# Patient Record
Sex: Female | Born: 1945 | Race: White | Hispanic: No | Smoking: Never smoker
Health system: Southern US, Community
[De-identification: ages and names within clinical notes are randomized; demographics above are authoritative.]

## PROBLEM LIST (undated history)

## (undated) DIAGNOSIS — Z9889 Other specified postprocedural states: Secondary | ICD-10-CM

## (undated) DIAGNOSIS — R42 Dizziness and giddiness: Secondary | ICD-10-CM

## (undated) DIAGNOSIS — I878 Other specified disorders of veins: Secondary | ICD-10-CM

## (undated) DIAGNOSIS — E785 Hyperlipidemia, unspecified: Secondary | ICD-10-CM

## (undated) DIAGNOSIS — R112 Nausea with vomiting, unspecified: Secondary | ICD-10-CM

## (undated) DIAGNOSIS — M1990 Unspecified osteoarthritis, unspecified site: Secondary | ICD-10-CM

## (undated) DIAGNOSIS — I10 Essential (primary) hypertension: Secondary | ICD-10-CM

## (undated) DIAGNOSIS — E039 Hypothyroidism, unspecified: Secondary | ICD-10-CM

## (undated) DIAGNOSIS — I639 Cerebral infarction, unspecified: Secondary | ICD-10-CM

## (undated) HISTORY — PX: VEIN SURGERY: SHX48

## (undated) HISTORY — PX: FINGER SURGERY: SHX640

## (undated) HISTORY — DX: Other specified disorders of veins: I87.8

## (undated) HISTORY — PX: TUBAL LIGATION: SHX77

## (undated) HISTORY — PX: CHOLECYSTECTOMY: SHX55

## (undated) HISTORY — DX: Dizziness and giddiness: R42

## (undated) HISTORY — PX: MIDDLE EAR SURGERY: SHX713

## (undated) HISTORY — PX: OTHER SURGICAL HISTORY: SHX169

## (undated) HISTORY — DX: Hyperlipidemia, unspecified: E78.5

---

## 1998-01-21 ENCOUNTER — Encounter: Admission: RE | Admit: 1998-01-21 | Discharge: 1998-04-21 | Payer: Self-pay

## 1998-10-08 ENCOUNTER — Other Ambulatory Visit: Admission: RE | Admit: 1998-10-08 | Discharge: 1998-10-08 | Payer: Self-pay | Admitting: Family Medicine

## 1998-10-09 ENCOUNTER — Encounter: Admission: RE | Admit: 1998-10-09 | Discharge: 1999-01-07 | Payer: Self-pay

## 1999-10-09 ENCOUNTER — Other Ambulatory Visit: Admission: RE | Admit: 1999-10-09 | Discharge: 1999-10-09 | Payer: Self-pay | Admitting: Family Medicine

## 2001-06-21 ENCOUNTER — Other Ambulatory Visit: Admission: RE | Admit: 2001-06-21 | Discharge: 2001-06-21 | Payer: Self-pay | Admitting: Family Medicine

## 2002-08-15 ENCOUNTER — Other Ambulatory Visit: Admission: RE | Admit: 2002-08-15 | Discharge: 2002-08-15 | Payer: Self-pay | Admitting: Family Medicine

## 2003-08-21 ENCOUNTER — Other Ambulatory Visit: Admission: RE | Admit: 2003-08-21 | Discharge: 2003-08-21 | Payer: Self-pay | Admitting: Family Medicine

## 2004-12-02 ENCOUNTER — Other Ambulatory Visit: Admission: RE | Admit: 2004-12-02 | Discharge: 2004-12-02 | Payer: Self-pay | Admitting: *Deleted

## 2006-03-26 ENCOUNTER — Other Ambulatory Visit: Admission: RE | Admit: 2006-03-26 | Discharge: 2006-03-26 | Payer: Self-pay | Admitting: Family Medicine

## 2010-04-25 DIAGNOSIS — R42 Dizziness and giddiness: Secondary | ICD-10-CM

## 2010-04-25 DIAGNOSIS — M1990 Unspecified osteoarthritis, unspecified site: Secondary | ICD-10-CM

## 2010-04-25 DIAGNOSIS — M179 Osteoarthritis of knee, unspecified: Secondary | ICD-10-CM | POA: Insufficient documentation

## 2010-04-29 ENCOUNTER — Ambulatory Visit: Payer: Self-pay | Admitting: Gastroenterology

## 2011-01-20 ENCOUNTER — Observation Stay (HOSPITAL_COMMUNITY)
Admission: EM | Admit: 2011-01-20 | Discharge: 2011-01-21 | Disposition: A | Discharge status: Home / Self Care | Payer: BC Managed Care – PPO | Source: Ambulatory Visit | Attending: Emergency Medicine | Admitting: Emergency Medicine

## 2011-01-20 ENCOUNTER — Emergency Department (HOSPITAL_COMMUNITY): Payer: BC Managed Care – PPO

## 2011-01-20 DIAGNOSIS — R079 Chest pain, unspecified: Principal | ICD-10-CM | POA: Insufficient documentation

## 2011-01-20 LAB — DIFFERENTIAL
Monocytes Absolute: 0.3 10*3/uL (ref 0.1–1.0)
Monocytes Relative: 5 % (ref 3–12)
Neutrophils Relative %: 79 % — ABNORMAL HIGH (ref 43–77)

## 2011-01-20 LAB — BASIC METABOLIC PANEL
BUN: 13 mg/dL (ref 6–23)
Calcium: 8.6 mg/dL (ref 8.4–10.5)
GFR calc non Af Amer: >60 mL/min (ref >60)
Glucose, Bld: 95 mg/dL (ref 70–99)

## 2011-01-20 LAB — CBC
HCT: 41 % (ref 36.0–46.0)
Hemoglobin: 14.1 g/dL (ref 12.0–15.0)
MCH: 28.5 pg (ref 26.0–34.0)
MCHC: 34.4 g/dL (ref 30.0–36.0)
MCV: 82.8 fL (ref 78.0–100.0)

## 2011-01-21 DIAGNOSIS — R072 Precordial pain: Secondary | ICD-10-CM

## 2011-01-21 LAB — CK TOTAL AND CKMB (NOT AT ARMC)
CK, MB: 1.8 ng/mL (ref 0.3–4.0)
CK, MB: 1.9 ng/mL (ref 0.3–4.0)
Relative Index: 1.5 (ref 0.0–2.5)
Total CK: 125 U/L (ref 7–177)
Total CK: 131 U/L (ref 7–177)

## 2011-05-18 ENCOUNTER — Other Ambulatory Visit: Payer: BC Managed Care – PPO | Admitting: Gastroenterology

## 2012-10-07 ENCOUNTER — Other Ambulatory Visit: Payer: Self-pay | Admitting: Nurse Practitioner

## 2012-10-10 ENCOUNTER — Telehealth: Payer: Self-pay | Admitting: *Deleted

## 2012-10-10 NOTE — Telephone Encounter (Signed)
LMOM, Tramadol rx ready.

## 2012-10-10 NOTE — Telephone Encounter (Signed)
LAST REFILL 08/16/12. LAST OV 9/13 WITH DFS. CALL IN TO [facility]. LAST DIRECTIONS IN CHART SAYS QD

## 2012-11-22 ENCOUNTER — Ambulatory Visit: Payer: Self-pay | Admitting: Nurse Practitioner

## 2012-12-14 ENCOUNTER — Ambulatory Visit (INDEPENDENT_AMBULATORY_CARE_PROVIDER_SITE_OTHER): Payer: BC Managed Care – PPO | Admitting: General Practice

## 2012-12-14 ENCOUNTER — Encounter: Payer: Self-pay | Admitting: General Practice

## 2012-12-14 VITALS — BP 154/71 | HR 89 | Temp 97.2°F | Ht 65.5 in | Wt 214.0 lb

## 2012-12-14 DIAGNOSIS — R030 Elevated blood-pressure reading, without diagnosis of hypertension: Secondary | ICD-10-CM

## 2012-12-14 DIAGNOSIS — Z09 Encounter for follow-up examination after completed treatment for conditions other than malignant neoplasm: Secondary | ICD-10-CM

## 2012-12-14 NOTE — Progress Notes (Signed)
  Subjective:    Patient ID: Kimberly Walsh, female    DOB: 01/23/1946, 67 y.o.   MRN: [id number]  HPI Patient presents today for blood pressure check. She reports being stressed due to her spouse is in the hospital. Her blood pressure was checked at the hospital and it was 210/90. She reported feeling dizzy at that time. She reports taking lasix daily. Reports feeling more tired and weak.  She reports numbness periodically in hands when in non dependent position. She denies discoloration. She reports taking medications as directed.    Review of Systems  Constitutional: Negative for fever and chills.  HENT: Negative for ear pain, neck pain and neck stiffness.   Respiratory: Negative for chest tightness and shortness of breath.   Cardiovascular: Positive for leg swelling. Negative for chest pain and palpitations.       Right lower leg swelling that is being treated by vein specialist in [facility]. She reports have a vein stripping two years ago and leg began swelling afterwards  Gastrointestinal: Negative for vomiting, abdominal pain, diarrhea and blood in stool.  Genitourinary: Negative for dysuria, flank pain, difficulty urinating and pelvic pain.  Musculoskeletal: Positive for back pain.       Taking mobic for musculoskeletal pain  Neurological: Negative for dizziness, weakness, numbness and headaches.       Objective:   Physical Exam  Constitutional: She is oriented to person, place, and time. She appears well-developed and well-nourished.  HENT:  Head: Normocephalic and atraumatic.  Eyes: EOM are normal.  Cardiovascular: Normal rate, regular rhythm and normal heart sounds.   Pulses:      Radial pulses are 2+ on the right side, and 2+ on the left side.       Dorsalis pedis pulses are 1+ on the right side, and 1+ on the left side.  Pulmonary/Chest: Effort normal and breath sounds normal. No respiratory distress. She exhibits no tenderness.  Abdominal: Soft. Bowel sounds are  normal. She exhibits no distension. There is no tenderness.  Neurological: She is alert and oriented to person, place, and time.  Skin: Skin is warm and dry.  Psychiatric: She has a normal mood and affect.          Assessment & Plan:  1. Blood pressure elevated - COMPLETE METABOLIC PANEL WITH GFR; Future  2. Follow-up exam, 3-6 months since previous exam - POCT CBC; Future - Vitamin D 25 hydroxy; Future - NMR Lipoprofile with Lipids; Future - Thyroid Panel With TSH; Future - Vitamin B12; Future Continue all current medications Labs pending  Keep blood pressure diary (record readings) and bring into office F/u in 3 months and sooner if symptoms worsen or develop Discussed exercise and diet  Discussed adequate rest Patient verbalized understanding Coralie Keens, FNP-C

## 2012-12-14 NOTE — Patient Instructions (Signed)

## 2012-12-15 ENCOUNTER — Other Ambulatory Visit (INDEPENDENT_AMBULATORY_CARE_PROVIDER_SITE_OTHER): Payer: BC Managed Care – PPO

## 2012-12-15 DIAGNOSIS — R030 Elevated blood-pressure reading, without diagnosis of hypertension: Secondary | ICD-10-CM

## 2012-12-15 DIAGNOSIS — Z09 Encounter for follow-up examination after completed treatment for conditions other than malignant neoplasm: Secondary | ICD-10-CM

## 2012-12-15 LAB — POCT CBC
Hemoglobin: 14.7 g/dL (ref 12.2–16.2)
Lymph, poc: 1.4 (ref 0.6–3.4)
MCHC: 35.2 g/dL (ref 31.8–35.4)
MPV: 6.7 fL (ref 0–99.8)
POC Granulocyte: 3.9 (ref 2–6.9)
Platelet Count, POC: 205 10*3/uL (ref 142–424)
RBC: 4.9 M/uL (ref 4.04–5.48)

## 2012-12-15 LAB — THYROID PANEL WITH TSH
Free Thyroxine Index: 2.2 (ref 1.0–3.9)
T3 Uptake: 34.3 % (ref 22.5–37.0)
T4, Total: 6.3 ug/dL (ref 5.0–12.5)
TSH: 4.455 u[IU]/mL (ref 0.350–4.500)

## 2012-12-15 LAB — COMPLETE METABOLIC PANEL WITH GFR
ALT: 18 U/L (ref 0–35)
AST: 18 U/L (ref 0–37)
CO2: 28 mEq/L (ref 19–32)
GFR, Est African American: 65 mL/min
Sodium: 141 mEq/L (ref 135–145)
Total Bilirubin: 0.7 mg/dL (ref 0.3–1.2)
Total Protein: 6.7 g/dL (ref 6.0–8.3)

## 2012-12-15 NOTE — Progress Notes (Unsigned)
Patient came in for labs only.

## 2012-12-16 LAB — NMR LIPOPROFILE WITH LIPIDS
Cholesterol, Total: 147 mg/dL (ref <200)
HDL Particle Number: 25.6 umol/L — ABNORMAL LOW (ref >=30.5)
HDL-C: 40 mg/dL (ref >=40)
LDL (calc): 87 mg/dL (ref <100)
LDL Particle Number: 1084 nmol/L — ABNORMAL HIGH (ref <1000)
LP-IR Score: 40 (ref <=45)
Triglycerides: 98 mg/dL (ref <150)
VLDL Size: 42.3 nm (ref <=46.6)

## 2013-01-25 ENCOUNTER — Other Ambulatory Visit: Payer: Self-pay

## 2013-01-26 DIAGNOSIS — R42 Dizziness and giddiness: Secondary | ICD-10-CM

## 2013-01-27 ENCOUNTER — Other Ambulatory Visit: Payer: Self-pay | Admitting: *Deleted

## 2013-01-27 MED ORDER — FUROSEMIDE 40 MG PO TABS: 40.0000 mg | ORAL_TABLET | Freq: Every day | ORAL | Status: DC

## 2013-01-28 ENCOUNTER — Other Ambulatory Visit: Payer: Self-pay | Admitting: Nurse Practitioner

## 2013-03-29 ENCOUNTER — Other Ambulatory Visit: Payer: Self-pay | Admitting: Nurse Practitioner

## 2013-03-31 NOTE — Telephone Encounter (Signed)
Last seen 12/14/12, last filled 10/07/12

## 2013-04-30 ENCOUNTER — Other Ambulatory Visit: Payer: Self-pay | Admitting: General Practice

## 2013-05-01 ENCOUNTER — Other Ambulatory Visit: Payer: Self-pay

## 2013-05-01 NOTE — Telephone Encounter (Signed)
Last seen 12/14/12  [name]

## 2013-05-01 NOTE — Telephone Encounter (Signed)
Last seen 12/14/12  [name]  If approved print and route to nurse

## 2013-05-09 ENCOUNTER — Encounter: Payer: Self-pay | Admitting: General Practice

## 2013-05-09 ENCOUNTER — Ambulatory Visit (INDEPENDENT_AMBULATORY_CARE_PROVIDER_SITE_OTHER): Payer: BC Managed Care – PPO | Admitting: General Practice

## 2013-05-09 VITALS — BP 185/92 | HR 90 | Temp 97.1°F | Ht 65.5 in | Wt 216.0 lb

## 2013-05-09 DIAGNOSIS — M5432 Sciatica, left side: Secondary | ICD-10-CM

## 2013-05-09 DIAGNOSIS — Z1211 Encounter for screening for malignant neoplasm of colon: Secondary | ICD-10-CM

## 2013-05-09 DIAGNOSIS — Z124 Encounter for screening for malignant neoplasm of cervix: Secondary | ICD-10-CM

## 2013-05-09 DIAGNOSIS — Z0000 Encounter for general adult medical examination without abnormal findings: Secondary | ICD-10-CM

## 2013-05-09 DIAGNOSIS — M5430 Sciatica, unspecified side: Secondary | ICD-10-CM

## 2013-05-09 DIAGNOSIS — E785 Hyperlipidemia, unspecified: Secondary | ICD-10-CM

## 2013-05-09 DIAGNOSIS — I10 Essential (primary) hypertension: Secondary | ICD-10-CM

## 2013-05-09 DIAGNOSIS — Z01419 Encounter for gynecological examination (general) (routine) without abnormal findings: Secondary | ICD-10-CM

## 2013-05-09 DIAGNOSIS — Z23 Encounter for immunization: Secondary | ICD-10-CM

## 2013-05-09 DIAGNOSIS — M1990 Unspecified osteoarthritis, unspecified site: Secondary | ICD-10-CM

## 2013-05-09 LAB — POCT CBC
Granulocyte percent: 67.8 %G (ref 37–80)
Lymph, poc: 2 (ref 0.6–3.4)
MCHC: 32.4 g/dL (ref 31.8–35.4)
MCV: 85.1 fL (ref 80–97)
MPV: 6.3 fL (ref 0–99.8)
POC Granulocyte: 4.5 (ref 2–6.9)
POC LYMPH PERCENT: 30 %L (ref 10–50)
Platelet Count, POC: 238 10*3/uL (ref 142–424)
RBC: 5.3 M/uL (ref 4.04–5.48)
RDW, POC: 13.6 %
WBC: 6.6 10*3/uL (ref 4.6–10.2)

## 2013-05-09 MED ORDER — METHYLPREDNISOLONE ACETATE 80 MG/ML IJ SUSP
80.0000 mg | Freq: Once | INTRAMUSCULAR | Status: AC
  Administered 2013-05-09: 80 mg via INTRAMUSCULAR

## 2013-05-09 MED ORDER — DICLOFENAC SODIUM 1 % TD GEL: 2.0000 g | Freq: Four times a day (QID) | TRANSDERMAL | Status: DC

## 2013-05-09 MED ORDER — TRAMADOL HCL 50 MG PO TABS: 50.0000 mg | ORAL_TABLET | Freq: Two times a day (BID) | ORAL | Status: DC | PRN

## 2013-05-09 MED ORDER — LISINOPRIL 10 MG PO TABS: 10.0000 mg | ORAL_TABLET | Freq: Every day | ORAL | Status: DC

## 2013-05-09 NOTE — Progress Notes (Signed)
Subjective:    Patient ID: Kimberly Walsh, female    DOB: 10/03/1945, 67 y.o.   MRN: [id number]  HPI Patient presents today for annual physical. She reports she has arthritis and was seen a doctor in [place], but needs a referral. She also reports having pain in left hip that radiates to left thigh. She also reports being seen by an ortho two years ago. Reports having mammogram scheduled and would like to schedule colonoscopy.  Patient's blood pressure has been elevated for last two office visits and she denies taking anti hypertensive medications. Kimberly Walsh is having left hip pain in left hip, that radiates down to left thigh.    Review of Systems  Constitutional: Negative for fever and chills.  Respiratory: Negative for chest tightness and shortness of breath.   Cardiovascular: Negative for chest pain and palpitations.  Gastrointestinal: Negative for nausea, vomiting, abdominal pain, diarrhea, constipation and blood in stool.  Genitourinary: Negative for dysuria and difficulty urinating.  Musculoskeletal:       Left hip pain that runs to thigh Arthritic pain in hands  Neurological: Negative for dizziness, weakness and headaches.       Objective:   Physical Exam  Constitutional: She is oriented to person, place, and time. She appears well-developed and well-nourished.  HENT:  Head: Normocephalic and atraumatic.  Right Ear: External ear normal.  Left Ear: External ear normal.  Mouth/Throat: Oropharynx is clear and moist.  Eyes: Conjunctivae and EOM are normal. Pupils are equal, round, and reactive to light.  Neck: Normal range of motion. Neck supple. No thyromegaly present.  Cardiovascular: Normal rate, regular rhythm, normal heart sounds and intact distal pulses.   Pulmonary/Chest: Effort normal and breath sounds normal. No respiratory distress. Right breast exhibits no inverted nipple, no mass, no nipple discharge, no skin change and no tenderness. Left breast exhibits no  inverted nipple, no mass, no nipple discharge, no skin change and no tenderness. Breasts are symmetrical.  Abdominal: Soft. Bowel sounds are normal. She exhibits no distension.  Genitourinary: Vagina normal and uterus normal. No breast swelling, tenderness, discharge or bleeding. No labial fusion. There is no rash, tenderness, lesion or injury on the right labia. There is no rash, tenderness, lesion or injury on the left labia. Uterus is not deviated, not enlarged, not fixed and not tender. Cervix exhibits no motion tenderness, no discharge and no friability. Right adnexum displays no mass, no tenderness and no fullness. Left adnexum displays no mass, no tenderness and no fullness. No erythema, tenderness or bleeding around the vagina. No foreign body around the vagina. No signs of injury around the vagina. No vaginal discharge found.  Musculoskeletal: She exhibits tenderness.  Tenderness to left low back and left hip with palpation Knuckle subluxation to bilateral hands  Lymphadenopathy:    She has no cervical adenopathy.  Neurological: She is alert and oriented to person, place, and time.  Skin: Skin is warm and dry.  Psychiatric: She has a normal mood and affect.          Assessment & Plan:  1. Need for prophylactic vaccination against Streptococcus pneumoniae (pneumococcus)  - Pneumococcal conjugate vaccine 13-valent IM  2. Annual physical exam  - POCT CBC - Pap IG w/ reflex to HPV when ASC-U  3. Hyperlipidemia  - NMR, lipoprofile  4. Hypertension  - CMP14+EGFR - lisinopril (PRINIVIL,ZESTRIL) 10 MG tablet; Take 1 tablet (10 mg total) by mouth daily.  Dispense: 30 tablet; Refill: 1  5. Sciatica, left  - methylPREDNISolone  acetate (DEPO-MEDROL) injection 80 mg; Inject 1 mL (80 mg total) into the muscle once. - Ambulatory referral to Orthopedic Surgery  6. Arthritis  - traMADol (ULTRAM) 50 MG tablet; Take 1 tablet (50 mg total) by mouth every 12 (twelve) hours as needed.   Dispense: 60 tablet; Refill: 0  7. Osteoarthritis, hand, unspecified laterality  - diclofenac sodium (VOLTAREN) 1 % GEL; Apply 2 g topically 4 (four) times daily.  Dispense: 1 Tube; Refill: 3  8. Colon cancer screening  - Ambulatory referral to Gastroenterology -Continue all current medications Labs pending F/u in 2 weeks for bp recheck Discussed exercise and diet  RTO if symptoms worsen  Patient verbalized understanding Coralie Keens, FNP-C

## 2013-05-09 NOTE — Patient Instructions (Addendum)
Pap Test A Pap test is a procedure done in a clinic office to evaluate cells that are on the surface of the cervix. The cervix is the lower portion of the uterus and upper portion of the vagina. For some women, the cervical region has the potential to form cancer. With consistent evaluations by your caregiver, this type of cancer can be prevented.  If a Pap test is abnormal, it is most often a result of a previous exposure to human papillomavirus (HPV). HPV is a virus that can infect the cells of the cervix and cause dysplasia. Dysplasia is where the cells no longer look normal. If a woman has been diagnosed with high-grade or severe dysplasia, they are at higher risk of developing cervical cancer. People diagnosed with low-grade dysplasia should still be seen by their caregiver because there is a small chance that low-grade dysplasia could develop into cancer.  LET YOUR CAREGIVER KNOW ABOUT:  Recent sexually transmitted infection (STI) you have had.  Any new sex partners you have had.  History of previous abnormal Pap tests results.  History of previous cervical procedures you have had (colposcopy, biopsy, loop electrosurgical excision procedure [LEEP]).  Concerns you have had regarding unusual vaginal discharge.  History of pelvic pain.  Your use of birth control. BEFORE THE PROCEDURE  Ask your caregiver when to schedule your Pap test. It is best not to be on your period if your caregiver uses a wooden spatula to collect cells or applies cells to a glass slide. Newer techniques are not so sensitive to the timing of a menstrual cycle.  Do not douche or have sexual intercourse for 24 hours before the test.   Do not use vaginal creams or tampons for 24 hours before the test.   Empty your bladder just before the test to lessen any discomfort.  PROCEDURE You will lie on an exam table with your feet in stirrups. A warm metal or plastic instrument (speculum) is placed in your vagina. This  instrument allows your caregiver to see the inside of your vagina and look at your cervix. A small, plastic brush or wooden spatula is then used to collect cervical cells. These cells are placed in a lab specimen container. The cells are looked at under a microscope. A specialist will determine if the cells are normal.  AFTER THE PROCEDURE Make sure to get your test results.If your results come back abnormal, you may need further testing.  Document Released: 08/29/2002 Document Revised: 08/31/2011 Document Reviewed: 06/04/2011 [facility] Patient Information 2014 [place], [place]. Pneumococcal Vaccine, Polyvalent suspension for injection What is this medicine? PNEUMOCOCCAL VACCINE, POLYVALENT (NEU mo KOK al vak SEEN, pol ee VEY luhnt) is a vaccine to prevent pneumococcus bacteria infection. These bacteria are a major cause of ear infections, 'Strep throat' infections, and serious pneumonia, meningitis, or blood infections worldwide. These vaccines help the body to produce antibodies (protective substances) that help your body defend against these bacteria. This vaccine is recommended for infants and young children. This vaccine will not treat an infection. This medicine may be used for other purposes; ask your health care provider or pharmacist if you have questions. COMMON BRAND NAME(S): Prevnar 13 , Prevnar What should I tell my health care provider before I take this medicine? They need to know if you have any of these conditions: -bleeding problems -fever -immune system problems -low platelet count in the blood -seizures -an unusual or allergic reaction to pneumococcal vaccine, diphtheria toxoid, other vaccines, latex, other medicines, foods,  dyes, or preservatives -pregnant or trying to get pregnant -breast-feeding How should I use this medicine? This vaccine is for injection into a muscle. It is given by a health care professional. A copy of Vaccine Information Statements will be given  before each vaccination. Read this sheet carefully each time. The sheet may change frequently. Talk to your pediatrician regarding the use of this medicine in children. While this drug may be prescribed for children as young as 64 weeks old for selected conditions, precautions do apply. Overdosage: If you think you have taken too much of this medicine contact a poison control center or emergency room at once. NOTE: This medicine is only for you. Do not share this medicine with others. What if I miss a dose? It is important not to miss your dose. Call your doctor or health care professional if you are unable to keep an appointment. What may interact with this medicine? -medicines for cancer chemotherapy -medicines that suppress your immune function -medicines that treat or prevent blood clots like warfarin, enoxaparin, and dalteparin -steroid medicines like prednisone or cortisone This list may not describe all possible interactions. Give your health care provider a list of all the medicines, herbs, non-prescription drugs, or dietary supplements you use. Also tell them if you smoke, drink alcohol, or use illegal drugs. Some items may interact with your medicine. What should I watch for while using this medicine? Mild fever and pain should go away in 3 days or less. Report any unusual symptoms to your doctor or health care professional. What side effects may I notice from receiving this medicine? Side effects that you should report to your doctor or health care professional as soon as possible: -allergic reactions like skin rash, itching or hives, swelling of the face, lips, or tongue -breathing problems -confused -fever over 102 degrees F -pain, tingling, numbness in the hands or feet -seizures -unusual bleeding or bruising -unusual muscle weakness Side effects that usually do not require medical attention (report to your doctor or health care professional if they continue or are  bothersome): -aches and pains -diarrhea -fever of 102 degrees F or less -headache -irritable -loss of appetite -pain, tender at site where injected -trouble sleeping This list may not describe all possible side effects. Call your doctor for medical advice about side effects. You may report side effects to FDA at 1-800-FDA-1088. Where should I keep my medicine? This does not apply. This vaccine is given in a clinic, pharmacy, doctor's office, or other health care setting and will not be stored at home. NOTE: This sheet is a summary. It may not cover all possible information. If you have questions about this medicine, talk to your doctor, pharmacist, or health care provider.  2014, Elsevier/Gold Standard. (2008-08-21 10:17:22)

## 2013-05-10 LAB — CMP14+EGFR
ALT: 27 IU/L (ref 0–32)
AST: 19 IU/L (ref 0–40)
Albumin/Globulin Ratio: 1.3 (ref 1.1–2.5)
BUN/Creatinine Ratio: 16 (ref 11–26)
BUN: 14 mg/dL (ref 8–27)
Calcium: 9.4 mg/dL (ref 8.6–10.2)
Chloride: 103 mmol/L (ref 97–108)
Creatinine, Ser: 0.88 mg/dL (ref 0.57–1.00)
GFR calc Af Amer: 79 mL/min/{1.73_m2} (ref >59)
GFR calc non Af Amer: 69 mL/min/{1.73_m2} (ref >59)
Globulin, Total: 3.1 g/dL (ref 1.5–4.5)
Glucose: 100 mg/dL — ABNORMAL HIGH (ref 65–99)
Potassium: 4 mmol/L (ref 3.5–5.2)
Sodium: 141 mmol/L (ref 134–144)
Total Bilirubin: 0.5 mg/dL (ref 0.0–1.2)
Total Protein: 7 g/dL (ref 6.0–8.5)

## 2013-05-10 LAB — NMR, LIPOPROFILE
HDL Cholesterol by NMR: 38 mg/dL — ABNORMAL LOW (ref >=40)
LDL Size: 20.3 nm — ABNORMAL LOW (ref >20.5)
LDLC SERPL CALC-MCNC: 85 mg/dL (ref <100)
Small LDL Particle Number: 909 nmol/L — ABNORMAL HIGH (ref <=527)

## 2013-05-12 LAB — PAP IG W/ RFLX HPV ASCU: PAP Smear Comment: 0

## 2013-05-16 ENCOUNTER — Other Ambulatory Visit: Payer: Self-pay | Admitting: General Practice

## 2013-05-16 DIAGNOSIS — E785 Hyperlipidemia, unspecified: Secondary | ICD-10-CM

## 2013-05-16 MED ORDER — ATORVASTATIN CALCIUM 10 MG PO TABS: 10.0000 mg | ORAL_TABLET | Freq: Every day | ORAL | Status: DC

## 2013-05-23 ENCOUNTER — Encounter: Payer: Self-pay | Admitting: General Practice

## 2013-05-23 ENCOUNTER — Telehealth: Payer: Self-pay | Admitting: *Deleted

## 2013-05-23 ENCOUNTER — Ambulatory Visit (INDEPENDENT_AMBULATORY_CARE_PROVIDER_SITE_OTHER): Payer: BC Managed Care – PPO | Admitting: General Practice

## 2013-05-23 VITALS — BP 157/74 | HR 91 | Temp 97.3°F | Ht 65.5 in | Wt 211.0 lb

## 2013-05-23 DIAGNOSIS — N760 Acute vaginitis: Secondary | ICD-10-CM

## 2013-05-23 DIAGNOSIS — Z23 Encounter for immunization: Secondary | ICD-10-CM

## 2013-05-23 DIAGNOSIS — M129 Arthropathy, unspecified: Secondary | ICD-10-CM

## 2013-05-23 DIAGNOSIS — M1990 Unspecified osteoarthritis, unspecified site: Secondary | ICD-10-CM

## 2013-05-23 DIAGNOSIS — I10 Essential (primary) hypertension: Secondary | ICD-10-CM

## 2013-05-23 MED ORDER — NEBIVOLOL HCL 10 MG PO TABS: 10.0000 mg | ORAL_TABLET | Freq: Every day | ORAL | Status: DC

## 2013-05-23 MED ORDER — MICONAZOLE NITRATE 2 % VA CREA: 1.0000 | TOPICAL_CREAM | Freq: Every day | VAGINAL | Status: AC

## 2013-05-23 NOTE — Patient Instructions (Signed)

## 2013-05-23 NOTE — Progress Notes (Signed)
   Subjective:    Patient ID: SHARONLEE NINE, female    DOB: 04/27/1946, 67 y.o.   MRN: [id number]  HPI Patient presents today for follow up on blood pressure. Reports lisinopril made her cough. Denies checking blood pressure at home. Patient would like to be referred to a rheumatologist for arthritis in hands.     Review of Systems  Constitutional: Negative for fever and chills.  Respiratory: Negative for chest tightness and shortness of breath.   Cardiovascular: Negative for chest pain and palpitations.  Gastrointestinal: Negative for nausea, vomiting, abdominal pain, diarrhea, constipation and blood in stool.  Genitourinary: Negative for dysuria and difficulty urinating.  Neurological: Negative for dizziness, weakness and headaches.       Objective:   Physical Exam  Constitutional: She is oriented to person, place, and time. She appears well-developed and well-nourished.  Cardiovascular: Normal rate, regular rhythm and normal heart sounds.   Pulmonary/Chest: Effort normal and breath sounds normal.  Neurological: She is alert and oriented to person, place, and time.  Skin: Skin is warm and dry.  Psychiatric: She has a normal mood and affect.          Assessment & Plan:  1. Arthritis -rheumatologist referal  2. Hypertension -bystolic 10 mg daily (lot # [id number], 14 tablets) -discontinue lisinopril -RTO in 1 week for blood pressure recheck  3. Vaginitis and vulvovaginitis -monistat 7 (2%), apply vaginal every night for 7 nights -RTO if symptoms worsen or unresolved -Patient verbalized understanding Coralie Keens, FNP-C

## 2013-05-23 NOTE — Telephone Encounter (Signed)
Pt aware of lab results at her office visit.

## 2013-06-01 ENCOUNTER — Other Ambulatory Visit: Payer: Self-pay | Admitting: General Practice

## 2013-06-06 ENCOUNTER — Ambulatory Visit (INDEPENDENT_AMBULATORY_CARE_PROVIDER_SITE_OTHER): Payer: BC Managed Care – PPO | Admitting: Family Medicine

## 2013-06-06 ENCOUNTER — Telehealth: Payer: Self-pay | Admitting: Family Medicine

## 2013-06-06 VITALS — BP 123/64 | HR 61 | Temp 97.4°F | Ht 66.0 in | Wt 214.0 lb

## 2013-06-06 DIAGNOSIS — R252 Cramp and spasm: Secondary | ICD-10-CM

## 2013-06-06 DIAGNOSIS — J069 Acute upper respiratory infection, unspecified: Secondary | ICD-10-CM

## 2013-06-06 DIAGNOSIS — I10 Essential (primary) hypertension: Secondary | ICD-10-CM

## 2013-06-06 MED ORDER — BENZONATATE 100 MG PO CAPS: 100.0000 mg | ORAL_CAPSULE | Freq: Three times a day (TID) | ORAL | Status: DC | PRN

## 2013-06-06 MED ORDER — NEBIVOLOL HCL 10 MG PO TABS: 10.0000 mg | ORAL_TABLET | Freq: Every day | ORAL | Status: DC

## 2013-06-06 MED ORDER — AZITHROMYCIN 250 MG PO TABS: ORAL_TABLET | ORAL | Status: DC

## 2013-06-06 MED ORDER — CARBIDOPA-LEVODOPA ER 50-200 MG PO TBCR: 1.0000 | EXTENDED_RELEASE_TABLET | Freq: Two times a day (BID) | ORAL | Status: DC

## 2013-06-06 NOTE — Progress Notes (Signed)
   Subjective:    Patient ID: Kimberly Walsh, female    DOB: September 16, 1945, 67 y.o.   MRN: [id number]  HPI This 67 y.o. female presents for evaluation of c/o cramps in LE's at hs and URI sx's. She recently was placed on bystolic for hypertension and she is tolerating it well.   Review of Systems    No chest pain, SOB, HA, dizziness, vision change, N/V, diarrhea, constipation, dysuria, urinary urgency or frequency, myalgias, arthralgias or rash.  Objective:   Physical Exam  Vital signs noted  Well developed well nourished female.  HEENT - Head atraumatic Normocephalic                Eyes - PERRLA, Conjuctiva - clear Sclera- Clear EOMI                Ears - EAC's Wnl TM's Wnl Gross Hearing WNL                Nose - Nares patent                 Throat - oropharanx wnl Respiratory - Lungs CTA bilateral Cardiac - RRR S1 and S2 without murmur GI - Abdomen soft Nontender and bowel sounds active x 4 Extremities - No edema. Neuro - Grossly intact.      Assessment & Plan:  Hypertension - Plan: nebivolol (BYSTOLIC) 10 MG tablet  Cramps, extremity - Plan: carbidopa-levodopa (SINEMET CR) 50-200 MG per tablet  URI, acute - Plan: azithromycin (ZITHROMAX) 250 MG tablet, benzonatate (TESSALON PERLES) 100 MG capsule   Deatra Canter FNP

## 2013-06-06 NOTE — Progress Notes (Signed)
Returned for b/p rck 123/64, P- 61. Pt still continues with cough. Cough no better since switching B/P med. Now leg cramps since starting new Bystolic. Please advise.

## 2013-06-07 ENCOUNTER — Other Ambulatory Visit: Payer: Self-pay | Admitting: Family Medicine

## 2013-06-07 NOTE — Telephone Encounter (Signed)
She doesn't have a cough due to bp medicine.  She has a cough due to her URI and I don't want to switch. Follow up if she still has questions

## 2013-06-20 ENCOUNTER — Telehealth: Payer: Self-pay | Admitting: Family Medicine

## 2013-06-20 NOTE — Telephone Encounter (Signed)
error 

## 2013-06-21 NOTE — Telephone Encounter (Signed)
Did you mean to call in Sinimet for her? Please advise and contact pharmacy to clarify

## 2013-06-26 ENCOUNTER — Other Ambulatory Visit: Payer: Self-pay

## 2013-06-26 DIAGNOSIS — M1990 Unspecified osteoarthritis, unspecified site: Secondary | ICD-10-CM

## 2013-06-26 NOTE — Telephone Encounter (Signed)
Last seen 06/06/13  [facility]  If approved print and route to nurse

## 2013-06-27 ENCOUNTER — Other Ambulatory Visit: Payer: Self-pay | Admitting: Family Medicine

## 2013-06-27 ENCOUNTER — Other Ambulatory Visit: Payer: Self-pay | Admitting: General Practice

## 2013-06-27 ENCOUNTER — Telehealth: Payer: Self-pay | Admitting: General Practice

## 2013-06-27 DIAGNOSIS — R252 Cramp and spasm: Secondary | ICD-10-CM

## 2013-06-27 DIAGNOSIS — I10 Essential (primary) hypertension: Secondary | ICD-10-CM

## 2013-06-27 MED ORDER — TRAMADOL HCL 50 MG PO TABS: 50.0000 mg | ORAL_TABLET | Freq: Two times a day (BID) | ORAL | Status: DC | PRN

## 2013-06-27 MED ORDER — CARBIDOPA-LEVODOPA ER 50-200 MG PO TBCR: EXTENDED_RELEASE_TABLET | ORAL | Status: DC

## 2013-06-27 MED ORDER — NEBIVOLOL HCL 10 MG PO TABS: 10.0000 mg | ORAL_TABLET | Freq: Every day | ORAL | Status: DC

## 2013-06-27 NOTE — Telephone Encounter (Signed)
bystolic script sent to pharmacy

## 2013-07-06 ENCOUNTER — Encounter: Payer: Self-pay | Admitting: Family Medicine

## 2013-08-03 ENCOUNTER — Other Ambulatory Visit: Payer: Self-pay | Admitting: Family Medicine

## 2013-08-04 NOTE — Telephone Encounter (Signed)
Please advise 

## 2013-08-24 ENCOUNTER — Other Ambulatory Visit: Payer: Self-pay | Admitting: Family Medicine

## 2013-08-24 NOTE — Telephone Encounter (Signed)
Last seen 06/06/13  [facility]

## 2013-08-24 NOTE — Telephone Encounter (Signed)
Last seen 06/06/13  [facility]  If approved print and route to nurse

## 2013-09-01 ENCOUNTER — Telehealth: Payer: Self-pay | Admitting: Family Medicine

## 2013-09-04 ENCOUNTER — Telehealth: Payer: Self-pay | Admitting: General Practice

## 2013-09-04 NOTE — Telephone Encounter (Signed)
error 

## 2013-09-06 ENCOUNTER — Ambulatory Visit: Payer: BC Managed Care – PPO | Admitting: Family Medicine

## 2013-09-06 ENCOUNTER — Ambulatory Visit (INDEPENDENT_AMBULATORY_CARE_PROVIDER_SITE_OTHER): Payer: BC Managed Care – PPO | Admitting: General Practice

## 2013-09-06 ENCOUNTER — Ambulatory Visit (INDEPENDENT_AMBULATORY_CARE_PROVIDER_SITE_OTHER): Payer: BC Managed Care – PPO

## 2013-09-06 ENCOUNTER — Encounter: Payer: Self-pay | Admitting: General Practice

## 2013-09-06 VITALS — BP 162/76 | HR 84 | Temp 97.5°F | Ht 66.0 in | Wt 220.2 lb

## 2013-09-06 DIAGNOSIS — M129 Arthropathy, unspecified: Secondary | ICD-10-CM

## 2013-09-06 DIAGNOSIS — M79641 Pain in right hand: Secondary | ICD-10-CM

## 2013-09-06 DIAGNOSIS — M79642 Pain in left hand: Secondary | ICD-10-CM

## 2013-09-06 DIAGNOSIS — M542 Cervicalgia: Secondary | ICD-10-CM

## 2013-09-06 DIAGNOSIS — M79609 Pain in unspecified limb: Secondary | ICD-10-CM

## 2013-09-06 DIAGNOSIS — I10 Essential (primary) hypertension: Secondary | ICD-10-CM

## 2013-09-06 DIAGNOSIS — M1990 Unspecified osteoarthritis, unspecified site: Secondary | ICD-10-CM

## 2013-09-06 DIAGNOSIS — Z8489 Family history of other specified conditions: Secondary | ICD-10-CM

## 2013-09-06 MED ORDER — NEBIVOLOL HCL 10 MG PO TABS
10.0000 mg | ORAL_TABLET | Freq: Every day | ORAL | Status: DC
Start: 2013-09-06 — End: 2014-02-04

## 2013-09-06 MED ORDER — TRAMADOL HCL 50 MG PO TABS: 50.0000 mg | ORAL_TABLET | Freq: Every day | ORAL | Status: DC | PRN

## 2013-09-06 NOTE — Patient Instructions (Signed)
Headache and Arthritis °Headaches and arthritis are common problems. This causes an interest in the possible role of arthritis in causing headaches. Several major forms of arthritis exist. Two of the most common types are: °· Rheumatoid arthritis. °· Osteoarthritis. °Rheumatoid arthritis may begin at any age. It is a condition in which the body attacks some of its own tissues, thinking they do not belong. This leads to destruction of the bony areas around the joints. This condition may afflict any of the body's joints. It usually produces a deformity of the joint. The hands and fingers no longer appear straight but often appear angled towards one side. In some cases, the spine may be involved. Most often it is the vertebrae of the neck (cervicalspine). The areas of the neck most commonly afflicted by rheumatoid arthritis are the first and second cervical vertebrae. Curiously, rheumatoid arthritis, though it often produces severe deformities, is not always painful.  °The more common form of arthritis is osteoarthritis. It is a wear-and-tear form of arthritis. It usually does not produce deformity of the joints or destruction of the bony tissues. Rather the ligaments weaken. They may be calcified due to the body's attempt to heal the damage. The larger joints of the body and those joints that take the most stress and strain are the most often affected. In the neck region this osteoarthritis usually involves the fifth, sixth and seventh vertebrae. This is because the effects of posture produce the most fatigue on them. Osteoarthritis is often more painful than rheumatoid arthritis.  °During workups for arthritis, a test evaluating inflammation, (the sedimentation rate) often is performed. In rheumatoid arthritis, this test will usually be elevated. Other tests for inflammation may also be elevated. In patients with osteoarthritis, x-rays of the neck or jaw joints will show changes from "lipping" of the vertebrae. This  is caused by calcium deposits in the ligaments. Or they may show narrowing of the space between the vertebrae, or spur formation (from calcium deposits). If severe, it may cause obstruction of the holes where the nerves pass from the spine to the body. In rheumatoid arthritis, dislocation of vertebrae may occur in the upper neck. CT scan and MRI in patients with osteoarthritis may show bulging of the discs that cushion the vertebrae. In the most severe cases, herniation of the discs may occur.  °Headaches, felt as a pain in the neck, may be caused by arthritis if the first, second or third vertebrae are involved. This condition is due to the nerves that supply the scalp only originating from this area of the spine. Neck pain itself, whether alone or coupled with headaches, can involve any portion of the neck. If the jaw is involved, the symptoms are similar to those of Temporomandibular Joint Syndrome (TMJ).  °The progressive severity of rheumatoid arthritis may be slowed by a variety of potent medications. In osteoarthritis, its progression is not usually hindered by medication. The following may be helpful in slowing the advancement of the disorder: °· Lifestyle adjustment. °· Exercise. °· Rest. °· Weight loss. °Medications, such as the nonsteroidal anti-inflammatory agents (NSAIDs), are useful. They may reduce the pain and improve the reduced motion which occurs in joints afflicted by arthritis. From some studies, the use of acetaminophen appears to be as effective in controlling the pain of arthritis as the NSAIDs. Physical modalities may also be useful for arthritis. They include: °· Heat. °· Massage. °· Exercise. °But physical therapy must be prescribed by a caregiver, just as most medications for   arthritis.  °Document Released: 08/29/2003 Document Revised: 08/31/2011 Document Reviewed: 01/26/2008 °[employer]® Patient Information ©2014 [employer]. ° °

## 2013-09-08 NOTE — Progress Notes (Signed)
   Subjective:    Patient ID: Kimberly Walsh, female    DOB: 12/17/1945, 68 y.o.   MRN: [id number]  HPI Patient presents today with complaints of neck pain. She has a history of arthritis. Denies any trauma or injury.     Review of Systems  Constitutional: Negative for fever and chills.  Respiratory: Negative for chest tightness and shortness of breath.   Cardiovascular: Negative for chest pain and palpitations.  Musculoskeletal:       Neck pain       Objective:   Physical Exam  Constitutional: She is oriented to person, place, and time. She appears well-developed and well-nourished.  HENT:  Right Ear: External ear normal.  Left Ear: External ear normal.  Nose: Nose normal.  Mouth/Throat: Oropharynx is clear and moist.  Neck: No thyromegaly present.  Cardiovascular: Normal rate, regular rhythm and normal heart sounds.   Pulmonary/Chest: Effort normal and breath sounds normal. No respiratory distress. She exhibits no tenderness.  Musculoskeletal: She exhibits no edema and no tenderness.  Limited range of motion upon rotating neck to left.   Lymphadenopathy:    She has no cervical adenopathy.  Neurological: She is alert and oriented to person, place, and time.  Skin: Skin is warm and dry.  Psychiatric: She has a normal mood and affect.      [facility] reading (PRIMARY) by Erby Pian, FNP-C, degenerative disc disease.     Assessment & Plan:  1. Family history of headaches   2. Neck pain  - DG Cervical Spine Complete; Future - Ambulatory referral to Orthopedic Surgery  3. Arthritis   4. Bilateral hand pain  - traMADol (ULTRAM) 50 MG tablet; Take 1 tablet (50 mg total) by mouth daily as needed.  Dispense: 30 tablet; Refill: 0  5. Hypertension  - nebivolol (BYSTOLIC) 10 MG tablet; Take 1 tablet (10 mg total) by mouth daily.  Dispense: 30 tablet; Refill: 4  6. DJD (degenerative joint disease) - Ambulatory referral to Orthopedic Surgery -continue medications  as prescribed RTO if symptoms worsen or unresolved Patient verbalized understanding Erby Pian, FNP-C

## 2013-09-11 ENCOUNTER — Ambulatory Visit: Payer: BC Managed Care – PPO | Admitting: General Practice

## 2013-09-11 ENCOUNTER — Other Ambulatory Visit: Payer: Self-pay | Admitting: Nurse Practitioner

## 2013-09-12 NOTE — Telephone Encounter (Signed)
Please review. Pt on Voltaren gel per epic.

## 2013-09-16 ENCOUNTER — Other Ambulatory Visit: Payer: Self-pay | Admitting: General Practice

## 2013-09-18 ENCOUNTER — Other Ambulatory Visit: Payer: Self-pay | Admitting: *Deleted

## 2013-09-18 MED ORDER — MELOXICAM 15 MG PO TABS: ORAL_TABLET | ORAL | Status: DC

## 2013-09-28 ENCOUNTER — Telehealth: Payer: Self-pay | Admitting: General Practice

## 2013-09-28 NOTE — Telephone Encounter (Signed)
Appt scheduled for 4/27 with mae

## 2013-10-02 ENCOUNTER — Encounter: Payer: Self-pay | Admitting: Family Medicine

## 2013-10-02 ENCOUNTER — Ambulatory Visit (INDEPENDENT_AMBULATORY_CARE_PROVIDER_SITE_OTHER): Payer: BC Managed Care – PPO

## 2013-10-02 ENCOUNTER — Ambulatory Visit (INDEPENDENT_AMBULATORY_CARE_PROVIDER_SITE_OTHER): Payer: BC Managed Care – PPO | Admitting: Family Medicine

## 2013-10-02 ENCOUNTER — Ambulatory Visit (HOSPITAL_COMMUNITY)
Admission: RE | Admit: 2013-10-02 | Discharge: 2013-10-02 | Disposition: A | Discharge status: Home / Self Care | Payer: BC Managed Care – PPO | Source: Ambulatory Visit | Attending: Family Medicine | Admitting: Family Medicine

## 2013-10-02 VITALS — BP 140/64 | HR 60 | Temp 99.0°F | Ht 66.0 in | Wt 220.6 lb

## 2013-10-02 DIAGNOSIS — M129 Arthropathy, unspecified: Secondary | ICD-10-CM

## 2013-10-02 DIAGNOSIS — R05 Cough: Secondary | ICD-10-CM | POA: Insufficient documentation

## 2013-10-02 DIAGNOSIS — R0609 Other forms of dyspnea: Secondary | ICD-10-CM | POA: Insufficient documentation

## 2013-10-02 DIAGNOSIS — R609 Edema, unspecified: Secondary | ICD-10-CM | POA: Insufficient documentation

## 2013-10-02 DIAGNOSIS — M1990 Unspecified osteoarthritis, unspecified site: Secondary | ICD-10-CM

## 2013-10-02 DIAGNOSIS — R0600 Dyspnea, unspecified: Secondary | ICD-10-CM

## 2013-10-02 DIAGNOSIS — R5381 Other malaise: Secondary | ICD-10-CM

## 2013-10-02 DIAGNOSIS — R059 Cough, unspecified: Secondary | ICD-10-CM | POA: Insufficient documentation

## 2013-10-02 DIAGNOSIS — R0989 Other specified symptoms and signs involving the circulatory and respiratory systems: Secondary | ICD-10-CM

## 2013-10-02 DIAGNOSIS — R5383 Other fatigue: Secondary | ICD-10-CM | POA: Insufficient documentation

## 2013-10-02 DIAGNOSIS — I517 Cardiomegaly: Secondary | ICD-10-CM | POA: Insufficient documentation

## 2013-10-02 LAB — POCT CBC
Granulocyte percent: 71 %G (ref 37–80)
HCT, POC: 45.6 % (ref 37.7–47.9)
Hemoglobin: 14.5 g/dL (ref 12.2–16.2)
Lymph, poc: 2.2 (ref 0.6–3.4)
MCH, POC: 28 pg (ref 27–31.2)
MCHC: 31.8 g/dL (ref 31.8–35.4)
MCV: 88.1 fL (ref 80–97)
MPV: 7.2 fL (ref 0–99.8)
POC Granulocyte: 5.8 (ref 2–6.9)
POC LYMPH PERCENT: 26.6 %L (ref 10–50)
Platelet Count, POC: 222 10*3/uL (ref 142–424)
RBC: 5.2 M/uL (ref 4.04–5.48)
RDW, POC: 14 %
WBC: 8.2 10*3/uL (ref 4.6–10.2)

## 2013-10-02 MED ORDER — IOHEXOL 350 MG/ML SOLN
100.0000 mL | Freq: Once | INTRAVENOUS | Status: AC | PRN
  Administered 2013-10-02: 80 mL via INTRAVENOUS

## 2013-10-02 NOTE — Progress Notes (Signed)
Patient ID: Kimberly Walsh, female   DOB: [birth year], 68 y.o.   MRN: [id number] SUBJECTIVE: CC: Chief Complaint  Patient presents with  . Acute Visit    feels like needs to  be in hospital, states saw [name] had xr neck  and headache since Nov  and was told by [name] to see orthropeic and has appt In May  did not want to go to [place]. also says gets SOB  . Results    wants results of XR    HPI:  Has had coughing and generalized weak feelings for months now since starting on new BP medications. Wants the results of her xray of her neck. Denies chest pain. No wheezing. Gets Short winded at times.  Goes to the [facility] of [place] for venous insufficiency of the right leg. Just doesn't feel well. Short winded at rest at times . No history of coagulopathy in family.   Past Medical History  Diagnosis Date  . Hyperlipidemia   . Venous stasis   . Vertigo    Past Surgical History  Procedure Laterality Date  . Middle ear surgery      Fungus  . Finger surgery Right     Ring finger  . Cholecystectomy    . Tubal ligation    . Vein surgery     History   Social History  . Marital Status: Single    Spouse Name: N/A    Number of Children: N/A  . Years of Education: N/A   Occupational History  . Not on file.   Social History Main Topics  . Smoking status: Never Smoker   . Smokeless tobacco: Not on file  . Alcohol Use: No  . Drug Use: No  . Sexual Activity: Not on file   Other Topics Concern  . Not on file   Social History Narrative  . No narrative on file   Family History  Problem Relation Age of Onset  . COPD Father    Current Outpatient Prescriptions on File Prior to Visit  Medication Sig Dispense Refill  . atorvastatin (LIPITOR) 10 MG tablet Take 1 tablet (10 mg total) by mouth daily.  30 tablet  3  . diclofenac sodium (VOLTAREN) 1 % GEL Apply 2 g topically 4 (four) times daily.  1 Tube  3  . furosemide (LASIX) 40 MG tablet       . meclizine (ANTIVERT) 25 MG  tablet TAKE ONE TAB EVERY 6 HOURS AS NEEDED FOR VERTIGO  60 tablet  2  . meloxicam (MOBIC) 15 MG tablet TAKE ONE TABLET BY MOUTH ONE TIME DAILY  30 tablet  2  . nebivolol (BYSTOLIC) 10 MG tablet Take 1 tablet (10 mg total) by mouth daily.  30 tablet  4  . traMADol (ULTRAM) 50 MG tablet Take 1 tablet (50 mg total) by mouth daily as needed.  30 tablet  0   No current facility-administered medications on file prior to visit.   No Known Allergies Immunization History  Administered Date(s) Administered  . Influenza Whole 03/10/2012  . Influenza, High Dose Seasonal PF 04/02/2013  . Pneumococcal Conjugate-13 05/09/2013  . Tdap 04/09/2011  . Zoster 05/23/2013   Prior to Admission medications   Medication Sig Start Date End Date Taking? Authorizing Provider  atorvastatin (LIPITOR) 10 MG tablet Take 1 tablet (10 mg total) by mouth daily. 05/16/13  Yes [name] Loree Fee, FNP  diclofenac sodium (VOLTAREN) 1 % GEL Apply 2 g topically 4 (four) times daily. 05/09/13  Yes Erby Pian, FNP  furosemide (LASIX) 40 MG tablet  03/02/13  Yes Historical Provider, MD  meclizine (ANTIVERT) 25 MG tablet TAKE ONE TAB EVERY 6 HOURS AS NEEDED FOR VERTIGO 01/28/13  Yes [name] Loree Fee, FNP  meloxicam (MOBIC) 15 MG tablet TAKE ONE TABLET BY MOUTH ONE TIME DAILY 09/18/13  Yes [name] Loree Fee, FNP  nebivolol (BYSTOLIC) 10 MG tablet Take 1 tablet (10 mg total) by mouth daily. 09/06/13  Yes [name] Loree Fee, FNP  traMADol (ULTRAM) 50 MG tablet Take 1 tablet (50 mg total) by mouth daily as needed. 09/06/13  Yes [name] Loree Fee, FNP     ROS: As above in the HPI. All other systems are stable or negative.  OBJECTIVE: APPEARANCE:  Patient in no acute distress.The patient appeared well nourished and normally developed. Acyanotic. Waist: VITAL SIGNS:BP 140/64  Pulse 60  Temp(Src) 99 F (37.2 C) (Oral)  Ht 5' 6"  (1.676 m)  Wt 220 lb 9.6 oz (100.064 kg)  BMI 35.62 kg/m2  SpO2 97% Obese WF NAD  SKIN: warm and  Dry  without overt rashes, tattoos and scars  HEAD and Neck: without JVD, Head and scalp: normal Eyes:No scleral icterus. Fundi normal, eye movements normal. Ears: Auricle normal, canal normal, Tympanic membranes normal, insufflation normal. Nose: normal Throat: normal Neck & thyroid: normal  CHEST & LUNGS: Chest wall: normal Lungs: Clear. hacky cough  CVS: Reveals the PMI to be normally located. Regular rhythm, First and Second Heart sounds are normal,  absence of murmurs, rubs or gallops. Peripheral vasculature: Radial pulses: normal Dorsal pedis pulses: normal Posterior pulses: normal  ABDOMEN:  Appearance:Obese Benign, no organomegaly, no masses, no Abdominal Aortic enlargement. No Guarding , no rebound. No Bruits. Bowel sounds: normal  RECTAL: N/A GU: N/A  EXTREMETIES: 3 + edema on the right and 1+ edema on the leftnedematous. Homan's negative. Venous  Stasis dermatitis on the right.  MUSCULOSKELETAL:  Spine: normal Joints: intact  NEUROLOGIC: oriented to time,place and person; nonfocal. Strength is normal Sensory is normal Reflexes are normal Cranial Nerves are normal. Results for orders placed in visit on 10/02/13  POCT CBC      Result Value Ref Range   WBC 8.2  4.6 - 10.2 K/uL   Lymph, poc 2.2  0.6 - 3.4   POC LYMPH PERCENT 26.6  10 - 50 %L   POC Granulocyte 5.8  2 - 6.9   Granulocyte percent 71.0  37 - 80 %G   RBC 5.2  4.04 - 5.48 M/uL   Hemoglobin 14.5  12.2 - 16.2 g/dL   HCT, POC 45.6  37.7 - 47.9 %   MCV 88.1  80 - 97 fL   MCH, POC 28.0  27 - 31.2 pg   MCHC 31.8  31.8 - 35.4 g/dL   RDW, POC 14.0     Platelet Count, POC 222.0  142 - 424 K/uL   MPV 7.2  0 - 99.8 fL    ASSESSMENT: Dyspnea - Plan: EKG 12-Lead, DG Chest 2 View, POCT CBC, CMP14+EGFR, TSH, CT Angio Chest W/Cm &/Or Wo Cm  Other malaise and fatigue - Plan: TSH, Vitamin B12, Folate  Arthritis - Plan: Sedimentation rate, Rheumatoid factor  PLAN:  Orders Placed This Encounter   Procedures  . DG Chest 2 View    Standing Status: Future     Number of Occurrences: 1     Standing Expiration Date: 12/03/2014    Order Specific Question:  Reason for Exam (SYMPTOM  OR DIAGNOSIS REQUIRED)    Answer:  COUGH    Order Specific Question:  Preferred imaging location?    Answer:  [facility]  . CT Angio Chest W/Cm &/Or Wo Cm    Standing Status: Future     Number of Occurrences: 1     Standing Expiration Date: 01/02/2015    Order Specific Question:  Reason for Exam (SYMPTOM  OR DIAGNOSIS REQUIRED)    Answer:  dyspnea, cough, right leg venous  stasis and chronic  leg swelling. r/O PE.    Order Specific Question:  Preferred imaging location?    Answer:  [place]  . TSH  . Vitamin B12  . Folate  . Sedimentation rate  . Rheumatoid factor  . POCT CBC  . EKG 12-Lead  await CT result. Dr Laurance Flatten on call tonight informed of patient going for CT angio tonight. No orders of the defined types were placed in this encounter.   There are no discontinued medications. Return pending scan and labs.Dub Mikes P. Jacelyn Grip, M.D.

## 2013-10-03 LAB — CMP14+EGFR
ALT: 21 IU/L (ref 0–32)
AST: 18 IU/L (ref 0–40)
Albumin/Globulin Ratio: 1.4 (ref 1.1–2.5)
Albumin: 4.2 g/dL (ref 3.6–4.8)
Alkaline Phosphatase: 103 IU/L (ref 39–117)
BUN/Creatinine Ratio: 14 (ref 11–26)
BUN: 16 mg/dL (ref 8–27)
CO2: 23 mmol/L (ref 18–29)
Calcium: 9.3 mg/dL (ref 8.7–10.3)
Chloride: 103 mmol/L (ref 97–108)
Creatinine, Ser: 1.18 mg/dL — ABNORMAL HIGH (ref 0.57–1.00)
GFR calc Af Amer: 56 mL/min/{1.73_m2} — ABNORMAL LOW (ref >59)
GFR calc non Af Amer: 48 mL/min/{1.73_m2} — ABNORMAL LOW (ref >59)
Globulin, Total: 2.9 g/dL (ref 1.5–4.5)
Glucose: 87 mg/dL (ref 65–99)
Potassium: 4.3 mmol/L (ref 3.5–5.2)
Sodium: 142 mmol/L (ref 134–144)
Total Bilirubin: 0.5 mg/dL (ref 0.0–1.2)
Total Protein: 7.1 g/dL (ref 6.0–8.5)

## 2013-10-03 LAB — POCT I-STAT CREATININE: Creatinine, Ser: 1.4 mg/dL — ABNORMAL HIGH (ref 0.50–1.10)

## 2013-10-03 LAB — VITAMIN B12: Vitamin B-12: 302 pg/mL (ref 211–946)

## 2013-10-03 LAB — RHEUMATOID FACTOR: Rhuematoid fact SerPl-aCnc: <7 IU/mL (ref 0.0–13.9)

## 2013-10-03 LAB — FOLATE: Folate: 12.5 ng/mL (ref >3.0)

## 2013-10-03 LAB — TSH: TSH: 5.95 u[IU]/mL — ABNORMAL HIGH (ref 0.450–4.500)

## 2013-10-03 LAB — SEDIMENTATION RATE: Sed Rate: 5 mm/hr (ref 0–40)

## 2013-10-06 ENCOUNTER — Other Ambulatory Visit: Payer: Self-pay | Admitting: General Practice

## 2013-10-06 NOTE — Telephone Encounter (Signed)
Patient last seen in office on 10-02-13. Rx last filled on 09-06-13 for #30. Please advise. If approved please print and route to Pool A so nurse can call patient to pick up

## 2013-10-08 ENCOUNTER — Other Ambulatory Visit: Payer: Self-pay | Admitting: Family Medicine

## 2013-10-08 DIAGNOSIS — R899 Unspecified abnormal finding in specimens from other organs, systems and tissues: Secondary | ICD-10-CM

## 2013-10-08 NOTE — Progress Notes (Signed)
Quick Note:  Call Patient Labs that are abnormal: There is a reduction in her kidney function with the creatinine going up. The thyroid test TSH is abnormal The rest are at goal  Recommendations: We need to repeat the kidney function and do a full thyroid panel ASAP. Ordered in EPIC.   ______

## 2013-10-09 ENCOUNTER — Telehealth: Payer: Self-pay

## 2013-10-09 ENCOUNTER — Other Ambulatory Visit: Payer: Self-pay | Admitting: General Practice

## 2013-10-09 ENCOUNTER — Telehealth: Payer: Self-pay | Admitting: General Practice

## 2013-10-09 NOTE — Telephone Encounter (Signed)
Rx ready for pick up. 

## 2013-10-09 NOTE — Telephone Encounter (Signed)
Patient requesting tramadol refill for neck and back pain. She verbalized having appointment with ortho specialist in May 2015, but unsure of date. Informed patient I would check with referral and confirm appointment date/time. Will address tramadol refill afterwards. Patient verbalized understanding and in agreement.

## 2013-10-09 NOTE — Telephone Encounter (Signed)
Patient aware.

## 2013-10-12 ENCOUNTER — Other Ambulatory Visit: Payer: Self-pay | Admitting: Family Medicine

## 2013-10-12 NOTE — Telephone Encounter (Signed)
Last seen 10/02/13  FPW

## 2013-10-12 NOTE — Telephone Encounter (Signed)
Kimberly Walsh addressed her request for her rx

## 2013-10-13 NOTE — Telephone Encounter (Signed)
Call patient : Prescription refilled & sent to pharmacy in EP[facility]. 

## 2013-10-14 ENCOUNTER — Other Ambulatory Visit: Payer: Self-pay | Admitting: General Practice

## 2013-10-16 ENCOUNTER — Encounter: Payer: Self-pay | Admitting: General Practice

## 2013-10-16 ENCOUNTER — Ambulatory Visit (INDEPENDENT_AMBULATORY_CARE_PROVIDER_SITE_OTHER): Payer: BC Managed Care – PPO | Admitting: General Practice

## 2013-10-16 VITALS — BP 148/67 | HR 68 | Temp 97.1°F | Ht 66.0 in | Wt 225.0 lb

## 2013-10-16 DIAGNOSIS — R059 Cough, unspecified: Secondary | ICD-10-CM

## 2013-10-16 DIAGNOSIS — J069 Acute upper respiratory infection, unspecified: Secondary | ICD-10-CM

## 2013-10-16 DIAGNOSIS — I517 Cardiomegaly: Secondary | ICD-10-CM

## 2013-10-16 DIAGNOSIS — R899 Unspecified abnormal finding in specimens from other organs, systems and tissues: Secondary | ICD-10-CM

## 2013-10-16 DIAGNOSIS — R05 Cough: Secondary | ICD-10-CM

## 2013-10-16 DIAGNOSIS — R6889 Other general symptoms and signs: Secondary | ICD-10-CM

## 2013-10-16 MED ORDER — AZITHROMYCIN 250 MG PO TABS: ORAL_TABLET | ORAL | Status: DC

## 2013-10-16 MED ORDER — GUAIFENESIN-CODEINE 100-10 MG/5ML PO SYRP: 5.0000 mL | ORAL_SOLUTION | Freq: Three times a day (TID) | ORAL | Status: DC | PRN

## 2013-10-16 NOTE — Patient Instructions (Signed)

## 2013-10-16 NOTE — Telephone Encounter (Signed)
Being seen 10/16/13  [name]  If approved print and route to nurse

## 2013-10-16 NOTE — Telephone Encounter (Signed)
Patient aware of x-ray and ct results at office visit.

## 2013-10-17 ENCOUNTER — Other Ambulatory Visit: Payer: Self-pay | Admitting: Family Medicine

## 2013-10-17 DIAGNOSIS — E039 Hypothyroidism, unspecified: Secondary | ICD-10-CM

## 2013-10-17 LAB — THYROID PANEL WITH TSH
Free Thyroxine Index: 1.2 (ref 1.2–4.9)
T3 Uptake Ratio: 26 % (ref 24–39)
T4, Total: 4.6 ug/dL (ref 4.5–12.0)
TSH: 5.59 u[IU]/mL — ABNORMAL HIGH (ref 0.450–4.500)

## 2013-10-17 LAB — BMP8+EGFR
BUN/Creatinine Ratio: 12 (ref 11–26)
BUN: 12 mg/dL (ref 8–27)
CO2: 25 mmol/L (ref 18–29)
Calcium: 9.1 mg/dL (ref 8.7–10.3)
Chloride: 103 mmol/L (ref 97–108)
Creatinine, Ser: 1.01 mg/dL — ABNORMAL HIGH (ref 0.57–1.00)
GFR calc Af Amer: 67 mL/min/{1.73_m2} (ref >59)
GFR calc non Af Amer: 58 mL/min/{1.73_m2} — ABNORMAL LOW (ref >59)
Glucose: 84 mg/dL (ref 65–99)
Potassium: 4.4 mmol/L (ref 3.5–5.2)
Sodium: 140 mmol/L (ref 134–144)

## 2013-10-17 MED ORDER — LEVOTHYROXINE SODIUM 25 MCG PO TABS: 25.0000 ug | ORAL_TABLET | Freq: Every day | ORAL | Status: DC

## 2013-10-17 NOTE — Progress Notes (Signed)
Quick Note:  Call Patient Labs that are abnormal: The thyroid test shows subclinical hypothyroidism  The rest are at goal: the kidney function test was back to previous results and Stable.  Recommendations: Since you have Symptoms of fatigue you will need to go on a low dose of thyroid medications and recheck the level in 6 weeks.   ______

## 2013-10-18 ENCOUNTER — Telehealth: Payer: Self-pay | Admitting: Family Medicine

## 2013-10-18 NOTE — Telephone Encounter (Signed)
Message copied by Waverly Ferrari on Wed Oct 18, 2013 11:11 AM ------      Message from: Vernie Shanks      Created: Tue Oct 17, 2013  8:51 AM       Call Patient      Labs that are abnormal:      The thyroid test shows subclinical hypothyroidism            The rest are at goal: the kidney function test was back to previous results and  Stable.            Recommendations:      Since you have  Symptoms of fatigue you will need to go on a low dose of thyroid medications and recheck the level in 6 weeks.             ------

## 2013-10-19 ENCOUNTER — Encounter: Payer: Self-pay | Admitting: General Practice

## 2013-10-19 NOTE — Progress Notes (Signed)
   Subjective:    Patient ID: Kimberly Walsh, female    DOB: 10/11/1945, 68 y.o.   MRN: [id number]  Cough This is a new problem. The current episode started in the past 7 days. The problem has been gradually worsening. The problem occurs every few minutes. The cough is productive of sputum. Associated symptoms include nasal congestion and postnasal drip. Pertinent negatives include no chest pain, chills, fever, shortness of breath or wheezing. The symptoms are aggravated by lying down. She has tried OTC cough suppressant for the symptoms. Her past medical history is significant for bronchitis. There is no history of asthma or pneumonia.      Review of Systems  Constitutional: Negative for fever and chills.  HENT: Positive for postnasal drip.   Respiratory: Positive for cough. Negative for chest tightness, shortness of breath and wheezing.   Cardiovascular: Negative for chest pain and palpitations.       Objective:   Physical Exam  Constitutional: She is oriented to person, place, and time. She appears well-developed and well-nourished.  Cardiovascular: Normal rate, regular rhythm and normal heart sounds.   Pulmonary/Chest: Effort normal and breath sounds normal. No respiratory distress. She exhibits no tenderness.  Congested cough  Neurological: She is alert and oriented to person, place, and time.  Skin: Skin is warm and dry.  Psychiatric: She has a normal mood and affect.          Assessment & Plan:  1. Cough  - guaiFENesin-codeine (CHERATUSSIN AC) 100-10 MG/5ML syrup; Take 5 mLs by mouth 3 (three) times daily as needed for cough.  Dispense: 120 mL; Refill: 0  2. Upper respiratory infection  - azithromycin (ZITHROMAX) 250 MG tablet; Take as directed  Dispense: 6 tablet; Refill: 0  3. Cardiomegaly  - Ambulatory referral to Cardiology (mild cardiomegaly noted on previous CT scan)  -RTO prn and in 2 weeks for follow up -may seek emergency medical treatment -Patient  verbalized understanding Erby Pian, FNP-C

## 2013-10-20 ENCOUNTER — Other Ambulatory Visit: Payer: Self-pay | Admitting: General Practice

## 2013-10-27 NOTE — Telephone Encounter (Signed)
Pt aware of lab.  She will pick up low dose thyroid med at pharmacy.  She scheduled 6wk rck thryoid lab. rs

## 2013-11-02 ENCOUNTER — Other Ambulatory Visit: Payer: Self-pay | Admitting: Orthopedic Surgery

## 2013-11-02 ENCOUNTER — Ambulatory Visit (INDEPENDENT_AMBULATORY_CARE_PROVIDER_SITE_OTHER): Payer: BC Managed Care – PPO

## 2013-11-02 DIAGNOSIS — R52 Pain, unspecified: Secondary | ICD-10-CM

## 2013-11-02 DIAGNOSIS — M25559 Pain in unspecified hip: Secondary | ICD-10-CM

## 2013-11-06 ENCOUNTER — Ambulatory Visit: Payer: BC Managed Care – PPO | Admitting: Physical Therapy

## 2013-11-08 ENCOUNTER — Encounter: Payer: BC Managed Care – PPO | Admitting: Physical Therapy

## 2013-11-10 ENCOUNTER — Other Ambulatory Visit: Payer: Self-pay

## 2013-11-10 MED ORDER — TRAMADOL HCL 50 MG PO TABS: ORAL_TABLET | ORAL | Status: DC

## 2013-11-10 NOTE — Telephone Encounter (Signed)
PaTient aware to rx

## 2013-11-10 NOTE — Telephone Encounter (Signed)
Last seen [facility] 4/15  If approved print and route to nurse

## 2013-11-10 NOTE — Telephone Encounter (Signed)
rx ready for pickup 

## 2013-11-16 ENCOUNTER — Ambulatory Visit: Payer: BC Managed Care – PPO | Admitting: Physical Therapy

## 2013-11-21 ENCOUNTER — Telehealth: Payer: Self-pay | Admitting: Nurse Practitioner

## 2013-11-21 ENCOUNTER — Ambulatory Visit (INDEPENDENT_AMBULATORY_CARE_PROVIDER_SITE_OTHER): Payer: BC Managed Care – PPO

## 2013-11-21 ENCOUNTER — Encounter: Payer: Self-pay | Admitting: Family Medicine

## 2013-11-21 ENCOUNTER — Ambulatory Visit (INDEPENDENT_AMBULATORY_CARE_PROVIDER_SITE_OTHER): Payer: BC Managed Care – PPO | Admitting: Family Medicine

## 2013-11-21 VITALS — BP 144/72 | HR 66 | Temp 97.2°F | Ht 66.0 in | Wt 214.0 lb

## 2013-11-21 DIAGNOSIS — J069 Acute upper respiratory infection, unspecified: Secondary | ICD-10-CM

## 2013-11-21 DIAGNOSIS — R0602 Shortness of breath: Secondary | ICD-10-CM

## 2013-11-21 DIAGNOSIS — R05 Cough: Secondary | ICD-10-CM

## 2013-11-21 DIAGNOSIS — R059 Cough, unspecified: Secondary | ICD-10-CM

## 2013-11-21 MED ORDER — LEVALBUTEROL HCL 1.25 MG/3ML IN NEBU
1.2500 mg | INHALATION_SOLUTION | Freq: Once | RESPIRATORY_TRACT | Status: AC
  Administered 2013-11-21: 1.25 mg via RESPIRATORY_TRACT

## 2013-11-21 MED ORDER — BENZONATATE 100 MG PO CAPS: 100.0000 mg | ORAL_CAPSULE | Freq: Three times a day (TID) | ORAL | Status: DC | PRN

## 2013-11-21 MED ORDER — METHYLPREDNISOLONE ACETATE 80 MG/ML IJ SUSP
80.0000 mg | Freq: Once | INTRAMUSCULAR | Status: AC
  Administered 2013-11-21: 80 mg via INTRAMUSCULAR

## 2013-11-21 MED ORDER — AZITHROMYCIN 250 MG PO TABS: ORAL_TABLET | ORAL | Status: DC

## 2013-11-21 MED ORDER — LEVALBUTEROL HCL 1.25 MG/0.5ML IN NEBU: 1.2500 mg | INHALATION_SOLUTION | Freq: Once | RESPIRATORY_TRACT | Status: DC

## 2013-11-21 NOTE — Telephone Encounter (Signed)
appt given for 2 with bill

## 2013-11-21 NOTE — Progress Notes (Signed)
   Subjective:    Patient ID: Kimberly Walsh, female    DOB: February 28, 1946, 68 y.o.   MRN: [id number]  HPI This 68 y.o. female presents for evaluation of uri sx's and cough.  She has been having a lot of wheezing and coughing at night time.   Review of Systems C/o uri sx's and cough No chest pain, SOB, HA, dizziness, vision change, N/V, diarrhea, constipation, dysuria, urinary urgency or frequency, myalgias, arthralgias or rash.     Objective:   Physical Exam Vital signs noted  Well developed well nourished female.  HEENT - Head atraumatic Normocephalic                Eyes - PERRLA, Conjuctiva - clear Sclera- Clear EOMI                Ears - EAC's Wnl TM's Wnl Gross Hearing WNL                Nose - Nares patent                 Throat - oropharanx wnl Respiratory - Lungs CTA bilateral Cardiac - RRR S1 and S2 without murmur GI - Abdomen soft Nontender and bowel sounds active x 4 Extremities - No edema. Neuro - Grossly intact.  CXR - No infiltrates Prelimnary reading by Iverson Alamin     Assessment & Plan:  Cough - Plan: DG Chest 2 View, methylPREDNISolone acetate (DEPO-MEDROL) injection 80 mg, levalbuterol (XOPENEX) nebulizer solution 1.25 mg, azithromycin (ZITHROMAX) 250 MG tablet, benzonatate (TESSALON PERLES) 100 MG capsule  SOB (shortness of breath) - Plan: DG Chest 2 View, methylPREDNISolone acetate (DEPO-MEDROL) injection 80 mg, levalbuterol (XOPENEX) nebulizer solution 1.25 mg, azithromycin (ZITHROMAX) 250 MG tablet, benzonatate (TESSALON PERLES) 100 MG capsule, levalbuterol (XOPENEX) nebulizer solution 1.25 mg  Upper respiratory infection - Plan: methylPREDNISolone acetate (DEPO-MEDROL) injection 80 mg, levalbuterol (XOPENEX) nebulizer solution 1.25 mg, azithromycin (ZITHROMAX) 250 MG tablet, benzonatate (TESSALON PERLES) 100 MG capsule, levalbuterol (XOPENEX) nebulizer solution 1.25 mg  Push po fluids, rest, tylenol and motrin otc prn as directed for fever,  arthralgias, and myalgias.  Follow up prn if sx's continue or persist.  Lysbeth Penner FNP

## 2013-12-05 ENCOUNTER — Other Ambulatory Visit: Payer: BC Managed Care – PPO

## 2013-12-05 ENCOUNTER — Encounter: Payer: Self-pay | Admitting: *Deleted

## 2013-12-12 ENCOUNTER — Other Ambulatory Visit: Payer: Self-pay | Admitting: Nurse Practitioner

## 2013-12-13 ENCOUNTER — Institutional Professional Consult (permissible substitution): Payer: BC Managed Care – PPO | Admitting: Cardiology

## 2013-12-13 ENCOUNTER — Other Ambulatory Visit: Payer: Self-pay | Admitting: Nurse Practitioner

## 2013-12-13 MED ORDER — TRAMADOL HCL 50 MG PO TABS: ORAL_TABLET | ORAL | Status: DC

## 2013-12-13 NOTE — Telephone Encounter (Signed)
Patient of Dr Jacelyn Grip and [name]. Last seen in office on 11-21-13. Rx last filled on 11-10-13 for #30. Please advise. If approved please print and route to pool B so nurse can call patient to pick up

## 2013-12-31 ENCOUNTER — Other Ambulatory Visit: Payer: Self-pay | Admitting: General Practice

## 2014-01-08 ENCOUNTER — Telehealth: Payer: Self-pay | Admitting: Nurse Practitioner

## 2014-01-08 NOTE — Telephone Encounter (Signed)
appt scheduled and I have called and requested records from [facility]

## 2014-01-09 ENCOUNTER — Ambulatory Visit (INDEPENDENT_AMBULATORY_CARE_PROVIDER_SITE_OTHER): Payer: BC Managed Care – PPO | Admitting: Nurse Practitioner

## 2014-01-09 ENCOUNTER — Encounter: Payer: Self-pay | Admitting: Nurse Practitioner

## 2014-01-09 VITALS — BP 114/62 | HR 52 | Temp 98.3°F | Ht 66.0 in | Wt 221.2 lb

## 2014-01-09 DIAGNOSIS — D72829 Elevated white blood cell count, unspecified: Secondary | ICD-10-CM

## 2014-01-09 DIAGNOSIS — E039 Hypothyroidism, unspecified: Secondary | ICD-10-CM

## 2014-01-09 DIAGNOSIS — L02419 Cutaneous abscess of limb, unspecified: Secondary | ICD-10-CM

## 2014-01-09 DIAGNOSIS — L03115 Cellulitis of right lower limb: Secondary | ICD-10-CM

## 2014-01-09 DIAGNOSIS — Z09 Encounter for follow-up examination after completed treatment for conditions other than malignant neoplasm: Secondary | ICD-10-CM

## 2014-01-09 DIAGNOSIS — L03119 Cellulitis of unspecified part of limb: Secondary | ICD-10-CM

## 2014-01-09 DIAGNOSIS — I878 Other specified disorders of veins: Secondary | ICD-10-CM

## 2014-01-09 DIAGNOSIS — I872 Venous insufficiency (chronic) (peripheral): Secondary | ICD-10-CM

## 2014-01-09 LAB — POCT CBC
Granulocyte percent: 82.9 %G — AB (ref 37–80)
HCT, POC: 38.6 % (ref 37.7–47.9)
Hemoglobin: 13 g/dL (ref 12.2–16.2)
LYMPH, POC: 1.2 (ref 0.6–3.4)
MCH: 29.8 pg (ref 27–31.2)
MCHC: 33.8 g/dL (ref 31.8–35.4)
MCV: 88.3 fL (ref 80–97)
MPV: 6.5 fL (ref 0–99.8)
POC Granulocyte: 7.5 — AB (ref 2–6.9)
POC LYMPH %: 13.4 % (ref 10–50)
Platelet Count, POC: 165 10*3/uL (ref 142–424)
RBC: 4.4 M/uL (ref 4.04–5.48)
RDW, POC: 14.4 %
WBC: 9.1 10*3/uL (ref 4.6–10.2)

## 2014-01-09 MED ORDER — V-2 HIGH COMPRESSION HOSE MISC: 1.0000 | Freq: Every day | Status: DC

## 2014-01-09 MED ORDER — MELOXICAM 15 MG PO TABS: ORAL_TABLET | ORAL | Status: DC

## 2014-01-09 MED ORDER — TRAMADOL HCL 50 MG PO TABS: ORAL_TABLET | ORAL | Status: DC

## 2014-01-09 NOTE — Patient Instructions (Signed)
Cellulitis Cellulitis is an infection of the skin and the tissue beneath it. The infected area is usually red and tender. Cellulitis occurs most often in the arms and lower legs.  CAUSES  Cellulitis is caused by bacteria that enter the skin through cracks or cuts in the skin. The most common types of bacteria that cause cellulitis are Staphylococcus and Streptococcus. SYMPTOMS   Redness and warmth.  Swelling.  Tenderness or pain.  Fever. DIAGNOSIS  Your caregiver can usually determine what is wrong based on a physical exam. Blood tests may also be done. TREATMENT  Treatment usually involves taking an antibiotic medicine. HOME CARE INSTRUCTIONS   Take your antibiotics as directed. Finish them even if you start to feel better.  Keep the infected arm or leg elevated to reduce swelling.  Apply a warm cloth to the affected area up to 4 times per day to relieve pain.  Only take over-the-counter or prescription medicines for pain, discomfort, or fever as directed by your caregiver.  Keep all follow-up appointments as directed by your caregiver. SEEK MEDICAL CARE IF:   You notice red streaks coming from the infected area.  Your red area gets larger or turns dark in color.  Your bone or joint underneath the infected area becomes painful after the skin has healed.  Your infection returns in the same area or another area.  You notice a swollen bump in the infected area.  You develop new symptoms. SEEK IMMEDIATE MEDICAL CARE IF:   You have a fever.  You feel very sleepy.  You develop vomiting or diarrhea.  You have a general ill feeling (malaise) with muscle aches and pains. MAKE SURE YOU:   Understand these instructions.  Will watch your condition.  Will get help right away if you are not doing well or get worse. Document Released: 03/18/2005 Document Revised: 12/08/2011 Document Reviewed: 08/24/2011 ExitCare Patient Information 2015 [employer]. This information is  not intended to replace advice given to you by your health care provider. Make sure you discuss any questions you have with your health care provider.  

## 2014-01-09 NOTE — Progress Notes (Signed)
Subjective:    Patient ID: Kimberly Walsh, female    DOB: [birth year], 68 y.o.   MRN: [id number]  HPI Patient is here today for a hospital follow up. She went to [facility] due to Nausea and headache. She also has right lower leg cellulitis. She was given doxycycline for the leg. zofran for nauseas and norco for the headache.  She denies any fever since being d/c from the hospital. Right leg still swollen and red- she has not been taking her lasix.  * Dx with hypoythyroidism in April and was started on levothyroxin and is still c/o fatigue- has not had labs repeated since starting on meds.  Review of Systems  Constitutional: Negative.   HENT:       Headache.   Eyes: Negative.   Respiratory: Negative.   Cardiovascular: Negative.   Gastrointestinal: Negative.   Endocrine: Negative.   Genitourinary: Negative.   Musculoskeletal: Negative.   Allergic/Immunologic: Negative.   Neurological: Positive for headaches.       Objective:   Physical Exam  Constitutional: She appears well-developed.  HENT:  Head: Normocephalic.  Eyes: Pupils are equal, round, and reactive to light.  Neck: Normal range of motion.  Cardiovascular: Normal rate.   Abdominal: Soft.  Musculoskeletal: She exhibits edema (2+ right lower leg).  Neurological: She is alert.  Skin: There is erythema (right lower leg cellulitis. ).  Psychiatric: She has a normal mood and affect. Her behavior is normal. Judgment and thought content normal.    BP 114/62  Pulse 52  Temp(Src) 98.3 F (36.8 C) (Oral)  Ht 5\' 6"  (1.676 m)  Wt 221 lb 3.2 oz (100.336 kg)  BMI 35.72 kg/m2  Results for orders placed in visit on 01/09/14  POCT CBC      Result Value Ref Range   WBC 9.1  4.6 - 10.2 K/uL   Lymph, poc 1.2  0.6 - 3.4   POC LYMPH PERCENT 13.4  10 - 50 %L   POC Granulocyte 7.5 (*) 2 - 6.9   Granulocyte percent 82.9 (*) 37 - 80 %G   RBC 4.4  4.04 - 5.48 M/uL   Hemoglobin 13.0  12.2 - 16.2 g/dL   HCT, POC 38.6  37.7 - 47.9  %   MCV 88.3  80 - 97 fL   MCH, POC 29.8  27 - 31.2 pg   MCHC 33.8  31.8 - 35.4 g/dL   RDW, POC 14.4     Platelet Count, POC 165.0  142 - 424 K/uL   MPV 6.5  0 - 99.8 fL         Assessment & Plan:   1. Hospital discharge follow-up   2. Elevated WBC count   3. Unspecified hypothyroidism   4. Cellulitis of right lower extremity   5. Venous stasis    Meds ordered this encounter  Medications  . doxycycline (VIBRA-TABS) 100 MG tablet    Sig:   . guaiFENesin-codeine 100-10 MG/5ML syrup    Sig:   . ondansetron (ZOFRAN-ODT) 8 MG disintegrating tablet    Sig:   . HYDROcodone-acetaminophen (NORCO/VICODIN) 5-325 MG per tablet    Sig:   . meloxicam (MOBIC) 15 MG tablet    Sig: TAKE ONE TABLET BY MOUTH ONE TIME DAILY    Dispense:  30 tablet    Refill:  2    Order Specific Question:  Supervising Provider    Answer:  Chipper Herb [[id number]]  . traMADol (ULTRAM) 50 MG tablet  Sig: TAKE ONE TABLET BY MOUTH DAILY AS NEEDED    Dispense:  30 tablet    Refill:  0    Order Specific Question:  Supervising Provider    Answer:  Chipper Herb [[id number]]  . Elastic Bandages & Supports (V-2 HIGH COMPRESSION HOSE) MISC    Sig: 1 each by Does not apply route daily.    Dispense:  1 each    Refill:  0    20-30lbs    Order Specific Question:  Supervising Provider    Answer:  Chipper Herb [[id number]]   TSH pending Patient encourage to keep leg elevated Take her fluid pill Wear compression hose Continue to take antibiotic as prescribed RTO PRN Mary-Margaret Hassell Done, FNP

## 2014-01-10 ENCOUNTER — Telehealth: Payer: Self-pay | Admitting: Nurse Practitioner

## 2014-01-10 ENCOUNTER — Encounter: Payer: Self-pay | Admitting: Nurse Practitioner

## 2014-01-10 ENCOUNTER — Other Ambulatory Visit: Payer: Self-pay | Admitting: Nurse Practitioner

## 2014-01-10 DIAGNOSIS — E039 Hypothyroidism, unspecified: Secondary | ICD-10-CM

## 2014-01-10 LAB — THYROID PANEL WITH TSH
Free Thyroxine Index: 1.5 (ref 1.2–4.9)
T3 UPTAKE RATIO: 30 % (ref 24–39)
T4, Total: 5.1 ug/dL (ref 4.5–12.0)
TSH: 4.88 u[IU]/mL — AB (ref 0.450–4.500)

## 2014-01-10 MED ORDER — LEVOTHYROXINE SODIUM 25 MCG PO TABS: ORAL_TABLET | ORAL | Status: DC

## 2014-01-11 NOTE — Telephone Encounter (Signed)
Letter ready for pick up

## 2014-01-11 NOTE — Telephone Encounter (Signed)
Patient aware.

## 2014-01-11 NOTE — Telephone Encounter (Signed)
This was forwarded for Kimberly Walsh to authorize. Patient aware that we are waiting on that.

## 2014-01-22 ENCOUNTER — Encounter: Payer: Self-pay | Admitting: Nurse Practitioner

## 2014-01-22 ENCOUNTER — Ambulatory Visit (INDEPENDENT_AMBULATORY_CARE_PROVIDER_SITE_OTHER): Payer: BC Managed Care – PPO | Admitting: Nurse Practitioner

## 2014-01-22 VITALS — BP 131/70 | HR 83 | Temp 97.6°F | Wt 213.0 lb

## 2014-01-22 DIAGNOSIS — R5381 Other malaise: Secondary | ICD-10-CM

## 2014-01-22 DIAGNOSIS — I252 Old myocardial infarction: Secondary | ICD-10-CM

## 2014-01-22 DIAGNOSIS — R5383 Other fatigue: Principal | ICD-10-CM

## 2014-01-22 NOTE — Progress Notes (Signed)
   Subjective:    Patient ID: Kimberly Walsh, female    DOB: 06-18-1946, 68 y.o.   MRN: [id number]  HPI Patient was seen 2 weeks ago for hospital follow up. Was dx with cellulitis- She felt so bad after seen she went  To [facility] and had to stay over night. They told her that she had a heart attack in the past- She has been written out of work for 2 weeks- Has an appointment with cardiologist mid August. 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 Roney Jaffe taht she is just very fatigued and short of breath. She says had stress test at [facility] and that she did not pass it.    Review of Systems  Constitutional: Negative.   HENT: Negative.   Respiratory: Negative.   Cardiovascular: Negative.   Genitourinary: Negative.   Neurological: Negative.   Psychiatric/Behavioral: Negative.   All other systems reviewed and are negative.      Objective:   Physical Exam  Constitutional: She is oriented to person, place, and time. She appears well-developed and well-nourished.  Cardiovascular: Normal rate and normal heart sounds.   Pulmonary/Chest: Effort normal and breath sounds normal.  Neurological: She is alert and oriented to person, place, and time.  Skin: Skin is warm and dry.  Psychiatric: She has a normal mood and affect. Her behavior is normal. Judgment and thought content normal.    BP 131/70  Pulse 83  Temp(Src) 97.6 F (36.4 C) (Oral)  Wt 213 lb (96.616 kg)       Assessment & Plan:   1. Other malaise and fatigue   2. Past heart attack    Rest Out of work till sees cardiologist No strenuous activity until sees cardiologist RTO prn FMLA papers filled out [place], [place]

## 2014-01-22 NOTE — Patient Instructions (Signed)
Stress and Stress Management Stress is a normal reaction to life events. It is what you feel when life demands more than you are used to or more than you can handle. Some stress can be useful. For example, the stress reaction can help you catch the last bus of the day, study for a test, or meet a deadline at work. But stress that occurs too often or for too long can cause problems. It can affect your emotional health and interfere with relationships and normal daily activities. Too much stress can weaken your immune system and increase your risk for physical illness. If you already have a medical problem, stress can make it worse. CAUSES  All sorts of life events may cause stress. An event that causes stress for one person may not be stressful for another person. Major life events commonly cause stress. These may be positive or negative. Examples include losing your job, moving into a new home, getting married, having a baby, or losing a loved one. Less obvious life events may also cause stress, especially if they occur day after day or in combination. Examples include working long hours, driving in traffic, caring for children, being in debt, or being in a difficult relationship. SIGNS AND SYMPTOMS Stress may cause emotional symptoms including, the following:  Anxiety. This is feeling worried, afraid, on edge, overwhelmed, or out of control.  Anger. This is feeling irritated or impatient.  Depression. This is feeling sad, down, helpless, or guilty.  Difficulty focusing, remembering, or making decisions. Stress may cause physical symptoms, including the following:   Aches and pains. These may affect your head, neck, back, stomach, or other areas of your body.  Tight muscles or clenched jaw.  Low energy or trouble sleeping. Stress may cause unhealthy behaviors, including the following:   Eating to feel better (overeating) or skipping meals.  Sleeping too little, too much, or both.  Working  too much or putting off tasks (procrastination).  Smoking, drinking alcohol, or using drugs to feel better. DIAGNOSIS  Stress is diagnosed through an assessment by your health care provider. Your health care provider will ask questions about your symptoms and any stressful life events.Your health care provider will also ask about your medical history and may order blood tests or other tests. Certain medical conditions and medicine can cause physical symptoms similar to stress. Mental illness can cause emotional symptoms and unhealthy behaviors similar to stress. Your health care provider may refer you to a mental health professional for further evaluation.  TREATMENT  Stress management is the recommended treatment for stress.The goals of stress management are reducing stressful life events and coping with stress in healthy ways.  Techniques for reducing stressful life events include the following:  Stress identification. Self-monitor for stress and identify what causes stress for you. These skills may help you to avoid some stressful events.  Time management. Set your priorities, keep a calendar of events, and learn to say "no." These tools can help you avoid making too many commitments. Techniques for coping with stress include the following:  Rethinking the problem. Try to think realistically about stressful events rather than ignoring them or overreacting. Try to find the positives in a stressful situation rather than focusing on the negatives.  Exercise. Physical exercise can release both physical and emotional tension. The key is to find a form of exercise you enjoy and do it regularly.  Relaxation techniques. These relax the body and mind. Examples include yoga, meditation, tai chi, biofeedback, deep  breathing, progressive muscle relaxation, listening to music, being out in nature, journaling, and other hobbies. Again, the key is to find one or more that you enjoy and can do  regularly.  Healthy lifestyle. Eat a balanced diet, get plenty of sleep, and do not smoke. Avoid using alcohol or drugs to relax.  Strong support network. Spend time with family, friends, or other people you enjoy being around.Express your feelings and talk things over with someone you trust. Counseling or talktherapy with a mental health professional may be helpful if you are having difficulty managing stress on your own. Medicine is typically not recommended for the treatment of stress.Talk to your health care provider if you think you need medicine for symptoms of stress. HOME CARE INSTRUCTIONS  Keep all follow-up visits as directed by your health care provider.  Take all medicines as directed by your health care provider. SEEK MEDICAL CARE IF:  Your symptoms get worse or you start having new symptoms.  You feel overwhelmed by your problems and can no longer manage them on your own. SEEK IMMEDIATE MEDICAL CARE IF:  You feel like hurting yourself or someone else. Document Released: 12/02/2000 Document Revised: 10/23/2013 Document Reviewed: 01/31/2013 Exit[employer] Patient Information 2015 [employer]. This information is not intended to replace advice given to you by your health care provider. Make sure you discuss any questions you have with your health care provider.  

## 2014-02-04 ENCOUNTER — Other Ambulatory Visit: Payer: Self-pay | Admitting: General Practice

## 2014-02-13 DIAGNOSIS — R079 Chest pain, unspecified: Secondary | ICD-10-CM | POA: Insufficient documentation

## 2014-02-13 DIAGNOSIS — I10 Essential (primary) hypertension: Secondary | ICD-10-CM | POA: Insufficient documentation

## 2014-02-16 ENCOUNTER — Other Ambulatory Visit: Payer: Self-pay

## 2014-02-16 NOTE — Telephone Encounter (Signed)
Last seen 01/22/14  MMM If approved print and route to nurse

## 2014-02-19 MED ORDER — TRAMADOL HCL 50 MG PO TABS: ORAL_TABLET | ORAL | Status: DC

## 2014-02-19 NOTE — Telephone Encounter (Signed)
Patient aware.

## 2014-02-19 NOTE — Telephone Encounter (Signed)
rx ready for pickup 

## 2014-02-22 ENCOUNTER — Encounter: Payer: Self-pay | Admitting: Nurse Practitioner

## 2014-02-22 ENCOUNTER — Ambulatory Visit (INDEPENDENT_AMBULATORY_CARE_PROVIDER_SITE_OTHER): Payer: BC Managed Care – PPO | Admitting: Nurse Practitioner

## 2014-02-22 VITALS — BP 116/54 | HR 53 | Temp 96.7°F | Ht 66.0 in | Wt 221.6 lb

## 2014-02-22 DIAGNOSIS — R5381 Other malaise: Secondary | ICD-10-CM

## 2014-02-22 DIAGNOSIS — I10 Essential (primary) hypertension: Secondary | ICD-10-CM

## 2014-02-22 DIAGNOSIS — R5383 Other fatigue: Principal | ICD-10-CM

## 2014-02-22 NOTE — Progress Notes (Signed)
   Subjective:    Patient ID: Kimberly Walsh, female    DOB: [birth year], 68 y.o.   MRN: [id number]  HPI Patient is here for a follow up per her cardiologist for a recheck of blood pressure.  She was 168-73 in his office and he wanted to have her rechecked.  No new blood pressure medicines were started by cardiologist.  States she feels very tired each day and not sleeping well for several months.   Review of Systems  Constitutional: Positive for fatigue.  Respiratory: Negative for apnea, chest tightness and shortness of breath.   Cardiovascular: Positive for leg swelling (r lower leg). Negative for chest pain and palpitations.  Skin: Negative.   Neurological: Negative.   All other systems reviewed and are negative.      Objective:   Physical Exam  Constitutional: She is oriented to person, place, and time. She appears well-developed and well-nourished.  Cardiovascular: Normal rate, regular rhythm and normal heart sounds.   Pulmonary/Chest: Effort normal and breath sounds normal.  Abdominal: Soft. Bowel sounds are normal.  Musculoskeletal:       Right lower leg: She exhibits edema.  Neurological: She is alert and oriented to person, place, and time. She has normal reflexes.  Skin: Skin is warm and dry.  Psychiatric: She has a normal mood and affect. Her behavior is normal. Judgment and thought content normal.   BP 116/54  Pulse 53  Temp(Src) 96.7 F (35.9 C) (Oral)  Ht _0  (1.676 m)  Wt 221 lb 9.6 oz (100.517 kg)  BMI 35.78 kg/m2        Assessment & Plan:  1. Other malaise and fatigue Rest Force fluids - Anemia Profile B - Thyroid Panel With TSH  2. Essential hypertension, benign Keep diary of blood pressure at home - CMP14+EGFR - NMR, lipoprofile   Mary-Margaret Hassell Done, FNP

## 2014-02-22 NOTE — Patient Instructions (Signed)

## 2014-02-23 ENCOUNTER — Telehealth: Payer: Self-pay | Admitting: *Deleted

## 2014-02-23 LAB — ANEMIA PROFILE B
BASOS ABS: 0 10*3/uL (ref 0.0–0.2)
Basos: 0 %
Eos: 6 %
Eosinophils Absolute: 0.4 10*3/uL (ref 0.0–0.4)
FOLATE: 10.4 ng/mL (ref >3.0)
Ferritin: 170 ng/mL — ABNORMAL HIGH (ref 15–150)
HCT: 41 % (ref 34.0–46.6)
Hemoglobin: 13.8 g/dL (ref 11.1–15.9)
IRON: 52 ug/dL (ref 35–155)
Immature Grans (Abs): 0 10*3/uL (ref 0.0–0.1)
Immature Granulocytes: 0 %
Iron Saturation: 20 % (ref 15–55)
LYMPHS: 27 %
Lymphocytes Absolute: 2 10*3/uL (ref 0.7–3.1)
MCH: 29.9 pg (ref 26.6–33.0)
MCHC: 33.7 g/dL (ref 31.5–35.7)
MCV: 89 fL (ref 79–97)
MONOS ABS: 0.5 10*3/uL (ref 0.1–0.9)
Monocytes: 6 %
NEUTROS ABS: 4.5 10*3/uL (ref 1.4–7.0)
Neutrophils Relative %: 61 %
Platelets: 225 10*3/uL (ref 150–379)
RBC: 4.62 x10E6/uL (ref 3.77–5.28)
RDW: 14.6 % (ref 12.3–15.4)
RETIC CT PCT: 2.6 % (ref 0.6–2.6)
TIBC: 256 ug/dL (ref 250–450)
UIBC: 204 ug/dL (ref 150–375)
VITAMIN B 12: 281 pg/mL (ref 211–946)
WBC: 7.5 10*3/uL (ref 3.4–10.8)

## 2014-02-23 LAB — NMR, LIPOPROFILE
Cholesterol: 153 mg/dL (ref 100–199)
HDL CHOLESTEROL BY NMR: 36 mg/dL — AB (ref >39)
HDL PARTICLE NUMBER: 28.7 umol/L — AB (ref >=30.5)
LDL Particle Number: 1036 nmol/L — ABNORMAL HIGH (ref <1000)
LDL SIZE: 20.9 nm (ref >20.5)
LDLC SERPL CALC-MCNC: 84 mg/dL (ref 0–99)
LP-IR SCORE: 63 — AB (ref <=45)
SMALL LDL PARTICLE NUMBER: 399 nmol/L (ref <=527)
Triglycerides by NMR: 163 mg/dL — ABNORMAL HIGH (ref 0–149)

## 2014-02-23 LAB — THYROID PANEL WITH TSH
Free Thyroxine Index: 1.7 (ref 1.2–4.9)
T3 Uptake Ratio: 26 % (ref 24–39)
T4 TOTAL: 6.6 ug/dL (ref 4.5–12.0)
TSH: 3.06 u[IU]/mL (ref 0.450–4.500)

## 2014-02-23 LAB — CMP14+EGFR
A/G RATIO: 1.6 (ref 1.1–2.5)
ALK PHOS: 100 IU/L (ref 39–117)
ALT: 17 IU/L (ref 0–32)
AST: 17 IU/L (ref 0–40)
Albumin: 4.2 g/dL (ref 3.6–4.8)
BUN / CREAT RATIO: 16 (ref 11–26)
BUN: 18 mg/dL (ref 8–27)
CALCIUM: 9.3 mg/dL (ref 8.7–10.3)
CO2: 23 mmol/L (ref 18–29)
Chloride: 102 mmol/L (ref 97–108)
Creatinine, Ser: 1.12 mg/dL — ABNORMAL HIGH (ref 0.57–1.00)
GFR calc Af Amer: 59 mL/min/{1.73_m2} — ABNORMAL LOW (ref >59)
GFR, EST NON AFRICAN AMERICAN: 51 mL/min/{1.73_m2} — AB (ref >59)
Globulin, Total: 2.7 g/dL (ref 1.5–4.5)
Glucose: 76 mg/dL (ref 65–99)
POTASSIUM: 4 mmol/L (ref 3.5–5.2)
SODIUM: 141 mmol/L (ref 134–144)
Total Bilirubin: 0.5 mg/dL (ref 0.0–1.2)
Total Protein: 6.9 g/dL (ref 6.0–8.5)

## 2014-02-23 NOTE — Telephone Encounter (Signed)
Called pt to ask some questions concerning disability form from [employer]. Pt said she didn't need these filled out to shred them.

## 2014-03-01 ENCOUNTER — Encounter: Payer: Self-pay | Admitting: Nurse Practitioner

## 2014-03-01 ENCOUNTER — Ambulatory Visit (INDEPENDENT_AMBULATORY_CARE_PROVIDER_SITE_OTHER): Payer: BC Managed Care – PPO | Admitting: Nurse Practitioner

## 2014-03-01 VITALS — BP 135/71 | HR 67 | Temp 97.8°F | Resp 16 | Ht 65.0 in | Wt 223.4 lb

## 2014-03-01 DIAGNOSIS — Z0289 Encounter for other administrative examinations: Secondary | ICD-10-CM

## 2014-03-01 DIAGNOSIS — I10 Essential (primary) hypertension: Secondary | ICD-10-CM | POA: Insufficient documentation

## 2014-03-01 DIAGNOSIS — E039 Hypothyroidism, unspecified: Secondary | ICD-10-CM | POA: Insufficient documentation

## 2014-03-01 LAB — POCT URINALYSIS DIPSTICK
Bilirubin, UA: NEGATIVE
Blood, UA: NEGATIVE
Glucose, UA: NEGATIVE
Ketones, UA: NEGATIVE
NITRITE UA: NEGATIVE
PH UA: 6
Spec Grav, UA: 1.025
UROBILINOGEN UA: NEGATIVE

## 2014-03-01 NOTE — Patient Instructions (Signed)
Medical Examiner's Certificate  I certify that I have examined Kimberly Walsh. 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 A complete examination form with any attachment embodies my findings completely and correctly, and is on file in my office.   _________________________________________ Chevis Pretty 03/01/2014  __________________________________________ _____[id number]_____ _[place]__  Signature of Driver Driver's License No. State    Address of Driver [address] [place] [address]  Medical Certificate Expiration Date 03/02/2015

## 2014-03-01 NOTE — Progress Notes (Signed)
Commercial Driver Medical Examination   Kimberly Walsh is a 68 y.o. female who presents today for a commercial driver fitness determination physical exam. The patient reports no problems. The following portions of the patient's history were reviewed and updated as appropriate: allergies, current medications, past family history, past medical history, past social history, past surgical history and problem list. Review of Systems Pertinent items are noted in HPI.  Illness or injury last 5 years? no Head/Brain injuries, disorders or illnesses? no Seizures? no Eye disorders or impaired vision?no Ear disorders or loss of hearing or balance?no Heart disease, heart attack or other cardiovascular conditions?no Heart surgery ( valve replacement, bypass or angioplasty?no Pacemaker?no High blood pressure?yes Muscular disease?no Shortness of breath?no Lung disease, emphysema, asthma, chronic bronchitis?no Kidney disease, dialysis?no Liver disease?no Digestive problems?no Diabetes?no Nervous or psychiatric disorder/depression?no Loss of ,or altered mental status?no Fainting/ dizziness?no Sleep disorder, loud snoring, daytime sleepiness?no Stroke or paralysis?no Missing or impaired hand, arm, foot, leg fingers or toes?no Spinal injury or disease?no Chronic low back pain?no Regular or frequent alcohol use?no Narcotic or habit forming drug use?no  Outpatient Encounter Prescriptions as of 03/01/2014  Medication Sig  . aspirin 81 MG chewable tablet Chew by mouth daily.  Marland Kitchen atorvastatin (LIPITOR) 10 MG tablet TAKE 1 TABLET (10 MG TOTAL) BY MOUTH DAILY.  . BYSTOLIC 10 MG tablet TAKE ONE TABLET BY MOUTH ONE TIME DAILY  . diclofenac sodium (VOLTAREN) 1 % GEL Apply 2 g topically 4 (four) times daily.  . Elastic Bandages & Supports (V-2 HIGH COMPRESSION HOSE) MISC 1 each by Does not apply route daily.  . furosemide (LASIX) 40 MG tablet   . levothyroxine (SYNTHROID, LEVOTHROID) 25 MCG tablet 1 po  daily except hold on wed and sat  . meclizine (ANTIVERT) 25 MG tablet TAKE ONE TAB EVERY 6 HOURS AS NEEDED FOR VERTIGO  . meloxicam (MOBIC) 15 MG tablet TAKE ONE TABLET BY MOUTH ONE TIME DAILY  . ondansetron (ZOFRAN-ODT) 8 MG disintegrating tablet   . traMADol (ULTRAM) 50 MG tablet TAKE ONE TABLET BY MOUTH DAILY AS NEEDED     Objective:    Vision:  Uncorrected Corrected Horizontal Field of Vision  Right Eye  20/25 >70 degrees  Left Eye   20/25 >70 degrees  Both Eyes   78/29    Applicant can recognize and distinguish among traffic control signals and devices showing standard red, green, and amber colors.  Applicant meets visual acuity requirement only when wearing corrective lenses.  Monocular Vision?: No   Hearing: (+) whisper test at >25feet     BP 135/71  Pulse 67  Temp(Src) 97.8 F (36.6 C) (Oral)  Resp 16  Ht 5\' 5"  (1.651 m)  Wt 223 lb 6.4 oz (101.334 kg)  BMI 37.18 kg/m2  General Appearance:    Alert, cooperative, no distress, appears stated age  Head:    Normocephalic, without obvious abnormality, atraumatic  Eyes:    PERRL, conjunctiva/corneas clear, EOM's intact, fundi    benign, both eyes  Ears:    Normal TM's and external ear canals, both ears  Nose:   Nares normal, septum midline, mucosa normal, no drainage    or sinus tenderness  Throat:   Lips, mucosa, and tongue normal; teeth and gums normal  Neck:   Supple, symmetrical, trachea midline, no adenopathy;    thyroid:  no enlargement/tenderness/nodules; no carotid   bruit or JVD  Back:     Symmetric, no curvature, ROM normal, no CVA tenderness  Lungs:  Clear to auscultation bilaterally, respirations unlabored  Chest Wall:    No tenderness or deformity   Heart:    Regular rate and rhythm, S1 and S2 normal, no murmur, rub   or gallop  Breast Exam:    No tenderness, masses, or nipple abnormality  Abdomen:     Soft, non-tender, bowel sounds active all four quadrants,    no masses, no organomegaly   Genitalia:    Normal female without lesion, discharge or tenderness  Rectal:    Normal tone, normal prostate, no masses or tenderness;   guaiac negative stool  Extremities:   Extremities normal, atraumatic, no cyanosis or edema  Pulses:   2+ and symmetric all extremities  Skin:   Skin color, texture, turgor normal, no rashes or lesions  Lymph nodes:   Cervical, supraclavicular, and axillary nodes normal  Neurologic:   CNII-XII intact, normal strength, sensation and reflexes    throughout    Labs: No results found for this basename: SPECGRAV, PROTEINUR, BILIRUBINUR, GLUCOSEU      Assessment:    Healthy female exam.  Meets standards, but periodic monitoring required due to hypertension.  Driver qualified only for 1 year.    lan:    Medical examiners certificate completed and printed. Return as needed.

## 2014-04-10 ENCOUNTER — Other Ambulatory Visit: Payer: Self-pay | Admitting: *Deleted

## 2014-04-10 MED ORDER — TRAMADOL HCL 50 MG PO TABS: ORAL_TABLET | ORAL | Status: DC

## 2014-04-17 ENCOUNTER — Other Ambulatory Visit: Payer: Self-pay | Admitting: Nurse Practitioner

## 2014-05-07 ENCOUNTER — Other Ambulatory Visit: Payer: Self-pay | Admitting: Family Medicine

## 2014-05-07 ENCOUNTER — Other Ambulatory Visit: Payer: Self-pay | Admitting: General Practice

## 2014-05-07 ENCOUNTER — Other Ambulatory Visit: Payer: Self-pay | Admitting: Nurse Practitioner

## 2014-05-07 NOTE — Telephone Encounter (Signed)
Ultram rx ready for pick up  

## 2014-05-07 NOTE — Telephone Encounter (Signed)
Last filled 04/10/14, last seen 03/01/14. Rx will print

## 2014-05-08 ENCOUNTER — Other Ambulatory Visit: Payer: Self-pay | Admitting: *Deleted

## 2014-05-08 DIAGNOSIS — E039 Hypothyroidism, unspecified: Secondary | ICD-10-CM

## 2014-05-08 MED ORDER — LEVOTHYROXINE SODIUM 25 MCG PO TABS: ORAL_TABLET | ORAL | Status: DC

## 2014-05-09 ENCOUNTER — Other Ambulatory Visit: Payer: Self-pay

## 2014-05-09 ENCOUNTER — Other Ambulatory Visit: Payer: Self-pay | Admitting: General Practice

## 2014-05-09 DIAGNOSIS — E039 Hypothyroidism, unspecified: Secondary | ICD-10-CM

## 2014-05-09 MED ORDER — LEVOTHYROXINE SODIUM 25 MCG PO TABS: ORAL_TABLET | ORAL | Status: DC

## 2014-06-04 ENCOUNTER — Ambulatory Visit (INDEPENDENT_AMBULATORY_CARE_PROVIDER_SITE_OTHER): Payer: BC Managed Care – PPO | Admitting: Nurse Practitioner

## 2014-06-04 ENCOUNTER — Encounter: Payer: Self-pay | Admitting: Nurse Practitioner

## 2014-06-04 ENCOUNTER — Other Ambulatory Visit: Payer: Self-pay | Admitting: Nurse Practitioner

## 2014-06-04 VITALS — BP 140/78 | HR 66 | Temp 98.7°F | Ht 65.0 in | Wt 223.0 lb

## 2014-06-04 DIAGNOSIS — E038 Other specified hypothyroidism: Secondary | ICD-10-CM

## 2014-06-04 DIAGNOSIS — M47816 Spondylosis without myelopathy or radiculopathy, lumbar region: Secondary | ICD-10-CM

## 2014-06-04 DIAGNOSIS — I10 Essential (primary) hypertension: Secondary | ICD-10-CM

## 2014-06-04 DIAGNOSIS — R5382 Chronic fatigue, unspecified: Secondary | ICD-10-CM

## 2014-06-04 DIAGNOSIS — E034 Atrophy of thyroid (acquired): Secondary | ICD-10-CM

## 2014-06-04 MED ORDER — TRAMADOL HCL 50 MG PO TABS: 50.0000 mg | ORAL_TABLET | Freq: Every day | ORAL | Status: DC | PRN

## 2014-06-04 NOTE — Progress Notes (Signed)
Subjective:    Patient ID: Kimberly Walsh, female    DOB: 07/14/1945, 68 y.o.   MRN: [id number]   Patient here today for follow up of chronic medical problems. Her only complaint is of continual fayigue- Has seen cardiologist and he has cleared her heart.   Hypertension This is a chronic problem. The current episode started more than 1 year ago. The problem is controlled. Risk factors for coronary artery disease include post-menopausal state. Past treatments include beta blockers. The current treatment provides moderate improvement. Compliance problems include diet and exercise.  Hypertensive end-organ damage includes a thyroid problem.  Thyroid Problem Visit type: hypothyroidism. The symptoms have been stable.   DJD Ultram and mobic- works well to keep pain to a minimum Peripheral edema Lasix daily- keeps swelling down      Review of Systems  Constitutional: Negative.   HENT: Negative.   Respiratory: Negative.   Cardiovascular: Negative.   Genitourinary: Negative.   Neurological: Negative.   Psychiatric/Behavioral: Negative.   All other systems reviewed and are negative.      Objective:   Physical Exam  Constitutional: She is oriented to person, place, and time. She appears well-developed and well-nourished.  HENT:  Nose: Nose normal.  Mouth/Throat: Oropharynx is clear and moist.  Eyes: EOM are normal.  Neck: Trachea normal, normal range of motion and full passive range of motion without pain. Neck supple. No JVD present. Carotid bruit is not present. No thyromegaly present.  Cardiovascular: Normal rate, regular rhythm, normal heart sounds and intact distal pulses.  Exam reveals no gallop and no friction rub.   No murmur heard. Pulmonary/Chest: Effort normal and breath sounds normal.  Abdominal: Soft. Bowel sounds are normal. She exhibits no distension and no mass. There is no tenderness.  Musculoskeletal: Normal range of motion.  Decrease ROM of lumbar spine due to  pain on flexion (-) SLR bil  Lymphadenopathy:    She has no cervical adenopathy.  Neurological: She is alert and oriented to person, place, and time. She has normal reflexes. No cranial nerve deficit.  Skin: Skin is warm and dry.  Venous stasis ulcer right shin- going to wound clinic  Psychiatric: She has a normal mood and affect. Her behavior is normal. Judgment and thought content normal.   BP 140/78 mmHg  Pulse 66  Temp(Src) 98.7 F (37.1 C) (Oral)  Ht 5' 5" (1.651 m)  Wt 223 lb (101.152 kg)  BMI 37.11 kg/m2        Assessment & Plan:  1. Essential hypertension, benign Do not add salt to diet - CMP14+EGFR - NMR, lipoprofile  2. Hypothyroidism due to acquired atrophy of thyroid - Thyroid Panel With TSH  3. Lumbar spondylosis, unspecified spinal osteoarthritis Moist heat Back strengthening exercises - DG Bone Density; Future - traMADol (ULTRAM) 50 MG tablet; Take 1 tablet (50 mg total) by mouth daily as needed.  Dispense: 30 tablet; Refill: 0  4. Chronic fatigue Labs pending - Anemia Profile B    Labs pending Health maintenance reviewed Diet and exercise encouraged Continue all meds Follow up  In 3 months   Mary-Margaret Martin, FNP    

## 2014-06-04 NOTE — Patient Instructions (Signed)

## 2014-06-05 LAB — THYROID PANEL WITH TSH
FREE THYROXINE INDEX: 1.9 (ref 1.2–4.9)
T3 UPTAKE RATIO: 27 % (ref 24–39)
T4, Total: 7.1 ug/dL (ref 4.5–12.0)
TSH: 3.9 u[IU]/mL (ref 0.450–4.500)

## 2014-06-05 LAB — NMR, LIPOPROFILE
Cholesterol: 161 mg/dL (ref 100–199)
HDL CHOLESTEROL BY NMR: 38 mg/dL — AB (ref >39)
HDL PARTICLE NUMBER: 27.1 umol/L — AB (ref >=30.5)
LDL PARTICLE NUMBER: 1260 nmol/L — AB (ref <1000)
LDL Size: 21.2 nm (ref >20.5)
LDL-C: 72 mg/dL (ref 0–99)
LP-IR Score: 62 — ABNORMAL HIGH (ref <=45)
Small LDL Particle Number: 642 nmol/L — ABNORMAL HIGH (ref <=527)
Triglycerides by NMR: 254 mg/dL — ABNORMAL HIGH (ref 0–149)

## 2014-06-05 LAB — ANEMIA PROFILE B
BASOS: 0 %
Basophils Absolute: 0 10*3/uL (ref 0.0–0.2)
EOS: 5 %
Eosinophils Absolute: 0.4 10*3/uL (ref 0.0–0.4)
FERRITIN: 187 ng/mL — AB (ref 15–150)
Folate: 15.8 ng/mL (ref >3.0)
HEMATOCRIT: 42.3 % (ref 34.0–46.6)
HEMOGLOBIN: 14.2 g/dL (ref 11.1–15.9)
Immature Grans (Abs): 0 10*3/uL (ref 0.0–0.1)
Immature Granulocytes: 0 %
Iron Saturation: 19 % (ref 15–55)
Iron: 48 ug/dL (ref 35–155)
LYMPHS ABS: 2.7 10*3/uL (ref 0.7–3.1)
Lymphs: 36 %
MCH: 29.5 pg (ref 26.6–33.0)
MCHC: 33.6 g/dL (ref 31.5–35.7)
MCV: 88 fL (ref 79–97)
MONOCYTES: 7 %
Monocytes Absolute: 0.5 10*3/uL (ref 0.1–0.9)
Neutrophils Absolute: 3.9 10*3/uL (ref 1.4–7.0)
Neutrophils Relative %: 52 %
Platelets: 225 10*3/uL (ref 150–379)
RBC: 4.82 x10E6/uL (ref 3.77–5.28)
RDW: 14.4 % (ref 12.3–15.4)
Retic Ct Pct: 2.4 % (ref 0.6–2.6)
TIBC: 259 ug/dL (ref 250–450)
UIBC: 211 ug/dL (ref 150–375)
Vitamin B-12: 319 pg/mL (ref 211–946)
WBC: 7.4 10*3/uL (ref 3.4–10.8)

## 2014-06-05 LAB — CMP14+EGFR
A/G RATIO: 1.4 (ref 1.1–2.5)
ALT: 26 IU/L (ref 0–32)
AST: 24 IU/L (ref 0–40)
Albumin: 4 g/dL (ref 3.6–4.8)
Alkaline Phosphatase: 104 IU/L (ref 39–117)
BUN/Creatinine Ratio: 17 (ref 11–26)
BUN: 19 mg/dL (ref 8–27)
CALCIUM: 9.5 mg/dL (ref 8.7–10.3)
CO2: 24 mmol/L (ref 18–29)
Chloride: 100 mmol/L (ref 97–108)
Creatinine, Ser: 1.11 mg/dL — ABNORMAL HIGH (ref 0.57–1.00)
GFR, EST AFRICAN AMERICAN: 59 mL/min/{1.73_m2} — AB (ref >59)
GFR, EST NON AFRICAN AMERICAN: 52 mL/min/{1.73_m2} — AB (ref >59)
GLOBULIN, TOTAL: 2.9 g/dL (ref 1.5–4.5)
GLUCOSE: 110 mg/dL — AB (ref 65–99)
Potassium: 3.7 mmol/L (ref 3.5–5.2)
SODIUM: 141 mmol/L (ref 134–144)
Total Bilirubin: 0.4 mg/dL (ref 0.0–1.2)
Total Protein: 6.9 g/dL (ref 6.0–8.5)

## 2014-06-22 DIAGNOSIS — C801 Malignant (primary) neoplasm, unspecified: Secondary | ICD-10-CM

## 2014-06-22 HISTORY — DX: Malignant (primary) neoplasm, unspecified: C80.1

## 2014-07-12 ENCOUNTER — Other Ambulatory Visit: Payer: Self-pay | Admitting: *Deleted

## 2014-07-12 MED ORDER — NEBIVOLOL HCL 10 MG PO TABS: 10.0000 mg | ORAL_TABLET | Freq: Every day | ORAL | Status: DC

## 2014-07-15 ENCOUNTER — Other Ambulatory Visit: Payer: Self-pay | Admitting: Nurse Practitioner

## 2014-08-01 ENCOUNTER — Ambulatory Visit (INDEPENDENT_AMBULATORY_CARE_PROVIDER_SITE_OTHER): Payer: BLUE CROSS/BLUE SHIELD | Admitting: Pharmacist

## 2014-08-01 ENCOUNTER — Ambulatory Visit (INDEPENDENT_AMBULATORY_CARE_PROVIDER_SITE_OTHER): Payer: BLUE CROSS/BLUE SHIELD

## 2014-08-01 DIAGNOSIS — Z13820 Encounter for screening for osteoporosis: Secondary | ICD-10-CM

## 2014-08-01 DIAGNOSIS — M47816 Spondylosis without myelopathy or radiculopathy, lumbar region: Secondary | ICD-10-CM

## 2014-08-01 DIAGNOSIS — E034 Atrophy of thyroid (acquired): Secondary | ICD-10-CM

## 2014-08-01 DIAGNOSIS — E038 Other specified hypothyroidism: Secondary | ICD-10-CM

## 2014-08-01 LAB — HM DEXA SCAN

## 2014-08-01 NOTE — Progress Notes (Signed)
Patient ID: Kimberly Walsh, female   DOB: 1945-09-15, 69 y.o.   MRN: [id number]  Patient did not stay for appt after DEXA.  Patient was called on phone with results and recommendations mentioned below.  Recommended increase calcium intake wither through diet or supplementation - goal is 1200mg  daily. Recommended weight bearing exercise.  Recheck DEXA in 3 to 5 years.

## 2014-08-10 ENCOUNTER — Other Ambulatory Visit: Payer: Self-pay | Admitting: Nurse Practitioner

## 2014-08-10 NOTE — Telephone Encounter (Signed)
Last filled 06/07/14, last seen 06/04/14.

## 2014-08-10 NOTE — Telephone Encounter (Signed)
Ultram rx ready for pick up  

## 2014-09-03 ENCOUNTER — Ambulatory Visit (INDEPENDENT_AMBULATORY_CARE_PROVIDER_SITE_OTHER): Payer: BLUE CROSS/BLUE SHIELD | Admitting: Nurse Practitioner

## 2014-09-03 ENCOUNTER — Encounter: Payer: Self-pay | Admitting: Nurse Practitioner

## 2014-09-03 VITALS — BP 134/69 | HR 54 | Temp 97.1°F | Ht 65.0 in | Wt 231.0 lb

## 2014-09-03 DIAGNOSIS — R0600 Dyspnea, unspecified: Secondary | ICD-10-CM

## 2014-09-03 DIAGNOSIS — E038 Other specified hypothyroidism: Secondary | ICD-10-CM

## 2014-09-03 DIAGNOSIS — M47816 Spondylosis without myelopathy or radiculopathy, lumbar region: Secondary | ICD-10-CM | POA: Diagnosis not present

## 2014-09-03 DIAGNOSIS — E034 Atrophy of thyroid (acquired): Secondary | ICD-10-CM

## 2014-09-03 DIAGNOSIS — I10 Essential (primary) hypertension: Secondary | ICD-10-CM | POA: Diagnosis not present

## 2014-09-03 DIAGNOSIS — R5382 Chronic fatigue, unspecified: Secondary | ICD-10-CM

## 2014-09-03 MED ORDER — TRAMADOL HCL 50 MG PO TABS: 50.0000 mg | ORAL_TABLET | Freq: Every day | ORAL | Status: DC | PRN

## 2014-09-03 MED ORDER — V-2 HIGH COMPRESSION HOSE MISC: 1.0000 | Freq: Every day | Status: DC

## 2014-09-03 MED ORDER — VALSARTAN 160 MG PO TABS: 160.0000 mg | ORAL_TABLET | Freq: Every day | ORAL | Status: DC

## 2014-09-03 NOTE — Patient Instructions (Signed)

## 2014-09-03 NOTE — Progress Notes (Signed)
Subjective:    Patient ID: Kimberly Walsh, female    DOB: Feb 19, 1946, 69 y.o.   MRN: [id number]   Patient here today for follow up of chronic medical problems. Her only complaint is of continual fatigue- has been release by her cardiologist.    Hypertension This is a chronic problem. The current episode started more than 1 year ago. The problem is controlled. Risk factors for coronary artery disease include post-menopausal state. Past treatments include beta blockers. The current treatment provides moderate improvement. Compliance problems include diet and exercise.  Hypertensive end-organ damage includes a thyroid problem.  Thyroid Problem Visit type: hypothyroidism. The symptoms have been stable. The treatment provided significant relief.   DJD Ultram and mobic- works well to keep pain to a minimum Peripheral edema Lasix daily- keeps swelling down      Review of Systems  Constitutional: Negative.   HENT: Negative.   Respiratory: Negative.   Cardiovascular: Negative.   Genitourinary: Negative.   Neurological: Negative.   Psychiatric/Behavioral: Negative.   All other systems reviewed and are negative.      Objective:   Physical Exam  Constitutional: She is oriented to person, place, and time. She appears well-developed and well-nourished.  HENT:  Nose: Nose normal.  Mouth/Throat: Oropharynx is clear and moist.  Eyes: EOM are normal.  Neck: Trachea normal, normal range of motion and full passive range of motion without pain. Neck supple. No JVD present. Carotid bruit is not present. No thyromegaly present.  Cardiovascular: Normal rate, regular rhythm, normal heart sounds and intact distal pulses.  Exam reveals no gallop and no friction rub.   No murmur heard. Pulmonary/Chest: Effort normal and breath sounds normal.  Abdominal: Soft. Bowel sounds are normal. She exhibits no distension and no mass. There is no tenderness.  Musculoskeletal: Normal range of motion. She  exhibits edema (BIL lower leg +2. ).  Decrease ROM of lumbar spine due to pain on flexion (-) SLR bil  Lymphadenopathy:    She has no cervical adenopathy.  Neurological: She is alert and oriented to person, place, and time. She has normal reflexes. No cranial nerve deficit.  Skin: Skin is warm and dry.  Venous stasis ulcer right shin- going to wound clinic  Psychiatric: She has a normal mood and affect. Her behavior is normal. Judgment and thought content normal.   BP 134/69 mmHg  Pulse 54  Temp(Src) 97.1 F (36.2 C) (Oral)  Ht _0  (1.651 m)  Wt 231 lb (104.781 kg)  BMI 38.44 kg/m2        Assessment & Plan:   1. Essential hypertension, benign   2. Hypothyroidism due to acquired atrophy of thyroid   3. Dyspnea   4. Lumbar spondylosis, unspecified spinal osteoarthritis   5. Chronic fatigue    Orders Placed This Encounter  Procedures  . CMP14+EGFR  . NMR, lipoprofile   Meds ordered this encounter  Medications  . traMADol (ULTRAM) 50 MG tablet    Sig: Take 1 tablet (50 mg total) by mouth daily as needed.    Dispense:  30 tablet    Refill:  0    DO NOT FILL TILL 09/07/14    Order Specific Question:  Supervising Provider    Answer:  Chipper Herb [[id number]]  . Elastic Bandages & Supports (V-2 HIGH COMPRESSION HOSE) MISC    Sig: 1 each by Does not apply route daily.    Dispense:  1 each    Refill:  0  Order Specific Question:  Supervising Provider    Answer:  Chipper Herb [[id number]]  . valsartan (DIOVAN) 160 MG tablet    Sig: Take 1 tablet (160 mg total) by mouth daily.    Dispense:  30 tablet    Refill:  5    Order Specific Question:  Supervising Provider    Answer:  MOORE, DONALD W [[id number]]   Stopped bystolic due to expense- changed to diovan 172m daily- keep diary of blood pressure Wear compression stocking  hemoccult cards given to patient with directions Labs pending Health maintenance reviewed Diet and exercise encouraged Continue all meds Follow up  In 3  months    [place] FNP

## 2014-09-09 ENCOUNTER — Other Ambulatory Visit: Payer: Self-pay | Admitting: Family Medicine

## 2014-09-09 ENCOUNTER — Other Ambulatory Visit: Payer: Self-pay | Admitting: Nurse Practitioner

## 2014-10-12 ENCOUNTER — Other Ambulatory Visit: Payer: Self-pay | Admitting: Nurse Practitioner

## 2014-10-12 NOTE — Telephone Encounter (Signed)
Last seen 09/03/14 MMM  If approved route to nurse to call into [facility]

## 2014-10-12 NOTE — Telephone Encounter (Signed)
lmovm that written Rx was ready at front desk for pick up

## 2014-10-12 NOTE — Telephone Encounter (Signed)
Ultram rx ready for pick up  

## 2014-11-08 ENCOUNTER — Other Ambulatory Visit: Payer: Self-pay | Admitting: Nurse Practitioner

## 2014-11-08 NOTE — Telephone Encounter (Signed)
Ultram rx ready for pick up  

## 2014-11-08 NOTE — Telephone Encounter (Signed)
Last seen 09/03/14 MMM If approved print

## 2014-11-08 NOTE — Telephone Encounter (Signed)
Pt notified RX is ready for pick up 

## 2014-12-04 ENCOUNTER — Encounter: Payer: Self-pay | Admitting: Physician Assistant

## 2014-12-04 ENCOUNTER — Ambulatory Visit (INDEPENDENT_AMBULATORY_CARE_PROVIDER_SITE_OTHER): Payer: BLUE CROSS/BLUE SHIELD | Admitting: Physician Assistant

## 2014-12-04 VITALS — BP 136/72 | HR 75 | Temp 97.0°F | Ht 65.0 in | Wt 229.0 lb

## 2014-12-04 DIAGNOSIS — R5383 Other fatigue: Secondary | ICD-10-CM

## 2014-12-04 DIAGNOSIS — T148 Other injury of unspecified body region: Secondary | ICD-10-CM | POA: Diagnosis not present

## 2014-12-04 DIAGNOSIS — L089 Local infection of the skin and subcutaneous tissue, unspecified: Secondary | ICD-10-CM

## 2014-12-04 DIAGNOSIS — E039 Hypothyroidism, unspecified: Secondary | ICD-10-CM

## 2014-12-04 DIAGNOSIS — A499 Bacterial infection, unspecified: Secondary | ICD-10-CM | POA: Diagnosis not present

## 2014-12-04 DIAGNOSIS — L570 Actinic keratosis: Secondary | ICD-10-CM | POA: Diagnosis not present

## 2014-12-04 DIAGNOSIS — T148XXA Other injury of unspecified body region, initial encounter: Secondary | ICD-10-CM

## 2014-12-04 DIAGNOSIS — B9689 Other specified bacterial agents as the cause of diseases classified elsewhere: Secondary | ICD-10-CM

## 2014-12-04 LAB — POCT CBC
Granulocyte percent: 65.9 %G (ref 37–80)
HEMATOCRIT: 41.3 % (ref 37.7–47.9)
Hemoglobin: 13.3 g/dL (ref 12.2–16.2)
Lymph, poc: 1.7 (ref 0.6–3.4)
MCH, POC: 28 pg (ref 27–31.2)
MCHC: 32.1 g/dL (ref 31.8–35.4)
MCV: 87.2 fL (ref 80–97)
MPV: 6.7 fL (ref 0–99.8)
POC GRANULOCYTE: 4.3 (ref 2–6.9)
POC LYMPH %: 26.8 % (ref 10–50)
Platelet Count, POC: 228 10*3/uL (ref 142–424)
RBC: 4.74 M/uL (ref 4.04–5.48)
RDW, POC: 14.6 %
WBC: 6.5 10*3/uL (ref 4.6–10.2)

## 2014-12-04 MED ORDER — SULFAMETHOXAZOLE-TRIMETHOPRIM 800-160 MG PO TABS: 1.0000 | ORAL_TABLET | Freq: Two times a day (BID) | ORAL | Status: DC

## 2014-12-04 NOTE — Progress Notes (Signed)
   Subjective:    Patient ID: Kimberly Walsh, female    DOB: 04-09-1946, 69 y.o.   MRN: [id number]  HPI 69 y/o female presents with c/o nonhealing lesion on right posterior calf. She went to the wound center 05/2014 but lesion has not healed. She has chronic RLE edema.   Scaling lesion on left ear and left jawline   Chronic headaches - relieved by Excedrin  Increased fatigue - h/o hypothyroidism. Last labs July 2015    Review of Systems  Constitutional: Positive for activity change and fatigue.  Skin: Positive for wound (nonhealing lesion on posterior right calf).  Neurological: Positive for headaches.  All other systems reviewed and are negative.      Objective:   Physical Exam  Constitutional: She is oriented to person, place, and time. She appears well-developed and well-nourished. No distress.  Cardiovascular: Normal rate, regular rhythm and normal heart sounds.   Pulmonary/Chest: Effort normal.  Musculoskeletal:  RLE edema with abrasion on posterior thigh with scab. +erythema. Non-ulcerative  Hyperkeratotic lesion on left ear and jawline  Neurological: She is alert and oriented to person, place, and time.  Skin: She is not diaphoretic.  Psychiatric: She has a normal mood and affect. Her behavior is normal. Judgment and thought content normal.  Nursing note and vitals reviewed.         Assessment & Plan:  1. Skin infection, bacterial  - sulfamethoxazole-trimethoprim (BACTRIM DS,SEPTRA DS) 800-160 MG per tablet; Take 1 tablet by mouth 2 (two) times daily.  Dispense: 24 tablet; Refill: 0 - Aerobic culture  2. Nonhealing nonsurgical wound  - sulfamethoxazole-trimethoprim (BACTRIM DS,SEPTRA DS) 800-160 MG per tablet; Take 1 tablet by mouth 2 (two) times daily.  Dispense: 24 tablet; Refill: 0 - Aerobic culture - Will have patient rtc in 2 weeks to determine if add'l treatment is needed with possible Unna wrap or biopsy to r/o BCC/SCC  3. Hypothyroidism,  unspecified hypothyroidism type  - Thyroid Panel With TSH  4. Other fatigue  - POCT CBC - Thyroid Panel With TSH  5. Actinic Keratosis - Areas on left ear and left jawline treated with cryosurgery x 2    Continue all meds Labs pending Health Maintenance reviewed Diet and exercise encouraged RTO 2 weeks   Envy Meno A. Benjamin Stain PA-C

## 2014-12-05 LAB — THYROID PANEL WITH TSH
Free Thyroxine Index: 1.1 — ABNORMAL LOW (ref 1.2–4.9)
T3 UPTAKE RATIO: 26 % (ref 24–39)
T4 TOTAL: 4.3 ug/dL — AB (ref 4.5–12.0)
TSH: 3.88 u[IU]/mL (ref 0.450–4.500)

## 2014-12-06 LAB — AEROBIC CULTURE

## 2014-12-08 ENCOUNTER — Other Ambulatory Visit: Payer: Self-pay | Admitting: Nurse Practitioner

## 2014-12-18 ENCOUNTER — Encounter: Payer: Self-pay | Admitting: Physician Assistant

## 2014-12-18 ENCOUNTER — Ambulatory Visit (INDEPENDENT_AMBULATORY_CARE_PROVIDER_SITE_OTHER): Payer: BLUE CROSS/BLUE SHIELD | Admitting: Physician Assistant

## 2014-12-18 VITALS — BP 165/80 | HR 92 | Temp 97.0°F | Ht 65.0 in | Wt 226.0 lb

## 2014-12-18 DIAGNOSIS — Z9889 Other specified postprocedural states: Secondary | ICD-10-CM

## 2014-12-18 DIAGNOSIS — L089 Local infection of the skin and subcutaneous tissue, unspecified: Secondary | ICD-10-CM

## 2014-12-18 DIAGNOSIS — N950 Postmenopausal bleeding: Secondary | ICD-10-CM | POA: Diagnosis not present

## 2014-12-18 DIAGNOSIS — D485 Neoplasm of uncertain behavior of skin: Secondary | ICD-10-CM

## 2014-12-18 MED ORDER — MUPIROCIN 2 % EX OINT: 1.0000 "application " | TOPICAL_OINTMENT | Freq: Two times a day (BID) | CUTANEOUS | Status: DC

## 2014-12-18 MED ORDER — MUPIROCIN 2 % EX OINT: TOPICAL_OINTMENT | CUTANEOUS | Status: DC

## 2014-12-18 NOTE — Progress Notes (Signed)
   Subjective:    Patient ID: Kimberly Walsh, female    DOB: Mar 14, 1946, 69 y.o.   MRN: [id number]  HPI68 y/o female presents for follow up of nonlesion lesion on right posterior leg   She also has a c/o post -menopausal bleeding. She has been spotting x 1 week. She has not had a period in 10 years. Last gynecological exam was several years ago.     Review of Systems  Gastrointestinal: Negative for abdominal pain.  Genitourinary: Positive for vaginal bleeding and menstrual problem. Negative for vaginal pain.  Skin: Positive for wound (scaling, nonhealing lesion on posterior right leg ).       Objective:   Physical Exam  Constitutional: She is oriented to person, place, and time. She appears well-developed and well-nourished.  Neurological: She is alert and oriented to person, place, and time.  Skin:  Ulcerative lesion with scale on posterior right calf. Approximately 2cm in diameter Suspicious for BCCl/SCC   Psychiatric: She has a normal mood and affect. Her behavior is normal. Judgment and thought content normal.          Assessment & Plan:  1. Postmenopausal bleeding  - Ambulatory referral to Obstetrics / Gynecology  2. S/P skin biopsy  - mupirocin ointment (BACTROBAN) 2 %; Apply to AA BID x 14 days  Dispense: 22 g; Refill: 1  3. Skin infection  - mupirocin ointment (BACTROBAN) 2 %; Apply to AA BID x 14 days  Dispense: 22 g; Refill: 1  4. Neoplasm of uncertain behavior of skin  - Pathology via shave excision on posterior right calf  <1cm  in size    Continue all meds Labs pending Health Maintenance reviewed Diet and exercise encouraged RTO pending path results   Cayleb Jarnigan A. Benjamin Stain PA-C

## 2014-12-20 LAB — PATHOLOGY

## 2014-12-31 ENCOUNTER — Encounter: Payer: Self-pay | Admitting: Physician Assistant

## 2014-12-31 ENCOUNTER — Ambulatory Visit (INDEPENDENT_AMBULATORY_CARE_PROVIDER_SITE_OTHER): Payer: BLUE CROSS/BLUE SHIELD | Admitting: Physician Assistant

## 2014-12-31 VITALS — BP 139/66 | HR 87 | Ht 65.0 in | Wt 228.0 lb

## 2014-12-31 DIAGNOSIS — L98491 Non-pressure chronic ulcer of skin of other sites limited to breakdown of skin: Secondary | ICD-10-CM

## 2014-12-31 DIAGNOSIS — D485 Neoplasm of uncertain behavior of skin: Secondary | ICD-10-CM | POA: Diagnosis not present

## 2014-12-31 MED ORDER — TRAMADOL HCL 50 MG PO TABS: 50.0000 mg | ORAL_TABLET | Freq: Every day | ORAL | Status: DC | PRN

## 2014-12-31 NOTE — Progress Notes (Signed)
Patient ID: LYRIS HITCHMAN, female   DOB: [birth year], 69 y.o.   MRN: [id number]  69 y/o female presents for additional biopsy on posterior RLE after having previous biopsy on 12/18/14 that indicated needed larger biopsy on pathology to r/o basosquamous cell skin cancer.   Patient has significant LE edema d/t venous stasis. Pending path results I will determine if this can be treated in office or will need to be referred to [place] for excision.   1. Neoplasm of uncertain behavior of skin - 1cm lesion on posterior right leg biopsied via wider shave to r/o basosquamous cell skin CA. Nonstick gauze applied post biopsy. Advise dpatient to apply Mupirocin ointment BID-TID x 14 days.  - Pathology  2. Nonhealing skin ulcer, limited to breakdown of skin  - Pathology  F/U determined on path results   Glennice Marcos A. Benjamin Stain PA-C

## 2015-01-02 ENCOUNTER — Other Ambulatory Visit: Payer: Self-pay | Admitting: Nurse Practitioner

## 2015-01-04 LAB — PATHOLOGY

## 2015-01-07 ENCOUNTER — Telehealth: Payer: Self-pay | Admitting: Physician Assistant

## 2015-01-07 DIAGNOSIS — C4491 Basal cell carcinoma of skin, unspecified: Secondary | ICD-10-CM

## 2015-01-07 NOTE — Telephone Encounter (Signed)
Pt is aware and referral put in for [facility] per pt's request.

## 2015-01-10 ENCOUNTER — Telehealth: Payer: Self-pay | Admitting: Physician Assistant

## 2015-01-11 NOTE — Telephone Encounter (Signed)
Notes faxed.

## 2015-02-04 ENCOUNTER — Encounter: Payer: Self-pay | Admitting: Family Medicine

## 2015-02-04 ENCOUNTER — Ambulatory Visit (INDEPENDENT_AMBULATORY_CARE_PROVIDER_SITE_OTHER): Payer: BLUE CROSS/BLUE SHIELD | Admitting: Family Medicine

## 2015-02-04 VITALS — BP 129/74 | HR 85 | Temp 97.6°F | Ht 65.0 in | Wt 226.2 lb

## 2015-02-04 DIAGNOSIS — J200 Acute bronchitis due to Mycoplasma pneumoniae: Secondary | ICD-10-CM

## 2015-02-04 DIAGNOSIS — J069 Acute upper respiratory infection, unspecified: Secondary | ICD-10-CM | POA: Diagnosis not present

## 2015-02-04 MED ORDER — DOXYCYCLINE HYCLATE 100 MG PO TABS: 100.0000 mg | ORAL_TABLET | Freq: Two times a day (BID) | ORAL | Status: DC

## 2015-02-04 MED ORDER — BENZONATATE 100 MG PO CAPS: 100.0000 mg | ORAL_CAPSULE | Freq: Three times a day (TID) | ORAL | Status: DC | PRN

## 2015-02-04 MED ORDER — BETAMETHASONE SOD PHOS & ACET 6 (3-3) MG/ML IJ SUSP
6.0000 mg | Freq: Once | INTRAMUSCULAR | Status: AC
  Administered 2015-02-04: 6 mg via INTRAMUSCULAR

## 2015-02-04 NOTE — Progress Notes (Signed)
Subjective:  Patient ID: Kimberly Walsh, female    DOB: 1945/11/21  Age: 69 y.o. MRN: [id number]  CC: Sore Throat; Cough; and Muscle Pain   HPI CHIA MOWERS presents for onset yesterday of nonproductive cough. Over last two weeks more arthralgias. ST onset yesterday. Swallowing okay. Decreased appettie. Burning in chest. A little dyspneic. Denies fever.  History Samaya has a past medical history of Hyperlipidemia; Venous stasis; and Vertigo.   She has past surgical history that includes Middle ear surgery; Finger surgery (Right); Cholecystectomy; Tubal ligation; and Vein Surgery.   Her family history includes COPD in her father.She reports that she has never smoked. She does not have any smokeless tobacco history on file. She reports that she does not drink alcohol or use illicit drugs.  Outpatient Prescriptions Prior to Visit  Medication Sig Dispense Refill  . atorvastatin (LIPITOR) 10 MG tablet TAKE 1 TABLET (10 MG TOTAL) BY MOUTH DAILY. 30 tablet 5  . furosemide (LASIX) 40 MG tablet TAKE ONE TABLET BY MOUTH ONE TIME DAILY 30 tablet 3  . levothyroxine (SYNTHROID, LEVOTHROID) 25 MCG tablet 1 po daily as directed 30 tablet 9  . meclizine (ANTIVERT) 25 MG tablet TAKE ONE TAB EVERY 6 HOURS AS NEEDED FOR VERTIGO 60 tablet 0  . meloxicam (MOBIC) 15 MG tablet TAKE ONE TABLET BY MOUTH ONE TIME DAILY 30 tablet 2  . traMADol (ULTRAM) 50 MG tablet Take 1 tablet (50 mg total) by mouth daily as needed. 30 tablet 0  . valsartan (DIOVAN) 160 MG tablet Take 1 tablet (160 mg total) by mouth daily. 30 tablet 5  . Elastic Bandages & Supports (V-2 HIGH COMPRESSION HOSE) MISC 1 each by Does not apply route daily. 1 each 0  . mupirocin ointment (BACTROBAN) 2 % Apply to AA BID x 14 days 22 g 1   No facility-administered medications prior to visit.    ROS Review of Systems  Constitutional: Negative for fever, chills, activity change and appetite change.  HENT: Positive for congestion, postnasal  drip, rhinorrhea and sinus pressure. Negative for ear discharge, ear pain, hearing loss, nosebleeds, sneezing and trouble swallowing.   Respiratory: Negative for chest tightness and shortness of breath.   Cardiovascular: Negative for chest pain and palpitations.  Skin: Negative for rash.    Objective:  BP 129/74 mmHg  Pulse 85  Temp(Src) 97.6 F (36.4 C) (Oral)  Ht 5\' 5"  (1.651 m)  Wt 226 lb 3.2 oz (102.604 kg)  BMI 37.64 kg/m2  BP Readings from Last 3 Encounters:  02/04/15 129/74  12/31/14 139/66  12/18/14 165/80    Wt Readings from Last 3 Encounters:  02/04/15 226 lb 3.2 oz (102.604 kg)  12/31/14 228 lb (103.42 kg)  12/18/14 226 lb (102.513 kg)     Physical Exam  Constitutional: She appears well-developed and well-nourished.  HENT:  Head: Normocephalic and atraumatic.  Right Ear: Tympanic membrane and external ear normal. No decreased hearing is noted.  Left Ear: Tympanic membrane and external ear normal. No decreased hearing is noted.  Nose: Mucosal edema present. Right sinus exhibits no frontal sinus tenderness. Left sinus exhibits no frontal sinus tenderness.  Mouth/Throat: No oropharyngeal exudate or posterior oropharyngeal erythema.  Neck: No Brudzinski's sign noted.  Pulmonary/Chest: Breath sounds normal. No respiratory distress.  Lymphadenopathy:       Head (right side): No preauricular adenopathy present.       Head (left side): No preauricular adenopathy present.       Right cervical: No  superficial cervical adenopathy present.      Left cervical: No superficial cervical adenopathy present.    No results found for: HGBA1C  Lab Results  Component Value Date   WBC 6.5 12/04/2014   HGB 13.3 12/04/2014   HCT 41.3 12/04/2014   PLT 225 06/04/2014   GLUCOSE 110* 06/04/2014   CHOL 161 06/04/2014   TRIG 254* 06/04/2014   HDL 38* 06/04/2014   LDLCALC 84 02/22/2014   ALT 26 06/04/2014   AST 24 06/04/2014   NA 141 06/04/2014   K 3.7 06/04/2014   CL 100  06/04/2014   CREATININE 1.11* 06/04/2014   BUN 19 06/04/2014   CO2 24 06/04/2014   TSH 3.880 12/04/2014    Dg Chest 2 View  10/03/2013   CLINICAL DATA:  Cough.  EXAM: CHEST  2 VIEW  COMPARISON:  01/26/2013  FINDINGS: Lungs are hypoinflated with mild opacification in the left retrocardiac region seen in the infrahilar location on the lateral film may as this may be due to atelectasis early infection. There is no evidence of effusion. There is mild stable cardiomegaly. There are degenerative changes of the spine.  IMPRESSION: Mild opacification over the anterior segment left lower lobe which may be due to atelectasis versus infection.   Electronically Signed   By: Marin Olp M.D.   On: 10/03/2013 08:09   Ct Angio Chest W/cm &/or Wo Cm  10/02/2013   CLINICAL DATA:  Dyspnea, cough, right lower extremity edema.  EXAM: CT ANGIOGRAPHY CHEST WITH CONTRAST  TECHNIQUE: Multidetector CT imaging of the chest was performed using the standard protocol during bolus administration of intravenous contrast. Multiplanar CT image reconstructions and MIPs were obtained to evaluate the vascular anatomy.  CONTRAST:  61mL OMNIPAQUE IOHEXOL 350 MG/ML SOLN  COMPARISON:  DG CHEST 2V dated 10/02/2013  FINDINGS: The pulmonary arteries are well opacified. There is no evidence of pulmonary embolus. Lungs show no evidence of edema, infiltrate or nodule. The heart is mildly enlarged. There is dense calcification at the level of the mitral valve annulus. No pleural or pericardial fluid is identified.  No enlarged lymph nodes are seen. Degenerative changes are present in the lower thoracic spine.  Review of the MIP images confirms the above findings.  IMPRESSION: 1. No evidence of pulmonary embolism. 2. Cardiomegaly and mitral annular calcification. No pulmonary edema is identified.   Electronically Signed   By: Aletta Edouard M.D.   On: 10/02/2013 19:14    Assessment & Plan:   Camaryn was seen today for sore throat, cough and muscle  pain.  Diagnoses and all orders for this visit:  Acute bronchitis due to Mycoplasma pneumoniae -     betamethasone acetate-betamethasone sodium phosphate (CELESTONE) injection 6 mg; Inject 1 mL (6 mg total) into the muscle once.  URI, acute -     benzonatate (TESSALON PERLES) 100 MG capsule; Take 1 capsule (100 mg total) by mouth 3 (three) times daily as needed for cough. -     betamethasone acetate-betamethasone sodium phosphate (CELESTONE) injection 6 mg; Inject 1 mL (6 mg total) into the muscle once.  Other orders -     doxycycline (VIBRA-TABS) 100 MG tablet; Take 1 tablet (100 mg total) by mouth 2 (two) times daily.   I have discontinued Ms. Geremia's V-2 HIGH COMPRESSION HOSE and mupirocin ointment. I am also having her start on doxycycline. Additionally, I am having her maintain her furosemide, levothyroxine, meclizine, valsartan, atorvastatin, meloxicam, traMADol, and benzonatate. We administered betamethasone acetate-betamethasone sodium phosphate.  Meds ordered this encounter  Medications  . doxycycline (VIBRA-TABS) 100 MG tablet    Sig: Take 1 tablet (100 mg total) by mouth 2 (two) times daily.    Dispense:  20 tablet    Refill:  0  . benzonatate (TESSALON PERLES) 100 MG capsule    Sig: Take 1 capsule (100 mg total) by mouth 3 (three) times daily as needed for cough.    Dispense:  20 capsule    Refill:  1  . betamethasone acetate-betamethasone sodium phosphate (CELESTONE) injection 6 mg    Sig:      Follow-up: Return if symptoms worsen or fail to improve.  Claretta Fraise, M.D.

## 2015-02-05 ENCOUNTER — Encounter: Payer: BLUE CROSS/BLUE SHIELD | Admitting: Obstetrics and Gynecology

## 2015-02-10 ENCOUNTER — Other Ambulatory Visit: Payer: Self-pay | Admitting: Physician Assistant

## 2015-02-11 NOTE — Telephone Encounter (Signed)
Last filled 01/12/15, last seen 12/31/14. Rx will print

## 2015-02-12 NOTE — Telephone Encounter (Signed)
rx up front to be picked up

## 2015-02-26 ENCOUNTER — Ambulatory Visit (INDEPENDENT_AMBULATORY_CARE_PROVIDER_SITE_OTHER): Payer: BLUE CROSS/BLUE SHIELD | Admitting: Nurse Practitioner

## 2015-02-26 ENCOUNTER — Encounter: Payer: Self-pay | Admitting: Nurse Practitioner

## 2015-02-26 VITALS — Temp 98.6°F | Ht 66.0 in | Wt 229.0 lb

## 2015-02-26 DIAGNOSIS — Z024 Encounter for examination for driving license: Secondary | ICD-10-CM

## 2015-02-26 LAB — POCT URINALYSIS DIPSTICK
BILIRUBIN UA: NEGATIVE
Blood, UA: NEGATIVE
GLUCOSE UA: NEGATIVE
Ketones, UA: NEGATIVE
Leukocytes, UA: NEGATIVE
Nitrite, UA: NEGATIVE
SPEC GRAV UA: 1.015
Urobilinogen, UA: NEGATIVE
pH, UA: 7

## 2015-02-26 NOTE — Progress Notes (Signed)
Patient ID: Kimberly Walsh, female   DOB: 26-May-1946, 69 y.o.   MRN: [id number]   DOT physical- see scanned in report

## 2015-03-14 ENCOUNTER — Other Ambulatory Visit: Payer: Self-pay | Admitting: Physician Assistant

## 2015-03-14 ENCOUNTER — Other Ambulatory Visit: Payer: Self-pay | Admitting: Nurse Practitioner

## 2015-03-18 ENCOUNTER — Other Ambulatory Visit: Payer: Self-pay | Admitting: Nurse Practitioner

## 2015-03-18 ENCOUNTER — Other Ambulatory Visit: Payer: Self-pay | Admitting: Physician Assistant

## 2015-04-08 ENCOUNTER — Ambulatory Visit: Payer: BLUE CROSS/BLUE SHIELD | Admitting: Nurse Practitioner

## 2015-04-16 ENCOUNTER — Other Ambulatory Visit: Payer: Self-pay | Admitting: Physician Assistant

## 2015-04-16 NOTE — Telephone Encounter (Signed)
Last seen 02/26/15  MMM  If approved print

## 2015-04-17 ENCOUNTER — Other Ambulatory Visit: Payer: Self-pay | Admitting: Nurse Practitioner

## 2015-04-17 NOTE — Telephone Encounter (Signed)
Last seen 02/26/15 MMM  Last lipid 09/03/14

## 2015-04-18 ENCOUNTER — Ambulatory Visit: Payer: BLUE CROSS/BLUE SHIELD | Admitting: Nurse Practitioner

## 2015-04-18 NOTE — Telephone Encounter (Signed)
Called patient but there was no answer and VM not setup.

## 2015-04-18 NOTE — Telephone Encounter (Signed)
Script is up front for pick up .

## 2015-04-18 NOTE — Telephone Encounter (Signed)
rx ready for pickup 

## 2015-04-22 ENCOUNTER — Ambulatory Visit: Payer: BLUE CROSS/BLUE SHIELD | Admitting: Nurse Practitioner

## 2015-05-15 ENCOUNTER — Other Ambulatory Visit: Payer: Self-pay | Admitting: Nurse Practitioner

## 2015-05-17 ENCOUNTER — Other Ambulatory Visit: Payer: Self-pay | Admitting: Nurse Practitioner

## 2015-05-17 NOTE — Telephone Encounter (Signed)
Ultram rx ready for pick up  

## 2015-05-17 NOTE — Telephone Encounter (Signed)
Last seen 02/26/15  MMM  If approved print 

## 2015-05-17 NOTE — Telephone Encounter (Signed)
Pt is aware.  

## 2015-05-21 ENCOUNTER — Ambulatory Visit: Payer: BLUE CROSS/BLUE SHIELD | Admitting: Nurse Practitioner

## 2015-05-28 ENCOUNTER — Ambulatory Visit: Payer: BLUE CROSS/BLUE SHIELD | Admitting: Nurse Practitioner

## 2015-06-03 ENCOUNTER — Encounter: Payer: Self-pay | Admitting: Nurse Practitioner

## 2015-06-03 ENCOUNTER — Ambulatory Visit (INDEPENDENT_AMBULATORY_CARE_PROVIDER_SITE_OTHER): Payer: BLUE CROSS/BLUE SHIELD | Admitting: Nurse Practitioner

## 2015-06-03 VITALS — BP 137/70 | HR 87 | Temp 97.1°F | Ht 66.0 in | Wt 225.0 lb

## 2015-06-03 DIAGNOSIS — L989 Disorder of the skin and subcutaneous tissue, unspecified: Secondary | ICD-10-CM | POA: Diagnosis not present

## 2015-06-03 DIAGNOSIS — R42 Dizziness and giddiness: Secondary | ICD-10-CM | POA: Diagnosis not present

## 2015-06-03 MED ORDER — MECLIZINE HCL 25 MG PO TABS: ORAL_TABLET | ORAL | Status: DC

## 2015-06-03 NOTE — Progress Notes (Signed)
   Subjective:    Patient ID: Kimberly Walsh, female    DOB: 1945-07-24, 69 y.o.   MRN: [id number]  HPI Patient here today with c/o: - lesion on her face.She had area froze off by T. Benjamin Stain and she just wanted area looked at again - having vertigo for 2 days- antivert she has is out of date and she needs another rx. SHe denies any nausea- jsut feels dizzy for a few seconds when she stands or changes positions.  Review of Systems  Constitutional: Negative.   HENT: Negative.   Respiratory: Negative.   Cardiovascular: Negative.   Gastrointestinal: Negative.   Genitourinary: Negative.   Neurological: Negative.   Psychiatric/Behavioral: Negative.   All other systems reviewed and are negative.      Objective:   Physical Exam  Constitutional: She is oriented to person, place, and time. She appears well-developed and well-nourished.  Cardiovascular: Normal rate, regular rhythm and normal heart sounds.   Pulmonary/Chest: Effort normal.  Neurological: She is alert and oriented to person, place, and time. She has normal reflexes. No cranial nerve deficit.  Skin: Skin is warm.  1 cm annular macular dry patch on left lower jaw- no erythema or edema  Psychiatric: She has a normal mood and affect. Her behavior is normal. Judgment and thought content normal.    BP 137/70 mmHg  Pulse 87  Temp(Src) 97.1 F (36.2 C) (Oral)  Ht 5\' 6"  (1.676 m)  Wt 225 lb (102.059 kg)  BMI 36.33 kg/m2       Assessment & Plan:  1. Facial lesion Continue  To watch- aea looks good right  Now- n o need for further treatment at this time  2. Vertigo Force fluids RTO prn - meclizine (ANTIVERT) 25 MG tablet; TAKE ONE TAB EVERY 6 HOURS AS NEEDED FOR VERTIGO  Dispense: 30 tablet; Refill: [place], FNP

## 2015-06-03 NOTE — Patient Instructions (Signed)
Vertigo Vertigo means that you feel like you are moving when you are not. Vertigo can also make you feel like things around you are moving when they are not. This feeling can come and go at any time. Vertigo often goes away on its own. HOME CARE  Avoid making fast movements.  Avoid driving.  Avoid using heavy machinery.  Avoid doing any task or activity that might cause danger to you or other people if you would have a vertigo attack while you are doing it.  Sit down right away if you feel dizzy or have trouble with your balance.  Take over-the-counter and prescription medicines only as told by your doctor.  Follow instructions from your doctor about which positions or movements you should avoid.  Drink enough fluid to keep your pee (urine) clear or pale yellow.  Keep all follow-up visits as told by your doctor. This is important. GET HELP IF:  Medicine does not help your vertigo.  You have a fever.  Your problems get worse or you have new symptoms.  Your family or friends see changes in your behavior.  You feel sick to your stomach (nauseous) or you throw up (vomit).  You have a "pins and needles" feeling or you are numb in part of your body. GET HELP RIGHT AWAY IF:  You have trouble moving or talking.  You are always dizzy.  You pass out (faint).  You get very bad headaches.  You feel weak or have trouble using your hands, arms, or legs.  You have changes in your hearing.  You have changes in your seeing (vision).  You get a stiff neck.  Bright light starts to bother you.   This information is not intended to replace advice given to you by your health care provider. Make sure you discuss any questions you have with your health care provider.   Document Released: 03/17/2008 Document Revised: 02/27/2015 Document Reviewed: 10/01/2014 Elsevier Interactive Patient Education 2016 [employer]  

## 2015-06-13 ENCOUNTER — Other Ambulatory Visit: Payer: Self-pay | Admitting: Nurse Practitioner

## 2015-06-13 NOTE — Telephone Encounter (Signed)
Last refill without being seen 

## 2015-06-14 NOTE — Telephone Encounter (Signed)
Left detailed message stating rx sent to pharmacy as requested and would ntbs prior to any further refills and to CB with any further questions or concerns.

## 2015-06-18 ENCOUNTER — Other Ambulatory Visit: Payer: Self-pay | Admitting: Nurse Practitioner

## 2015-06-19 NOTE — Telephone Encounter (Signed)
Last seen 06/03/15 MMM  If approved print

## 2015-06-20 NOTE — Telephone Encounter (Signed)
Tramadol rx ready for pick up

## 2015-07-15 ENCOUNTER — Other Ambulatory Visit: Payer: Self-pay | Admitting: Nurse Practitioner

## 2015-07-16 ENCOUNTER — Other Ambulatory Visit: Payer: Self-pay | Admitting: Nurse Practitioner

## 2015-07-16 NOTE — Telephone Encounter (Signed)
rx ready for pickup 

## 2015-07-16 NOTE — Telephone Encounter (Signed)
Last seen 06/03/15  MMM  If approved print

## 2015-07-16 NOTE — Telephone Encounter (Signed)
Patient aware rx is ready to be picked up 

## 2015-08-05 ENCOUNTER — Encounter: Payer: Self-pay | Admitting: Family Medicine

## 2015-08-05 ENCOUNTER — Ambulatory Visit (INDEPENDENT_AMBULATORY_CARE_PROVIDER_SITE_OTHER): Payer: BLUE CROSS/BLUE SHIELD | Admitting: Family Medicine

## 2015-08-05 ENCOUNTER — Encounter: Payer: Self-pay | Admitting: *Deleted

## 2015-08-05 VITALS — BP 138/78 | HR 94 | Temp 98.7°F | Ht 66.0 in | Wt 224.0 lb

## 2015-08-05 DIAGNOSIS — J09X2 Influenza due to identified novel influenza A virus with other respiratory manifestations: Secondary | ICD-10-CM | POA: Diagnosis not present

## 2015-08-05 DIAGNOSIS — R6889 Other general symptoms and signs: Secondary | ICD-10-CM | POA: Diagnosis not present

## 2015-08-05 LAB — POCT INFLUENZA A/B
INFLUENZA B, POC: NEGATIVE
Influenza A, POC: POSITIVE — AB

## 2015-08-05 MED ORDER — OSELTAMIVIR PHOSPHATE 75 MG PO CAPS: 75.0000 mg | ORAL_CAPSULE | Freq: Two times a day (BID) | ORAL | Status: DC

## 2015-08-05 NOTE — Progress Notes (Signed)
Subjective:    Patient ID: Kimberly Walsh, female    DOB: 12-09-1945, 70 y.o.   MRN: [id number]  HPI Patient here today for flu like symptoms that started yesterday. She was recently exposed to someone diagnosed with influenza A. The patient today complains of congestion and cough headaches and myalgias. A coworker was diagnosed with the flu. This started yesterday.     Patient Active Problem List   Diagnosis Date Noted  . Essential hypertension, benign 03/01/2014  . Hypothyroidism 03/01/2014  . Dyspnea 10/02/2013  . Fatigue 10/02/2013  . Osteoarthritis 04/25/2010  . VERTIGO 04/25/2010   Outpatient Encounter Prescriptions as of 08/05/2015  Medication Sig  . atorvastatin (LIPITOR) 10 MG tablet TAKE ONE TABLET BY MOUTH ONE TIME DAILY  . furosemide (LASIX) 40 MG tablet TAKE ONE TABLET BY MOUTH ONE TIME DAILY  . levothyroxine (SYNTHROID, LEVOTHROID) 25 MCG tablet TAKE ONE TABLET BY MOUTH EVERY DAY AS DIRECTED  . meloxicam (MOBIC) 15 MG tablet TAKE ONE TABLET BY MOUTH ONE TIME DAILY  . traMADol (ULTRAM) 50 MG tablet TAKE ONE TABLET BY MOUTH DAILY AS NEEDED  . valsartan (DIOVAN) 160 MG tablet TAKE ONE TABLET BY MOUTH ONE TIME DAILY  . meclizine (ANTIVERT) 25 MG tablet TAKE ONE TAB EVERY 6 HOURS AS NEEDED FOR VERTIGO (Patient not taking: Reported on 08/05/2015)   No facility-administered encounter medications on file as of 08/05/2015.      Review of Systems  Constitutional: Negative.  Fever: unknown.  HENT: Positive for congestion.   Eyes: Negative.   Respiratory: Positive for cough.   Cardiovascular: Negative.   Gastrointestinal: Negative.   Endocrine: Negative.   Genitourinary: Negative.   Musculoskeletal: Positive for myalgias.  Skin: Negative.   Allergic/Immunologic: Negative.   Neurological: Positive for headaches.  Hematological: Negative.   Psychiatric/Behavioral: Negative.        Objective:   Physical Exam  Constitutional: She is oriented to person, place, and  time. She appears well-developed and well-nourished. No distress.  HENT:  Head: Normocephalic and atraumatic.  Right Ear: External ear normal.  Left Ear: External ear normal.  Nose: Nose normal.  Mouth/Throat: Oropharynx is clear and moist. No oropharyngeal exudate.  Eyes: Conjunctivae and EOM are normal. Pupils are equal, round, and reactive to light. Right eye exhibits no discharge. Left eye exhibits no discharge. No scleral icterus.  Neck: Normal range of motion. Neck supple. No thyromegaly present.  Cardiovascular: Normal rate, regular rhythm and normal heart sounds.   Pulmonary/Chest: Effort normal and breath sounds normal. No respiratory distress. She has no wheezes. She has no rales.  Dry irritated cough  Musculoskeletal: Normal range of motion. She exhibits no edema.  Neurological: She is alert and oriented to person, place, and time.  Skin: Skin is warm and dry. No rash noted.  Psychiatric: She has a normal mood and affect. Her behavior is normal. Thought content normal.  Nursing note and vitals reviewed.    BP 138/78 mmHg  Pulse 94  Temp(Src) 98.7 F (37.1 C) (Oral)  Ht 5\' 6"  (1.676 m)  Wt 224 lb (101.606 kg)  BMI 36.17 kg/m2  Results for orders placed or performed in visit on 08/05/15  POCT Influenza A/B  Result Value Ref Range   Influenza A, POC Positive (A) Negative   Influenza B, POC Negative Negative       Assessment & Plan:  1. Flu-like symptoms -Take Tylenol for aches pains and fever and drink plenty of fluids and use nasal saline and nasal  saline gel and Mucinex for symptoms of cough and head congestion - POCT Influenza A/B  2. Influenza due to identified novel influenza A virus with other respiratory manifestations -Take Tamiflu as directed -Remain out of work  Meds ordered this encounter  Medications  . oseltamivir (TAMIFLU) 75 MG capsule    Sig: Take 1 capsule (75 mg total) by mouth 2 (two) times daily.    Dispense:  10 capsule    Refill:  0    Patient Instructions  Continue to drink plenty of fluids and alternate Tylenol and ibuprofen as needed for fever control Take Mucinex regularly twice daily with a large glass of water Take antiviral medicine as directed until completed Remain out of work for the remainder of the week Use nasal saline frequently in each nostril   Arrie Senate MD

## 2015-08-05 NOTE — Patient Instructions (Signed)
Continue to drink plenty of fluids and alternate Tylenol and ibuprofen as needed for fever control Take Mucinex regularly twice daily with a large glass of water Take antiviral medicine as directed until completed Remain out of work for the remainder of the week Use nasal saline frequently in each nostril

## 2015-08-09 ENCOUNTER — Other Ambulatory Visit: Payer: Self-pay | Admitting: Family Medicine

## 2015-08-11 ENCOUNTER — Other Ambulatory Visit: Payer: Self-pay | Admitting: Family Medicine

## 2015-08-12 ENCOUNTER — Other Ambulatory Visit: Payer: Self-pay | Admitting: Family Medicine

## 2015-08-12 NOTE — Telephone Encounter (Signed)
Would this help at this point?

## 2015-08-14 NOTE — Telephone Encounter (Signed)
last seen 08/05/15  DWM

## 2015-08-28 ENCOUNTER — Other Ambulatory Visit: Payer: Self-pay | Admitting: Nurse Practitioner

## 2015-08-29 NOTE — Telephone Encounter (Signed)
Last filled 07/20/15, last seen 06/03/15. Call in at [facility]

## 2015-08-29 NOTE — Telephone Encounter (Signed)
rx ready for pickup 

## 2015-08-29 NOTE — Telephone Encounter (Signed)
rx called in

## 2015-09-20 ENCOUNTER — Encounter: Payer: BLUE CROSS/BLUE SHIELD | Admitting: *Deleted

## 2015-09-24 ENCOUNTER — Telehealth: Payer: Self-pay | Admitting: Family Medicine

## 2015-09-24 NOTE — Telephone Encounter (Signed)
Patient called stating that her Husband Kolbee Quill was seen at [facility] for SOB and needs a not for her to stay out of work with him until 04/14

## 2015-09-24 NOTE — Telephone Encounter (Signed)
Please give patient a note so that she can be with her husband.

## 2015-09-25 ENCOUNTER — Encounter: Payer: Self-pay | Admitting: Family Medicine

## 2015-09-25 NOTE — Telephone Encounter (Signed)
Letter typed and waiting for patient pickup

## 2015-09-25 NOTE — Telephone Encounter (Signed)
Note will be given to patient today.

## 2015-10-01 ENCOUNTER — Other Ambulatory Visit: Payer: Self-pay | Admitting: *Deleted

## 2015-10-01 MED ORDER — TRAMADOL HCL 50 MG PO TABS: 50.0000 mg | ORAL_TABLET | Freq: Every day | ORAL | Status: DC | PRN

## 2015-10-01 NOTE — Telephone Encounter (Signed)
Pt needs refill - sees MMM for chronic follow ups

## 2015-10-21 ENCOUNTER — Other Ambulatory Visit: Payer: Self-pay | Admitting: Nurse Practitioner

## 2015-11-19 ENCOUNTER — Encounter: Payer: BLUE CROSS/BLUE SHIELD | Admitting: *Deleted

## 2015-11-26 ENCOUNTER — Other Ambulatory Visit: Payer: Self-pay | Admitting: Nurse Practitioner

## 2015-11-26 MED ORDER — MELOXICAM 15 MG PO TABS: 15.0000 mg | ORAL_TABLET | Freq: Every day | ORAL | Status: DC

## 2015-11-26 NOTE — Telephone Encounter (Signed)
Refill called to [facility] 

## 2015-11-26 NOTE — Telephone Encounter (Signed)
Tramadol rx ready for pick up

## 2015-11-27 ENCOUNTER — Other Ambulatory Visit: Payer: Self-pay | Admitting: Nurse Practitioner

## 2015-11-30 ENCOUNTER — Other Ambulatory Visit: Payer: Self-pay | Admitting: Nurse Practitioner

## 2015-12-13 ENCOUNTER — Other Ambulatory Visit: Payer: Self-pay | Admitting: Nurse Practitioner

## 2015-12-13 NOTE — Telephone Encounter (Signed)
Last seen 06/03/15  MMM

## 2016-01-20 ENCOUNTER — Encounter: Payer: Self-pay | Admitting: Family Medicine

## 2016-02-12 ENCOUNTER — Other Ambulatory Visit: Payer: Self-pay | Admitting: Nurse Practitioner

## 2016-02-13 NOTE — Telephone Encounter (Signed)
ntbs for tramadol

## 2016-02-13 NOTE — Telephone Encounter (Signed)
Last filled 12/06/15, last seen 05/2015. Phone in if approved.

## 2016-02-13 NOTE — Telephone Encounter (Signed)
Pt aware NTBS & appt set for this coming Monday

## 2016-02-17 ENCOUNTER — Ambulatory Visit (INDEPENDENT_AMBULATORY_CARE_PROVIDER_SITE_OTHER): Payer: Medicare Other | Admitting: Nurse Practitioner

## 2016-02-17 ENCOUNTER — Encounter: Payer: Self-pay | Admitting: Nurse Practitioner

## 2016-02-17 VITALS — BP 152/74 | HR 73 | Temp 96.9°F | Ht 66.0 in | Wt 231.0 lb

## 2016-02-17 DIAGNOSIS — R609 Edema, unspecified: Secondary | ICD-10-CM

## 2016-02-17 DIAGNOSIS — R42 Dizziness and giddiness: Secondary | ICD-10-CM

## 2016-02-17 DIAGNOSIS — E034 Atrophy of thyroid (acquired): Secondary | ICD-10-CM | POA: Diagnosis not present

## 2016-02-17 DIAGNOSIS — M47816 Spondylosis without myelopathy or radiculopathy, lumbar region: Secondary | ICD-10-CM | POA: Diagnosis not present

## 2016-02-17 DIAGNOSIS — E038 Other specified hypothyroidism: Secondary | ICD-10-CM | POA: Diagnosis not present

## 2016-02-17 DIAGNOSIS — I10 Essential (primary) hypertension: Secondary | ICD-10-CM | POA: Diagnosis not present

## 2016-02-17 DIAGNOSIS — Z1159 Encounter for screening for other viral diseases: Secondary | ICD-10-CM

## 2016-02-17 DIAGNOSIS — E785 Hyperlipidemia, unspecified: Secondary | ICD-10-CM | POA: Diagnosis not present

## 2016-02-17 MED ORDER — MELOXICAM 15 MG PO TABS: 15.0000 mg | ORAL_TABLET | Freq: Every day | ORAL | 5 refills | Status: DC

## 2016-02-17 MED ORDER — ATORVASTATIN CALCIUM 10 MG PO TABS: 10.0000 mg | ORAL_TABLET | Freq: Every day | ORAL | 5 refills | Status: DC

## 2016-02-17 MED ORDER — VALSARTAN 160 MG PO TABS: 160.0000 mg | ORAL_TABLET | Freq: Every day | ORAL | 5 refills | Status: DC

## 2016-02-17 MED ORDER — TRAMADOL HCL 50 MG PO TABS: 50.0000 mg | ORAL_TABLET | Freq: Every day | ORAL | 1 refills | Status: DC | PRN

## 2016-02-17 MED ORDER — LEVOTHYROXINE SODIUM 25 MCG PO TABS: 25.0000 ug | ORAL_TABLET | Freq: Every day | ORAL | 6 refills | Status: DC

## 2016-02-17 MED ORDER — FUROSEMIDE 40 MG PO TABS: 40.0000 mg | ORAL_TABLET | Freq: Every day | ORAL | 5 refills | Status: DC

## 2016-02-17 NOTE — Patient Instructions (Signed)
Estasis venosa o insuficiencia venosa crnica (Venous Stasis or Chronic Venous Insufficiency) La insuficiencia venosa crnica, tambin llamada estasis venosa, es una enfermedad que afecta las venas de las piernas. Esta afeccin evita el bombeo eficaz de la sangre a travs de las venas. La sangre ya no se bombea correctamente de las piernas al corazn. La enfermedad puede ser de leve a grave. Con un tratamiento adecuado, podr llevar una vida activa. CAUSAS  La insuficiencia venosa crnica ocurre cuando las paredes de las venas se estiran, debilitan o daan, o cuando las vlvulas de las venas estn daadas. Algunas causas comunes incluyen:  Hipertensin arterial en las venas (hipertensin venosa).  Aumento de la tensin arterial en las venas de las piernas por pasar largos perodos sentado o de pie.  Un cogulo sanguneo que bloquea la circulacin en una vena (trombosis venosa profunda).  Una inflamacin de una vena superficial (flebitis) que forma un cogulo sanguneo . FACTORES DE RIESGO Hay diversos factores que aumentan las probabilidades de desarrollar insuficiencia venosa crnica, por ejemplo:  Antecedentes familiares de la enfermedad.  Obesidad.  Embarazo.  Estilo de vida sedentario.  Fumar.  Trabajos que requieren permanecer largos perodos de pie o sentado en un lugar.  Tener cierta edad. Las mujeres entre 40 y 50aos y los hombres de ms de 70 aos tienen una probabilidad mayor de desarrollar esta enfermedad. SIGNOS Y SNTOMAS  Los sntomas pueden ser:   Venas varicosas  Laceracin o lceras en la piel.  Enrojecimiento o cambio de color en la piel de la pierna.  Piel amarronada, suave y tirante, y con dolor por arriba del tobillo, generalmente sobre la superficie interna (lipodermatosclerosis).  Hinchazn. DIAGNSTICO  Para diagnosticar la enfermedad, el mdico le har una historia clnica y un examen fsico. Para confirmar el diagnstico, le indicarn las  siguientes pruebas:  Ecografa dplex: procedimiento que produce una imagen de los vasos sanguneos y los rganos cercanos, y adems proporciona informacin sobre el flujo sanguneo a travs de los vasos.  Pletismografa: procedimiento que estudia el flujo sanguneo.  Venograma o venografa: procedimiento utilizado para observar las venas mediante una radiografa y una sustancia de contraste. TRATAMIENTO Los objetivos del tratamiento es que la persona vuelva a tener una vida activa y minimizar el dolor o la discapacidad. El tratamiento depender de la gravedad de la enfermedad. Los procedimientos mdicos pueden necesitarse para casos graves. Las opciones de tratamiento son:   Uso de medias de compresin. Ayudan a aliviar los sntomas y a disminuir las probabilidades de que el problema empeore, pero no lo curan.  Escleroterapia, un procedimiento que implica la aplicacin de una inyeccin con una sustancia que "disuelve" las venas daadas. Otras venas toman la funcin de las venas daadas.  Ciruga para extraer la vena o cortar el flujo sanguneo a travs de la vena (extirpacin de la vena o ciruga de ablacin con lser).  Ciruga para reparar una vlvula. INSTRUCCIONES PARA EL CUIDADO EN EL HOGAR   Use medias de compresin como le haya indicado su mdico.  Utilice los medicamentos de venta libre o recetados para calmar el dolor, el malestar o la fiebre, segn se lo indique el mdico.  Concurra a las consultas de control con su mdico segn las indicaciones. SOLICITE ATENCIN MDICA SI:   Tiene enrojecimiento, hinchazn o aumento del dolor en la zona afectada.  Observa una lnea roja que se extiende por arriba o por debajo de la zona afectada.  Tiene una laceracin o prdida de la piel en la zona   afectada, aunque sea pequea.  Se lesiona la zona afectada. SOLICITE ATENCIN MDICA DE INMEDIATO SI:   Tiene una lesin y una herida abierta en la zona afectada.  El dolor es intenso y  no mejora con los medicamentos.  Tiene adormecimiento o debilidad de repente en el pie o el tobillo por debajo de la zona afectada, o tiene dificultad para mover el pie o el tobillo.  Tiene fiebre o sntomas persistentes durante ms de 2a 3das.  Tiene fiebre y los sntomas empeoran repentinamente. ASEGRESE DE QUE:   Comprende estas instrucciones.  Controlar su afeccin.  Recibir ayuda de inmediato si no mejora o si empeora.   Esta informacin no tiene como fin reemplazar el consejo del mdico. Asegrese de hacerle al mdico cualquier pregunta que tenga.   Document Released: 09/24/2008 Document Revised: 03/29/2013 Elsevier Interactive Patient Education 2016 [employer] Inc.  

## 2016-02-17 NOTE — Progress Notes (Signed)
Subjective:    Patient ID: Kimberly Walsh, female    DOB: [birth year], 70 y.o.   MRN: [id number]   Patient here today for follow up of chronic medical problems.  Outpatient Encounter Prescriptions as of 02/17/2016  Medication Sig  . atorvastatin (LIPITOR) 10 MG tablet TAKE ONE TABLET BY MOUTH ONE TIME DAILY  . furosemide (LASIX) 40 MG tablet TAKE ONE TABLET BY MOUTH ONE TIME DAILY  . levothyroxine (SYNTHROID, LEVOTHROID) 25 MCG tablet TAKE ONE TABLET BY MOUTH EVERY DAY AS DIRECTED  . meclizine (ANTIVERT) 25 MG tablet TAKE ONE TAB EVERY 6 HOURS AS NEEDED FOR VERTIGO (Patient not taking: Reported on 08/05/2015)  . meloxicam (MOBIC) 15 MG tablet TAKE ONE TABLET BY MOUTH ONE TIME DAILY  . oseltamivir (TAMIFLU) 75 MG capsule TAKE ONE CAPSULE BY MOUTH TWICE DAILY  . traMADol (ULTRAM) 50 MG tablet TAKE ONE TABLET BY MOUTH ONCE DAILY AS NEEDED  . valsartan (DIOVAN) 160 MG tablet TAKE ONE TABLET BY MOUTH ONE TIME DAILY   No facility-administered encounter medications on file as of 02/17/2016.      Hypertension  This is a chronic problem. The current episode started more than 1 year ago. The problem is controlled. Risk factors for coronary artery disease include post-menopausal state. Past treatments include beta blockers. The current treatment provides moderate improvement. Compliance problems include diet and exercise.  Hypertensive end-organ damage includes a thyroid problem.  Thyroid Problem  Visit type: hypothyroidism. The symptoms have been stable. The treatment provided significant relief.   DJD Ultram and mobic- works well to keep pain to a minimum. She has been out of insurance and has not had in awhile. Peripheral edema Lasix daily- swelling still occurring but only on right side- has had varicose vein surgery on that leg 2x- wold like to have a unna boot.      Review of Systems  Constitutional: Negative.   HENT: Negative.   Respiratory: Negative.   Cardiovascular: Negative.     Genitourinary: Negative.   Neurological: Negative.   Psychiatric/Behavioral: Negative.   All other systems reviewed and are negative.      Objective:   Physical Exam  Constitutional: She is oriented to person, place, and time. She appears well-developed and well-nourished.  HENT:  Nose: Nose normal.  Mouth/Throat: Oropharynx is clear and moist.  Eyes: EOM are normal.  Neck: Trachea normal, normal range of motion and full passive range of motion without pain. Neck supple. No JVD present. Carotid bruit is not present. No thyromegaly present.  Cardiovascular: Normal rate, regular rhythm, normal heart sounds and intact distal pulses.  Exam reveals no gallop and no friction rub.   No murmur heard. Pulmonary/Chest: Effort normal and breath sounds normal.  Abdominal: Soft. Bowel sounds are normal. She exhibits no distension and no mass. There is no tenderness.  Musculoskeletal: Normal range of motion. She exhibits edema (BIL lower leg +2. ).  Decrease ROM of lumbar spine due to pain on flexion (-) SLR bil  Lymphadenopathy:    She has no cervical adenopathy.  Neurological: She is alert and oriented to person, place, and time. She has normal reflexes. No cranial nerve deficit.  Skin: Skin is warm and dry.  Lichenification with brownish discoloration of right lower leg.  Psychiatric: She has a normal mood and affect. Her behavior is normal. Judgment and thought content normal.   BP (!) 152/74 (BP Location: Left Arm, Cuff Size: Large)   Pulse 73   Temp (!) 96.9 F (36.1  C) (Oral)   Ht 5' 6"  (1.676 m)   Wt 231 lb (104.8 kg)   BMI 37.28 kg/m          Assessment & Plan:   1. Essential hypertension, benign Do not add salt to diet - CMP14+EGFR - valsartan (DIOVAN) 160 MG tablet; Take 1 tablet (160 mg total) by mouth daily.  Dispense: 30 tablet; Refill: 5  2. Hypothyroidism due to acquired atrophy of thyroid - levothyroxine (SYNTHROID, LEVOTHROID) 25 MCG tablet; Take 1 tablet (25  mcg total) by mouth daily. as directed  Dispense: 30 tablet; Refill: 6  3. Lumbar spondylosis, unspecified spinal osteoarthritis - traMADol (ULTRAM) 50 MG tablet; Take 1 tablet (50 mg total) by mouth daily as needed.  Dispense: 30 tablet; Refill: 1 - meloxicam (MOBIC) 15 MG tablet; Take 1 tablet (15 mg total) by mouth daily.  Dispense: 30 tablet; Refill: 5  4. VERTIGO  5. Hyperlipidemia Low fat diet - Lipid panel - atorvastatin (LIPITOR) 10 MG tablet; Take 1 tablet (10 mg total) by mouth daily.  Dispense: 30 tablet; Refill: 5  6. Peripheral edema elevate foot when sitting Unna boot care - furosemide (LASIX) 40 MG tablet; Take 1 tablet (40 mg total) by mouth daily.  Dispense: 30 tablet; Refill: 5 - Unna boot RTO thursday for removal   Patient will schedule appointment for pap in 3 months as well as mammogram Labs pending Health maintenance reviewed Diet and exercise encouraged Continue all meds Follow up  In 64month   M[place]acortes FNP

## 2016-02-18 ENCOUNTER — Other Ambulatory Visit: Payer: Self-pay | Admitting: Nurse Practitioner

## 2016-02-18 LAB — LIPID PANEL
CHOL/HDL RATIO: 5.2 ratio — AB (ref 0.0–4.4)
Cholesterol, Total: 199 mg/dL (ref 100–199)
HDL: 38 mg/dL — AB (ref >39)
LDL CALC: 129 mg/dL — AB (ref 0–99)
TRIGLYCERIDES: 159 mg/dL — AB (ref 0–149)
VLDL CHOLESTEROL CAL: 32 mg/dL (ref 5–40)

## 2016-02-18 LAB — CMP14+EGFR
A/G RATIO: 1.3 (ref 1.2–2.2)
ALT: 20 IU/L (ref 0–32)
AST: 24 IU/L (ref 0–40)
Albumin: 4.3 g/dL (ref 3.6–4.8)
Alkaline Phosphatase: 113 IU/L (ref 39–117)
BUN/Creatinine Ratio: 14 (ref 12–28)
BUN: 15 mg/dL (ref 8–27)
Bilirubin Total: 0.4 mg/dL (ref 0.0–1.2)
CALCIUM: 9.4 mg/dL (ref 8.7–10.3)
CO2: 19 mmol/L (ref 18–29)
CREATININE: 1.04 mg/dL — AB (ref 0.57–1.00)
Chloride: 105 mmol/L (ref 96–106)
GFR, EST AFRICAN AMERICAN: 64 mL/min/{1.73_m2} (ref >59)
GFR, EST NON AFRICAN AMERICAN: 55 mL/min/{1.73_m2} — AB (ref >59)
GLUCOSE: 85 mg/dL (ref 65–99)
Globulin, Total: 3.4 g/dL (ref 1.5–4.5)
Potassium: 4.6 mmol/L (ref 3.5–5.2)
Sodium: 144 mmol/L (ref 134–144)
TOTAL PROTEIN: 7.7 g/dL (ref 6.0–8.5)

## 2016-02-18 LAB — HEPATITIS C ANTIBODY

## 2016-02-18 MED ORDER — ATORVASTATIN CALCIUM 40 MG PO TABS: 40.0000 mg | ORAL_TABLET | Freq: Every day | ORAL | 5 refills | Status: DC

## 2016-02-20 ENCOUNTER — Encounter: Payer: Self-pay | Admitting: Nurse Practitioner

## 2016-02-20 ENCOUNTER — Ambulatory Visit (INDEPENDENT_AMBULATORY_CARE_PROVIDER_SITE_OTHER): Payer: Medicare Other | Admitting: Nurse Practitioner

## 2016-02-20 VITALS — BP 139/73 | HR 77 | Temp 96.7°F | Ht 66.0 in | Wt 230.0 lb

## 2016-02-20 DIAGNOSIS — R609 Edema, unspecified: Secondary | ICD-10-CM | POA: Diagnosis not present

## 2016-02-20 DIAGNOSIS — Z024 Encounter for examination for driving license: Secondary | ICD-10-CM | POA: Diagnosis not present

## 2016-02-20 LAB — URINALYSIS
Bilirubin, UA: NEGATIVE
Glucose, UA: NEGATIVE
KETONES UA: NEGATIVE
NITRITE UA: NEGATIVE
Protein, UA: NEGATIVE
RBC UA: NEGATIVE
SPEC GRAV UA: 1.02 (ref 1.005–1.030)
UUROB: 0.2 mg/dL (ref 0.2–1.0)
pH, UA: 6 (ref 5.0–7.5)

## 2016-02-20 NOTE — Progress Notes (Signed)
   Subjective:    Patient ID: Kimberly Walsh, female    DOB: 1945/09/20, 70 y.o.   MRN: [id number]  HPI Patient comes in today for follow up of venous stasis of right lower leg- We put her on lasix and wrapped leg in unna boot. Seems to be doing better. Weight down 1 lb.  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   HENT: Negative.   Respiratory: Negative.   Cardiovascular: Negative.   Gastrointestinal: Negative.   Genitourinary: Negative.   Neurological: Negative.   Psychiatric/Behavioral: Negative.   All other systems reviewed and are negative.      Objective:   Physical Exam  Constitutional: She is oriented to person, place, and time. She appears well-developed and well-nourished. No distress.  HENT:  Nose: Nose normal.  Mouth/Throat: Oropharynx is clear and moist.  Eyes: EOM are normal.  Neck: Trachea normal, normal range of motion and full passive range of motion without pain. Neck supple. No JVD present. Carotid bruit is not present. No thyromegaly present.  Cardiovascular: Normal rate, regular rhythm, normal heart sounds and intact distal pulses.  Exam reveals no gallop and no friction rub.   No murmur heard. Pulmonary/Chest: Effort normal and breath sounds normal.  Abdominal: Soft. Bowel sounds are normal. She exhibits no distension and no mass. There is no tenderness.  Musculoskeletal: Normal range of motion. She exhibits edema (!+ edema right lower leg).  Lymphadenopathy:    She has no cervical adenopathy.  Neurological: She is alert and oriented to person, place, and time. She has normal reflexes.  Skin: Skin is warm and dry.  Circulatory discoloration to anterior right lower leg  Psychiatric: She has a normal mood and affect. Her behavior is normal. Judgment and thought content normal.      BP 139/73   Pulse 77   Temp (!) 96.7 F (35.9 C) (Oral)   Ht 5\' 6"  (1.676 m)   Wt 230 lb (104.3 kg)    BMI 37.12 kg/m      Assessment & Plan:   1. Encounter for examination for driving license   2. Peripheral edema    .ocntinue all meds No labs today Get some compression socks to wear during th eday RTO prn  Mary-Margaret Hassell Done, FNP

## 2016-04-13 ENCOUNTER — Other Ambulatory Visit: Payer: Self-pay | Admitting: Nurse Practitioner

## 2016-04-15 ENCOUNTER — Other Ambulatory Visit: Payer: Self-pay | Admitting: Nurse Practitioner

## 2016-04-16 NOTE — Telephone Encounter (Signed)
Detailed message left for patient that she will need to schedule an appointment to be seen.

## 2016-04-16 NOTE — Telephone Encounter (Signed)
Cannot get pain medication without appointment- if she takes on regular basisi then will need pain appointment to sign contract.

## 2016-04-17 ENCOUNTER — Telehealth: Payer: Self-pay | Admitting: Nurse Practitioner

## 2016-04-17 NOTE — Telephone Encounter (Signed)
Left message for patient , please call to schedule an appointment for doing a pain contract.  Can no longer give refills with out having this done. Sorry for any inconvenience.

## 2016-04-17 NOTE — Telephone Encounter (Signed)
Please call patient and let her know that if she is going to continue to take ultram- need to make appointment for pain management in order to get filled.

## 2016-05-05 ENCOUNTER — Encounter: Payer: Self-pay | Admitting: Nurse Practitioner

## 2016-05-05 ENCOUNTER — Ambulatory Visit (INDEPENDENT_AMBULATORY_CARE_PROVIDER_SITE_OTHER): Payer: Medicare Other | Admitting: Nurse Practitioner

## 2016-05-05 VITALS — BP 135/73 | HR 87 | Temp 97.0°F | Ht 66.0 in | Wt 232.0 lb

## 2016-05-05 DIAGNOSIS — E034 Atrophy of thyroid (acquired): Secondary | ICD-10-CM

## 2016-05-05 DIAGNOSIS — I10 Essential (primary) hypertension: Secondary | ICD-10-CM

## 2016-05-05 DIAGNOSIS — Z23 Encounter for immunization: Secondary | ICD-10-CM | POA: Diagnosis not present

## 2016-05-05 DIAGNOSIS — R609 Edema, unspecified: Secondary | ICD-10-CM | POA: Diagnosis not present

## 2016-05-05 DIAGNOSIS — R42 Dizziness and giddiness: Secondary | ICD-10-CM

## 2016-05-05 DIAGNOSIS — M47816 Spondylosis without myelopathy or radiculopathy, lumbar region: Secondary | ICD-10-CM

## 2016-05-05 DIAGNOSIS — E782 Mixed hyperlipidemia: Secondary | ICD-10-CM

## 2016-05-05 MED ORDER — MECLIZINE HCL 25 MG PO TABS: ORAL_TABLET | ORAL | 1 refills | Status: DC

## 2016-05-05 MED ORDER — MEDICAL COMPRESSION STOCKINGS MISC
0 refills | Status: DC
Start: 2016-05-05 — End: 2016-05-26

## 2016-05-05 MED ORDER — TRAMADOL HCL 50 MG PO TABS: 50.0000 mg | ORAL_TABLET | Freq: Every day | ORAL | 0 refills | Status: DC | PRN

## 2016-05-05 MED ORDER — SCOPOLAMINE 1 MG/3DAYS TD PT72: 1.0000 | MEDICATED_PATCH | TRANSDERMAL | 12 refills | Status: DC

## 2016-05-05 NOTE — Progress Notes (Signed)
Subjective:    Patient ID: Kimberly Walsh, female    DOB: [birth year], 70 y.o.   MRN: [id number]  HPI  Patient comes in today for follow up on her chronic disease and venous stasis of her right lower leg.   Current Outpatient Prescriptions on File Prior to Visit  Medication Sig Dispense Refill  . atorvastatin (LIPITOR) 40 MG tablet Take 1 tablet (40 mg total) by mouth daily. 30 tablet 5  . furosemide (LASIX) 40 MG tablet Take 1 tablet (40 mg total) by mouth daily. 30 tablet 5  . levothyroxine (SYNTHROID, LEVOTHROID) 25 MCG tablet Take 1 tablet (25 mcg total) by mouth daily. as directed 30 tablet 6  . meclizine (ANTIVERT) 25 MG tablet TAKE ONE TAB EVERY 6 HOURS AS NEEDED FOR VERTIGO 30 tablet 1  . meloxicam (MOBIC) 15 MG tablet Take 1 tablet (15 mg total) by mouth daily. 30 tablet 5  . valsartan (DIOVAN) 160 MG tablet Take 1 tablet (160 mg total) by mouth daily. 30 tablet 5  . traMADol (ULTRAM) 50 MG tablet Take 1 tablet (50 mg total) by mouth daily as needed. (Patient not taking: Reported on 05/05/2016) 30 tablet 1   No current facility-administered medications on file prior to visit.    * she wants a refill on Meclizine- she had episodes of vertigo during a fishing trip 3 weeks ago and because she going on a cruise in 2 months. She is asking for scopolamine patch for sea sickness  Review of Systems  Constitutional: Negative.   HENT: Negative.   Respiratory: Negative.   Cardiovascular: Positive for leg swelling (right leg).  Gastrointestinal: Negative.   Genitourinary: Negative.   Musculoskeletal: Positive for arthralgias (pain in fingers and knees).  Neurological: Negative.   Psychiatric/Behavioral: Negative.   All other systems reviewed and are negative.      Objective:   Physical Exam  Constitutional: She is oriented to person, place, and time. She appears well-developed and well-nourished. No distress.  HENT:  Nose: Nose normal.  Mouth/Throat: Oropharynx is clear and  moist.  Eyes: EOM are normal.  Neck: Trachea normal, normal range of motion and full passive range of motion without pain. Neck supple. No JVD present. Carotid bruit is not present. No thyromegaly present.  Cardiovascular: Normal rate, regular rhythm, normal heart sounds and intact distal pulses.  Exam reveals no gallop and no friction rub.   No murmur heard. Pulmonary/Chest: Effort normal and breath sounds normal.  Abdominal: Soft. Bowel sounds are normal. She exhibits no distension and no mass. There is no tenderness.  Musculoskeletal: She exhibits edema (2+ edema right lower leg) and deformity (nodes on inter-phalangeal joints of bilateral fingersj, no erythmia).  Lymphadenopathy:    She has no cervical adenopathy.  Neurological: She is alert and oriented to person, place, and time. She has normal reflexes.  Skin: Skin is warm and dry.  Circulatory discoloration to anterior right lower leg  Psychiatric: She has a normal mood and affect. Her behavior is normal. Judgment and thought content normal.    BP 135/73   Pulse 87   Temp 97 F (36.1 C) (Oral)   Ht _0  (1.676 m)   Wt 232 lb (105.2 kg)   BMI 37.45 kg/m      Assessment & Plan:   1. Essential hypertension, benign Low salt diet - CMP14+EGFR  2. Hypothyroidism due to acquired atrophy of thyroid - TSH  3. Osteoarthritis of lumbar spine, unspecified spinal osteoarthritis complication status - tramadol  refilled Patient needs pain management appointment in order to get any more rx  4. VERTIGO   5. Mixed hyperlipidemia Low fat diet - Lipid panel    Labs pending Health maintenance reviewed Diet and exercise encouraged Continue all meds Follow up  in 3 months  Jari Favre, RN,BSN, AGNP Student  Mary-Margaret Hassell Done, FNP

## 2016-05-05 NOTE — Patient Instructions (Signed)
Benign Positional Vertigo Introduction Vertigo is the feeling that you or your surroundings are moving when they are not. Benign positional vertigo is the most common form of vertigo. The cause of this condition is not serious (is benign). This condition is triggered by certain movements and positions (is positional). This condition can be dangerous if it occurs while you are doing something that could endanger you or others, such as driving. What are the causes? In many cases, the cause of this condition is not known. It may be caused by a disturbance in an area of the inner ear that helps your brain to sense movement and balance. This disturbance can be caused by a viral infection (labyrinthitis), head injury, or repetitive motion. What increases the risk? This condition is more likely to develop in:  Women.  People who are 50 years of age or older. What are the signs or symptoms? Symptoms of this condition usually happen when you move your head or your eyes in different directions. Symptoms may start suddenly, and they usually last for less than a minute. Symptoms may include:  Loss of balance and falling.  Feeling like you are spinning or moving.  Feeling like your surroundings are spinning or moving.  Nausea and vomiting.  Blurred vision.  Dizziness.  Involuntary eye movement (nystagmus). Symptoms can be mild and cause only slight annoyance, or they can be severe and interfere with daily life. Episodes of benign positional vertigo may return (recur) over time, and they may be triggered by certain movements. Symptoms may improve over time. How is this diagnosed? This condition is usually diagnosed by medical history and a physical exam of the head, neck, and ears. You may be referred to a health care provider who specializes in ear, nose, and throat (ENT) problems (otolaryngologist) or a provider who specializes in disorders of the nervous system (neurologist). You may have  additional testing, including:  MRI.  A CT scan.  Eye movement tests. Your health care provider may ask you to change positions quickly while he or she watches you for symptoms of benign positional vertigo, such as nystagmus. Eye movement may be tested with an electronystagmogram (ENG), caloric stimulation, the Dix-Hallpike test, or the roll test.  An electroencephalogram (EEG). This records electrical activity in your brain.  Hearing tests. How is this treated? Usually, your health care provider will treat this by moving your head in specific positions to adjust your inner ear back to normal. Surgery may be needed in severe cases, but this is rare. In some cases, benign positional vertigo may resolve on its own in 2-4 weeks. Follow these instructions at home: Safety  Move slowly.Avoid sudden body or head movements.  Avoid driving.  Avoid operating heavy machinery.  Avoid doing any tasks that would be dangerous to you or others if a vertigo episode would occur.  If you have trouble walking or keeping your balance, try using a cane for stability. If you feel dizzy or unstable, sit down right away.  Return to your normal activities as told by your health care provider. Ask your health care provider what activities are safe for you. General instructions  Take over-the-counter and prescription medicines only as told by your health care provider.  Avoid certain positions or movements as told by your health care provider.  Drink enough fluid to keep your urine clear or pale yellow.  Keep all follow-up visits as told by your health care provider. This is important. Contact a health care provider if:    You have a fever.  Your condition gets worse or you develop new symptoms.  Your family or friends notice any behavioral changes.  Your nausea or vomiting gets worse.  You have numbness or a "pins and needles" sensation. Get help right away if:  You have difficulty speaking or  moving.  You are always dizzy.  You faint.  You develop severe headaches.  You have weakness in your legs or arms.  You have changes in your hearing or vision.  You develop a stiff neck.  You develop sensitivity to light. This information is not intended to replace advice given to you by your health care provider. Make sure you discuss any questions you have with your health care provider. Document Released: 03/16/2006 Document Revised: 11/14/2015 Document Reviewed: 10/01/2014  2017 Elsevier  

## 2016-05-11 ENCOUNTER — Telehealth: Payer: Self-pay | Admitting: Nurse Practitioner

## 2016-05-11 NOTE — Telephone Encounter (Signed)
Patient did not have labs drawn at last visit as ordered

## 2016-05-11 NOTE — Telephone Encounter (Signed)
Message left , please return to office to have lab work done.

## 2016-05-12 ENCOUNTER — Other Ambulatory Visit: Payer: Medicare Other

## 2016-05-12 DIAGNOSIS — E034 Atrophy of thyroid (acquired): Secondary | ICD-10-CM | POA: Diagnosis not present

## 2016-05-12 DIAGNOSIS — I10 Essential (primary) hypertension: Secondary | ICD-10-CM | POA: Diagnosis not present

## 2016-05-12 DIAGNOSIS — E782 Mixed hyperlipidemia: Secondary | ICD-10-CM | POA: Diagnosis not present

## 2016-05-13 LAB — CMP14+EGFR
A/G RATIO: 1.3 (ref 1.2–2.2)
ALK PHOS: 114 IU/L (ref 39–117)
ALT: 23 IU/L (ref 0–32)
AST: 19 IU/L (ref 0–40)
Albumin: 3.9 g/dL (ref 3.6–4.8)
BILIRUBIN TOTAL: 0.7 mg/dL (ref 0.0–1.2)
BUN / CREAT RATIO: 24 (ref 12–28)
BUN: 26 mg/dL (ref 8–27)
CHLORIDE: 102 mmol/L (ref 96–106)
CO2: 25 mmol/L (ref 18–29)
CREATININE: 1.08 mg/dL — AB (ref 0.57–1.00)
Calcium: 9.3 mg/dL (ref 8.7–10.3)
GFR calc Af Amer: 61 mL/min/{1.73_m2} (ref >59)
GFR calc non Af Amer: 53 mL/min/{1.73_m2} — ABNORMAL LOW (ref >59)
GLOBULIN, TOTAL: 3 g/dL (ref 1.5–4.5)
Glucose: 95 mg/dL (ref 65–99)
POTASSIUM: 4.7 mmol/L (ref 3.5–5.2)
SODIUM: 141 mmol/L (ref 134–144)
Total Protein: 6.9 g/dL (ref 6.0–8.5)

## 2016-05-13 LAB — LIPID PANEL
CHOLESTEROL TOTAL: 132 mg/dL (ref 100–199)
Chol/HDL Ratio: 3.6 ratio units (ref 0.0–4.4)
HDL: 37 mg/dL — ABNORMAL LOW (ref >39)
LDL Calculated: 66 mg/dL (ref 0–99)
TRIGLYCERIDES: 145 mg/dL (ref 0–149)
VLDL Cholesterol Cal: 29 mg/dL (ref 5–40)

## 2016-05-13 LAB — TSH: TSH: 4.43 u[IU]/mL (ref 0.450–4.500)

## 2016-05-26 ENCOUNTER — Ambulatory Visit (INDEPENDENT_AMBULATORY_CARE_PROVIDER_SITE_OTHER): Payer: Medicare Other | Admitting: Nurse Practitioner

## 2016-05-26 ENCOUNTER — Encounter: Payer: Self-pay | Admitting: Nurse Practitioner

## 2016-05-26 VITALS — BP 128/72 | HR 74 | Temp 97.0°F | Ht 66.0 in | Wt 229.0 lb

## 2016-05-26 DIAGNOSIS — Z79891 Long term (current) use of opiate analgesic: Secondary | ICD-10-CM

## 2016-05-26 DIAGNOSIS — Z0289 Encounter for other administrative examinations: Secondary | ICD-10-CM | POA: Diagnosis not present

## 2016-05-26 DIAGNOSIS — M47816 Spondylosis without myelopathy or radiculopathy, lumbar region: Secondary | ICD-10-CM | POA: Diagnosis not present

## 2016-05-26 MED ORDER — TRAMADOL HCL 50 MG PO TABS: 50.0000 mg | ORAL_TABLET | Freq: Every day | ORAL | 0 refills | Status: DC

## 2016-05-26 MED ORDER — TRAMADOL HCL 50 MG PO TABS: 50.0000 mg | ORAL_TABLET | Freq: Every day | ORAL | 0 refills | Status: DC | PRN

## 2016-05-26 NOTE — Progress Notes (Signed)
Subjective:    Patient ID: Kimberly Walsh, female    DOB: [birth year], 70 y.o.   MRN: [id number]  HPI Patient here today for pain management.  [place] Controlled Substance Abuse database reviewed- Yes If yes- were their any concerning findings : no suspicious activity. Depression screen [facility] 2/9 05/26/2016 05/05/2016 02/20/2016 02/17/2016 08/05/2015  Decreased Interest 0 0 0 0 0  Down, Depressed, Hopeless 0 0 0 0 1  PHQ - 2 Score 0 0 0 0 1    GAD 7 : Generalized Anxiety Score 05/26/2016  Nervous, Anxious, on Edge 0  Control/stop worrying 0  Worry too much - different things 0  Trouble relaxing 0  Restless 0  Easily annoyed or irritable 0  Afraid - awful might happen 0  Total GAD 7 Score 0  Anxiety Difficulty Not difficult at all    [place] drug screen performed- Yes  SOAPP  0= never  1= seldom  2=sometimes  3= often  4= very often  How often do you have mood swings? 0 How often do you smoke a cigarette within an hour after waling up? 0 How often have you taken medication other than the way that it was prescribed?0 How often have you used illegal drugs in the past 5 years? 0 How often, in your lifetime, have you had legal problems or been arrested? 0  Score 0  Alcohol Audit - How often during the last year have found that you: 0-Never   1- Less than monthly   2- Monthly     3-Weekly     4-daily or almost daily  - found that you were not able to stop drinking once you started- 0 -failed to do what was normally expected of you because of drinking- 0 -needed a first drink in the morning- 0 -had a feeling of guilt or remorse after drinking- 0 -are/were unable to remember 0what happened the night before because of your drinking- 0  0- NO   2- yes but not in last year  4- yes during last year -Have you or someone else been injured because of your drinking- 0 - Has anyone been concerned about your drinking or suggested you cut down- 0        TOTAL- 0  ( 0-7-  alcohol education, 8-15- simple advice, 16-19 simple advice plus counseling, 20-40 referral for evaluation and treatment 0   Designated Pharmacy- [facility]  Pain assessment: Cause of pain- osteoarthritis Pain location- hands and bil knees Pain on scale of 1-10- 8/10 without meds and 3/10 with medication Frequency- daily What increases pain-nothing really What makes pain Better-pain meds help but does not relieve 100% of pain- knees get better once she gets up and starts moving. Effects on ADL - none  Prior treatments tried and failed- nothing Current treatments- tramadol 50mg  1 daily Morphine mg equivalent- 5  Pain management agreement reviewed and signed- Yes     Review of Systems  Musculoskeletal: Positive for joint swelling.  All other systems reviewed and are negative.      Objective:   Physical Exam  Cardiovascular: Normal rate, regular rhythm and normal heart sounds.   Pulmonary/Chest: Effort normal and breath sounds normal.  Musculoskeletal:  All PIP joints of bil hands are edematous with nodularity and slight deformity- painful making a fist. No knee effusions noted- crepitus aon extension and flexion.    BP 128/72   Pulse 74   Temp 97 F (36.1 C) (Oral)   Ht 5'  6" (1.676 m)   Wt 229 lb (103.9 kg)   BMI 36.96 kg/m        Assessment & Plan:   1. Pain management contract agreement   2. Osteoarthritis of lumbar spine, unspecified spinal osteoarthritis complication status   3. Admission for long-term opiate use    Meds ordered this encounter  Medications  . traMADol (ULTRAM) 50 MG tablet    Sig: Take 1 tablet (50 mg total) by mouth daily as needed.    Dispense:  30 tablet    Refill:  0    DO NOT FILL TILL 06/03/16    Order Specific Question:   Supervising Provider    Answer:   Assunta Found L [[id number]]  . traMADol (ULTRAM) 50 MG tablet    Sig: Take 1 tablet (50 mg total) by mouth daily.    Dispense:  30 tablet    Refill:  0    DO NOT FILL TILL  07/03/16    Order Specific Question:   Supervising Provider    Answer:   Assunta Found L [[id number]]  . traMADol (ULTRAM) 50 MG tablet    Sig: Take 1 tablet (50 mg total) by mouth daily.    Dispense:  30 tablet    Refill:  0    DO NOT FILL TILL 08/03/15    Order Specific Question:   Supervising Provider    Answer:   Eustaquio Maize [[id number]]   Follow up in 3 months  [place], FNP

## 2016-05-26 NOTE — Patient Instructions (Signed)

## 2016-06-01 LAB — TOXASSURE SELECT 13 (MW), URINE

## 2016-06-17 ENCOUNTER — Encounter: Payer: Medicare Other | Admitting: *Deleted

## 2016-06-17 DIAGNOSIS — Z1231 Encounter for screening mammogram for malignant neoplasm of breast: Secondary | ICD-10-CM | POA: Diagnosis not present

## 2016-07-14 ENCOUNTER — Encounter: Payer: Self-pay | Admitting: Family Medicine

## 2016-07-14 ENCOUNTER — Ambulatory Visit (INDEPENDENT_AMBULATORY_CARE_PROVIDER_SITE_OTHER): Payer: Medicare Other | Admitting: Family Medicine

## 2016-07-14 VITALS — BP 136/70 | HR 80 | Temp 97.9°F | Ht 66.0 in | Wt 229.0 lb

## 2016-07-14 DIAGNOSIS — J069 Acute upper respiratory infection, unspecified: Secondary | ICD-10-CM

## 2016-07-14 DIAGNOSIS — M546 Pain in thoracic spine: Secondary | ICD-10-CM

## 2016-07-14 MED ORDER — PREDNISONE 10 MG PO TABS: ORAL_TABLET | ORAL | 0 refills | Status: DC

## 2016-07-14 MED ORDER — CYCLOBENZAPRINE HCL 10 MG PO TABS: 10.0000 mg | ORAL_TABLET | Freq: Three times a day (TID) | ORAL | 1 refills | Status: DC | PRN

## 2016-07-14 NOTE — Progress Notes (Signed)
Subjective:  Patient ID: [name], female    DOB: 19-Jan-1946  Age: 71 y.o. MRN: [id number]  CC: Back Pain (pt here today c/o back pain )   HPI GILMA BIAS presents for Onset over the last 2 days of increasing discomfort at the angle of the scapula is where she points. This is on the right. She says it also goes up slightly. It is a moderate pain. It is uncomfortable when she reaches over shoulder especially. She is leaving on a cruise in a couple of days and wants to be sure she does not have any problems there. She takes tramadol for chronic pain. Additionally she is concerned about some upper respiratory symptoms, see review of systems.  History Sawda has a past medical history of Hyperlipidemia; Venous stasis; and Vertigo.   She has a past surgical history that includes Middle ear surgery; Finger surgery (Right); Cholecystectomy; Tubal ligation; and Vein Surgery.   Her family history includes COPD in her father.She reports that she has never smoked. She does not have any smokeless tobacco history on file. She reports that she does not drink alcohol or use drugs.  Current Outpatient Prescriptions on File Prior to Visit  Medication Sig Dispense Refill  . atorvastatin (LIPITOR) 40 MG tablet Take 1 tablet (40 mg total) by mouth daily. 30 tablet 5  . furosemide (LASIX) 40 MG tablet Take 1 tablet (40 mg total) by mouth daily. 30 tablet 5  . levothyroxine (SYNTHROID, LEVOTHROID) 25 MCG tablet Take 1 tablet (25 mcg total) by mouth daily. as directed 30 tablet 6  . meclizine (ANTIVERT) 25 MG tablet TAKE ONE TAB EVERY 6 HOURS AS NEEDED FOR VERTIGO 30 tablet 1  . meloxicam (MOBIC) 15 MG tablet Take 1 tablet (15 mg total) by mouth daily. 30 tablet 5  . traMADol (ULTRAM) 50 MG tablet Take 1 tablet (50 mg total) by mouth daily as needed. 30 tablet 0  . traMADol (ULTRAM) 50 MG tablet Take 1 tablet (50 mg total) by mouth daily. 30 tablet 0  . traMADol (ULTRAM) 50 MG tablet Take 1 tablet (50  mg total) by mouth daily. 30 tablet 0  . valsartan (DIOVAN) 160 MG tablet Take 1 tablet (160 mg total) by mouth daily. 30 tablet 5   No current facility-administered medications on file prior to visit.     ROS Review of Systems  Constitutional: Negative for activity change, appetite change and fever.  HENT: Positive for congestion and rhinorrhea (sniffles). Negative for ear pain and sore throat.   Respiratory: Negative for cough.   Musculoskeletal: Positive for myalgias.  Neurological: Positive for headaches (today - frontal ache).    Objective:  BP 136/70   Pulse 80   Temp 97.9 F (36.6 C) (Oral)   Ht 5\' 6"  (1.676 m)   Wt 229 lb (103.9 kg)   BMI 36.96 kg/m   Physical Exam  Constitutional: She is oriented to person, place, and time. She appears well-developed and well-nourished. No distress.  HENT:  Head: Normocephalic and atraumatic.  Eyes: Conjunctivae are normal. Pupils are equal, round, and reactive to light.  Neck: Normal range of motion. Neck supple. No thyromegaly present.  Cardiovascular: Normal rate, regular rhythm and normal heart sounds.   No murmur heard. Pulmonary/Chest: Effort normal and breath sounds normal. No respiratory distress. She has no wheezes. She has no rales.  Abdominal: Soft. There is no tenderness.  Musculoskeletal: Normal range of motion. She exhibits tenderness (at right angle of the  scapula and in the midscapular region of the right). She exhibits no deformity.  Painful for active abduction, right shoulder   Lymphadenopathy:    She has no cervical adenopathy.  Neurological: She is alert and oriented to person, place, and time.  Skin: Skin is warm and dry.  Psychiatric: She has a normal mood and affect. Her behavior is normal. Judgment and thought content normal.    Assessment & Plan:   Nieve was seen today for back pain.  Diagnoses and all orders for this visit:  Acute right-sided thoracic back pain  Acute upper respiratory  infection  Other orders -     cyclobenzaprine (FLEXERIL) 10 MG tablet; Take 1 tablet (10 mg total) by mouth 3 (three) times daily as needed for muscle spasms. -     predniSONE (DELTASONE) 10 MG tablet; Take 5 daily for 2 days followed by 4,3,2 and 1 for 2 days each.   I am having Ms. Cadmus start on cyclobenzaprine and predniSONE. I am also having her maintain her levothyroxine, furosemide, valsartan, meloxicam, atorvastatin, meclizine, traMADol, traMADol, and traMADol.  Meds ordered this encounter  Medications  . cyclobenzaprine (FLEXERIL) 10 MG tablet    Sig: Take 1 tablet (10 mg total) by mouth 3 (three) times daily as needed for muscle spasms.    Dispense:  90 tablet    Refill:  1  . predniSONE (DELTASONE) 10 MG tablet    Sig: Take 5 daily for 2 days followed by 4,3,2 and 1 for 2 days each.    Dispense:  30 tablet    Refill:  0     Follow-up: Return if symptoms worsen or fail to improve.  Claretta Fraise, M.D.

## 2016-08-17 ENCOUNTER — Telehealth: Payer: Self-pay | Admitting: Nurse Practitioner

## 2016-08-17 NOTE — Telephone Encounter (Signed)
TC from [place] they carry the [employer] brand levothyroxine, she was previously on a different brand when getting meds at [facility]. They need to get permission in case they need labs drawn. Last TSH was done 05/12/16.

## 2016-08-17 NOTE — Telephone Encounter (Signed)
Pharmacy aware ok to change manufacturers of thyroid medications.  LMOVM to patient to have labwork drawn in a month

## 2016-08-17 NOTE — Telephone Encounter (Signed)
Yes, ok to change. Will forward to MMM so she is aware. Should have TSH drawn next visit.

## 2016-08-18 ENCOUNTER — Other Ambulatory Visit: Payer: Self-pay | Admitting: Nurse Practitioner

## 2016-08-18 DIAGNOSIS — E034 Atrophy of thyroid (acquired): Secondary | ICD-10-CM

## 2016-08-18 DIAGNOSIS — I10 Essential (primary) hypertension: Secondary | ICD-10-CM

## 2016-09-28 ENCOUNTER — Ambulatory Visit (INDEPENDENT_AMBULATORY_CARE_PROVIDER_SITE_OTHER): Payer: Medicare Other | Admitting: Nurse Practitioner

## 2016-09-28 ENCOUNTER — Encounter: Payer: Self-pay | Admitting: Nurse Practitioner

## 2016-09-28 VITALS — BP 134/84 | HR 51 | Temp 97.4°F | Ht 66.0 in | Wt 235.0 lb

## 2016-09-28 DIAGNOSIS — E782 Mixed hyperlipidemia: Secondary | ICD-10-CM | POA: Diagnosis not present

## 2016-09-28 DIAGNOSIS — Z6837 Body mass index (BMI) 37.0-37.9, adult: Secondary | ICD-10-CM

## 2016-09-28 DIAGNOSIS — Z23 Encounter for immunization: Secondary | ICD-10-CM

## 2016-09-28 DIAGNOSIS — I10 Essential (primary) hypertension: Secondary | ICD-10-CM

## 2016-09-28 DIAGNOSIS — Z1211 Encounter for screening for malignant neoplasm of colon: Secondary | ICD-10-CM

## 2016-09-28 DIAGNOSIS — M47816 Spondylosis without myelopathy or radiculopathy, lumbar region: Secondary | ICD-10-CM

## 2016-09-28 DIAGNOSIS — R609 Edema, unspecified: Secondary | ICD-10-CM

## 2016-09-28 DIAGNOSIS — Z1212 Encounter for screening for malignant neoplasm of rectum: Secondary | ICD-10-CM

## 2016-09-28 DIAGNOSIS — E034 Atrophy of thyroid (acquired): Secondary | ICD-10-CM

## 2016-09-28 MED ORDER — ATORVASTATIN CALCIUM 40 MG PO TABS
40.0000 mg | ORAL_TABLET | Freq: Every day | ORAL | 5 refills | Status: DC
Start: 2016-09-28 — End: 2016-11-03

## 2016-09-28 MED ORDER — TRAMADOL HCL 50 MG PO TABS
50.0000 mg | ORAL_TABLET | Freq: Every day | ORAL | 0 refills | Status: DC
Start: 2016-09-28 — End: 2017-01-12

## 2016-09-28 MED ORDER — TRAMADOL HCL 50 MG PO TABS: 50.0000 mg | ORAL_TABLET | Freq: Every day | ORAL | 0 refills | Status: DC

## 2016-09-28 MED ORDER — FUROSEMIDE 40 MG PO TABS: 40.0000 mg | ORAL_TABLET | Freq: Every day | ORAL | 5 refills | Status: DC

## 2016-09-28 MED ORDER — TRAMADOL HCL 50 MG PO TABS: 50.0000 mg | ORAL_TABLET | Freq: Every day | ORAL | 0 refills | Status: DC | PRN

## 2016-09-28 MED ORDER — VALSARTAN 160 MG PO TABS: 160.0000 mg | ORAL_TABLET | Freq: Every day | ORAL | 5 refills | Status: DC

## 2016-09-28 NOTE — Patient Instructions (Signed)
Knee Osteoarthritis Treatment Decision Aid Introduction If you have osteoarthritis in your knees, you may have several treatment options. This document will review two of these options.  What are my treatment options?  Surgery  Non-surgical treatment may include one option or a combination of several of the following options:: Surgery for this condition is total knee replacement.  Exercise programs and working with a physical therapist.: This is a procedure to replace the knee joint with an artificial (prosthetic) knee joint.  Devices to help you move around (assistive devices), like a brace, wrap, splint, or wedge insoles.: It replaces parts of the thigh bone (femur), lower leg bone (tibia), and kneecap (patella) that are removed during the procedure.  Medicines.:  Shots of medicine to relieve pain and inflammation, such as steroids and hyaluronic acid.:  Using a small device which delivers mild pulses through to nerve endings to relieve pain (transcutaneous electrical stimulation, TENS).:  Applying heat and cold to the knee.:  Massage.:  Insertion of needles into certain places on the skin to relieve pain (acupuncture).:  A plan to control your weight or to lose weight, if you are overweight.: Who is eligible?  Good candidates may include:  Surgery  People who have pain that is tolerable.: People who have pain that does not get better with non-surgical treatments and medicines.  People who are not allergic to medicines that are used.: People who have moderate or severe pain that makes it hard to do physical activities like walking, rising from a chair, or using stairs.  People who do not have an illness that makes medicines or injections risky.: People who have pain that affects quality of sleep.  People who are motivated to do exercise programs or lose weight.: People who do not have an illness that makes surgery risky.  People who do not have an infection, too much fluid,  or skin breakdown in the knee area.: What are the benefits?  Benefits may include:  Surgery  Improved mobility and less pain.: Improved mobility and less pain.  Not needing surgery.: Pain likely being gone within 2 years after surgery.  Pain relief that lasts for 6-12 months, starting 1-2 days after steroid injections.:  Pain relief that lasts for several months, starting several weeks after hyaluronic acid injections.: What are the risks?  Risks may include:  Surgery  Failure to relieve symptoms.: Failure to relieve symptoms.  Joint damage that may continue to get worse.: Instability of the knee.  Medicine or injection side effects.: Damage to blood vessels, nerves, or other structures in the knee.  Bruising or bleeding with acupuncture therapy.: Bleeding or a blood clot.  Skin injury due to incorrect application of heat or cold therapy.: Infection.  Bruising due to massage.: Decreased range of motion of the knee.  Increased pain due to exercise programs.: Loosening of the prosthetic joint.  : Needing knee replacement surgery again in 15-20 years to replace the prosthetic joint. What preparation is needed?  Preparation may include:  Surgery  Physical evaluation. This may involve lab work and imaging tests, such as X-rays, MRI, CT scan, or bone scans.: Physical evaluation. This may involve lab work and imaging tests, such as X-rays, MRI, CT scan, or bone scans.  Planning to have someone take you home from the hospital or clinic, if you have knee injections.: Planning to have someone take you home from the hospital.  : Arranging for someone to help you for a few days after surgery.  : Working with  specialists, if you have other medical conditions.  : Preparing your home for your recovery.  : Losing weight, if recommended by your provider.  : Not using any products that contain nicotine or tobacco, such as cigarettes and e-cigarettes. If you need help quitting, ask  your health care provider.  : Doing exercises as directed by your provider.  : Having dental care and routine cleanings completed before your procedure. (You should not have dental work done for 3 months after your procedure.) The exact tests and frequency of office visits will be decided by your health care provider based on your individual condition. What are the restrictions after?  Restrictions may include:  Surgery  Needing to rest after pain-relief therapies.: Not taking baths, swimming, or using a hot tub until your provider approves.  Not driving, if you are taking medicines that make you sleepy.: Not playing contact sports until your provider approves.  Avoiding activities that require a lot of energy for a couple of days after knee injections.: Not driving or using heavy machinery until your provider approves. What can I expect from recovery?  You may experience:  Surgery  Tiredness, after therapies for pain relief.: Pain. Medicines and pain relief techniques will help make pain tolerable.  Needing to move your knee through its full range of motion after knee injections, to get all of the medicine into your knee joint.: Fluid draining from your incision through a tube (drain) for a couple of days.  Monitoring for a few hours after knee injections to make sure you do not have a reaction to the medicine.: Limited range of motion in the knee that gradually improves over time. What is the impact to quality of life?  Impact to quality of life may include:  Surgery  Living with certain side effects of medicines.: Needing to stay in the hospital for a few days after your procedure.  Needing up to five injections over a period of five weeks, for certain kinds of hyaluronic acid injections.: Needing to use a walker for several weeks.  Limited physical activity, depending on your symptoms.: Limited physical activity for several weeks.  Impact to your daily schedule, including  work, to make time for therapy and follow-up visits.: Impact to your daily schedule, including work, to make time for therapy and follow-up visits.  :  : The frequency of office visits, therapy, and injections will be decided by your health care provider and care team based on your individual condition. What is the cost?  Costs may include:  Surgery - $$$  Ongoing medicines.: Procedures and recovery.  Office visits.: Office visits.  Therapy visits.: Therapy visits.  Knee injection procedures.:  Assistive devices.: Exact costs will vary based on your location and insurance plan. Contact your insurance company to find out what is covered. Questions to ask your health care provider:  Ask:  Surgery  Am I eligible for this option?: Am I eligible for this option?  What are my personal risks?: What are my personal risks?  How often will I need office or therapy visits?: How often will I need office or therapy visits?  What are the chances that I will need future treatment?: What are the chances that I will need another surgery?  : How effective is the surgery? Follow these instructions at home: Review these options and decide which one may be right for you.  My decision:  Non-Surgical Treatment  This is right for me:  This is not right for me:  I am unsure right now:  Surgery  This is right for me:  This is not right for me:  I am unsure right now: This information is not intended to replace advice given to you by your health care provider. Make sure you discuss any questions you have with your health care provider. Document Released: 04/30/2016 Document Revised: 04/30/2016 Document Reviewed: 04/30/2016 Elsevier Interactive Patient Education  2017 [employer].

## 2016-09-28 NOTE — Addendum Note (Signed)
Addended by: Rolena Infante on: 09/28/2016 03:38 PM   Modules accepted: Orders

## 2016-09-28 NOTE — Progress Notes (Signed)
Subjective:    Patient ID: Kimberly Walsh, female    DOB: 11/07/1945, 71 y.o.   MRN: [id number]  HPI  Kimberly Walsh is here today for follow up of chronic medical problem.  Outpatient Encounter Prescriptions as of 09/28/2016  Medication Sig  . atorvastatin (LIPITOR) 40 MG tablet Take 1 tablet (40 mg total) by mouth daily.  . cyclobenzaprine (FLEXERIL) 10 MG tablet Take 1 tablet (10 mg total) by mouth 3 (three) times daily as needed for muscle spasms.  . furosemide (LASIX) 40 MG tablet Take 1 tablet (40 mg total) by mouth daily.  Marland Kitchen levothyroxine (SYNTHROID, LEVOTHROID) 25 MCG tablet Take 1 Tablet by mouth once daily as instructed  . meclizine (ANTIVERT) 25 MG tablet TAKE ONE TAB EVERY 6 HOURS AS NEEDED FOR VERTIGO  . meloxicam (MOBIC) 15 MG tablet Take 1 tablet (15 mg total) by mouth daily.  . traMADol (ULTRAM) 50 MG tablet Take 1 tablet (50 mg total) by mouth daily as needed.  . traMADol (ULTRAM) 50 MG tablet Take 1 tablet (50 mg total) by mouth daily.  . traMADol (ULTRAM) 50 MG tablet Take 1 tablet (50 mg total) by mouth daily.  . valsartan (DIOVAN) 160 MG tablet Take 1 Tablet by mouth once daily    1. Essential hypertension, benign  No c/o CP,HA or SOB- does not check blood pressure at home  2. Hypothyroidism due to acquired atrophy of thyroid  No issues  3. Osteoarthritis of lumbar spine, unspecified spinal osteoarthritis complication status  Always has joint pain- mainly in knees Pain assessment: Cause of pain- osteoarthritis Pain location- scattered- but worse in knees currently Pain on scale of 1-10- 9/10- ( ran out of meds last week Frequency- daily What increases pain-lots of walking What makes pain Better-sitting Effects on ADL - not much affect Any change in general medical condition-none  Current medications- ultram 65m 1 po qd Effectiveness of current meds-brings pain down to around 2/10 Adverse reactions form pain meds-none Morphine equivalent<20  Pill  count performed-No Urine drug screen- No [place]    4. Mixed hyperlipidemia  Does not watch diet- very little exercise due to knee pain  5. BMI 37.0-37.9, adult  No recent weight gain or weight loss    New complaints: None today     Review of Systems  Constitutional: Negative.   HENT: Negative.   Respiratory: Negative.   Cardiovascular: Negative.   Gastrointestinal: Negative.   Genitourinary: Negative.   Musculoskeletal: Positive for joint swelling (bil knees).  Neurological: Negative.   Psychiatric/Behavioral: Negative.   All other systems reviewed and are negative.      Objective:   Physical Exam  Constitutional: She is oriented to person, place, and time. She appears well-developed and well-nourished.  HENT:  Nose: Nose normal.  Mouth/Throat: Oropharynx is clear and moist.  Eyes: EOM are normal.  Neck: Trachea normal, normal range of motion and full passive range of motion without pain. Neck supple. No JVD present. Carotid bruit is not present. No thyromegaly present.  Cardiovascular: Normal rate, regular rhythm, normal heart sounds and intact distal pulses.  Exam reveals no gallop and no friction rub.   No murmur heard. Pulmonary/Chest: Effort normal and breath sounds normal.  Abdominal: Soft. Bowel sounds are normal. She exhibits no distension and no mass. There is no tenderness.  Musculoskeletal: Normal range of motion.  Mild bil knee effusions with crepitus on flexion and extension  Lymphadenopathy:    She has no  cervical adenopathy.  Neurological: She is alert and oriented to person, place, and time. She has normal reflexes.  Skin: Skin is warm and dry.  Psychiatric: She has a normal mood and affect. Her behavior is normal. Judgment and thought content normal.      BP 134/84 (BP Location: Left Arm, Cuff Size: Large)   Pulse (!) 51   Temp 97.4 F (36.3 C) (Oral)   Ht 5' 6"  (1.676 m)   Wt 235 lb (106.6 kg)   BMI 37.93 kg/m         Assessment & Plan:  1. Essential hypertension, benign Low sodium diet - CMP14+EGFR - valsartan (DIOVAN) 160 MG tablet; Take 1 tablet (160 mg total) by mouth daily.  Dispense: 30 tablet; Refill: 5  2. Hypothyroidism due to acquired atrophy of thyroid - Thyroid Panel With TSH  3. Osteoarthritis of lumbar spine, unspecified spinal osteoarthritis complication status nonweight bearing exercise encourged - traMADol (ULTRAM) 50 MG tablet; Take 1 tablet (50 mg total) by mouth daily as needed.  Dispense: 30 tablet; Refill: 0 - traMADol (ULTRAM) 50 MG tablet; Take 1 tablet (50 mg total) by mouth daily.  Dispense: 30 tablet; Refill: 0 - traMADol (ULTRAM) 50 MG tablet; Take 1 tablet (50 mg total) by mouth daily.  Dispense: 30 tablet; Refill: 0  4. Mixed hyperlipidemia Low fat diet - Lipid panel - atorvastatin (LIPITOR) 40 MG tablet; Take 1 tablet (40 mg total) by mouth daily.  Dispense: 30 tablet; Refill: 5  5. BMI 37.0-37.9, adult Discussed diet and exercise for person with BMI >25 Will recheck weight in 3-6 months  6. Peripheral edema Continue to wear compression hose - furosemide (LASIX) 40 MG tablet; Take 1 tablet (40 mg total) by mouth daily.  Dispense: 30 tablet; Refill: 5   hemoccult cards given to patient with directions Labs pending Health maintenance reviewed Diet and exercise encouraged Continue all meds Follow up  In 3 months   [place], FNP

## 2016-09-29 LAB — LIPID PANEL
CHOL/HDL RATIO: 4.6 ratio — AB (ref 0.0–4.4)
Cholesterol, Total: 157 mg/dL (ref 100–199)
HDL: 34 mg/dL — AB (ref >39)
LDL Calculated: 67 mg/dL (ref 0–99)
Triglycerides: 278 mg/dL — ABNORMAL HIGH (ref 0–149)
VLDL Cholesterol Cal: 56 mg/dL — ABNORMAL HIGH (ref 5–40)

## 2016-09-29 LAB — CMP14+EGFR
ALBUMIN: 3.8 g/dL (ref 3.6–4.8)
ALK PHOS: 127 IU/L — AB (ref 39–117)
ALT: 30 IU/L (ref 0–32)
AST: 23 IU/L (ref 0–40)
Albumin/Globulin Ratio: 1.1 — ABNORMAL LOW (ref 1.2–2.2)
BUN/Creatinine Ratio: 20 (ref 12–28)
BUN: 21 mg/dL (ref 8–27)
Bilirubin Total: 0.4 mg/dL (ref 0.0–1.2)
CO2: 29 mmol/L (ref 18–29)
CREATININE: 1.06 mg/dL — AB (ref 0.57–1.00)
Calcium: 9.1 mg/dL (ref 8.7–10.3)
Chloride: 103 mmol/L (ref 96–106)
GFR calc Af Amer: 62 mL/min/{1.73_m2} (ref >59)
GFR calc non Af Amer: 54 mL/min/{1.73_m2} — ABNORMAL LOW (ref >59)
GLUCOSE: 87 mg/dL (ref 65–99)
Globulin, Total: 3.5 g/dL (ref 1.5–4.5)
Potassium: 4.1 mmol/L (ref 3.5–5.2)
Sodium: 143 mmol/L (ref 134–144)
Total Protein: 7.3 g/dL (ref 6.0–8.5)

## 2016-09-29 LAB — THYROID PANEL WITH TSH
FREE THYROXINE INDEX: 1.5 (ref 1.2–4.9)
T3 Uptake Ratio: 25 % (ref 24–39)
T4, Total: 6.1 ug/dL (ref 4.5–12.0)
TSH: 3.99 u[IU]/mL (ref 0.450–4.500)

## 2016-10-01 ENCOUNTER — Other Ambulatory Visit: Payer: Self-pay | Admitting: *Deleted

## 2016-10-01 DIAGNOSIS — R42 Dizziness and giddiness: Secondary | ICD-10-CM

## 2016-10-01 MED ORDER — MECLIZINE HCL 25 MG PO TABS: ORAL_TABLET | ORAL | 1 refills | Status: DC

## 2016-11-03 ENCOUNTER — Other Ambulatory Visit: Payer: Self-pay

## 2016-11-03 DIAGNOSIS — E782 Mixed hyperlipidemia: Secondary | ICD-10-CM

## 2016-11-03 MED ORDER — ATORVASTATIN CALCIUM 40 MG PO TABS: 40.0000 mg | ORAL_TABLET | Freq: Every day | ORAL | 0 refills | Status: DC

## 2016-11-26 ENCOUNTER — Other Ambulatory Visit: Payer: Self-pay | Admitting: Nurse Practitioner

## 2017-01-06 ENCOUNTER — Telehealth: Payer: Self-pay | Admitting: Nurse Practitioner

## 2017-01-06 NOTE — Telephone Encounter (Signed)
Please advise 

## 2017-01-12 ENCOUNTER — Encounter: Payer: Self-pay | Admitting: Nurse Practitioner

## 2017-01-12 ENCOUNTER — Ambulatory Visit (INDEPENDENT_AMBULATORY_CARE_PROVIDER_SITE_OTHER): Payer: Medicare Other | Admitting: Nurse Practitioner

## 2017-01-12 VITALS — BP 146/81 | HR 91 | Temp 97.8°F | Ht 66.0 in | Wt 234.0 lb

## 2017-01-12 DIAGNOSIS — E782 Mixed hyperlipidemia: Secondary | ICD-10-CM

## 2017-01-12 DIAGNOSIS — Z1211 Encounter for screening for malignant neoplasm of colon: Secondary | ICD-10-CM

## 2017-01-12 DIAGNOSIS — I10 Essential (primary) hypertension: Secondary | ICD-10-CM | POA: Diagnosis not present

## 2017-01-12 DIAGNOSIS — L03115 Cellulitis of right lower limb: Secondary | ICD-10-CM

## 2017-01-12 DIAGNOSIS — M47816 Spondylosis without myelopathy or radiculopathy, lumbar region: Secondary | ICD-10-CM | POA: Diagnosis not present

## 2017-01-12 DIAGNOSIS — Z6837 Body mass index (BMI) 37.0-37.9, adult: Secondary | ICD-10-CM

## 2017-01-12 DIAGNOSIS — E034 Atrophy of thyroid (acquired): Secondary | ICD-10-CM | POA: Diagnosis not present

## 2017-01-12 DIAGNOSIS — Z1212 Encounter for screening for malignant neoplasm of rectum: Secondary | ICD-10-CM

## 2017-01-12 DIAGNOSIS — R609 Edema, unspecified: Secondary | ICD-10-CM

## 2017-01-12 MED ORDER — MELOXICAM 15 MG PO TABS: 15.0000 mg | ORAL_TABLET | Freq: Every day | ORAL | 3 refills | Status: DC

## 2017-01-12 MED ORDER — TRAMADOL HCL 50 MG PO TABS: 50.0000 mg | ORAL_TABLET | Freq: Every day | ORAL | 0 refills | Status: DC

## 2017-01-12 MED ORDER — LOSARTAN POTASSIUM 100 MG PO TABS: 100.0000 mg | ORAL_TABLET | Freq: Every day | ORAL | 3 refills | Status: DC

## 2017-01-12 MED ORDER — ATORVASTATIN CALCIUM 40 MG PO TABS: 40.0000 mg | ORAL_TABLET | Freq: Every day | ORAL | 0 refills | Status: DC

## 2017-01-12 MED ORDER — CIPROFLOXACIN HCL 500 MG PO TABS: 500.0000 mg | ORAL_TABLET | Freq: Two times a day (BID) | ORAL | 0 refills | Status: DC

## 2017-01-12 MED ORDER — TRAMADOL HCL 50 MG PO TABS: 50.0000 mg | ORAL_TABLET | Freq: Every day | ORAL | 0 refills | Status: DC | PRN

## 2017-01-12 MED ORDER — FUROSEMIDE 40 MG PO TABS: 40.0000 mg | ORAL_TABLET | Freq: Every day | ORAL | 5 refills | Status: DC

## 2017-01-12 MED ORDER — TENDERWRAP UNNA BOOT BANDAGE EX MISC: 1.0000 | Freq: Once | CUTANEOUS | 0 refills | Status: AC

## 2017-01-12 NOTE — Patient Instructions (Signed)

## 2017-01-12 NOTE — Progress Notes (Signed)
Subjective:    Patient ID: Kimberly Walsh, female    DOB: July 28, 1945, 71 y.o.   MRN: [id number]  HPI  COREN CROWNOVER is here today for follow up of chronic medical problem.  Outpatient Encounter Prescriptions as of 01/12/2017  Medication Sig  . atorvastatin (LIPITOR) 40 MG tablet Take 1 tablet (40 mg total) by mouth daily.  . cyclobenzaprine (FLEXERIL) 10 MG tablet Take 1 tablet (10 mg total) by mouth 3 (three) times daily as needed for muscle spasms.  . furosemide (LASIX) 40 MG tablet Take 1 tablet (40 mg total) by mouth daily.  Marland Kitchen levothyroxine (SYNTHROID, LEVOTHROID) 25 MCG tablet Take 1 Tablet by mouth once daily as instructed  . meclizine (ANTIVERT) 25 MG tablet TAKE ONE TAB EVERY 6 HOURS AS NEEDED FOR VERTIGO  . meloxicam (MOBIC) 15 MG tablet Take 1 Tablet by mouth once daily  . traMADol (ULTRAM) 50 MG tablet Take 1 tablet (50 mg total) by mouth daily as needed.  . traMADol (ULTRAM) 50 MG tablet Take 1 tablet (50 mg total) by mouth daily.  . traMADol (ULTRAM) 50 MG tablet Take 1 tablet (50 mg total) by mouth daily.  . valsartan (DIOVAN) 160 MG tablet Take 1 tablet (160 mg total) by mouth daily.   No facility-administered encounter medications on file as of 01/12/2017.     1. Essential hypertension, benign  No c/o chest pain,SOB or headache. Does not check blood pressures at home. Valsartan has been recalled so patient has not taken in about a week.  2. Hypothyroidism due to acquired atrophy of thyroid  Not having any problems that she is awareof  3. Mixed hyperlipidemia  tries to avoid fried foods in diet  4. BMI 37.0-37.9, adult  No recent weight changes  5. Osteoarthritis of lumbar spine, unspecified spinal osteoarthritis complication status  Batient has constant pain form arthritis. SHe takes ultram as needed daily    New complaints: None today  Social history: Sister passed away 2 weeks ago- lived in [place] near some people that I go to church  with.    Review of Systems  Constitutional: Negative for activity change and appetite change.  HENT: Negative.   Eyes: Negative for pain.  Respiratory: Negative for shortness of breath.   Cardiovascular: Positive for leg swelling (mainly right leg). Negative for chest pain and palpitations.  Gastrointestinal: Negative for abdominal pain.  Endocrine: Negative for polydipsia.  Genitourinary: Negative.   Musculoskeletal: Positive for arthralgias.  Skin: Negative for rash.  Neurological: Negative for dizziness, weakness and headaches.  Hematological: Does not bruise/bleed easily.  Psychiatric/Behavioral: Negative.   All other systems reviewed and are negative.      Objective:   Physical Exam  Constitutional: She is oriented to person, place, and time. She appears well-developed and well-nourished.  HENT:  Nose: Nose normal.  Mouth/Throat: Oropharynx is clear and moist.  Eyes: EOM are normal.  Neck: Trachea normal, normal range of motion and full passive range of motion without pain. Neck supple. No JVD present. Carotid bruit is not present. No thyromegaly present.  Cardiovascular: Normal rate, regular rhythm, normal heart sounds and intact distal pulses.  Exam reveals no gallop and no friction rub.   No murmur heard. Pulmonary/Chest: Effort normal and breath sounds normal.  Abdominal: Soft. Bowel sounds are normal. She exhibits no distension and no mass. There is no tenderness.  Musculoskeletal: Normal range of motion. She exhibits edema (2+ edema right lower leg with mild erythema and warm to touch.).  Lymphadenopathy:    She has no cervical adenopathy.  Neurological: She is alert and oriented to person, place, and time. She has normal reflexes.  Skin: Skin is warm and dry.  Psychiatric: She has a normal mood and affect. Her behavior is normal. Judgment and thought content normal.   BP (!) 146/81   Pulse 91   Temp 97.8 F (36.6 C) (Oral)   Ht _0  (1.676 m)   Wt 234 lb  (106.1 kg)   BMI 37.77 kg/m         Assessment & Plan:  1. Essential hypertension, benign Low sodium diet Changed valsartan to losartan Keep check of blood pressures at home if possible - losartan (COZAAR) 100 MG tablet; Take 1 tablet (100 mg total) by mouth daily.  Dispense: 90 tablet; Refill: 3 - CMP14+EGFR  2. Hypothyroidism due to acquired atrophy of thyroid - Thyroid Panel With TSH  3. Mixed hyperlipidemia Low fat diet - atorvastatin (LIPITOR) 40 MG tablet; Take 1 tablet (40 mg total) by mouth daily.  Dispense: 90 tablet; Refill: 0 - Lipid panel  4. BMI 37.0-37.9, adult Discussed diet and exercise for person with BMI >25 Will recheck weight in 3-6 months  5. Osteoarthritis of lumbar spine, unspecified spinal osteoarthritis complication status - traMADol (ULTRAM) 50 MG tablet; Take 1 tablet (50 mg total) by mouth daily as needed.  Dispense: 30 tablet; Refill: 0 - traMADol (ULTRAM) 50 MG tablet; Take 1 tablet (50 mg total) by mouth daily.  Dispense: 30 tablet; Refill: 0 - traMADol (ULTRAM) 50 MG tablet; Take 1 tablet (50 mg total) by mouth daily.  Dispense: 30 tablet; Refill: 0 - meloxicam (MOBIC) 15 MG tablet; Take 1 tablet (15 mg total) by mouth daily.  Dispense: 30 tablet; Refill: 3  6. Peripheral edema elevate leg when sitting - furosemide (LASIX) 40 MG tablet; Take 1 tablet (40 mg total) by mouth daily.  Dispense: 30 tablet; Refill: 5  7. Cellulitis of right lower extremity RTO Friday for recheck Need to take lasix every day - Wound Dressings (TENDERWRAP [place]) [place]; Apply 1 each topically once.  Dispense: 1 each; Refill: 0 - ciprofloxacin (CIPRO) 500 MG tablet; Take 1 tablet (500 mg total) by mouth 2 (two) times daily.  Dispense: 20 tablet; Refill: 0   hemoccult cards given to patient with directions Labs pending Health maintenance reviewed Diet and exercise encouraged Continue all meds Follow up  In 3 months   [place], FNP

## 2017-01-13 LAB — THYROID PANEL WITH TSH
Free Thyroxine Index: 1.6 (ref 1.2–4.9)
T3 Uptake Ratio: 25 % (ref 24–39)
T4 TOTAL: 6.2 ug/dL (ref 4.5–12.0)
TSH: 3.51 u[IU]/mL (ref 0.450–4.500)

## 2017-01-13 LAB — LIPID PANEL
CHOL/HDL RATIO: 3.2 ratio (ref 0.0–4.4)
Cholesterol, Total: 126 mg/dL (ref 100–199)
HDL: 40 mg/dL (ref >39)
LDL CALC: 60 mg/dL (ref 0–99)
TRIGLYCERIDES: 131 mg/dL (ref 0–149)
VLDL CHOLESTEROL CAL: 26 mg/dL (ref 5–40)

## 2017-01-13 LAB — CMP14+EGFR
ALT: 25 IU/L (ref 0–32)
AST: 22 IU/L (ref 0–40)
Albumin/Globulin Ratio: 1.2 (ref 1.2–2.2)
Albumin: 3.9 g/dL (ref 3.6–4.8)
Alkaline Phosphatase: 125 IU/L — ABNORMAL HIGH (ref 39–117)
BUN/Creatinine Ratio: 13 (ref 12–28)
BUN: 14 mg/dL (ref 8–27)
Bilirubin Total: 0.7 mg/dL (ref 0.0–1.2)
CALCIUM: 9.3 mg/dL (ref 8.7–10.3)
CO2: 20 mmol/L (ref 20–29)
CREATININE: 1.09 mg/dL — AB (ref 0.57–1.00)
Chloride: 105 mmol/L (ref 96–106)
GFR calc non Af Amer: 52 mL/min/{1.73_m2} — ABNORMAL LOW (ref >59)
GFR, EST AFRICAN AMERICAN: 60 mL/min/{1.73_m2} (ref >59)
Globulin, Total: 3.3 g/dL (ref 1.5–4.5)
Glucose: 89 mg/dL (ref 65–99)
Potassium: 4.2 mmol/L (ref 3.5–5.2)
Sodium: 141 mmol/L (ref 134–144)
TOTAL PROTEIN: 7.2 g/dL (ref 6.0–8.5)

## 2017-01-15 ENCOUNTER — Ambulatory Visit (INDEPENDENT_AMBULATORY_CARE_PROVIDER_SITE_OTHER): Payer: Medicare Other | Admitting: Nurse Practitioner

## 2017-01-15 ENCOUNTER — Encounter: Payer: Self-pay | Admitting: Nurse Practitioner

## 2017-01-15 VITALS — BP 154/71 | HR 101 | Temp 97.4°F | Ht 66.0 in | Wt 236.0 lb

## 2017-01-15 DIAGNOSIS — L03115 Cellulitis of right lower limb: Secondary | ICD-10-CM | POA: Diagnosis not present

## 2017-01-15 MED ORDER — V-2 HIGH COMPRESSION HOSE MISC: 0 refills | Status: DC

## 2017-01-15 NOTE — Progress Notes (Signed)
   Subjective:    Patient ID: Kimberly Walsh, female    DOB: [birth year], 71 y.o.   MRN: [id number]  HPI Joann was seen on 01/12/17 for follow up of chronic medical problems. ON that day she was diagnosed with cellulitis and venous stasis of right loer leg- she was told to make sure she took lasix everyday and unna boot was applied. SHe was also given cipro to take. SHe is back today for follow up. SHe says she has been gong to the bathroom a lot. But her weight is actually up 2 lbs. She denies any lower ext pain. No SOB or chect pain.    Review of Systems  Constitutional: Negative.   Respiratory: Negative.   Cardiovascular: Negative.   Genitourinary: Negative.   Neurological: Negative.   Psychiatric/Behavioral: Negative.   All other systems reviewed and are negative.      Objective:   Physical Exam  Constitutional: She is oriented to person, place, and time. She appears well-developed and well-nourished. No distress.  Cardiovascular: Normal rate and intact distal pulses.   Pulmonary/Chest: Effort normal and breath sounds normal.  Musculoskeletal: She exhibits no edema (resolved edema right ower leg).  Neurological: She is alert and oriented to person, place, and time.  Skin: Skin is warm.  Mild purplish discoloration of anterior shin on right - cool to touch  Psychiatric: She has a normal mood and affect. Her behavior is normal. Judgment and thought content normal.   BP (!) 154/71   Pulse (!) 101   Temp (!) 97.4 F (36.3 C) (Oral)   Ht 5\' 6"  (1.676 m)   Wt 236 lb (107 kg)   BMI 38.09 kg/m       Assessment & Plan:  1. Cellulitis of right lower extremity Finish cipro as rx Continue lasix daily Wear knee high compression hose daily RTO prn - Elastic Bandages & Supports (V-2 HIGH COMPRESSION HOSE) MISC; wer daily when up  Dispense: 1 each; Refill: 0  Mary-Margaret Hassell Done, FNP

## 2017-01-15 NOTE — Patient Instructions (Signed)

## 2017-01-28 ENCOUNTER — Encounter: Payer: Medicare Other | Admitting: Nurse Practitioner

## 2017-02-10 ENCOUNTER — Other Ambulatory Visit: Payer: Self-pay | Admitting: Family Medicine

## 2017-02-12 ENCOUNTER — Encounter: Payer: Medicare Other | Admitting: Nurse Practitioner

## 2017-03-12 ENCOUNTER — Other Ambulatory Visit: Payer: Self-pay | Admitting: Nurse Practitioner

## 2017-04-03 DIAGNOSIS — Z23 Encounter for immunization: Secondary | ICD-10-CM | POA: Diagnosis not present

## 2017-04-12 ENCOUNTER — Other Ambulatory Visit: Payer: Self-pay | Admitting: Nurse Practitioner

## 2017-04-12 ENCOUNTER — Ambulatory Visit (INDEPENDENT_AMBULATORY_CARE_PROVIDER_SITE_OTHER): Payer: Medicare Other | Admitting: Nurse Practitioner

## 2017-04-12 ENCOUNTER — Encounter: Payer: Self-pay | Admitting: Nurse Practitioner

## 2017-04-12 DIAGNOSIS — R42 Dizziness and giddiness: Secondary | ICD-10-CM

## 2017-04-12 DIAGNOSIS — M47816 Spondylosis without myelopathy or radiculopathy, lumbar region: Secondary | ICD-10-CM | POA: Diagnosis not present

## 2017-04-12 MED ORDER — TRAMADOL HCL 50 MG PO TABS: 50.0000 mg | ORAL_TABLET | Freq: Every day | ORAL | 0 refills | Status: DC

## 2017-04-12 MED ORDER — TRAMADOL HCL 50 MG PO TABS: 50.0000 mg | ORAL_TABLET | Freq: Every day | ORAL | 0 refills | Status: DC | PRN

## 2017-04-12 MED ORDER — TRAMADOL HCL 50 MG PO TABS
50.0000 mg | ORAL_TABLET | Freq: Every day | ORAL | 0 refills | Status: DC
Start: 2017-04-12 — End: 2017-06-23

## 2017-04-12 NOTE — Patient Instructions (Signed)
Joint Pain Joint pain can be caused by many things. The joint can be bruised, infected, weak from aging, or sore from exercise. The pain will probably go away if you follow your doctor's instructions for home care. If your joint pain continues, more tests may be needed to help find the cause of your condition. Follow these instructions at home: Watch your condition for any changes. Follow these instructions as told to lessen the pain that you are feeling:  Take medicines only as told by your doctor.  Rest the sore joint for as long as told by your doctor. If your doctor tells you to, raise (elevate) the painful joint above the level of your heart while you are sitting or lying down.  Do not do things that cause pain or make the pain worse.  If told, put ice on the painful area: ? Put ice in a plastic bag. ? Place a towel between your skin and the bag. ? Leave the ice on for 20 minutes, 2-3 times per day.  Wear an elastic bandage, splint, or sling as told by your doctor. Loosen the bandage or splint if your fingers or toes lose feeling (become numb) and tingle, or if they turn cold and blue.  Begin exercising or stretching the joint as told by your doctor. Ask your doctor what types of exercise are safe for you.  Keep all follow-up visits as told by your doctor. This is important.  Contact a doctor if:  Your pain gets worse and medicine does not help it.  Your joint pain does not get better in 3 days.  You have more bruising or swelling.  You have a fever.  You lose 10 pounds (4.5 kg) or more without trying. Get help right away if:  You are not able to move the joint.  Your fingers or toes become numb or they turn cold and blue. This information is not intended to replace advice given to you by your health care provider. Make sure you discuss any questions you have with your health care provider. Document Released: 05/27/2009 Document Revised: 11/14/2015 Document Reviewed:  03/20/2014 Elsevier Interactive Patient Education  2018 [employer] Inc[employer]  

## 2017-04-12 NOTE — Progress Notes (Signed)
   Subjective:    Patient ID: Kimberly Walsh, female    DOB: 03/04/1946, 71 y.o.   MRN: [id number]  HPI Patient come sin today for pain management follow up.  Pain assessment: Cause of pain- osteoarthritis Pain location- hands and bil knees Pain on scale of 1-10- 2/10 currently- can get as high as 8/10 Frequency- daily What increases pain- p doing a lot of activities What makes pain Better- pain meds help Effects on ADL - still able to get adl's done Any change in general medical condition-none  Current medications- **ultram 50mg  1 a day Effectiveness of current meds-helps a lot - pain never goes below a 1/10 Adverse reactions form pain meds-none Morphine equivalent- <10  Pill count performed-No Urine drug screen- No Was the [place] reviewed- yes  If yes were their any concerning findings? - no suspicious activity  Pain contract signed on: 06/03/16     Review of Systems  Respiratory: Negative.   Cardiovascular: Negative.   Gastrointestinal: Negative.   Musculoskeletal: Negative.   Neurological: Negative.   Psychiatric/Behavioral: Negative.   All other systems reviewed and are negative.      Objective:   Physical Exam  Constitutional: She is oriented to person, place, and time. She appears well-developed and well-nourished. No distress.  Cardiovascular: Normal rate and regular rhythm.   Pulmonary/Chest: Effort normal and breath sounds normal.  Musculoskeletal:  Mild edema bil knees Pain with griping bil hands Edema of all pip joints bil hands  Neurological: She is alert and oriented to person, place, and time.  Skin: Skin is warm.  Psychiatric: She has a normal mood and affect. Her behavior is normal. Judgment and thought content normal.   BP (!) 144/78   Pulse 91   Temp (!) 97.4 F (36.3 C) (Oral)   Ht 5\' 6"  (1.676 m)   Wt 233 lb (105.7 kg)   BMI 37.61 kg/m     Assessment & Plan:  1. Osteoarthritis of lumbar spine, unspecified spinal osteoarthritis  complication status Moist heat or ice if helps joints rto in 3 months follow up - traMADol (ULTRAM) 50 MG tablet; Take 1 tablet (50 mg total) by mouth daily as needed.  Dispense: 30 tablet; Refill: 0 - traMADol (ULTRAM) 50 MG tablet; Take 1 tablet (50 mg total) by mouth daily.  Dispense: 30 tablet; Refill: 0 - traMADol (ULTRAM) 50 MG tablet; Take 1 tablet (50 mg total) by mouth daily.  Dispense: 30 tablet; Refill: 0  Mary-Margaret Hassell Done, FNP

## 2017-05-17 ENCOUNTER — Other Ambulatory Visit: Payer: Self-pay | Admitting: Nurse Practitioner

## 2017-05-20 NOTE — Progress Notes (Signed)
Husband aware per dpr.  

## 2017-06-23 ENCOUNTER — Ambulatory Visit (INDEPENDENT_AMBULATORY_CARE_PROVIDER_SITE_OTHER): Payer: Medicare Other | Admitting: Nurse Practitioner

## 2017-06-23 ENCOUNTER — Encounter: Payer: Self-pay | Admitting: Nurse Practitioner

## 2017-06-23 DIAGNOSIS — M47816 Spondylosis without myelopathy or radiculopathy, lumbar region: Secondary | ICD-10-CM

## 2017-06-23 MED ORDER — TRAMADOL HCL 50 MG PO TABS: 50.0000 mg | ORAL_TABLET | Freq: Every day | ORAL | 0 refills | Status: DC | PRN

## 2017-06-23 MED ORDER — TRAMADOL HCL 50 MG PO TABS: 50.0000 mg | ORAL_TABLET | Freq: Every day | ORAL | 0 refills | Status: DC

## 2017-06-23 NOTE — Patient Instructions (Signed)

## 2017-06-23 NOTE — Progress Notes (Signed)
   Subjective:    Patient ID: Kimberly Walsh, female    DOB: 09-19-1945, 72 y.o.   MRN: [id number]  HPI Patient in today for follow up of pain medication. Pain assessment: Cause of pain- osteoarhtritis Pain location- hands and knees Pain on scale of 1-10- 7/10 crrently Frequency- daily What increases pain-nothing really What makes pain Better- pain meds help relieve some pain but not all Effects on ADL - just moves slow in the morning Any change in general medical condition- none  Current medications- tramadol 50mg  1x daily Effectiveness of current meds-helps but does not completely relieve pain Adverse reactions form pain meds-none Morphine equivalent - 5  Pill count performed-No Urine drug screen- No Was the [place] reviewed- yes  If yes were their any concerning findings? - none  Pain contract signed on: 06/23/17     Review of Systems  Respiratory: Negative.   Cardiovascular: Negative.   Musculoskeletal: Positive for arthralgias (knees and hands).  All other systems reviewed and are negative.      Objective:   Physical Exam  Constitutional: She is oriented to person, place, and time. She appears well-developed and well-nourished. No distress.  Cardiovascular: Normal rate.  Pulmonary/Chest: Effort normal.  Musculoskeletal:  From of bil knees with crepitus bil All ligaments intact deformity and nodules  of PIP joints in all fingers  Neurological: She is alert and oriented to person, place, and time.  Skin: Skin is warm.  Psychiatric: She has a normal mood and affect. Her behavior is normal. Judgment and thought content normal.   BP (!) 148/73   Pulse 82   Temp (!) 97.5 F (36.4 C) (Oral)   Ht 5\' 6"  (1.676 m)   Wt 232 lb (105.2 kg)   BMI 37.45 kg/m       Assessment & Plan:  1. Osteoarthritis of lumbar spine, unspecified spinal osteoarthritis complication status Will follow up with ortho when ready for TKR - traMADol (ULTRAM) 50 MG tablet; Take 1 tablet  (50 mg total) by mouth daily as needed.  Dispense: 30 tablet; Refill: 0 - traMADol (ULTRAM) 50 MG tablet; Take 1 tablet (50 mg total) by mouth daily.  Dispense: 30 tablet; Refill: 0 - traMADol (ULTRAM) 50 MG tablet; Take 1 tablet (50 mg total) by mouth daily.  Dispense: 30 tablet; Refill: 0 Follow up in 3  Months  [place], FNP

## 2017-07-19 ENCOUNTER — Other Ambulatory Visit: Payer: Self-pay | Admitting: Nurse Practitioner

## 2017-07-28 ENCOUNTER — Ambulatory Visit (INDEPENDENT_AMBULATORY_CARE_PROVIDER_SITE_OTHER): Payer: Medicare Other

## 2017-07-28 ENCOUNTER — Encounter: Payer: Self-pay | Admitting: Family Medicine

## 2017-07-28 ENCOUNTER — Ambulatory Visit (INDEPENDENT_AMBULATORY_CARE_PROVIDER_SITE_OTHER): Payer: Medicare Other | Admitting: Family Medicine

## 2017-07-28 ENCOUNTER — Other Ambulatory Visit: Payer: Self-pay | Admitting: Nurse Practitioner

## 2017-07-28 VITALS — BP 133/79 | HR 88 | Temp 97.3°F | Ht 66.0 in | Wt 230.0 lb

## 2017-07-28 DIAGNOSIS — R300 Dysuria: Secondary | ICD-10-CM | POA: Diagnosis not present

## 2017-07-28 DIAGNOSIS — R0981 Nasal congestion: Secondary | ICD-10-CM | POA: Diagnosis not present

## 2017-07-28 DIAGNOSIS — N898 Other specified noninflammatory disorders of vagina: Secondary | ICD-10-CM | POA: Diagnosis not present

## 2017-07-28 DIAGNOSIS — Z780 Asymptomatic menopausal state: Secondary | ICD-10-CM

## 2017-07-28 DIAGNOSIS — N3000 Acute cystitis without hematuria: Secondary | ICD-10-CM

## 2017-07-28 DIAGNOSIS — Z13820 Encounter for screening for osteoporosis: Secondary | ICD-10-CM | POA: Diagnosis not present

## 2017-07-28 LAB — URINALYSIS, COMPLETE
BILIRUBIN UA: NEGATIVE
Glucose, UA: NEGATIVE
Ketones, UA: NEGATIVE
Nitrite, UA: NEGATIVE
SPEC GRAV UA: 1.02 (ref 1.005–1.030)
UUROB: 0.2 mg/dL (ref 0.2–1.0)
pH, UA: 6 (ref 5.0–7.5)

## 2017-07-28 LAB — WET PREP FOR TRICH, YEAST, CLUE
Clue Cell Exam: NEGATIVE
TRICHOMONAS EXAM: NEGATIVE
Yeast Exam: NEGATIVE

## 2017-07-28 LAB — MICROSCOPIC EXAMINATION

## 2017-07-28 MED ORDER — CEFDINIR 300 MG PO CAPS: 300.0000 mg | ORAL_CAPSULE | Freq: Two times a day (BID) | ORAL | 0 refills | Status: DC

## 2017-07-28 NOTE — Progress Notes (Signed)
BP 133/79   Pulse 88   Temp (!) 97.3 F (36.3 C) (Oral)   Ht 5\' 6"  (1.676 m)   Wt 230 lb (104.3 kg)   BMI 37.12 kg/m    Subjective:    Patient ID: Kimberly Walsh, female    DOB: 15-Sep-1945, 72 y.o.   MRN: [id number]  HPI: Kimberly Walsh is a 72 y.o. female presenting on 07/28/2017 for [place] in vaginal area, burning with urination and Sinusitis (nasal congestion, pressure, cough)   HPI Burning with urination and vaginal irritation Patient is coming in with vaginal irritation and a small amount of discharge with urination and urinary frequency.  She denies any abdominal pain or flank pain or fevers or chills.  She says that this is been going on over the past 4 days but is worsened over the past 2 days and that is why she called yesterday for the appointment.  She denies any blood in her urine that she is noticed.  Sinus congestion Patient has been having sinus congestion and pressure that is been going on for the past 4 days.  She has been having postnasal drainage and a cough and the pressure in her head is getting worse.  She does not use anything over-the-counter for it yet except just regular Tylenol.  She denies any fevers or chills or shortness of breath or wheezing.  She does get this sometimes around this time a year every year.  She denies any sick contacts that she knows of.  Relevant past medical, surgical, family and social history reviewed and updated as indicated. Interim medical history since our last visit reviewed. Allergies and medications reviewed and updated.  Review of Systems  Constitutional: Negative for chills and fever.  HENT: Positive for congestion, postnasal drip, rhinorrhea, sinus pressure, sneezing and sore throat. Negative for ear discharge and ear pain.   Eyes: Negative for pain, redness and visual disturbance.  Respiratory: Positive for cough. Negative for chest tightness and shortness of breath.   Cardiovascular: Negative for chest pain and leg  swelling.  Gastrointestinal: Negative for abdominal pain.  Genitourinary: Positive for dysuria, frequency, vaginal discharge and vaginal pain. Negative for decreased urine volume, difficulty urinating, flank pain and vaginal bleeding.  Musculoskeletal: Negative for back pain and gait problem.  Skin: Negative for rash.  Neurological: Negative for light-headedness and headaches.  Psychiatric/Behavioral: Negative for agitation and behavioral problems.  All other systems reviewed and are negative.   Per HPI unless specifically indicated above   Allergies as of 07/28/2017      Reactions   Ace Inhibitors Cough      Medication List        Accurate as of 07/28/17  8:41 AM. Always use your most recent med list.          atorvastatin 40 MG tablet Commonly known as:  LIPITOR Take 1 tablet (40 mg total) by mouth daily.   cyclobenzaprine 10 MG tablet Commonly known as:  FLEXERIL Take 1 Tablet by mouth 3 times a day as needed FOR MUSCLE SPASMS   furosemide 40 MG tablet Commonly known as:  LASIX Take 1 tablet (40 mg total) by mouth daily.   levothyroxine 25 MCG tablet Commonly known as:  SYNTHROID, LEVOTHROID Take 1 Tablet by mouth once daily as instructed   losartan 100 MG tablet Commonly known as:  COZAAR Take 1 tablet (100 mg total) by mouth daily.   meclizine 25 MG tablet Commonly known as:  ANTIVERT Take 1 Tablet  by mouth every 8 hours as needed FOR vertigo   meloxicam 15 MG tablet Commonly known as:  MOBIC Take 1 tablet (15 mg total) by mouth daily.   traMADol 50 MG tablet Commonly known as:  ULTRAM Take 1 tablet (50 mg total) by mouth daily as needed.   traMADol 50 MG tablet Commonly known as:  ULTRAM Take 1 tablet (50 mg total) by mouth daily.   traMADol 50 MG tablet Commonly known as:  ULTRAM Take 1 tablet (50 mg total) by mouth daily.   V-2 HIGH COMPRESSION HOSE Misc wer daily when up          Objective:    BP 133/79   Pulse 88   Temp (!) 97.3 F  (36.3 C) (Oral)   Ht 5\' 6"  (1.676 m)   Wt 230 lb (104.3 kg)   BMI 37.12 kg/m   Wt Readings from Last 3 Encounters:  07/28/17 230 lb (104.3 kg)  06/23/17 232 lb (105.2 kg)  04/12/17 233 lb (105.7 kg)    Physical Exam  Constitutional: She is oriented to person, place, and time. She appears well-developed and well-nourished. No distress.  HENT:  Right Ear: Tympanic membrane, external ear and ear canal normal.  Left Ear: Tympanic membrane, external ear and ear canal normal.  Nose: Mucosal edema and rhinorrhea present. No epistaxis. Right sinus exhibits no maxillary sinus tenderness and no frontal sinus tenderness. Left sinus exhibits no maxillary sinus tenderness and no frontal sinus tenderness.  Mouth/Throat: Uvula is midline and mucous membranes are normal. Posterior oropharyngeal edema and posterior oropharyngeal erythema present. No oropharyngeal exudate or tonsillar abscesses.  Eyes: Conjunctivae are normal.  Neck: Neck supple. No thyromegaly present.  Cardiovascular: Normal rate, regular rhythm, normal heart sounds and intact distal pulses.  No murmur heard. Pulmonary/Chest: Effort normal and breath sounds normal. No respiratory distress. She has no wheezes. She has no rales.  Abdominal: Soft. Bowel sounds are normal. She exhibits no distension. There is no tenderness. There is no rebound.  Genitourinary: There is rash (Dermatitis with clear discharge from vagina and near the vaginal wall opening.) on the right labia. There is no tenderness on the right labia. There is rash on the left labia. There is no tenderness on the left labia.  Musculoskeletal: Normal range of motion. She exhibits no edema or tenderness.  Lymphadenopathy:    She has no cervical adenopathy.  Neurological: She is alert and oriented to person, place, and time. Coordination normal.  Skin: Skin is warm and dry. No rash noted. She is not diaphoretic.  Psychiatric: She has a normal mood and affect. Her behavior is  normal.  Nursing note and vitals reviewed.  Wet prep: Negative wet prep  Urinalysis: 6-10 WBCs, 3-10 RBCs, 0-10 epithelial cells, few bacteria    Assessment & Plan:   Problem List Items Addressed This Visit    None    Visit Diagnoses    Acute cystitis without hematuria    -  Primary   Relevant Medications   cefdinir (OMNICEF) 300 MG capsule   Other Relevant Orders   Urinalysis, Complete   Vaginal discharge       Relevant Orders   WET PREP FOR [place]ICH, YEAST, CLUE   Sinus congestion       Relevant Medications   cefdinir (OMNICEF) 300 MG capsule       Follow up plan: Return if symptoms worsen or fail to improve.  Counseling provided for all of the vaccine components Orders Placed This  Encounter  Procedures  . WET PREP FOR [place], YEAST, CLUE  . Urinalysis, Complete    Caryl Pina, MD [facility] 07/28/2017, 8:41 AM

## 2017-07-30 ENCOUNTER — Telehealth: Payer: Self-pay | Admitting: Nurse Practitioner

## 2017-07-30 MED ORDER — AMOXICILLIN-POT CLAVULANATE 875-125 MG PO TABS: 1.0000 | ORAL_TABLET | Freq: Two times a day (BID) | ORAL | 0 refills | Status: DC

## 2017-07-30 NOTE — Telephone Encounter (Signed)
Augmentin Prescription sent to pharmacy. Take with food.

## 2017-07-30 NOTE — Telephone Encounter (Signed)
Pt was seen 07/28/17 Pt cannot take cefdinir (OMNICEF) 300 MG capsule. Its makes her dizzy and she vomited after taking it. Can something else be sent to [facility]

## 2017-07-30 NOTE — Telephone Encounter (Signed)
Pt aware.

## 2017-07-30 NOTE — Telephone Encounter (Signed)
Covering for [place]

## 2017-08-14 ENCOUNTER — Other Ambulatory Visit: Payer: Self-pay | Admitting: Nurse Practitioner

## 2017-08-14 DIAGNOSIS — E782 Mixed hyperlipidemia: Secondary | ICD-10-CM

## 2017-08-16 NOTE — Telephone Encounter (Signed)
Next Ov 09/15/17 

## 2017-09-03 ENCOUNTER — Other Ambulatory Visit: Payer: Self-pay | Admitting: Nurse Practitioner

## 2017-09-15 ENCOUNTER — Encounter: Payer: Self-pay | Admitting: Nurse Practitioner

## 2017-09-15 ENCOUNTER — Ambulatory Visit (INDEPENDENT_AMBULATORY_CARE_PROVIDER_SITE_OTHER): Payer: Medicare Other | Admitting: Nurse Practitioner

## 2017-09-15 VITALS — BP 148/80 | HR 72 | Temp 97.3°F | Ht 66.0 in | Wt 231.0 lb

## 2017-09-15 DIAGNOSIS — E782 Mixed hyperlipidemia: Secondary | ICD-10-CM

## 2017-09-15 DIAGNOSIS — R609 Edema, unspecified: Secondary | ICD-10-CM

## 2017-09-15 DIAGNOSIS — I10 Essential (primary) hypertension: Secondary | ICD-10-CM | POA: Diagnosis not present

## 2017-09-15 DIAGNOSIS — E034 Atrophy of thyroid (acquired): Secondary | ICD-10-CM | POA: Diagnosis not present

## 2017-09-15 DIAGNOSIS — Z6837 Body mass index (BMI) 37.0-37.9, adult: Secondary | ICD-10-CM | POA: Diagnosis not present

## 2017-09-15 DIAGNOSIS — M47816 Spondylosis without myelopathy or radiculopathy, lumbar region: Secondary | ICD-10-CM | POA: Diagnosis not present

## 2017-09-15 MED ORDER — LOSARTAN POTASSIUM 100 MG PO TABS: 100.0000 mg | ORAL_TABLET | Freq: Every day | ORAL | 3 refills | Status: DC

## 2017-09-15 MED ORDER — FUROSEMIDE 40 MG PO TABS: 40.0000 mg | ORAL_TABLET | Freq: Every day | ORAL | 5 refills | Status: DC

## 2017-09-15 MED ORDER — LEVOTHYROXINE SODIUM 25 MCG PO TABS: ORAL_TABLET | ORAL | 8 refills | Status: DC

## 2017-09-15 MED ORDER — TRAMADOL HCL 50 MG PO TABS: 50.0000 mg | ORAL_TABLET | Freq: Every day | ORAL | 0 refills | Status: DC | PRN

## 2017-09-15 MED ORDER — TRAMADOL HCL 50 MG PO TABS: 50.0000 mg | ORAL_TABLET | Freq: Every day | ORAL | 0 refills | Status: DC

## 2017-09-15 NOTE — Progress Notes (Addendum)
 Subjective:    Patient ID: Kimberly Walsh, female    DOB: 09/08/1945, 72 y.o.   MRN: [id number]  HPI  Kimberly Walsh is here today for follow up of chronic medical problem.  Outpatient Encounter Medications as of 09/15/2017  Medication Sig  . atorvastatin (LIPITOR) 40 MG tablet TAKE 1 TABLET BY MOUTH ONCE DAILY  . cyclobenzaprine (FLEXERIL) 10 MG tablet Take 1 Tablet by mouth 3 times a day as needed FOR MUSCLE SPASMS  . Elastic Bandages & Supports (V-2 HIGH COMPRESSION HOSE) MISC wer daily when up  . furosemide (LASIX) 40 MG tablet Take 1 tablet (40 mg total) by mouth daily.  . levothyroxine (SYNTHROID, LEVOTHROID) 25 MCG tablet Take 1 Tablet by mouth once daily as instructed  . losartan (COZAAR) 100 MG tablet Take 1 tablet (100 mg total) by mouth daily.  . meclizine (ANTIVERT) 25 MG tablet Take 1 Tablet by mouth every 8 hours as needed FOR vertigo  . meloxicam (MOBIC) 15 MG tablet Take 1 tablet (15 mg total) by mouth daily.  . traMADol (ULTRAM) 50 MG tablet Take 1 tablet (50 mg total) by mouth daily as needed.  . traMADol (ULTRAM) 50 MG tablet Take 1 tablet (50 mg total) by mouth daily.  . traMADol (ULTRAM) 50 MG tablet Take 1 tablet (50 mg total) by mouth daily.     1. Essential hypertension, benign  No c/o chest pain, sob or headache. Does not check blood pressure at home. BP Readings from Last 3 Encounters:  07/28/17 133/79  06/23/17 (!) 148/73  04/12/17 (!) 144/78     2. Hypothyroidism due to acquired atrophy of thyroid  No problems that she is aware of  3. Osteoarthritis of lumbar spine, unspecified spinal osteoarthritis complication status  hand sand knees- Is on tramadol for pain  4. BMI 37.0-37.9, adult  No recent weight changes  5. Mixed hyperlipidemia  Not watching diet  6.      Peripheral edema          Only on right leg- will resolve at night but will quickly return during day. Wears          compression hose often.  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  HENT: Negative.   Eyes: Negative for pain.  Respiratory: Negative for shortness of breath.   Cardiovascular: Negative for chest pain, palpitations and leg swelling.  Gastrointestinal: Negative for abdominal pain.  Endocrine: Negative for polydipsia.  Genitourinary: Negative.   Skin: Negative for rash.  Neurological: Negative for dizziness, weakness and headaches.  Hematological: Does not bruise/bleed easily.  Psychiatric/Behavioral: Negative.   All other systems reviewed and are negative.      Objective:   Physical Exam  Constitutional: She is oriented to person, place, and time. She appears well-developed and well-nourished.  HENT:  Nose: Nose normal.  Mouth/Throat: Oropharynx is clear and moist.  Eyes: EOM are normal.  Neck: Trachea normal, normal range of motion and full passive range of motion without pain. Neck supple. No JVD present. Carotid bruit is not present. No thyromegaly present.  Cardiovascular: Normal rate, regular rhythm, normal heart sounds and intact distal pulses. Exam reveals no gallop and no friction rub.  No murmur heard. Pulmonary/Chest: Effort normal and breath sounds normal.  Abdominal: Soft. Bowel sounds are normal. She exhibits no distension and no   mass. There is no tenderness.  Musculoskeletal: Normal range of motion. She exhibits edema (2+ pitting edema right lower leg).  Lymphadenopathy:    She has no cervical adenopathy.  Neurological: She is alert and oriented to person, place, and time. She has normal reflexes.  Skin: Skin is warm and dry.  Psychiatric: She has a normal mood and affect. Her behavior is normal. Judgment and thought content normal.   BP (!) 148/80 (BP Location: Left Arm, Cuff Size: Large)   Pulse 72   Temp (!) 97.3 F (36.3 C) (Oral)   Ht 5' 6" (1.676 m)   Wt 231 lb (104.8 kg)    SpO2 97%   BMI 37.28 kg/m         Assessment & Plan:  1. Essential hypertension, benign Low sodium diet Keep diary of blood pressure at home - CMP14+EGFR - losartan (COZAAR) 100 MG tablet; Take 1 tablet (100 mg total) by mouth daily.  Dispense: 90 tablet; Refill: 3  2. Hypothyroidism due to acquired atrophy of thyroid - Thyroid Panel With TSH - levothyroxine (SYNTHROID, LEVOTHROID) 25 MCG tablet; Take 1 Tablet by mouth once daily as instructed  Dispense: 30 tablet; Refill: 8  3. Osteoarthritis of lumbar spine, unspecified spinal osteoarthritis complication status - traMADol (ULTRAM) 50 MG tablet; Take 1 tablet (50 mg total) by mouth daily as needed.  Dispense: 30 tablet; Refill: 0 - traMADol (ULTRAM) 50 MG tablet; Take 1 tablet (50 mg total) by mouth daily.  Dispense: 30 tablet; Refill: 0 - traMADol (ULTRAM) 50 MG tablet; Take 1 tablet (50 mg total) by mouth daily.  Dispense: 30 tablet; Refill: 0  4. BMI 37.0-37.9, adult Discussed diet and exercise for person with BMI >25 Will recheck weight in 3-6 months  5. Mixed hyperlipidemia Low fat diet - Lipid panel  6. Peripheral edema elevate leg when sitting Wear compression hose as often as possible - furosemide (LASIX) 40 MG tablet; Take 1 tablet (40 mg total) by mouth daily.  Dispense: 30 tablet; Refill: 5    Labs pending Health maintenance reviewed Diet and exercise encouraged Continue all meds Follow up  In 3 month   [place], FNP

## 2017-09-15 NOTE — Patient Instructions (Signed)
Edema Edema is when you have too much fluid in your body or under your skin. Edema may make your legs, feet, and ankles swell up. Swelling is also common in looser tissues, like around your eyes. This is a common condition. It gets more common as you get older. There are many possible causes of edema. Eating too much salt (sodium) and being on your feet or sitting for a long time can cause edema in your legs, feet, and ankles. Hot weather may make edema worse. Edema is usually painless. Your skin may look swollen or shiny. Follow these instructions at home:  Keep the swollen body part raised (elevated) above the level of your heart when you are sitting or lying down.  Do not sit still or stand for a long time.  Do not wear tight clothes. Do not wear garters on your upper legs.  Exercise your legs. This can help the swelling go down.  Wear elastic bandages or support stockings as told by your doctor.  Eat a low-salt (low-sodium) diet to reduce fluid as told by your doctor.  Depending on the cause of your swelling, you may need to limit how much fluid you drink (fluid restriction).  Take over-the-counter and prescription medicines only as told by your doctor. Contact a doctor if:  Treatment is not working.  You have heart, liver, or kidney disease and have symptoms of edema.  You have sudden and unexplained weight gain. Get help right away if:  You have shortness of breath or chest pain.  You cannot breathe when you lie down.  You have pain, redness, or warmth in the swollen areas.  You have heart, liver, or kidney disease and get edema all of a sudden.  You have a fever and your symptoms get worse all of a sudden. Summary  Edema is when you have too much fluid in your body or under your skin.  Edema may make your legs, feet, and ankles swell up. Swelling is also common in looser tissues, like around your eyes.  Raise (elevate) the swollen body part above the level of your  heart when you are sitting or lying down.  Follow your doctor's instructions about diet and how much fluid you can drink (fluid restriction). This information is not intended to replace advice given to you by your health care provider. Make sure you discuss any questions you have with your health care provider. Document Released: 11/25/2007 Document Revised: 06/26/2016 Document Reviewed: 06/26/2016 Elsevier Interactive Patient Education  2017 Else[employer] Inc.  

## 2017-09-16 ENCOUNTER — Other Ambulatory Visit: Payer: Self-pay | Admitting: Nurse Practitioner

## 2017-09-16 LAB — CMP14+EGFR
ALBUMIN: 4.1 g/dL (ref 3.5–4.8)
ALK PHOS: 119 IU/L — AB (ref 39–117)
ALT: 21 IU/L (ref 0–32)
AST: 21 IU/L (ref 0–40)
Albumin/Globulin Ratio: 1.4 (ref 1.2–2.2)
BUN / CREAT RATIO: 11 — AB (ref 12–28)
BUN: 14 mg/dL (ref 8–27)
Bilirubin Total: 0.6 mg/dL (ref 0.0–1.2)
CO2: 22 mmol/L (ref 20–29)
CREATININE: 1.24 mg/dL — AB (ref 0.57–1.00)
Calcium: 9.4 mg/dL (ref 8.7–10.3)
Chloride: 103 mmol/L (ref 96–106)
GFR, EST AFRICAN AMERICAN: 51 mL/min/{1.73_m2} — AB (ref >59)
GFR, EST NON AFRICAN AMERICAN: 44 mL/min/{1.73_m2} — AB (ref >59)
GLOBULIN, TOTAL: 3 g/dL (ref 1.5–4.5)
GLUCOSE: 88 mg/dL (ref 65–99)
Potassium: 4.7 mmol/L (ref 3.5–5.2)
SODIUM: 141 mmol/L (ref 134–144)
TOTAL PROTEIN: 7.1 g/dL (ref 6.0–8.5)

## 2017-09-16 LAB — LIPID PANEL
CHOL/HDL RATIO: 4.2 ratio (ref 0.0–4.4)
Cholesterol, Total: 173 mg/dL (ref 100–199)
HDL: 41 mg/dL (ref >39)
LDL CALC: 109 mg/dL — AB (ref 0–99)
Triglycerides: 116 mg/dL (ref 0–149)
VLDL CHOLESTEROL CAL: 23 mg/dL (ref 5–40)

## 2017-09-16 LAB — THYROID PANEL WITH TSH
Free Thyroxine Index: 1.4 (ref 1.2–4.9)
T3 UPTAKE RATIO: 25 % (ref 24–39)
T4 TOTAL: 5.7 ug/dL (ref 4.5–12.0)
TSH: 6.47 u[IU]/mL — AB (ref 0.450–4.500)

## 2017-09-16 MED ORDER — LEVOTHYROXINE SODIUM 50 MCG PO TABS: 50.0000 ug | ORAL_TABLET | Freq: Every day | ORAL | 11 refills | Status: DC

## 2017-10-04 ENCOUNTER — Other Ambulatory Visit: Payer: Self-pay | Admitting: Nurse Practitioner

## 2017-10-14 ENCOUNTER — Telehealth: Payer: Self-pay | Admitting: Nurse Practitioner

## 2017-10-14 ENCOUNTER — Other Ambulatory Visit: Payer: Self-pay | Admitting: Nurse Practitioner

## 2017-10-14 NOTE — Telephone Encounter (Signed)
Patient last filled on 09/04/2017 and pharmacy corrected rx.

## 2017-10-14 NOTE — Telephone Encounter (Signed)
Please find out when filled last

## 2017-10-15 ENCOUNTER — Other Ambulatory Visit: Payer: Self-pay | Admitting: Nurse Practitioner

## 2017-10-15 DIAGNOSIS — M47816 Spondylosis without myelopathy or radiculopathy, lumbar region: Secondary | ICD-10-CM

## 2017-10-15 MED ORDER — TRAMADOL HCL 50 MG PO TABS: 50.0000 mg | ORAL_TABLET | Freq: Every day | ORAL | 0 refills | Status: DC

## 2017-10-28 DIAGNOSIS — M1711 Unilateral primary osteoarthritis, right knee: Secondary | ICD-10-CM | POA: Diagnosis not present

## 2017-10-28 DIAGNOSIS — M25561 Pain in right knee: Secondary | ICD-10-CM | POA: Insufficient documentation

## 2017-10-28 DIAGNOSIS — M545 Low back pain: Secondary | ICD-10-CM | POA: Diagnosis not present

## 2017-10-28 DIAGNOSIS — M1712 Unilateral primary osteoarthritis, left knee: Secondary | ICD-10-CM | POA: Diagnosis not present

## 2017-12-05 DIAGNOSIS — R42 Dizziness and giddiness: Secondary | ICD-10-CM | POA: Diagnosis not present

## 2017-12-09 ENCOUNTER — Other Ambulatory Visit: Payer: Self-pay | Admitting: Nurse Practitioner

## 2017-12-09 NOTE — Telephone Encounter (Signed)
Last seen 09/15/17  MMM

## 2017-12-16 ENCOUNTER — Ambulatory Visit (INDEPENDENT_AMBULATORY_CARE_PROVIDER_SITE_OTHER): Payer: Medicare Other | Admitting: Nurse Practitioner

## 2017-12-16 ENCOUNTER — Encounter: Payer: Self-pay | Admitting: Nurse Practitioner

## 2017-12-16 VITALS — BP 141/77 | HR 76 | Temp 97.0°F | Ht 66.0 in | Wt 224.0 lb

## 2017-12-16 DIAGNOSIS — R609 Edema, unspecified: Secondary | ICD-10-CM

## 2017-12-16 DIAGNOSIS — E034 Atrophy of thyroid (acquired): Secondary | ICD-10-CM

## 2017-12-16 DIAGNOSIS — E782 Mixed hyperlipidemia: Secondary | ICD-10-CM | POA: Diagnosis not present

## 2017-12-16 DIAGNOSIS — Z1211 Encounter for screening for malignant neoplasm of colon: Secondary | ICD-10-CM

## 2017-12-16 DIAGNOSIS — N950 Postmenopausal bleeding: Secondary | ICD-10-CM | POA: Diagnosis not present

## 2017-12-16 DIAGNOSIS — Z6837 Body mass index (BMI) 37.0-37.9, adult: Secondary | ICD-10-CM

## 2017-12-16 DIAGNOSIS — M47816 Spondylosis without myelopathy or radiculopathy, lumbar region: Secondary | ICD-10-CM | POA: Diagnosis not present

## 2017-12-16 DIAGNOSIS — I10 Essential (primary) hypertension: Secondary | ICD-10-CM | POA: Diagnosis not present

## 2017-12-16 DIAGNOSIS — Z1212 Encounter for screening for malignant neoplasm of rectum: Secondary | ICD-10-CM | POA: Diagnosis not present

## 2017-12-16 MED ORDER — CYCLOBENZAPRINE HCL 10 MG PO TABS: ORAL_TABLET | ORAL | 0 refills | Status: DC

## 2017-12-16 MED ORDER — TRAMADOL HCL 50 MG PO TABS: 50.0000 mg | ORAL_TABLET | Freq: Every day | ORAL | 0 refills | Status: AC

## 2017-12-16 MED ORDER — TRAMADOL HCL 50 MG PO TABS: 50.0000 mg | ORAL_TABLET | Freq: Every day | ORAL | 0 refills | Status: DC | PRN

## 2017-12-16 MED ORDER — LOSARTAN POTASSIUM 100 MG PO TABS: 100.0000 mg | ORAL_TABLET | Freq: Every day | ORAL | 3 refills | Status: DC

## 2017-12-16 MED ORDER — LEVOTHYROXINE SODIUM 50 MCG PO TABS: 50.0000 ug | ORAL_TABLET | Freq: Every day | ORAL | 11 refills | Status: DC

## 2017-12-16 MED ORDER — FUROSEMIDE 40 MG PO TABS: 40.0000 mg | ORAL_TABLET | Freq: Every day | ORAL | 5 refills | Status: DC

## 2017-12-16 MED ORDER — ATORVASTATIN CALCIUM 40 MG PO TABS: 40.0000 mg | ORAL_TABLET | Freq: Every day | ORAL | 1 refills | Status: DC

## 2017-12-16 NOTE — Patient Instructions (Signed)
Postmenopausal Bleeding Postmenopausal bleeding is any bleeding after menopause. Menopause is when a woman's period stops. Any type of bleeding after menopause is concerning. It should be checked by your doctor. Any treatment will depend on the cause. Follow these instructions at home: Watch your condition for any changes.  Avoid the use of tampons and douches as told by your doctor.  Change your pads often.  Get regular pelvic exams and Pap tests.  Keep all appointments for tests as told by your doctor.  Contact a doctor if:  Your bleeding lasts for more than 1 week.  You have belly (abdominal) pain.  You have bleeding after sex (intercourse). Get help right away if:  You have a fever, chills, a headache, dizziness, muscle aches, and bleeding.  You have strong pain with bleeding.  You have clumps of blood (blood clots) coming from your vagina.  You have bleeding and need more than 1 pad an hour.  You feel like you are going to pass out (faint). This information is not intended to replace advice given to you by your health care provider. Make sure you discuss any questions you have with your health care provider. Document Released: 03/17/2008 Document Revised: 11/14/2015 Document Reviewed: 01/05/2013 Elsevier Interactive Patient Education  2017 Elsevier Inc.  

## 2017-12-16 NOTE — Progress Notes (Signed)
Subjective:    Patient ID: Kimberly Walsh, female    DOB: 06-Dec-1945, 72 y.o.   MRN: [id number]   Chief Complaint: Medical Management of Chronic Issues (vaginal spotting and place on face)   HPI:  1. Essential hypertension, benign  No c/o chest pain, sob or headache. Does not check blood pressure at home.  BP Readings from Last 3 Encounters:  09/15/17 (!) 148/80  07/28/17 133/79  06/23/17 (!) 148/73     2. Hypothyroidism due to acquired atrophy of thyroid  No problems that she is aware of  3. Peripheral edema  Has swelling daily at end of day. Worse when she is on her feet a lot.  4. Mixed hyperlipidemia  Tries to avoid fatty foods  5. BMI 37.0-37.9, adult  No recent weight changs  6. Osteoarthritis of lumbar spine, unspecified spinal osteoarthritis complication status Pain assessment: Cause of pain- osteoarthritis Pain location- knees and back Pain on scale of 1-10- 2/10 currently Frequency- daily What increases pain-lots of movement What makes pain Better-ultram helps Effects on ADL - still is able to do what she needs to do Any change in general medical condition-none  Current medications- ultram Effectiveness of current meds-helps a lot but never completely pain free Adverse reactions form pain meds-none Morphine equivalent 10  Pill count performed-No Urine drug screen- No Was the [place] reviewed- yes  If yes were their any concerning findings? - no  Pain contract signed on:      Outpatient Encounter Medications as of 12/16/2017  Medication Sig  . atorvastatin (LIPITOR) 40 MG tablet TAKE 1 TABLET BY MOUTH ONCE DAILY  . furosemide (LASIX) 40 MG tablet Take 1 tablet (40 mg total) by mouth daily.  Marland Kitchen levothyroxine (SYNTHROID) 50 MCG tablet Take 1 tablet (50 mcg total) by mouth daily.  Marland Kitchen losartan (COZAAR) 100 MG tablet Take 1 tablet (100 mg total) by mouth daily.  . meclizine (ANTIVERT) 25 MG tablet Take 1 Tablet by mouth every 8 hours as needed FOR vertigo    . meloxicam (MOBIC) 15 MG tablet Take 1 tablet (15 mg total) by mouth daily.  . traMADol (ULTRAM) 50 MG tablet Take 1 tablet (50 mg total) by mouth daily as needed.  . traMADol (ULTRAM) 50 MG tablet Take 1 tablet (50 mg total) by mouth daily.  . traMADol (ULTRAM) 50 MG tablet Take 1 tablet (50 mg total) by mouth daily.  . cyclobenzaprine (FLEXERIL) 10 MG tablet Take 1 Tablet by mouth 3 times a day as needed FOR MUSCLE SPASMS (Patient not taking: Reported on 12/16/2017)     New complaints: Has been spotting lately. She has not had a period since 200. She spotted for over a week. Was bright red.  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   Review of Systems  Constitutional: Negative for activity change and appetite change.  HENT: Negative.   Eyes: Negative for pain.  Respiratory: Negative for shortness of breath.   Cardiovascular: Negative for chest pain, palpitations and leg swelling.  Gastrointestinal: Negative for abdominal pain.  Endocrine: Negative for polydipsia.  Genitourinary: Negative.   Musculoskeletal: Positive for arthralgias and back pain.  Skin: Negative for rash.  Neurological: Positive for dizziness (on occasion.). Negative for weakness and headaches.  Hematological: Does not bruise/bleed easily.  Psychiatric/Behavioral: Negative.   All other systems reviewed and are negative.  Objective:   Physical Exam  Constitutional: She is oriented to person, place, and time. She appears well-developed and well-nourished.  HENT:  Head: Normocephalic.  Nose: Nose normal.  Mouth/Throat: Oropharynx is clear and moist.  Eyes: Pupils are equal, round, and reactive to light. EOM are normal.  Neck: Normal range of motion. Neck supple. No JVD present. Carotid bruit is not present.  Cardiovascular: Normal rate, regular rhythm, normal heart sounds and intact distal  pulses.  Pulmonary/Chest: Effort normal and breath sounds normal. No respiratory distress. She has no wheezes. She has no rales. She exhibits no tenderness.  Abdominal: Soft. Normal appearance, normal aorta and bowel sounds are normal. She exhibits no distension, no abdominal bruit, no pulsatile midline mass and no mass. There is no splenomegaly or hepatomegaly. There is no tenderness.  Musculoskeletal: Normal range of motion. She exhibits edema (2+ edema right lower leg and 1= EDEMA LEFT LOWER LEG).  Lymphadenopathy:    She has no cervical adenopathy.  Neurological: She is alert and oriented to person, place, and time. She has normal reflexes.  Skin: Skin is warm and dry.  Psychiatric: She has a normal mood and affect. Her behavior is normal. Judgment and thought content normal.  Nursing note and vitals reviewed.  BP (!) 141/77   Pulse 76   Temp (!) 97 F (36.1 C) (Oral)   Ht _0  (1.676 m)   Wt 224 lb (101.6 kg)   BMI 36.15 kg/m       Assessment & Plan:  Kimberly Walsh comes in today with chief complaint of Medical Management of Chronic Issues (vaginal spotting and place on face)   Diagnosis and orders addressed:  1. Essential hypertension, benign Low sodium diet - losartan (COZAAR) 100 MG tablet; Take 1 tablet (100 mg total) by mouth daily.  Dispense: 90 tablet; Refill: 3 - CMP14+EGFR  2. Hypothyroidism due to acquired atrophy of thyroid - levothyroxine (SYNTHROID) 50 MCG tablet; Take 1 tablet (50 mcg total) by mouth daily.  Dispense: 30 tablet; Refill: 11 - Thyroid Panel With TSH  3. Peripheral edema Elevate legs when sitting Wear wrap on left leg - furosemide (LASIX) 40 MG tablet; Take 1 tablet (40 mg total) by mouth daily.  Dispense: 30 tablet; Refill: 5  4. Mixed hyperlipidemia Low fat diet - atorvastatin (LIPITOR) 40 MG tablet; Take 1 tablet (40 mg total) by mouth daily.  Dispense: 90 tablet; Refill: 1 - Lipid panel  5. BMI 37.0-37.9, adult Discussed diet and  exercise for person with BMI >25 Will recheck weight in 3-6 months  6. Osteoarthritis of lumbar spine, unspecified spinal osteoarthritis complication status follo wup in 3 months - traMADol (ULTRAM) 50 MG tablet; Take 1 tablet (50 mg total) by mouth daily.  Dispense: 30 tablet; Refill: 0 - traMADol (ULTRAM) 50 MG tablet; Take 1 tablet (50 mg total) by mouth daily as needed.  Dispense: 30 tablet; Refill: 0 - traMADol (ULTRAM) 50 MG tablet; Take 1 tablet (50 mg total) by mouth daily.  Dispense: 30 tablet; Refill: 0  7. Post-menopausal bleeding Will need to schedule endometrial bx   Labs pending Health Maintenance reviewed Diet and exercise encouraged  Follow up plan: 3 months   [place], FNP

## 2017-12-17 LAB — CMP14+EGFR
A/G RATIO: 1.3 (ref 1.2–2.2)
ALBUMIN: 4 g/dL (ref 3.5–4.8)
ALK PHOS: 115 IU/L (ref 39–117)
ALT: 23 IU/L (ref 0–32)
AST: 23 IU/L (ref 0–40)
BUN / CREAT RATIO: 15 (ref 12–28)
BUN: 21 mg/dL (ref 8–27)
Bilirubin Total: 0.4 mg/dL (ref 0.0–1.2)
CALCIUM: 9.6 mg/dL (ref 8.7–10.3)
CO2: 22 mmol/L (ref 20–29)
CREATININE: 1.42 mg/dL — AB (ref 0.57–1.00)
Chloride: 106 mmol/L (ref 96–106)
GFR calc Af Amer: 43 mL/min/{1.73_m2} — ABNORMAL LOW (ref >59)
GFR, EST NON AFRICAN AMERICAN: 37 mL/min/{1.73_m2} — AB (ref >59)
GLOBULIN, TOTAL: 3.2 g/dL (ref 1.5–4.5)
Glucose: 87 mg/dL (ref 65–99)
POTASSIUM: 4.2 mmol/L (ref 3.5–5.2)
SODIUM: 142 mmol/L (ref 134–144)
Total Protein: 7.2 g/dL (ref 6.0–8.5)

## 2017-12-17 LAB — THYROID PANEL WITH TSH
FREE THYROXINE INDEX: 1.4 (ref 1.2–4.9)
T3 Uptake Ratio: 24 % (ref 24–39)
T4 TOTAL: 5.8 ug/dL (ref 4.5–12.0)
TSH: 3.98 u[IU]/mL (ref 0.450–4.500)

## 2017-12-17 LAB — LIPID PANEL
CHOL/HDL RATIO: 4.7 ratio — AB (ref 0.0–4.4)
Cholesterol, Total: 170 mg/dL (ref 100–199)
HDL: 36 mg/dL — ABNORMAL LOW (ref >39)
LDL CALC: 95 mg/dL (ref 0–99)
Triglycerides: 194 mg/dL — ABNORMAL HIGH (ref 0–149)
VLDL Cholesterol Cal: 39 mg/dL (ref 5–40)

## 2017-12-31 ENCOUNTER — Ambulatory Visit (INDEPENDENT_AMBULATORY_CARE_PROVIDER_SITE_OTHER): Payer: Medicare Other | Admitting: Nurse Practitioner

## 2017-12-31 ENCOUNTER — Encounter: Payer: Self-pay | Admitting: Nurse Practitioner

## 2017-12-31 ENCOUNTER — Other Ambulatory Visit: Payer: Self-pay | Admitting: Nurse Practitioner

## 2017-12-31 VITALS — BP 154/75 | HR 74 | Temp 97.0°F | Ht 66.0 in | Wt 227.0 lb

## 2017-12-31 DIAGNOSIS — L989 Disorder of the skin and subcutaneous tissue, unspecified: Secondary | ICD-10-CM | POA: Diagnosis not present

## 2017-12-31 DIAGNOSIS — R609 Edema, unspecified: Secondary | ICD-10-CM | POA: Diagnosis not present

## 2017-12-31 DIAGNOSIS — N950 Postmenopausal bleeding: Secondary | ICD-10-CM | POA: Diagnosis not present

## 2017-12-31 NOTE — Patient Instructions (Signed)

## 2017-12-31 NOTE — Progress Notes (Signed)
   Subjective:    Patient ID: Kimberly Walsh, female    DOB: [birth year], 72 y.o.   MRN: [id number]   Chief Complaint: 1- post menopausal bleeding 2- bald spot in head  3- peripheral edema  HPI -Patient was seen for chronic follow up on 12/16/17. She stated that she had some vaginal bleeding that last several days. She has been menopausal for over 15 years. She denies any abdominal cramping. She is here today for endometrial bx -discolored flat lesion right scalp- slight itching - swelling of right lower leg- she has elevated it and she cannot get swelling to go down. Takes fluid pill daily   Review of Systems  Constitutional: Negative.   HENT: Negative.   Respiratory: Negative.   Cardiovascular: Positive for leg swelling (right).  Gastrointestinal: Negative.   Musculoskeletal: Negative.   Skin:       Scalp lesion  Neurological: Negative.   Psychiatric/Behavioral: Negative.   All other systems reviewed and are negative.      Objective:   Physical Exam  Constitutional: She appears well-developed and well-nourished. No distress.  Cardiovascular: Normal rate.  Pulmonary/Chest: Effort normal.  Abdominal: Soft.  Genitourinary: Vagina normal and uterus normal.  Musculoskeletal: She exhibits edema (2+edema righ lower ext).  Skin: Skin is warm and dry.  3cm flat macular lesion right scalp area  Psychiatric: She has a normal mood and affect. Her behavior is normal. Judgment and thought content normal.      BP (!) 154/75   Pulse 74   Temp (!) 97 F (36.1 C) (Oral)   Ht 5\' 6"  (1.676 m)   Wt 227 lb (103 kg)   BMI 36.64 kg/m   Procedure:  Lithotomy position  Large speculum inserted  Single tooth tenaculum ant cervical lip  Pipette used for endometrial bx  Tenaculum removed  Speculum removed  Patient legs removed form stirrups  Patient tolerated procedure well.         Assessment & Plan:  Kimberly Walsh comes in today with chief complaint of Vaginal Bleeding  (endometrial bx)   Diagnosis and orders addressed:  1. Scalp lesion continue to watch until see derm - Ambulatory referral to Dermatology  2. Peripheral edema Unna boot to right leg elevate when sittting  3. Post-menopausal bleeding Will talk once get bx report May having spotting for the next 2 days - Pathology    Follow up plan: prn   [place], FNP

## 2018-01-04 LAB — PATHOLOGY

## 2018-01-05 ENCOUNTER — Telehealth: Payer: Self-pay | Admitting: Nurse Practitioner

## 2018-01-05 NOTE — Telephone Encounter (Signed)
No cancer cells seen.  Biopsy did not show any of the endometrial cells we are hoping to find to make sure that they are normal.  She will hear from Kimberly Walsh when she gets back from vacation next week.  She may need additional biopsy or referral to GYN if so.

## 2018-01-06 NOTE — Telephone Encounter (Signed)
Please see results notes. Will close encounter.

## 2018-01-07 NOTE — Telephone Encounter (Signed)
FYI

## 2018-01-09 ENCOUNTER — Other Ambulatory Visit: Payer: Self-pay | Admitting: Nurse Practitioner

## 2018-01-09 DIAGNOSIS — N950 Postmenopausal bleeding: Secondary | ICD-10-CM

## 2018-01-10 ENCOUNTER — Ambulatory Visit: Payer: Medicare Other | Admitting: Nurse Practitioner

## 2018-01-12 ENCOUNTER — Encounter: Payer: Self-pay | Admitting: *Deleted

## 2018-01-15 ENCOUNTER — Other Ambulatory Visit: Payer: Self-pay | Admitting: Nurse Practitioner

## 2018-01-19 ENCOUNTER — Encounter: Payer: Medicare Other | Admitting: Adult Health

## 2018-01-21 ENCOUNTER — Encounter: Payer: Medicare Other | Admitting: Obstetrics and Gynecology

## 2018-02-03 ENCOUNTER — Ambulatory Visit (INDEPENDENT_AMBULATORY_CARE_PROVIDER_SITE_OTHER): Payer: Medicare Other | Admitting: Obstetrics and Gynecology

## 2018-02-03 ENCOUNTER — Encounter: Payer: Self-pay | Admitting: Obstetrics and Gynecology

## 2018-02-03 VITALS — BP 131/77 | HR 72 | Ht 66.0 in | Wt 228.0 lb

## 2018-02-03 DIAGNOSIS — N950 Postmenopausal bleeding: Secondary | ICD-10-CM | POA: Diagnosis not present

## 2018-02-03 NOTE — Progress Notes (Signed)
Patient ID: Kimberly Walsh, female   DOB: [birth year], 72 y.o.   MRN: [id number]    [facility] Visit  @DATE @            Patient name: ARGIE LOBER MRN [id number]  Date of birth: February 08, 1946  CC & HPI:  STEFFANI DIONISIO is a 72 y.o. female presenting today for post menopausal bleeding. The most recent episode was in mid-July and lasted 3 days. She reports a little bit of bleeding in April-July. She was referred by Chevis Pretty, FNP and [place]. They did a biopsy but did not get enough tissue and did not have an [place] yet.  She is currently taking Mobic for arthritis and does not take any blood thinners. She worked in [employer] for 87 yrs and has always worked outside. She is also involved in taking care of her 8 grandchildren. The patient denies fever, chills or any other symptoms or complaints at this time. No hormone tx.  ROS:  ROS  + post menopausal bleeding - fever - chills All systems are negative except as noted in the HPI and PMH.   Pertinent History Reviewed:   Reviewed:  Medical         Past Medical History:  Diagnosis Date   Hyperlipidemia    Venous stasis    Vertigo                               Surgical Hx:    Past Surgical History:  Procedure Laterality Date   CHOLECYSTECTOMY     FINGER SURGERY Right    Ring finger   MIDDLE EAR SURGERY     Fungus   TUBAL LIGATION     VEIN SURGERY     Medications: Reviewed & Updated - see associated section                       Current Outpatient Medications:    furosemide (LASIX) 40 MG tablet, Take 1 tablet (40 mg total) by mouth daily., Disp: 30 tablet, Rfl: 5   losartan (COZAAR) 100 MG tablet, Take 1 tablet (100 mg total) by mouth daily., Disp: 90 tablet, Rfl: 3   meclizine (ANTIVERT) 25 MG tablet, Take 1 Tablet by mouth every 8 hours as needed FOR vertigo, Disp: 30 tablet, Rfl: 0   meloxicam (MOBIC) 15 MG tablet, Take 1 tablet (15 mg total) by mouth daily., Disp: 30 tablet, Rfl:  3   methocarbamol (ROBAXIN) 500 MG tablet, Robaxin 500 mg tablet  Take 1 tablet 3 times a day by oral route., Disp: , Rfl:    traMADol (ULTRAM) 50 MG tablet, Take 1 tablet (50 mg total) by mouth daily., Disp: 30 tablet, Rfl: 0   atorvastatin (LIPITOR) 40 MG tablet, Take 1 tablet (40 mg total) by mouth daily. (Patient not taking: Reported on 02/03/2018), Disp: 90 tablet, Rfl: 1   cyclobenzaprine (FLEXERIL) 10 MG tablet, Take 1 Tablet by mouth 3 times a day as needed FOR MUSCLE SPASMS (Patient not taking: Reported on 02/03/2018), Disp: 90 tablet, Rfl: 0   levothyroxine (SYNTHROID) 50 MCG tablet, Take 1 tablet (50 mcg total) by mouth daily. (Patient not taking: Reported on 02/03/2018), Disp: 30 tablet, Rfl: 11   Social History: Reviewed -  reports that she has never smoked. She has never used smokeless tobacco.  Objective Findings:  Vitals: Blood pressure 131/77, pulse 72, height 5'  6" (1.676 m), weight 228 lb (103.4 kg).  PHYSICAL EXAMINATION General appearance - alert, well appearing, and in no distress, oriented to person, place, and time and overweight Mental status - alert, oriented to person, place, and time, normal mood, behavior, speech, dress, motor activity, and thought processes  PELVIC Vagina - a little bit of pink discharge, atrophic tissue Cervix - smooth, multiparous Uterus - exam limited by body habitus Adnexa - unremarkable  Assessment & Plan:   A:  1. Postmenopausal bleeding, uterine origin.  P:  1. Transvaginal US next week 2. Endometrial biopsy versus hysteroscopy, will review u/s and discuss.  By signing my name below, I, De Burrs, attest that this documentation has been prepared under the direction and in the presence of Estill Dooms, NP. Electronically Signed: De Burrs, Medical Scribe. 02/03/18. 9:44 AM.  I personally performed the services described in this documentation, which was SCRIBED in my presence. The recorded information has been  reviewed and considered accurate. It has been edited as necessary during review. Jonnie Kind, MD

## 2018-02-08 DIAGNOSIS — L814 Other melanin hyperpigmentation: Secondary | ICD-10-CM | POA: Diagnosis not present

## 2018-02-08 DIAGNOSIS — L570 Actinic keratosis: Secondary | ICD-10-CM | POA: Diagnosis not present

## 2018-02-08 DIAGNOSIS — L820 Inflamed seborrheic keratosis: Secondary | ICD-10-CM | POA: Diagnosis not present

## 2018-02-08 DIAGNOSIS — Z85828 Personal history of other malignant neoplasm of skin: Secondary | ICD-10-CM | POA: Diagnosis not present

## 2018-02-08 DIAGNOSIS — L821 Other seborrheic keratosis: Secondary | ICD-10-CM | POA: Diagnosis not present

## 2018-02-15 ENCOUNTER — Other Ambulatory Visit: Payer: Self-pay | Admitting: Obstetrics and Gynecology

## 2018-02-15 ENCOUNTER — Ambulatory Visit (INDEPENDENT_AMBULATORY_CARE_PROVIDER_SITE_OTHER): Payer: Medicare Other | Admitting: Obstetrics and Gynecology

## 2018-02-15 ENCOUNTER — Ambulatory Visit (INDEPENDENT_AMBULATORY_CARE_PROVIDER_SITE_OTHER): Payer: Medicare Other

## 2018-02-15 ENCOUNTER — Ambulatory Visit: Payer: Medicare Other | Admitting: *Deleted

## 2018-02-15 ENCOUNTER — Encounter: Payer: Self-pay | Admitting: Obstetrics and Gynecology

## 2018-02-15 VITALS — BP 126/72 | HR 83 | Ht 66.0 in | Wt 224.0 lb

## 2018-02-15 DIAGNOSIS — N950 Postmenopausal bleeding: Secondary | ICD-10-CM | POA: Diagnosis not present

## 2018-02-15 DIAGNOSIS — Z8742 Personal history of other diseases of the female genital tract: Secondary | ICD-10-CM | POA: Diagnosis not present

## 2018-02-15 NOTE — Progress Notes (Signed)
PELVIC US TA/TV: heterogeneous retroverted uterus,echogenic posterior fundal fibroid 1.3 x 1.2 x 1.6 cm,thickened EEC 6.3 mm,endometrium appears to be slightly deviated from the fibroid,normal left ovary,normal right ovary w/a simple right adnexal cyst 1.1 x .7 x 1 cm,ovaries appear to be mobile,no pain during ultrasound,no free fluid

## 2018-02-16 ENCOUNTER — Other Ambulatory Visit: Payer: Self-pay | Admitting: Nurse Practitioner

## 2018-02-17 NOTE — Progress Notes (Signed)
[facility] Visit  @DATE @            Patient name: Kimberly Walsh MRN [id number]  Date of birth: July 20, 1945  CC & HPI:  Kimberly Walsh is a 72 y.o. female presenting today for review of u/s after referral for PMB. Referring provider did endometrial biopsy.  It was referred to the question of no endometrial tissue identified.  The biopsy report appears that a reasonable endometrial biopsy effort was made PATH:Diagnosis:  ENDOMETRIUM, BIOPSY:  MINUTE FRAGMENTS OF BENIGN ENDOCERVICAL GLANDS, SQUAMOUS METAPLASIA,  INFLAMMATION, BLOOD, AND MUCUS. NO ENDOMETRIAL TISSUE IDENTIFIED.  SOL/01/04/2018  ROS:  ROS   Pertinent History Reviewed:   Reviewed: Significant for  Medical         Past Medical History:  Diagnosis Date  . Hyperlipidemia   . Venous stasis   . Vertigo                               Surgical Hx:    Past Surgical History:  Procedure Laterality Date  . basal carcinoma rt leg    . CHOLECYSTECTOMY    . FINGER SURGERY Right    Ring finger  . MIDDLE EAR SURGERY     Fungus  . TUBAL LIGATION    . VEIN SURGERY     Medications: Reviewed & Updated - see associated section                       Current Outpatient Medications:  .  atorvastatin (LIPITOR) 40 MG tablet, Take 1 tablet (40 mg total) by mouth daily., Disp: 90 tablet, Rfl: 1 .  cyclobenzaprine (FLEXERIL) 10 MG tablet, Take 1 Tablet by mouth 3 times a day as needed FOR MUSCLE SPASMS, Disp: 90 tablet, Rfl: 0 .  furosemide (LASIX) 40 MG tablet, Take 1 tablet (40 mg total) by mouth daily., Disp: 30 tablet, Rfl: 5 .  levothyroxine (SYNTHROID) 50 MCG tablet, Take 1 tablet (50 mcg total) by mouth daily., Disp: 30 tablet, Rfl: 11 .  losartan (COZAAR) 100 MG tablet, Take 1 tablet (100 mg total) by mouth daily., Disp: 90 tablet, Rfl: 3 .  meloxicam (MOBIC) 15 MG tablet, Take 1 tablet (15 mg total) by mouth daily., Disp: 30 tablet, Rfl: 3 .  methocarbamol (ROBAXIN) 500 MG tablet, Robaxin 500 mg tablet  Take 1  tablet 3 times a day by oral route., Disp: , Rfl:  .  traMADol (ULTRAM) 50 MG tablet, Take 1 tablet (50 mg total) by mouth daily., Disp: 30 tablet, Rfl: 0 .  meclizine (ANTIVERT) 25 MG tablet, Take 1 Tablet by mouth every 8 hours as needed FOR vertigo (Patient not taking: Reported on 02/15/2018), Disp: 30 tablet, Rfl: 0   Social History: Reviewed -  reports that she has never smoked. She has never used smokeless tobacco.  Objective Findings:  Vitals: Blood pressure 126/72, pulse 83, height 5\' 6"  (1.676 m), weight 224 lb (101.6 kg).  PHYSICAL EXAMINATION General appearance - alert, well appearing, and in no distress, oriented to person, place, and time and normal appearing weight Mental status - alert, oriented to person, place, and time, normal mood, behavior, speech, dress, motor activity, and thought processes Chest -  Heart -  Abdomen - soft, nontender, nondistended, no masses or organomegaly Breasts -  Skin - normal coloration and turgor, no rashes, no suspicious skin lesions noted  PELVIC External genitalia -  normal Vulva - normal Vagina - normal Cervix - atrophic  Uterus -mobile nontender normal size postmenopausal status  Adnexa -  [place] -  Rectal - rectal exam not indicated  Kimberly Walsh is a 72 y.o. G3P3003 she is here for a pelvic sonogram for postmenopausal bleeding.  Uterus                      8.4 x 6 x 7 cm, vol 185 ml,heterogeneous retroverted uterus,echogenic posterior submucosal fundal fibroid 1.3 x 1.2 x 1.6 cm  Endometrium          6.3 mm, symmetrical, thickened endometrium, appears to be slightly deviated from the posterior fundal fibroid  Right ovary             2.3 x 1.5 x 1.5 cm,normal right ovary w/a simple right adnexal cyst 1.1 x .7 x 1 cm  Left ovary                2.4 x 1.6 x 1.6 cm, wnl  No free fluid   Technician Comments:  PELVIC US TA/TV: heterogeneous retroverted uterus,echogenic posterior fundal fibroid 1.3 x 1.2 x 1.6  cm,thickened EEC 6.3 mm,endometrium appears to be slightly deviated from the posterior fundal fibroid,normal left ovary,normal right ovary w/a simple right adnexal cyst 1.1 x .7 x 1 cm,ovaries appear to be mobile,no pain during ultrasound,no free fluid    [employer] 02/15/2018 9:51 AM  Clinical Impression and recommendations:  I have reviewed the sonogram results above.  Combined with the patient's current clinical course, below are my impressions and any appropriate recommendations for management based on the sonographic findings:  1. Pt has a well delineated thin endometrial stripe (images 21, 30) with no vascularity in endometrium, no cystic areas. 2. In the absence of recurrent bleeding , feel no further biopsy required. 3 discussed in detail with patient who agrees with plan for following , in the absence of recurrent bleeding.[place]:   A:  1. Single episode postmenopausal bleeding with previous effort endometrial biopsy negative for cancer, but questionable for adequate endometrial sample 2. Ultrasound which shows a thin endometrium which measures less than 5 mm in virtually every measurement, submucosal fibroid  P:  1. Lengthy discussion with the patient over options.  Option of repeat endometrial biopsy was given to the patient who is not bleeding at present, has not bled since the single episode, and is comfortable waiting, given the thin relatively thin endometrium.  Patient is aware that future bleeding occurs that we will proceed with endometrial biopsy.  This plan is consistent with 1 of the treatment options listed in up-to-date which says that ultrasound is adequate screening for single episode of endometrial bleeding.  The fact that effort to sample the endometrium is already been done should further reduce likelihood of precancerous changes.  Will schedule follow-up patient appointment in 3-6 months to ensure that we keep in touch  with her

## 2018-02-19 DIAGNOSIS — H5509 Other forms of nystagmus: Secondary | ICD-10-CM | POA: Diagnosis not present

## 2018-02-19 DIAGNOSIS — R51 Headache: Secondary | ICD-10-CM | POA: Diagnosis not present

## 2018-02-19 DIAGNOSIS — I340 Nonrheumatic mitral (valve) insufficiency: Secondary | ICD-10-CM | POA: Diagnosis not present

## 2018-02-19 DIAGNOSIS — N179 Acute kidney failure, unspecified: Secondary | ICD-10-CM | POA: Diagnosis not present

## 2018-02-19 DIAGNOSIS — R55 Syncope and collapse: Secondary | ICD-10-CM | POA: Diagnosis not present

## 2018-02-19 DIAGNOSIS — R531 Weakness: Secondary | ICD-10-CM | POA: Diagnosis not present

## 2018-02-19 DIAGNOSIS — R197 Diarrhea, unspecified: Secondary | ICD-10-CM | POA: Diagnosis not present

## 2018-02-19 DIAGNOSIS — E039 Hypothyroidism, unspecified: Secondary | ICD-10-CM | POA: Diagnosis not present

## 2018-02-19 DIAGNOSIS — Z049 Encounter for examination and observation for unspecified reason: Secondary | ICD-10-CM | POA: Diagnosis not present

## 2018-02-19 DIAGNOSIS — I10 Essential (primary) hypertension: Secondary | ICD-10-CM | POA: Diagnosis not present

## 2018-02-19 DIAGNOSIS — E785 Hyperlipidemia, unspecified: Secondary | ICD-10-CM | POA: Diagnosis not present

## 2018-02-19 DIAGNOSIS — N950 Postmenopausal bleeding: Secondary | ICD-10-CM | POA: Diagnosis not present

## 2018-02-19 DIAGNOSIS — H8110 Benign paroxysmal vertigo, unspecified ear: Secondary | ICD-10-CM | POA: Diagnosis not present

## 2018-02-19 DIAGNOSIS — R2990 Unspecified symptoms and signs involving the nervous system: Secondary | ICD-10-CM | POA: Diagnosis not present

## 2018-02-19 DIAGNOSIS — I517 Cardiomegaly: Secondary | ICD-10-CM | POA: Diagnosis not present

## 2018-02-19 DIAGNOSIS — R42 Dizziness and giddiness: Secondary | ICD-10-CM | POA: Diagnosis not present

## 2018-02-19 DIAGNOSIS — R200 Anesthesia of skin: Secondary | ICD-10-CM | POA: Diagnosis not present

## 2018-02-19 DIAGNOSIS — G459 Transient cerebral ischemic attack, unspecified: Secondary | ICD-10-CM | POA: Diagnosis not present

## 2018-02-19 DIAGNOSIS — R05 Cough: Secondary | ICD-10-CM | POA: Diagnosis not present

## 2018-02-19 DIAGNOSIS — R112 Nausea with vomiting, unspecified: Secondary | ICD-10-CM | POA: Diagnosis not present

## 2018-02-19 DIAGNOSIS — R202 Paresthesia of skin: Secondary | ICD-10-CM | POA: Diagnosis not present

## 2018-02-20 DIAGNOSIS — H8110 Benign paroxysmal vertigo, unspecified ear: Secondary | ICD-10-CM | POA: Diagnosis present

## 2018-02-20 DIAGNOSIS — E039 Hypothyroidism, unspecified: Secondary | ICD-10-CM | POA: Diagnosis present

## 2018-02-20 DIAGNOSIS — E785 Hyperlipidemia, unspecified: Secondary | ICD-10-CM | POA: Diagnosis present

## 2018-02-20 DIAGNOSIS — N950 Postmenopausal bleeding: Secondary | ICD-10-CM | POA: Diagnosis present

## 2018-02-20 DIAGNOSIS — Z79890 Hormone replacement therapy (postmenopausal): Secondary | ICD-10-CM | POA: Diagnosis not present

## 2018-02-20 DIAGNOSIS — I440 Atrioventricular block, first degree: Secondary | ICD-10-CM | POA: Diagnosis not present

## 2018-02-20 DIAGNOSIS — N179 Acute kidney failure, unspecified: Secondary | ICD-10-CM | POA: Diagnosis present

## 2018-02-20 DIAGNOSIS — H933X9 Disorders of unspecified acoustic nerve: Secondary | ICD-10-CM | POA: Diagnosis not present

## 2018-02-20 DIAGNOSIS — R42 Dizziness and giddiness: Secondary | ICD-10-CM | POA: Diagnosis not present

## 2018-02-20 DIAGNOSIS — R200 Anesthesia of skin: Secondary | ICD-10-CM | POA: Diagnosis not present

## 2018-02-20 DIAGNOSIS — H81399 Other peripheral vertigo, unspecified ear: Secondary | ICD-10-CM | POA: Diagnosis present

## 2018-02-20 DIAGNOSIS — E869 Volume depletion, unspecified: Secondary | ICD-10-CM | POA: Diagnosis present

## 2018-02-20 DIAGNOSIS — Z825 Family history of asthma and other chronic lower respiratory diseases: Secondary | ICD-10-CM | POA: Diagnosis not present

## 2018-02-20 DIAGNOSIS — R112 Nausea with vomiting, unspecified: Secondary | ICD-10-CM | POA: Diagnosis not present

## 2018-02-20 DIAGNOSIS — I10 Essential (primary) hypertension: Secondary | ICD-10-CM | POA: Diagnosis present

## 2018-02-20 DIAGNOSIS — R197 Diarrhea, unspecified: Secondary | ICD-10-CM | POA: Diagnosis not present

## 2018-02-20 DIAGNOSIS — R05 Cough: Secondary | ICD-10-CM | POA: Diagnosis not present

## 2018-02-20 DIAGNOSIS — R110 Nausea: Secondary | ICD-10-CM | POA: Diagnosis not present

## 2018-02-20 DIAGNOSIS — R531 Weakness: Secondary | ICD-10-CM | POA: Diagnosis not present

## 2018-02-20 DIAGNOSIS — R946 Abnormal results of thyroid function studies: Secondary | ICD-10-CM | POA: Diagnosis present

## 2018-02-20 DIAGNOSIS — R55 Syncope and collapse: Secondary | ICD-10-CM | POA: Diagnosis present

## 2018-02-23 ENCOUNTER — Telehealth: Payer: Self-pay | Admitting: Obstetrics and Gynecology

## 2018-02-23 DIAGNOSIS — H8123 Vestibular neuronitis, bilateral: Secondary | ICD-10-CM | POA: Diagnosis not present

## 2018-02-23 DIAGNOSIS — H8113 Benign paroxysmal vertigo, bilateral: Secondary | ICD-10-CM | POA: Diagnosis not present

## 2018-02-23 NOTE — Telephone Encounter (Signed)
I contacted ms Kimberly Walsh to be sure that she remained comfortable with our plan to follow pt, based on single episoe of PMB, a symmetric endometrial stripe with most measurements 4 mm or less, no suspicion of polyps or doppler flow in endometrium.  Pt agrees with plan to follow, no further biopsy in the absence of recurrent bleeding.  Will CC: Chevis Pretty re results of u/s and plan.

## 2018-02-24 ENCOUNTER — Telehealth: Payer: Self-pay | Admitting: Nurse Practitioner

## 2018-02-24 NOTE — Telephone Encounter (Signed)
Pt notified of appt 

## 2018-02-25 DIAGNOSIS — H8123 Vestibular neuronitis, bilateral: Secondary | ICD-10-CM | POA: Diagnosis not present

## 2018-02-25 DIAGNOSIS — H8113 Benign paroxysmal vertigo, bilateral: Secondary | ICD-10-CM | POA: Diagnosis not present

## 2018-02-28 DIAGNOSIS — H8113 Benign paroxysmal vertigo, bilateral: Secondary | ICD-10-CM | POA: Diagnosis not present

## 2018-02-28 DIAGNOSIS — H8123 Vestibular neuronitis, bilateral: Secondary | ICD-10-CM | POA: Diagnosis not present

## 2018-03-02 DIAGNOSIS — H8113 Benign paroxysmal vertigo, bilateral: Secondary | ICD-10-CM | POA: Diagnosis not present

## 2018-03-02 DIAGNOSIS — H8123 Vestibular neuronitis, bilateral: Secondary | ICD-10-CM | POA: Diagnosis not present

## 2018-03-04 ENCOUNTER — Encounter: Payer: Self-pay | Admitting: Nurse Practitioner

## 2018-03-04 ENCOUNTER — Ambulatory Visit (INDEPENDENT_AMBULATORY_CARE_PROVIDER_SITE_OTHER): Payer: Medicare Other | Admitting: Nurse Practitioner

## 2018-03-04 VITALS — BP 148/74 | HR 107 | Temp 97.7°F | Ht 66.0 in | Wt 220.0 lb

## 2018-03-04 DIAGNOSIS — E034 Atrophy of thyroid (acquired): Secondary | ICD-10-CM | POA: Diagnosis not present

## 2018-03-04 DIAGNOSIS — H8303 Labyrinthitis, bilateral: Secondary | ICD-10-CM | POA: Diagnosis not present

## 2018-03-04 DIAGNOSIS — Z09 Encounter for follow-up examination after completed treatment for conditions other than malignant neoplasm: Secondary | ICD-10-CM | POA: Diagnosis not present

## 2018-03-04 LAB — CBC WITH DIFFERENTIAL/PLATELET
BASOS ABS: 0.1 10*3/uL (ref 0.0–0.2)
Basos: 1 %
EOS (ABSOLUTE): 0.3 10*3/uL (ref 0.0–0.4)
EOS: 4 %
Hematocrit: 42 % (ref 34.0–46.6)
Hemoglobin: 14 g/dL (ref 11.1–15.9)
IMMATURE GRANULOCYTES: 0 %
Immature Grans (Abs): 0 10*3/uL (ref 0.0–0.1)
Lymphocytes Absolute: 1.9 10*3/uL (ref 0.7–3.1)
Lymphs: 24 %
MCH: 29.8 pg (ref 26.6–33.0)
MCHC: 33.3 g/dL (ref 31.5–35.7)
MCV: 89 fL (ref 79–97)
Monocytes Absolute: 0.5 10*3/uL (ref 0.1–0.9)
Monocytes: 6 %
NEUTROS PCT: 65 %
Neutrophils Absolute: 5.3 10*3/uL (ref 1.4–7.0)
PLATELETS: 258 10*3/uL (ref 150–450)
RBC: 4.7 x10E6/uL (ref 3.77–5.28)
RDW: 13.4 % (ref 12.3–15.4)
WBC: 8.2 10*3/uL (ref 3.4–10.8)

## 2018-03-04 LAB — BMP8+EGFR
BUN/Creatinine Ratio: 13 (ref 12–28)
BUN: 21 mg/dL (ref 8–27)
CALCIUM: 9.9 mg/dL (ref 8.7–10.3)
CHLORIDE: 101 mmol/L (ref 96–106)
CO2: 22 mmol/L (ref 20–29)
Creatinine, Ser: 1.67 mg/dL — ABNORMAL HIGH (ref 0.57–1.00)
GFR calc Af Amer: 35 mL/min/{1.73_m2} — ABNORMAL LOW (ref >59)
GFR calc non Af Amer: 31 mL/min/{1.73_m2} — ABNORMAL LOW (ref >59)
GLUCOSE: 126 mg/dL — AB (ref 65–99)
Potassium: 3.6 mmol/L (ref 3.5–5.2)
SODIUM: 141 mmol/L (ref 134–144)

## 2018-03-04 NOTE — Progress Notes (Signed)
   Subjective:    Patient ID: Kimberly Walsh, female    DOB: Jan 10, 1946, 72 y.o.   MRN: [id number]   Chief Complaint: Hospitalization Follow-up   HPI Patient went to the hospital on 02/19/18 c/o dizziness, nausea and vomiting. She was admitted for 4 days and was given IV fluids and zofran. Dx with labyrinthitis and dehydration. She is still out of work. Is recieving PT to learn how to move her head when dizzy. She is much better but is still very weak.   * hospital told her her thyroid levels were not normal.  Review of Systems  Constitutional: Negative for activity change and appetite change.  HENT: Negative.   Eyes: Negative for pain.  Respiratory: Negative for shortness of breath.   Cardiovascular: Negative for chest pain, palpitations and leg swelling.  Gastrointestinal: Negative for abdominal pain.  Endocrine: Negative for polydipsia.  Genitourinary: Negative.   Skin: Negative for rash.  Neurological: Negative for dizziness, weakness and headaches.  Hematological: Does not bruise/bleed easily.  Psychiatric/Behavioral: Negative.   All other systems reviewed and are negative.      Objective:   Physical Exam  Constitutional: She is oriented to person, place, and time. She appears well-developed and well-nourished. No distress.  Cardiovascular: Normal rate and regular rhythm.  Pulmonary/Chest: Effort normal and breath sounds normal.  Abdominal: Soft.  Neurological: She is alert and oriented to person, place, and time. She displays normal reflexes. No cranial nerve deficit. Coordination normal.  Skin: Skin is warm and dry.  Psychiatric: She has a normal mood and affect. Her behavior is normal. Thought content normal.   BP (!) 148/74   Pulse (!) 107   Temp 97.7 F (36.5 C) (Oral)   Ht '5\' 6"'$  (1.676 m)   Wt 220 lb (99.8 kg)   BMI 35.51 kg/m         Assessment & Plan:  Kimberly Walsh in today with chief complaint of Hospitalization Follow-up   1. Labyrinthitis of  both ears Force fluids Finish PT as rx - CBC with Differential/Platelet - BMP8+EGFR  2. Hypothyroidism due to acquired atrophy of thyroid Will receehck levels today since hospital said were not normal  3. Hospital discharge follow-up Hospital records reviewed  [place], [place]

## 2018-03-04 NOTE — Patient Instructions (Signed)
Labyrinthitis Labyrinthitis is an infection of the inner ear. Your inner ear is a system of tubes and canals (labyrinth). These are filled with fluid. Nerve cells in your inner ear send signals for hearing and balance to your brain. When tiny germs get inside the tubes and canals, they harm the cells that send messages to the brain. This can cause changes in hearing and balance. Follow these instructions at home:  Take medicines only as told by your doctor.  If you were prescribed an antibiotic medicine, finish all of it even if you start to feel better.  Rest as much as possible.  Avoid loud noises and bright lights.  Do not make sudden movements until any dizziness goes away.  Do not drive until your doctor says that you can.  Drink enough fluid to keep your pee (urine) clear or pale yellow.  Work with a physical therapist if you still feel dizzy after several weeks. A therapist can teach you exercises to help you deal with your dizziness.  Keep all follow-up visits as told by your doctor. This is important. Contact a doctor if:  Your symptoms do not get better with medicines.  You do not get better after two weeks.  You have a fever. Get help right away if:  You are very dizzy.  You keep throwing up (vomiting) or keep feeling sick to your stomach (nauseous).  Your hearing gets a lot worse very quickly. This information is not intended to replace advice given to you by your health care provider. Make sure you discuss any questions you have with your health care provider. Document Released: 06/08/2005 Document Revised: 11/14/2015 Document Reviewed: 03/20/2014 Elsevier Interactive Patient Education  2018 Elsevier Inc.  

## 2018-03-07 ENCOUNTER — Ambulatory Visit (INDEPENDENT_AMBULATORY_CARE_PROVIDER_SITE_OTHER): Payer: Medicare Other

## 2018-03-07 ENCOUNTER — Encounter: Payer: Self-pay | Admitting: Nurse Practitioner

## 2018-03-07 DIAGNOSIS — H8123 Vestibular neuronitis, bilateral: Secondary | ICD-10-CM

## 2018-03-07 DIAGNOSIS — E039 Hypothyroidism, unspecified: Secondary | ICD-10-CM

## 2018-03-07 DIAGNOSIS — I10 Essential (primary) hypertension: Secondary | ICD-10-CM | POA: Diagnosis not present

## 2018-03-07 DIAGNOSIS — M19041 Primary osteoarthritis, right hand: Secondary | ICD-10-CM

## 2018-03-07 DIAGNOSIS — Z9181 History of falling: Secondary | ICD-10-CM

## 2018-03-07 DIAGNOSIS — H8113 Benign paroxysmal vertigo, bilateral: Secondary | ICD-10-CM

## 2018-03-07 DIAGNOSIS — N183 Chronic kidney disease, stage 3 unspecified: Secondary | ICD-10-CM | POA: Insufficient documentation

## 2018-03-07 DIAGNOSIS — E669 Obesity, unspecified: Secondary | ICD-10-CM | POA: Diagnosis not present

## 2018-03-07 DIAGNOSIS — M19042 Primary osteoarthritis, left hand: Secondary | ICD-10-CM

## 2018-03-14 ENCOUNTER — Other Ambulatory Visit: Payer: Self-pay | Admitting: Nurse Practitioner

## 2018-03-14 DIAGNOSIS — I872 Venous insufficiency (chronic) (peripheral): Secondary | ICD-10-CM | POA: Diagnosis not present

## 2018-03-14 DIAGNOSIS — H8113 Benign paroxysmal vertigo, bilateral: Secondary | ICD-10-CM | POA: Diagnosis not present

## 2018-03-14 DIAGNOSIS — I83891 Varicose veins of right lower extremities with other complications: Secondary | ICD-10-CM | POA: Diagnosis not present

## 2018-03-14 DIAGNOSIS — H8123 Vestibular neuronitis, bilateral: Secondary | ICD-10-CM | POA: Diagnosis not present

## 2018-03-16 DIAGNOSIS — H8123 Vestibular neuronitis, bilateral: Secondary | ICD-10-CM | POA: Diagnosis not present

## 2018-03-16 DIAGNOSIS — H8113 Benign paroxysmal vertigo, bilateral: Secondary | ICD-10-CM | POA: Diagnosis not present

## 2018-03-21 ENCOUNTER — Ambulatory Visit (INDEPENDENT_AMBULATORY_CARE_PROVIDER_SITE_OTHER): Payer: Medicare Other | Admitting: Nurse Practitioner

## 2018-03-21 ENCOUNTER — Encounter: Payer: Self-pay | Admitting: Nurse Practitioner

## 2018-03-21 VITALS — BP 133/80 | HR 88 | Temp 97.3°F | Ht 66.0 in | Wt 223.0 lb

## 2018-03-21 DIAGNOSIS — N183 Chronic kidney disease, stage 3 unspecified: Secondary | ICD-10-CM

## 2018-03-21 DIAGNOSIS — Z6837 Body mass index (BMI) 37.0-37.9, adult: Secondary | ICD-10-CM

## 2018-03-21 DIAGNOSIS — E034 Atrophy of thyroid (acquired): Secondary | ICD-10-CM

## 2018-03-21 DIAGNOSIS — I10 Essential (primary) hypertension: Secondary | ICD-10-CM | POA: Diagnosis not present

## 2018-03-21 DIAGNOSIS — M19042 Primary osteoarthritis, left hand: Secondary | ICD-10-CM | POA: Diagnosis not present

## 2018-03-21 DIAGNOSIS — M19041 Primary osteoarthritis, right hand: Secondary | ICD-10-CM

## 2018-03-21 DIAGNOSIS — E782 Mixed hyperlipidemia: Secondary | ICD-10-CM

## 2018-03-21 DIAGNOSIS — R609 Edema, unspecified: Secondary | ICD-10-CM | POA: Diagnosis not present

## 2018-03-21 DIAGNOSIS — H8123 Vestibular neuronitis, bilateral: Secondary | ICD-10-CM | POA: Diagnosis not present

## 2018-03-21 DIAGNOSIS — H8113 Benign paroxysmal vertigo, bilateral: Secondary | ICD-10-CM | POA: Diagnosis not present

## 2018-03-21 MED ORDER — TRAMADOL HCL 50 MG PO TABS: 50.0000 mg | ORAL_TABLET | Freq: Every day | ORAL | 2 refills | Status: DC

## 2018-03-21 MED ORDER — TRAMADOL HCL 50 MG PO TABS: ORAL_TABLET | ORAL | 2 refills | Status: DC

## 2018-03-21 NOTE — Addendum Note (Signed)
Addended by: Chevis Pretty on: 03/21/2018 12:38 PM   Modules accepted: Orders

## 2018-03-21 NOTE — Patient Instructions (Signed)

## 2018-03-21 NOTE — Progress Notes (Addendum)
Subjective:    Patient ID: Kimberly Walsh, female    DOB: 1945-12-30, 72 y.o.   MRN: [id number]   Chief Complaint: medical management of chronic issues  HPI:  1. Essential hypertension, benign  No c/o chest pain, sob or headache. Does not check blood pressure at home. BP Readings from Last 3 Encounters:  03/04/18 (!) 148/74  02/15/18 126/72  02/03/18 131/77     2. Hypothyroidism due to acquired atrophy of thyroid  Last TSH was 3.9. But when she was in hospital earlier this month and they said thyroid levels were abnormal. But I cannot find record of abnormal labs.  3. CKD (chronic kidney disease) stage 3, GFR 30-59 ml/min (HCC)  Last creatine was 1.67 which was up from 1.42. She denies any problems voiding  4. BMI 37.0-37.9, adult  no recent weight changes  5. Peripheral edema  Has edema daily with right lower leg worse then left  6. Mixed hyperlipidemia  Has not been watching diet at all.  7.      Osteo arthritis of bil hands           takes tramadol 1x per day- really helps with pain.   Outpatient Encounter Medications as of 03/21/2018  Medication Sig  . atorvastatin (LIPITOR) 40 MG tablet Take 1 tablet (40 mg total) by mouth daily.  . furosemide (LASIX) 40 MG tablet Take 1 tablet (40 mg total) by mouth daily. (Patient not taking: Reported on 03/04/2018)  . levothyroxine (SYNTHROID) 50 MCG tablet Take 1 tablet (50 mcg total) by mouth daily.  Marland Kitchen losartan (COZAAR) 100 MG tablet Take 1 tablet (100 mg total) by mouth daily. (Patient not taking: Reported on 03/04/2018)  . meclizine (ANTIVERT) 25 MG tablet Take 1 Tablet by mouth every 8 hours as needed FOR vertigo  . meloxicam (MOBIC) 15 MG tablet TAKE 1 TABLET BY MOUTH ONCE DAILY (Patient not taking: Reported on 03/04/2018)  . traMADol (ULTRAM) 50 MG tablet tramadol 50 mg tablet       New complaints: None today  Social history: Was in hospital at end of last month, first of this month with nausea and vomiting. She is  doing much better today.    Review of Systems  Constitutional: Negative for activity change and appetite change.  HENT: Negative.   Eyes: Negative for pain.  Respiratory: Negative for shortness of breath.   Cardiovascular: Negative for chest pain, palpitations and leg swelling.  Gastrointestinal: Negative for abdominal pain.  Endocrine: Negative for polydipsia.  Genitourinary: Negative.   Skin: Negative for rash.  Neurological: Negative for dizziness, weakness and headaches.  Hematological: Does not bruise/bleed easily.  Psychiatric/Behavioral: Negative.   All other systems reviewed and are negative.      Objective:   Physical Exam  Constitutional: She is oriented to person, place, and time. She appears well-developed and well-nourished. No distress.  HENT:  Head: Normocephalic.  Nose: Nose normal.  Mouth/Throat: Oropharynx is clear and moist.  Eyes: Pupils are equal, round, and reactive to light. EOM are normal.  Neck: Normal range of motion. Neck supple. No JVD present. Carotid bruit is not present.  Cardiovascular: Normal rate, regular rhythm, normal heart sounds and intact distal pulses.  Pulmonary/Chest: Effort normal and breath sounds normal. No respiratory distress. She has no wheezes. She has no rales. She exhibits no tenderness.  Abdominal: Soft. Normal appearance, normal aorta and bowel sounds are normal. She exhibits no distension, no abdominal bruit, no pulsatile midline mass and no mass.  There is no splenomegaly or hepatomegaly. There is no tenderness.  Musculoskeletal: Normal range of motion. She exhibits no edema.  Nodules and deformity of pip joints of bil hands  Lymphadenopathy:    She has no cervical adenopathy.  Neurological: She is alert and oriented to person, place, and time. She has normal reflexes.  Skin: Skin is warm and dry.  Psychiatric: She has a normal mood and affect. Her behavior is normal. Judgment and thought content normal.  Nursing note and  vitals reviewed.  BP 133/80   Pulse 88   Temp (!) 97.3 F (36.3 C) (Oral)   Ht _0  (1.676 m)   Wt 223 lb (101.2 kg)   BMI 35.99 kg/m        Assessment & Plan:  Kimberly Walsh comes in today with chief complaint of No chief complaint on file.   Diagnosis and orders addressed:  1. Essential hypertension, benign Low sodium idet - BMP8+EGFR  2. Hypothyroidism due to acquired atrophy of thyroid Labs pending - Thyroid Panel With TSH  3. CKD (chronic kidney disease) stage 3, GFR 30-59 ml/min (HCC) labe pending  4. BMI 37.0-37.9, adult Discussed diet and exercise for person with BMI >25 Will recheck weight in 3-6 months  5. Peripheral edema Elevate legs wen sitting Wear compression socks daily  6. Mixed hyperlipidemia Low fat diet - Lipid panel  7. Primary osteoarthritis of both hands - traMADol (ULTRAM) 50 MG tablet; tramadol 50 mg tablet  Dispense: 30 tablet; Refill: 2 Referral to rheumatology  Labs pending Health Maintenance reviewed Diet and exercise encouraged  Follow up plan: 3 months   Mary-Margaret Hassell Done, FNP

## 2018-03-21 NOTE — Addendum Note (Signed)
Addended by: Chevis Pretty on: 03/21/2018 01:46 PM   Modules accepted: Orders

## 2018-03-22 LAB — THYROID PANEL WITH TSH
Free Thyroxine Index: 1.7 (ref 1.2–4.9)
T3 UPTAKE RATIO: 25 % (ref 24–39)
T4, Total: 6.7 ug/dL (ref 4.5–12.0)
TSH: 4.07 u[IU]/mL (ref 0.450–4.500)

## 2018-03-22 LAB — BMP8+EGFR
BUN / CREAT RATIO: 12 (ref 12–28)
BUN: 18 mg/dL (ref 8–27)
CALCIUM: 9.8 mg/dL (ref 8.7–10.3)
CO2: 22 mmol/L (ref 20–29)
CREATININE: 1.51 mg/dL — AB (ref 0.57–1.00)
Chloride: 103 mmol/L (ref 96–106)
GFR calc Af Amer: 40 mL/min/{1.73_m2} — ABNORMAL LOW (ref >59)
GFR calc non Af Amer: 35 mL/min/{1.73_m2} — ABNORMAL LOW (ref >59)
Glucose: 82 mg/dL (ref 65–99)
Potassium: 3.6 mmol/L (ref 3.5–5.2)
Sodium: 142 mmol/L (ref 134–144)

## 2018-03-22 LAB — LIPID PANEL
CHOLESTEROL TOTAL: 143 mg/dL (ref 100–199)
Chol/HDL Ratio: 4.2 ratio (ref 0.0–4.4)
HDL: 34 mg/dL — AB (ref >39)
LDL CALC: 68 mg/dL (ref 0–99)
TRIGLYCERIDES: 203 mg/dL — AB (ref 0–149)
VLDL Cholesterol Cal: 41 mg/dL — ABNORMAL HIGH (ref 5–40)

## 2018-03-23 DIAGNOSIS — H8113 Benign paroxysmal vertigo, bilateral: Secondary | ICD-10-CM | POA: Diagnosis not present

## 2018-03-23 DIAGNOSIS — H8123 Vestibular neuronitis, bilateral: Secondary | ICD-10-CM | POA: Diagnosis not present

## 2018-03-28 DIAGNOSIS — H8113 Benign paroxysmal vertigo, bilateral: Secondary | ICD-10-CM | POA: Diagnosis not present

## 2018-03-28 DIAGNOSIS — H8123 Vestibular neuronitis, bilateral: Secondary | ICD-10-CM | POA: Diagnosis not present

## 2018-04-05 DIAGNOSIS — E669 Obesity, unspecified: Secondary | ICD-10-CM | POA: Diagnosis not present

## 2018-04-05 DIAGNOSIS — M150 Primary generalized (osteo)arthritis: Secondary | ICD-10-CM | POA: Diagnosis not present

## 2018-04-05 DIAGNOSIS — M2550 Pain in unspecified joint: Secondary | ICD-10-CM | POA: Diagnosis not present

## 2018-04-05 DIAGNOSIS — Z6836 Body mass index (BMI) 36.0-36.9, adult: Secondary | ICD-10-CM | POA: Diagnosis not present

## 2018-04-05 DIAGNOSIS — Z79899 Other long term (current) drug therapy: Secondary | ICD-10-CM | POA: Diagnosis not present

## 2018-04-05 DIAGNOSIS — N183 Chronic kidney disease, stage 3 (moderate): Secondary | ICD-10-CM | POA: Diagnosis not present

## 2018-04-14 DIAGNOSIS — H8113 Benign paroxysmal vertigo, bilateral: Secondary | ICD-10-CM | POA: Diagnosis not present

## 2018-04-14 DIAGNOSIS — H8123 Vestibular neuronitis, bilateral: Secondary | ICD-10-CM | POA: Diagnosis not present

## 2018-04-26 DIAGNOSIS — I83891 Varicose veins of right lower extremities with other complications: Secondary | ICD-10-CM | POA: Diagnosis not present

## 2018-04-26 DIAGNOSIS — I872 Venous insufficiency (chronic) (peripheral): Secondary | ICD-10-CM | POA: Diagnosis not present

## 2018-05-03 DIAGNOSIS — Z1231 Encounter for screening mammogram for malignant neoplasm of breast: Secondary | ICD-10-CM | POA: Diagnosis not present

## 2018-05-03 LAB — HM MAMMOGRAPHY

## 2018-05-23 DIAGNOSIS — I83891 Varicose veins of right lower extremities with other complications: Secondary | ICD-10-CM | POA: Diagnosis not present

## 2018-06-03 DIAGNOSIS — I83891 Varicose veins of right lower extremities with other complications: Secondary | ICD-10-CM | POA: Diagnosis not present

## 2018-06-23 ENCOUNTER — Encounter: Payer: Self-pay | Admitting: Nurse Practitioner

## 2018-06-23 ENCOUNTER — Ambulatory Visit (INDEPENDENT_AMBULATORY_CARE_PROVIDER_SITE_OTHER): Payer: Medicare Other | Admitting: Nurse Practitioner

## 2018-06-23 VITALS — BP 136/75 | HR 78 | Temp 97.0°F | Ht 66.0 in | Wt 225.0 lb

## 2018-06-23 DIAGNOSIS — I10 Essential (primary) hypertension: Secondary | ICD-10-CM

## 2018-06-23 DIAGNOSIS — Z6837 Body mass index (BMI) 37.0-37.9, adult: Secondary | ICD-10-CM | POA: Diagnosis not present

## 2018-06-23 DIAGNOSIS — E034 Atrophy of thyroid (acquired): Secondary | ICD-10-CM | POA: Diagnosis not present

## 2018-06-23 DIAGNOSIS — R609 Edema, unspecified: Secondary | ICD-10-CM | POA: Diagnosis not present

## 2018-06-23 DIAGNOSIS — M19042 Primary osteoarthritis, left hand: Secondary | ICD-10-CM | POA: Diagnosis not present

## 2018-06-23 DIAGNOSIS — N183 Chronic kidney disease, stage 3 unspecified: Secondary | ICD-10-CM

## 2018-06-23 DIAGNOSIS — I83891 Varicose veins of right lower extremities with other complications: Secondary | ICD-10-CM | POA: Diagnosis not present

## 2018-06-23 DIAGNOSIS — E782 Mixed hyperlipidemia: Secondary | ICD-10-CM

## 2018-06-23 DIAGNOSIS — M19041 Primary osteoarthritis, right hand: Secondary | ICD-10-CM

## 2018-06-23 DIAGNOSIS — R8290 Unspecified abnormal findings in urine: Secondary | ICD-10-CM | POA: Diagnosis not present

## 2018-06-23 DIAGNOSIS — I872 Venous insufficiency (chronic) (peripheral): Secondary | ICD-10-CM | POA: Diagnosis not present

## 2018-06-23 LAB — URINALYSIS, COMPLETE
BILIRUBIN UA: NEGATIVE
Glucose, UA: NEGATIVE
Nitrite, UA: NEGATIVE
PH UA: 7 (ref 5.0–7.5)
Protein, UA: NEGATIVE
RBC UA: NEGATIVE
Specific Gravity, UA: 1.015 (ref 1.005–1.030)
UUROB: 1 mg/dL (ref 0.2–1.0)

## 2018-06-23 LAB — MICROSCOPIC EXAMINATION: RBC, UA: NONE SEEN /hpf (ref 0–2)

## 2018-06-23 MED ORDER — TRAMADOL HCL 50 MG PO TABS: 50.0000 mg | ORAL_TABLET | Freq: Every day | ORAL | 2 refills | Status: DC

## 2018-06-23 MED ORDER — ATORVASTATIN CALCIUM 40 MG PO TABS: 40.0000 mg | ORAL_TABLET | Freq: Every day | ORAL | 1 refills | Status: DC

## 2018-06-23 MED ORDER — FUROSEMIDE 40 MG PO TABS: 40.0000 mg | ORAL_TABLET | Freq: Every day | ORAL | 1 refills | Status: DC

## 2018-06-23 NOTE — Patient Instructions (Signed)

## 2018-06-23 NOTE — Progress Notes (Signed)
Subjective:    Patient ID: Kimberly Walsh, female    DOB: 11-Nov-1945, 73 y.o.   MRN: [id number]   Chief Complaint: Medical Management of Chronic Issues (foul smelling urine)   HPI:  1. Foul smelling urine  Started last week, but is better today. Denies and dysuria  2. Essential hypertension, benign  No c/o chest pain, sob or headaches. She has not been checking blood pressure at home.  BP Readings from Last 3 Encounters:  06/23/18 136/75  03/21/18 133/80  03/04/18 (!) 148/74     3. Hypothyroidism due to acquired atrophy of thyroid  No problems that she is aware of.  4. Primary osteoarthritis of both hands  Hands ache all the time. Especially when she over uses them. Takes ultram 1x daily. Works well to help her thorugh the day.  5. CKD (chronic kidney disease) stage 3, GFR 30-59 ml/min (HCC)  Will recheck labs today  6. Peripheral edema  Has edema daly in lower ext. Almost completely resolves at noght  7. Mixed hyperlipidemia  Does not watch diet and does very little exercise  8. BMI 37.0-37.9, adult  Weight is up a couple of lbs since last visit.    Outpatient Encounter Medications as of 06/23/2018  Medication Sig  . atorvastatin (LIPITOR) 40 MG tablet Take 1 tablet (40 mg total) by mouth daily.  . furosemide (LASIX) 40 MG tablet Take 1 tablet (40 mg total) by mouth daily.  Marland Kitchen levothyroxine (SYNTHROID) 50 MCG tablet Take 1 tablet (50 mcg total) by mouth daily.  Marland Kitchen losartan (COZAAR) 100 MG tablet Take 1 tablet (100 mg total) by mouth daily.  . meclizine (ANTIVERT) 25 MG tablet Take 1 Tablet by mouth every 8 hours as needed FOR vertigo  . meloxicam (MOBIC) 15 MG tablet TAKE 1 TABLET BY MOUTH ONCE DAILY  . traMADol (ULTRAM) 50 MG tablet Take 1 tablet (50 mg total) by mouth daily. 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 She stay home to take care of her husband most of the time.    Review of Systems    Constitutional: Negative for activity change and appetite change.  HENT: Negative.   Eyes: Negative for pain.  Respiratory: Negative for shortness of breath.   Cardiovascular: Negative for chest pain, palpitations and leg swelling.  Gastrointestinal: Negative for abdominal pain.  Endocrine: Negative for polydipsia.  Genitourinary: Negative.  Negative for dysuria, frequency and urgency.  Skin: Negative for rash.  Neurological: Negative for dizziness, weakness and headaches.  Hematological: Does not bruise/bleed easily.  Psychiatric/Behavioral: Negative.   All other systems reviewed and are negative.      Objective:   Physical Exam Vitals signs and nursing note reviewed.  Constitutional:      General: She is not in acute distress.    Appearance: Normal appearance. She is well-developed.  HENT:     Head: Normocephalic.     Nose: Nose normal.  Eyes:     Pupils: Pupils are equal, round, and reactive to light.  Neck:     Musculoskeletal: Normal range of motion and neck supple.     Vascular: No carotid bruit or JVD.  Cardiovascular:     Rate and Rhythm: Normal rate and regular rhythm.     Heart sounds: Normal heart sounds.  Pulmonary:     Effort: Pulmonary effort is normal. No respiratory distress.     Breath sounds: Normal breath sounds.  No wheezing or rales.  Chest:     Chest wall: No tenderness.  Abdominal:     General: Bowel sounds are normal. There is no distension or abdominal bruit.     Palpations: Abdomen is soft. There is no hepatomegaly, splenomegaly, mass or pulsatile mass.     Tenderness: There is no abdominal tenderness.  Musculoskeletal: Normal range of motion.     Right lower leg: Edema (2+ edema ) present.     Left lower leg: Edema (1+ edema) present.  Lymphadenopathy:     Cervical: No cervical adenopathy.  Skin:    General: Skin is warm and dry.  Neurological:     Mental Status: She is alert and oriented to person, place, and time.     Deep Tendon  Reflexes: Reflexes are normal and symmetric.  Psychiatric:        Behavior: Behavior normal.        Thought Content: Thought content normal.        Judgment: Judgment normal.    BP 136/75   Pulse 78   Temp (!) 97 F (36.1 C) (Oral)   Ht 5' 6"  (1.676 m)   Wt 225 lb (102.1 kg)   BMI 36.32 kg/m    UA- clear      Assessment & Plan:  SURABHI GADEA comes in today with chief complaint of Medical Management of Chronic Issues (foul smelling urine)   Diagnosis and orders addressed:  1. Foul smelling urine Force fluids - Urinalysis, Complete  2. Essential hypertension, benign Low sodium diet - CMP14+EGFR  3. Hypothyroidism due to acquired atrophy of thyroid - Thyroid Panel With TSH  4. Primary osteoarthritis of both hands Exercise hands - traMADol (ULTRAM) 50 MG tablet; Take 1 tablet (50 mg total) by mouth daily. tramadol 50 mg tablet  Dispense: 30 tablet; Refill: 2  5. CKD (chronic kidney disease) stage 3, GFR 30-59 ml/min (HCC) Labs pending   6. Peripheral edema Elevate legs when sitting - furosemide (LASIX) 40 MG tablet; Take 1 tablet (40 mg total) by mouth daily.  Dispense: 90 tablet; Refill: 1  7. Mixed hyperlipidemia Low fat diet - atorvastatin (LIPITOR) 40 MG tablet; Take 1 tablet (40 mg total) by mouth daily.  Dispense: 90 tablet; Refill: 1 - Lipid panel  8. BMI 37.0-37.9, adult Discussed diet and exercise for person with BMI >25 Will recheck weight in 3-6 months    Labs pending Health Maintenance reviewed Diet and exercise encouraged  Follow up plan: 3 months   Mary-Margaret Hassell Done, FNP

## 2018-06-24 LAB — CMP14+EGFR
ALT: 21 IU/L (ref 0–32)
AST: 13 IU/L (ref 0–40)
Albumin/Globulin Ratio: 1.4 (ref 1.2–2.2)
Albumin: 4 g/dL (ref 3.5–4.8)
Alkaline Phosphatase: 120 IU/L — ABNORMAL HIGH (ref 39–117)
BUN/Creatinine Ratio: 10 — ABNORMAL LOW (ref 12–28)
BUN: 15 mg/dL (ref 8–27)
Bilirubin Total: 0.8 mg/dL (ref 0.0–1.2)
CO2: 22 mmol/L (ref 20–29)
Calcium: 9.3 mg/dL (ref 8.7–10.3)
Chloride: 106 mmol/L (ref 96–106)
Creatinine, Ser: 1.43 mg/dL — ABNORMAL HIGH (ref 0.57–1.00)
GFR calc Af Amer: 43 mL/min/{1.73_m2} — ABNORMAL LOW (ref >59)
GFR calc non Af Amer: 37 mL/min/{1.73_m2} — ABNORMAL LOW (ref >59)
Globulin, Total: 2.8 g/dL (ref 1.5–4.5)
Glucose: 86 mg/dL (ref 65–99)
Potassium: 4.7 mmol/L (ref 3.5–5.2)
Sodium: 144 mmol/L (ref 134–144)
Total Protein: 6.8 g/dL (ref 6.0–8.5)

## 2018-06-24 LAB — THYROID PANEL WITH TSH
Free Thyroxine Index: 2 (ref 1.2–4.9)
T3 UPTAKE RATIO: 27 % (ref 24–39)
T4, Total: 7.5 ug/dL (ref 4.5–12.0)
TSH: 3.72 u[IU]/mL (ref 0.450–4.500)

## 2018-06-24 LAB — LIPID PANEL
Chol/HDL Ratio: 4 ratio (ref 0.0–4.4)
Cholesterol, Total: 136 mg/dL (ref 100–199)
HDL: 34 mg/dL — ABNORMAL LOW (ref >39)
LDL Calculated: 68 mg/dL (ref 0–99)
TRIGLYCERIDES: 169 mg/dL — AB (ref 0–149)
VLDL Cholesterol Cal: 34 mg/dL (ref 5–40)

## 2018-07-05 DIAGNOSIS — Z85828 Personal history of other malignant neoplasm of skin: Secondary | ICD-10-CM | POA: Diagnosis not present

## 2018-07-05 DIAGNOSIS — D3617 Benign neoplasm of peripheral nerves and autonomic nervous system of trunk, unspecified: Secondary | ICD-10-CM | POA: Diagnosis not present

## 2018-07-05 DIAGNOSIS — L570 Actinic keratosis: Secondary | ICD-10-CM | POA: Diagnosis not present

## 2018-07-05 DIAGNOSIS — L821 Other seborrheic keratosis: Secondary | ICD-10-CM | POA: Diagnosis not present

## 2018-07-05 DIAGNOSIS — L820 Inflamed seborrheic keratosis: Secondary | ICD-10-CM | POA: Diagnosis not present

## 2018-07-05 DIAGNOSIS — D485 Neoplasm of uncertain behavior of skin: Secondary | ICD-10-CM | POA: Diagnosis not present

## 2018-07-07 ENCOUNTER — Other Ambulatory Visit: Payer: Self-pay

## 2018-07-07 DIAGNOSIS — I83891 Varicose veins of right lower extremities with other complications: Secondary | ICD-10-CM | POA: Diagnosis not present

## 2018-07-07 DIAGNOSIS — E034 Atrophy of thyroid (acquired): Secondary | ICD-10-CM

## 2018-07-07 DIAGNOSIS — I872 Venous insufficiency (chronic) (peripheral): Secondary | ICD-10-CM | POA: Diagnosis not present

## 2018-07-07 MED ORDER — LEVOTHYROXINE SODIUM 50 MCG PO TABS: 50.0000 ug | ORAL_TABLET | Freq: Every day | ORAL | 11 refills | Status: DC

## 2018-07-14 ENCOUNTER — Other Ambulatory Visit: Payer: Self-pay | Admitting: Nurse Practitioner

## 2018-07-14 DIAGNOSIS — R42 Dizziness and giddiness: Secondary | ICD-10-CM

## 2018-07-21 DIAGNOSIS — I83891 Varicose veins of right lower extremities with other complications: Secondary | ICD-10-CM | POA: Diagnosis not present

## 2018-07-21 DIAGNOSIS — I872 Venous insufficiency (chronic) (peripheral): Secondary | ICD-10-CM | POA: Diagnosis not present

## 2018-08-18 DIAGNOSIS — I83891 Varicose veins of right lower extremities with other complications: Secondary | ICD-10-CM | POA: Diagnosis not present

## 2018-08-18 DIAGNOSIS — I872 Venous insufficiency (chronic) (peripheral): Secondary | ICD-10-CM | POA: Diagnosis not present

## 2018-09-22 ENCOUNTER — Encounter: Payer: Self-pay | Admitting: Nurse Practitioner

## 2018-09-22 ENCOUNTER — Ambulatory Visit (INDEPENDENT_AMBULATORY_CARE_PROVIDER_SITE_OTHER): Payer: Medicare Other | Admitting: Nurse Practitioner

## 2018-09-22 ENCOUNTER — Other Ambulatory Visit: Payer: Self-pay

## 2018-09-22 DIAGNOSIS — M19041 Primary osteoarthritis, right hand: Secondary | ICD-10-CM | POA: Diagnosis not present

## 2018-09-22 DIAGNOSIS — E034 Atrophy of thyroid (acquired): Secondary | ICD-10-CM

## 2018-09-22 DIAGNOSIS — E782 Mixed hyperlipidemia: Secondary | ICD-10-CM

## 2018-09-22 DIAGNOSIS — Z6837 Body mass index (BMI) 37.0-37.9, adult: Secondary | ICD-10-CM

## 2018-09-22 DIAGNOSIS — N183 Chronic kidney disease, stage 3 unspecified: Secondary | ICD-10-CM

## 2018-09-22 DIAGNOSIS — M19042 Primary osteoarthritis, left hand: Secondary | ICD-10-CM | POA: Diagnosis not present

## 2018-09-22 DIAGNOSIS — I129 Hypertensive chronic kidney disease with stage 1 through stage 4 chronic kidney disease, or unspecified chronic kidney disease: Secondary | ICD-10-CM | POA: Diagnosis not present

## 2018-09-22 DIAGNOSIS — R609 Edema, unspecified: Secondary | ICD-10-CM | POA: Diagnosis not present

## 2018-09-22 DIAGNOSIS — I10 Essential (primary) hypertension: Secondary | ICD-10-CM

## 2018-09-22 MED ORDER — TRAMADOL HCL 50 MG PO TABS: 50.0000 mg | ORAL_TABLET | Freq: Every day | ORAL | 2 refills | Status: DC

## 2018-09-22 NOTE — Progress Notes (Signed)
Patient ID: Kimberly Walsh, female   DOB: [birth year], 73 y.o.   MRN: [id number]    Virtual Visit via telephone Note  I connected with Kimberly Walsh on 09/22/18 at 11:55 AM by telephone and verified that I am speaking with the correct person using two identifiers. [name] is currently located at home and no one is currently with her during visit. The provider, Kimberly Hassell Done, FNP is located in their office at time of visit.  I discussed the limitations, risks, security and privacy concerns of performing an evaluation and management service by telephone and the availability of in person appointments. I also discussed with the patient that there may be a patient responsible charge related to this service. The patient expressed understanding and agreed to proceed.   History and Present Illness:   Chief Complaint: medical management of chronic issues  HPI:  1. Essential hypertension, benign  No c/o chest pain, sob or headache. Does not check blood pressure at home. BP Readings from Last 3 Encounters:  06/23/18 136/75  03/21/18 133/80  03/04/18 (!) 148/74     2. Mixed hyperlipidemia  Tries to watch diet. Does not do much exercise  3. Hypothyroidism due to acquired atrophy of thyroid  No problems that she is aware of.  4. CKD (chronic kidney disease) stage 3, GFR 30-59 ml/min (HCC)  Last creatine was 1.43 which was an improvement from 1.51  5. Primary osteoarthritis of both hands  Has lots of pain in hands with some deformity of fingers  6. Peripheral edema  Has daily and wears compression socks.  7. BMI 37.0-37.9, adult  No recent weight changes    Outpatient Encounter Medications as of 09/22/2018  Medication Sig  . atorvastatin (LIPITOR) 40 MG tablet Take 1 tablet (40 mg total) by mouth daily.  . furosemide (LASIX) 40 MG tablet Take 1 tablet (40 mg total) by mouth daily.  Marland Kitchen levothyroxine (SYNTHROID) 50 MCG tablet Take 1 tablet (50 mcg total) by mouth daily.  Marland Kitchen  losartan (COZAAR) 100 MG tablet Take 1 tablet (100 mg total) by mouth daily.  . meclizine (ANTIVERT) 25 MG tablet TAKE ONE TABLET BY MOUTH EVERY 6 HOURS AS NEEDED FOR VERTIGO  . meloxicam (MOBIC) 15 MG tablet TAKE 1 TABLET BY MOUTH ONCE DAILY  . traMADol (ULTRAM) 50 MG tablet Take 1 tablet (50 mg total) by mouth daily. tramadol 50 mg tablet       New complaints: None today  Social history: Staying in house due to covid 19  Review of Systems  Constitutional: Negative for diaphoresis and weight loss.  Eyes: Negative for blurred vision, double vision and pain.  Respiratory: Negative for shortness of breath.   Cardiovascular: Negative for chest pain, palpitations, orthopnea and leg swelling.  Gastrointestinal: Negative for abdominal pain.  Skin: Negative for rash.  Neurological: Negative for dizziness, sensory change, loss of consciousness, weakness and headaches.  Endo/Heme/Allergies: Negative for polydipsia. Does not bruise/bleed easily.  Psychiatric/Behavioral: Negative for memory loss. The patient does not have insomnia.   All other systems reviewed and are negative.    Observations/Objective: Alert and oriented- answers all questions appropriately  Assessment and Plan: KANDAS OLIVETO comes in today with chief complaint of No chief complaint on file.   Diagnosis and orders addressed:  1. Essential hypertension, benign Low sodium diet  2. Mixed hyperlipidemia Watch fat sin diet  3. Hypothyroidism due to acquired atrophy of thyroid  4. CKD (chronic kidney disease) stage 3, GFR 30-59  ml/min [facility]) Will check kidney function when office opens back up for regular business  5. Primary osteoarthritis of both hands Take warm baths - traMADol (ULTRAM) 50 MG tablet; Take 1 tablet (50 mg total) by mouth daily. tramadol 50 mg tablet  Dispense: 30 tablet; Refill: 2  6. Peripheral edema Elevate legs when sitting  Continue to elevate legs when sittting  7. BMI 37.0-37.9,  adult Discussed diet and exercise for person with BMI >25 Will recheck weight in 3-6 months    Previous lab results reviewed Health Maintenance reviewed Diet and exercise encouraged  Follow Up Instructions: 3 months    I discussed the assessment and treatment plan with the patient. The patient was provided an opportunity to ask questions and all were answered. The patient agreed with the plan and demonstrated an understanding of the instructions.   The patient was advised to call back or seek an in-person evaluation if the symptoms worsen or if the condition fails to improve as anticipated.  The above assessment and management plan was discussed with the patient. The patient verbalized understanding of and has agreed to the management plan. Patient is aware to call the clinic if symptoms persist or worsen. Patient is aware when to return to the clinic for a follow-up visit. Patient educated on when it is appropriate to go to the emergency department.    I provided 13 minutes of non-face-to-face time during this encounter.    Kimberly Hassell Done, FNP

## 2018-11-17 DIAGNOSIS — I83891 Varicose veins of right lower extremities with other complications: Secondary | ICD-10-CM | POA: Diagnosis not present

## 2018-11-17 DIAGNOSIS — I83892 Varicose veins of left lower extremities with other complications: Secondary | ICD-10-CM | POA: Diagnosis not present

## 2018-11-17 DIAGNOSIS — I872 Venous insufficiency (chronic) (peripheral): Secondary | ICD-10-CM | POA: Diagnosis not present

## 2018-12-11 DIAGNOSIS — G459 Transient cerebral ischemic attack, unspecified: Secondary | ICD-10-CM | POA: Diagnosis present

## 2018-12-11 DIAGNOSIS — H8110 Benign paroxysmal vertigo, unspecified ear: Secondary | ICD-10-CM | POA: Diagnosis present

## 2018-12-11 DIAGNOSIS — R51 Headache: Secondary | ICD-10-CM | POA: Diagnosis not present

## 2018-12-11 DIAGNOSIS — R402240 Coma scale, best verbal response, confused conversation, unspecified time: Secondary | ICD-10-CM | POA: Diagnosis not present

## 2018-12-11 DIAGNOSIS — N189 Chronic kidney disease, unspecified: Secondary | ICD-10-CM | POA: Diagnosis not present

## 2018-12-11 DIAGNOSIS — R4182 Altered mental status, unspecified: Secondary | ICD-10-CM | POA: Diagnosis not present

## 2018-12-11 DIAGNOSIS — E873 Alkalosis: Secondary | ICD-10-CM | POA: Diagnosis present

## 2018-12-11 DIAGNOSIS — Z049 Encounter for examination and observation for unspecified reason: Secondary | ICD-10-CM | POA: Diagnosis not present

## 2018-12-11 DIAGNOSIS — G9340 Encephalopathy, unspecified: Secondary | ICD-10-CM | POA: Diagnosis present

## 2018-12-11 DIAGNOSIS — R202 Paresthesia of skin: Secondary | ICD-10-CM | POA: Diagnosis not present

## 2018-12-11 DIAGNOSIS — R402350 Coma scale, best motor response, localizes pain, unspecified time: Secondary | ICD-10-CM | POA: Diagnosis present

## 2018-12-11 DIAGNOSIS — E785 Hyperlipidemia, unspecified: Secondary | ICD-10-CM | POA: Diagnosis present

## 2018-12-11 DIAGNOSIS — E871 Hypo-osmolality and hyponatremia: Secondary | ICD-10-CM | POA: Diagnosis not present

## 2018-12-11 DIAGNOSIS — Z7982 Long term (current) use of aspirin: Secondary | ICD-10-CM | POA: Diagnosis not present

## 2018-12-11 DIAGNOSIS — N179 Acute kidney failure, unspecified: Secondary | ICD-10-CM | POA: Diagnosis not present

## 2018-12-11 DIAGNOSIS — R402140 Coma scale, eyes open, spontaneous, unspecified time: Secondary | ICD-10-CM | POA: Diagnosis present

## 2018-12-11 DIAGNOSIS — R918 Other nonspecific abnormal finding of lung field: Secondary | ICD-10-CM | POA: Diagnosis not present

## 2018-12-11 DIAGNOSIS — E876 Hypokalemia: Secondary | ICD-10-CM | POA: Diagnosis present

## 2018-12-11 DIAGNOSIS — I440 Atrioventricular block, first degree: Secondary | ICD-10-CM | POA: Diagnosis not present

## 2018-12-11 DIAGNOSIS — R2990 Unspecified symptoms and signs involving the nervous system: Secondary | ICD-10-CM | POA: Diagnosis not present

## 2018-12-11 DIAGNOSIS — E039 Hypothyroidism, unspecified: Secondary | ICD-10-CM | POA: Diagnosis present

## 2018-12-11 DIAGNOSIS — G454 Transient global amnesia: Secondary | ICD-10-CM | POA: Diagnosis present

## 2018-12-11 DIAGNOSIS — I129 Hypertensive chronic kidney disease with stage 1 through stage 4 chronic kidney disease, or unspecified chronic kidney disease: Secondary | ICD-10-CM | POA: Diagnosis not present

## 2018-12-12 DIAGNOSIS — R402350 Coma scale, best motor response, localizes pain, unspecified time: Secondary | ICD-10-CM | POA: Diagnosis not present

## 2018-12-12 DIAGNOSIS — G9340 Encephalopathy, unspecified: Secondary | ICD-10-CM | POA: Diagnosis not present

## 2018-12-12 DIAGNOSIS — R4182 Altered mental status, unspecified: Secondary | ICD-10-CM | POA: Diagnosis not present

## 2018-12-12 DIAGNOSIS — G459 Transient cerebral ischemic attack, unspecified: Secondary | ICD-10-CM | POA: Diagnosis not present

## 2018-12-12 DIAGNOSIS — E876 Hypokalemia: Secondary | ICD-10-CM | POA: Diagnosis not present

## 2018-12-12 DIAGNOSIS — G454 Transient global amnesia: Secondary | ICD-10-CM | POA: Diagnosis not present

## 2018-12-12 DIAGNOSIS — E873 Alkalosis: Secondary | ICD-10-CM | POA: Diagnosis not present

## 2018-12-12 DIAGNOSIS — I440 Atrioventricular block, first degree: Secondary | ICD-10-CM | POA: Diagnosis not present

## 2018-12-21 ENCOUNTER — Telehealth: Payer: Self-pay

## 2018-12-21 ENCOUNTER — Ambulatory Visit: Payer: Medicare Other

## 2018-12-21 DIAGNOSIS — I10 Essential (primary) hypertension: Secondary | ICD-10-CM | POA: Diagnosis not present

## 2018-12-21 DIAGNOSIS — E785 Hyperlipidemia, unspecified: Secondary | ICD-10-CM | POA: Diagnosis not present

## 2018-12-21 DIAGNOSIS — R51 Headache: Secondary | ICD-10-CM | POA: Diagnosis not present

## 2018-12-21 DIAGNOSIS — R200 Anesthesia of skin: Secondary | ICD-10-CM | POA: Diagnosis not present

## 2018-12-21 DIAGNOSIS — E039 Hypothyroidism, unspecified: Secondary | ICD-10-CM | POA: Diagnosis not present

## 2018-12-21 DIAGNOSIS — R55 Syncope and collapse: Secondary | ICD-10-CM | POA: Diagnosis not present

## 2018-12-21 DIAGNOSIS — Z79899 Other long term (current) drug therapy: Secondary | ICD-10-CM | POA: Diagnosis not present

## 2018-12-21 DIAGNOSIS — I639 Cerebral infarction, unspecified: Secondary | ICD-10-CM | POA: Diagnosis not present

## 2018-12-21 DIAGNOSIS — R202 Paresthesia of skin: Secondary | ICD-10-CM | POA: Diagnosis not present

## 2018-12-21 DIAGNOSIS — Z7982 Long term (current) use of aspirin: Secondary | ICD-10-CM | POA: Diagnosis not present

## 2018-12-21 DIAGNOSIS — I2510 Atherosclerotic heart disease of native coronary artery without angina pectoris: Secondary | ICD-10-CM | POA: Diagnosis not present

## 2018-12-21 NOTE — Telephone Encounter (Signed)
Toes felt numb today. Just doesn't feel good and wants to be seen.

## 2018-12-21 NOTE — Telephone Encounter (Signed)
Patient went to ER>

## 2018-12-21 NOTE — Telephone Encounter (Signed)
Error

## 2018-12-22 DIAGNOSIS — I10 Essential (primary) hypertension: Secondary | ICD-10-CM | POA: Diagnosis not present

## 2018-12-22 DIAGNOSIS — I501 Left ventricular failure: Secondary | ICD-10-CM | POA: Diagnosis not present

## 2018-12-22 DIAGNOSIS — E785 Hyperlipidemia, unspecified: Secondary | ICD-10-CM | POA: Diagnosis not present

## 2018-12-22 DIAGNOSIS — H8110 Benign paroxysmal vertigo, unspecified ear: Secondary | ICD-10-CM | POA: Diagnosis not present

## 2018-12-22 DIAGNOSIS — I517 Cardiomegaly: Secondary | ICD-10-CM | POA: Diagnosis not present

## 2018-12-22 DIAGNOSIS — E039 Hypothyroidism, unspecified: Secondary | ICD-10-CM | POA: Diagnosis not present

## 2018-12-22 DIAGNOSIS — R51 Headache: Secondary | ICD-10-CM | POA: Diagnosis not present

## 2018-12-22 DIAGNOSIS — I639 Cerebral infarction, unspecified: Secondary | ICD-10-CM | POA: Diagnosis not present

## 2018-12-22 DIAGNOSIS — I348 Other nonrheumatic mitral valve disorders: Secondary | ICD-10-CM | POA: Diagnosis not present

## 2018-12-22 DIAGNOSIS — R200 Anesthesia of skin: Secondary | ICD-10-CM | POA: Diagnosis not present

## 2018-12-22 DIAGNOSIS — R202 Paresthesia of skin: Secondary | ICD-10-CM | POA: Diagnosis not present

## 2018-12-22 DIAGNOSIS — I459 Conduction disorder, unspecified: Secondary | ICD-10-CM | POA: Diagnosis not present

## 2018-12-23 DIAGNOSIS — I639 Cerebral infarction, unspecified: Secondary | ICD-10-CM | POA: Diagnosis not present

## 2018-12-23 DIAGNOSIS — H8110 Benign paroxysmal vertigo, unspecified ear: Secondary | ICD-10-CM | POA: Diagnosis not present

## 2018-12-23 DIAGNOSIS — E039 Hypothyroidism, unspecified: Secondary | ICD-10-CM | POA: Diagnosis not present

## 2018-12-23 DIAGNOSIS — R200 Anesthesia of skin: Secondary | ICD-10-CM | POA: Diagnosis not present

## 2018-12-23 DIAGNOSIS — I10 Essential (primary) hypertension: Secondary | ICD-10-CM | POA: Diagnosis not present

## 2018-12-23 DIAGNOSIS — E785 Hyperlipidemia, unspecified: Secondary | ICD-10-CM | POA: Diagnosis not present

## 2018-12-27 ENCOUNTER — Other Ambulatory Visit: Payer: Self-pay

## 2018-12-27 ENCOUNTER — Ambulatory Visit (INDEPENDENT_AMBULATORY_CARE_PROVIDER_SITE_OTHER): Payer: Medicare Other | Admitting: Nurse Practitioner

## 2018-12-27 ENCOUNTER — Encounter: Payer: Self-pay | Admitting: Nurse Practitioner

## 2018-12-27 VITALS — BP 119/64 | HR 76 | Temp 97.7°F | Ht 66.0 in | Wt 206.0 lb

## 2018-12-27 DIAGNOSIS — I6310 Cerebral infarction due to embolism of unspecified precerebral artery: Secondary | ICD-10-CM | POA: Diagnosis not present

## 2018-12-27 DIAGNOSIS — I6930 Unspecified sequelae of cerebral infarction: Secondary | ICD-10-CM | POA: Diagnosis not present

## 2018-12-27 DIAGNOSIS — Z09 Encounter for follow-up examination after completed treatment for conditions other than malignant neoplasm: Secondary | ICD-10-CM

## 2018-12-27 LAB — COAGUCHEK XS/INR WAIVED
INR: 1 (ref 0.9–1.1)
Prothrombin Time: 12 s

## 2018-12-27 MED ORDER — WARFARIN SODIUM 2 MG PO TABS: 2.0000 mg | ORAL_TABLET | Freq: Every day | ORAL | 3 refills | Status: DC

## 2018-12-27 MED ORDER — ENOXAPARIN SODIUM 150 MG/ML ~~LOC~~ SOLN: 1.0000 mg/kg | Freq: Two times a day (BID) | SUBCUTANEOUS | 0 refills | Status: DC

## 2018-12-27 NOTE — Patient Instructions (Signed)
Prothrombin Time, International Normalized Ratio Test Why am I having this test? A prothrombin time (pro-time, PT) test may be ordered if:  You have certain medical conditions that cause abnormal bleeding or blood clotting. These can include: ? Liver disease. ? Systemic infection (sepsis). ? Inherited (genetic) bleeding disorders.  You are taking a medicine to prevent excessive blood clotting (anticoagulant), such as warfarin. ? If you are taking warfarin, you will likely be asked to have this test done at regular intervals. The results of this test will help your health care provider determine what dose of warfarin you need based on how quickly or slowly your blood clots. It is very important to have this test done as often as your health care provider recommends. What is being tested? A prothrombin time (pro-time, PT) test measures how many seconds it takes your blood to clot. The international normalized ratio (INR) is a calculation of blood clotting time based on your PT result. Most labs report both PT and INR values when reporting blood clotting times. What kind of sample is taken?  A blood sample is required for this test. It is usually collected by inserting a needle into a blood vessel. Tell a health care provider about:  Any blood disorders you have.  All medicines you are taking, including vitamins, herbs, eye drops, creams, and over-the-counter medicines. Do not stop, add, or change any medicines without letting your health care provider know.  The foods you regularly eat, especially foods that contain moderate or high amounts of vitamin K. It is important to eat a consistent amount of foods rich in vitamin K. Let your health care provider know if you have recently changed your diet.  If you drink alcohol. This can affect your lab results. How are the results reported? Your test results will be reported as values. Your health care provider will compare your results to normal  ranges that were established after testing a large group of people (reference ranges). Reference ranges may vary among different labs and hospitals. For this test, common reference ranges are:  Without anticoagulant treatment (control value): 11.0-12.5 seconds; 85-100%.  INR: 0.8-1.1. If you are taking warfarin, talk with your health care provider about what your INR result should be. Generally, an INR of 2.0-3.0 is desired for blood clot prevention. This depends on your medical conditions. What do the results mean?  A higher than normal PT or INR means that your blood takes longer to form a clot. This can result from: ? Certain medicines. ? Liver disease. ? Lack of certain proteins that form clots (coagulation factors). ? Lack of some vitamins.  A lower than normal PT or INR means that your blood can form a clot easily. This can result from: ? Supplements that contain Vitamin K. ? Medicines that contain estrogen, such as birth control pills or hormone replacement. ? Cancer. ? Some blood disorders (disseminated intravascular coagulation). Talk with your health care provider about what your test results mean. If you are taking warfarin or another anticoagulant, your result ranges may be different. Talk to your health care provider about what your results should be. Questions to ask your health care provider Ask your health care provider or the department that is doing the test:  When will my results be ready?  How will I get my results?  What are my treatment options?  What other tests do I need?  What are my next steps? Summary  A prothrombin time (pro-time, PT) test measures how  many seconds it takes your blood to clot.  You may have this test if you have a medical condition that causes abnormal bleeding or blood clotting, or if you are taking a medicine to prevent abnormal blood clotting.  A test result that is higher than normal indicates that your blood is taking too long  to form a clot. This result may occur because you lack some vitamins, take certain medicines, or have certain medical conditions.  Talk with your health care provider about what your results mean. This information is not intended to replace advice given to you by your health care provider. Make sure you discuss any questions you have with your health care provider. Document Released: 07/11/2004 Document Revised: 07/24/2017 Document Reviewed: 07/24/2017 Elsevier Patient Education  2020 [employer]. Bleeding Precautions When on Anticoagulant Therapy, Adult Anticoagulant therapy, also called blood thinner therapy, is medicine that helps to prevent and treat blood clots. The medicine works by stopping blood clots from forming or growing. Blood clots that form in your blood vessels can be dangerous. They can break loose and travel to the heart, lungs, or brain. This increases the risk of a heart attack, stroke, or blocked lung artery (pulmonary embolism). Anticoagulants also increase the risk of bleeding. Try to protect yourself from cuts and other injuries that can cause bleeding. It is important to take anticoagulants exactly as told by your health care provider. Why do I need to be on anticoagulant therapy? You may need this medicine if you are at risk of developing a blood clot. Conditions that increase your risk of a blood clot include:  Being born with heart disease or a heart malformation (congenital heart disease).  Developing heart disease.  Having had surgery, such as valve replacement.  Having had a serious accident or other type of severe injury (trauma).  Having certain types of cancer.  Having certain diseases that can increase blood clotting.  Having a high risk of stroke or heart attack.  Having atrial fibrillation (AF). What are the common anticoagulant medicines? There are several types of anticoagulant medicines. The most common types are:  Medicines that you take by  mouth (oral medicines), such as: ? Warfarin. ? Novel oral anticoagulants (NOACs), such as: ? Direct thrombin inhibitors (dabigatran). ? Factor Xa inhibitors (apixaban, edoxaban, and rivaroxaban).  Injections, such as: ? Unfractionated heparin. ? Low molecular weight heparin. These anticoagulants work in different ways to prevent blood clots. They also have different risks and side effects. What do I need to remember while on anticoagulant therapy? Taking anticoagulants  Take your medicine at the same time every day. If you forget to take your medicine, take it as soon as you remember. Do not double your dosage of medicine if you miss a whole day. Take your normal dose and call your health care provider.  Do not stop taking your medicine unless your health care provider approves. Stopping the medicine can increase your risk of developing a blood clot. Taking other medicines  Take over-the-counter and prescriptions medicines only as told by your health care provider.  Do not take over-the-counter NSAIDs, including aspirin and ibuprofen, while you are on anticoagulant therapy. These medicines increase your risk of dangerous bleeding.  Get approval from your health care provider before you start taking any new medicines, vitamins, or herbal products. Some of these could interfere with your therapy. General instructions  Keep all follow-up visits as told by your health care provider. This is important.  If you are pregnant  or trying to get pregnant, talk with a health care provider about anticoagulants. Some of these medicines are not safe to take during pregnancy.  Tell all health care providers, including your dentist, that you are on anticoagulant therapy. It is especially important to tell providers before you have any surgery, medical procedures, or dental work done. What precautions should I take?   Be very careful when using knives, scissors, or other sharp objects.  Use an  electric razor instead of a blade.  Do not use toothpicks.  Use a soft-bristled toothbrush. Brush your teeth gently.  Always wear shoes outdoors and wear slippers indoors.  Be careful when cutting your fingernails and toenails.  Place bath mats in the bathroom. If possible, install handrails as well.  Wear gloves while you do yard work.  Wear your seat belt.  Prevent falls by removing loose rugs and extension cords from areas where you walk. Use a cane or walker if you need it.  Avoid constipation by: ? Drinking enough fluid to keep your urine clear or pale yellow. ? Eating foods that are high in fiber, such as fresh fruits and vegetables, whole grains, and beans. ? Limiting foods that are high in fat and processed sugars, such as fried and sweet foods.  Do not play contact sports or participate in other activities that have a high risk for injury. What other precautions are important if on warfarin therapy? If you are taking a type of anticoagulant called warfarin, make sure you:  Work with a diet and nutrition specialist (dietitian) to make an eating plan. Do not make any sudden changes to your diet after you have started your eating plan.  Do not drink alcohol. It can interfere with your medicine and increase your risk of an injury that causes bleeding.  Get regular blood tests as told by your health care provider. What are some questions to ask my health care provider?  Why do I need anticoagulant therapy?  What is the best anticoagulant therapy for my condition?  How long will I need anticoagulant therapy?  What are the side effects of anticoagulant therapy?  When should I take my medicine? What should I do if I forget to take it?  Will I need to have regular blood tests?  Do I need to change my diet? Are there foods or drinks that I should avoid?  What activities are safe for me?  What should I do if I want to get pregnant? Contact a health care provider if:   You miss a dose of medicine: ? And you are not sure what to do. ? For more than one day.  You have: ? Menstrual bleeding that is heavier than normal. ? Bloody or brown urine. ? Easy bruising. ? Black and tarry stool or bright red stool. ? Side effects from your medicine.  You feel weak or dizzy.  You become pregnant. Get help right away if:  You have bleeding that will not stop within 20 minutes from: ? The nose. ? The gums. ? A cut on the skin.  You have a severe headache or stomachache.  You vomit or cough up blood.  You fall or hit your head. Summary  Anticoagulant therapy, also called blood thinner therapy, is medicine that helps to prevent and treat blood clots.  Anticoagulants work in different ways to prevent blood clots. They also have different risks and side effects.  Talk with your health care provider about any precautions that you should  take while on anticoagulant therapy. This information is not intended to replace advice given to you by your health care provider. Make sure you discuss any questions you have with your health care provider. Document Released: 05/20/2015 Document Revised: 09/28/2018 Document Reviewed: 08/25/2016 Elsevier Patient Education  [place].

## 2018-12-27 NOTE — Progress Notes (Signed)
Subjective:    Patient ID: Kimberly Walsh, female    DOB: Sep 14, 1945, 73 y.o.   MRN: [id number]   Chief Complaint: Hospitalization Follow-up   HPI Patient comes in today for hospital follow up. She went to hospital 12/10/17 with stroke like symptoms. She was discharged home. She went back to the ER on 12/21/18 with altered mental status. She also had left side facial drooping and right sided foot numbness. These symptoms did not last very long. They increased her dose of aspirin and started her on coumadin. They started her on lovenox injection but did  Not give her any to take at home. She was suppose to start on coumadin from [place]. She has not had anything since last Friday. Since stroke was valvular she has to go on coumadin. Patient says she feels better and has no residual effect.    Review of Systems  Constitutional: Negative for activity change and appetite change.  HENT: Negative.   Eyes: Negative for pain.  Respiratory: Negative for shortness of breath.   Cardiovascular: Negative for chest pain, palpitations and leg swelling.  Gastrointestinal: Negative for abdominal pain.  Endocrine: Negative for polydipsia.  Genitourinary: Negative.   Skin: Negative for rash.  Neurological: Negative for dizziness, weakness and headaches.  Hematological: Does not bruise/bleed easily.  Psychiatric/Behavioral: Negative.   All other systems reviewed and are negative.      Objective:   Physical Exam Vitals signs and nursing note reviewed.  Constitutional:      General: She is not in acute distress.    Appearance: Normal appearance. She is well-developed.  HENT:     Head: Normocephalic.     Nose: Nose normal.  Eyes:     Pupils: Pupils are equal, round, and reactive to light.  Neck:     Musculoskeletal: Normal range of motion and neck supple.     Vascular: No carotid bruit or JVD.  Cardiovascular:     Rate and Rhythm: Normal rate and regular rhythm.     Heart sounds: Normal heart  sounds.  Pulmonary:     Effort: Pulmonary effort is normal. No respiratory distress.     Breath sounds: Normal breath sounds. No wheezing or rales.  Chest:     Chest wall: No tenderness.  Abdominal:     General: Bowel sounds are normal. There is no distension or abdominal bruit.     Palpations: Abdomen is soft. There is no hepatomegaly, splenomegaly, mass or pulsatile mass.     Tenderness: There is no abdominal tenderness.  Musculoskeletal: Normal range of motion.  Lymphadenopathy:     Cervical: No cervical adenopathy.  Skin:    General: Skin is warm and dry.  Neurological:     General: No focal deficit present.     Mental Status: She is alert and oriented to person, place, and time. Mental status is at baseline.     Cranial Nerves: No cranial nerve deficit.     Sensory: No sensory deficit.     Deep Tendon Reflexes: Reflexes are normal and symmetric.  Psychiatric:        Behavior: Behavior normal.        Thought Content: Thought content normal.        Judgment: Judgment normal.    BP 119/64   Pulse 76   Temp 97.7 F (36.5 C) (Oral)   Ht 5\' 6"  (1.676 m)   Wt 206 lb (93.4 kg)   BMI 33.25 kg/m  Assessment & Plan:  [name] in today with chief complaint of Hospitalization Follow-up   1. Cerebrovascular accident (CVA) due to embolism of precerebral artery ([place]) Starting on lovenox injections BID until seen on Tuesday Will also take coumadin 2mg  daily Will check INR on Tuesday If have any bleeding let me know immediately- enoxaparin (LOVENOX) 150 MG/ML injection; Inject 0.62 mLs (95 mg total) into the skin every 12 (twelve) hours.  Dispense: 11 mL; Refill: 0 - warfarin (COUMADIN) 2 MG tablet; Take 1 tablet (2 mg total) by mouth daily.  Dispense: 30 tablet; Refill: 3   2. Hospital follow up Hospital records reviewed  [place], [place]

## 2018-12-27 NOTE — Addendum Note (Signed)
Addended by: Chevis Pretty on: 12/27/2018 04:15 PM   Modules accepted: Orders

## 2019-01-02 ENCOUNTER — Other Ambulatory Visit: Payer: Self-pay

## 2019-01-03 ENCOUNTER — Ambulatory Visit (INDEPENDENT_AMBULATORY_CARE_PROVIDER_SITE_OTHER): Payer: Medicare Other | Admitting: Nurse Practitioner

## 2019-01-03 ENCOUNTER — Encounter: Payer: Self-pay | Admitting: Nurse Practitioner

## 2019-01-03 ENCOUNTER — Other Ambulatory Visit: Payer: Self-pay | Admitting: *Deleted

## 2019-01-03 VITALS — BP 124/72 | HR 83 | Temp 98.4°F | Ht 66.0 in | Wt 205.2 lb

## 2019-01-03 DIAGNOSIS — I6310 Cerebral infarction due to embolism of unspecified precerebral artery: Secondary | ICD-10-CM

## 2019-01-03 DIAGNOSIS — I6930 Unspecified sequelae of cerebral infarction: Secondary | ICD-10-CM | POA: Diagnosis not present

## 2019-01-03 LAB — COAGUCHEK XS/INR WAIVED
INR: 1 (ref 0.9–1.1)
Prothrombin Time: 12.1 s

## 2019-01-03 MED ORDER — ENOXAPARIN SODIUM 100 MG/ML ~~LOC~~ SOLN: 100.0000 mg | Freq: Two times a day (BID) | SUBCUTANEOUS | 0 refills | Status: DC

## 2019-01-03 NOTE — Patient Instructions (Signed)
Prothrombin Time, International Normalized Ratio Test Why am I having this test? A prothrombin time (pro-time, PT) test may be ordered if:  You have certain medical conditions that cause abnormal bleeding or blood clotting. These can include: ? Liver disease. ? Systemic infection (sepsis). ? Inherited (genetic) bleeding disorders.  You are taking a medicine to prevent excessive blood clotting (anticoagulant), such as warfarin. ? If you are taking warfarin, you will likely be asked to have this test done at regular intervals. The results of this test will help your health care provider determine what dose of warfarin you need based on how quickly or slowly your blood clots. It is very important to have this test done as often as your health care provider recommends. What is being tested? A prothrombin time (pro-time, PT) test measures how many seconds it takes your blood to clot. The international normalized ratio (INR) is a calculation of blood clotting time based on your PT result. Most labs report both PT and INR values when reporting blood clotting times. What kind of sample is taken?  A blood sample is required for this test. It is usually collected by inserting a needle into a blood vessel. Tell a health care provider about:  Any blood disorders you have.  All medicines you are taking, including vitamins, herbs, eye drops, creams, and over-the-counter medicines. Do not stop, add, or change any medicines without letting your health care provider know.  The foods you regularly eat, especially foods that contain moderate or high amounts of vitamin K. It is important to eat a consistent amount of foods rich in vitamin K. Let your health care provider know if you have recently changed your diet.  If you drink alcohol. This can affect your lab results. How are the results reported? Your test results will be reported as values. Your health care provider will compare your results to normal  ranges that were established after testing a large group of people (reference ranges). Reference ranges may vary among different labs and hospitals. For this test, common reference ranges are:  Without anticoagulant treatment (control value): 11.0-12.5 seconds; 85-100%.  INR: 0.8-1.1. If you are taking warfarin, talk with your health care provider about what your INR result should be. Generally, an INR of 2.0-3.0 is desired for blood clot prevention. This depends on your medical conditions. What do the results mean?  A higher than normal PT or INR means that your blood takes longer to form a clot. This can result from: ? Certain medicines. ? Liver disease. ? Lack of certain proteins that form clots (coagulation factors). ? Lack of some vitamins.  A lower than normal PT or INR means that your blood can form a clot easily. This can result from: ? Supplements that contain Vitamin K. ? Medicines that contain estrogen, such as birth control pills or hormone replacement. ? Cancer. ? Some blood disorders (disseminated intravascular coagulation). Talk with your health care provider about what your test results mean. If you are taking warfarin or another anticoagulant, your result ranges may be different. Talk to your health care provider about what your results should be. Questions to ask your health care provider Ask your health care provider or the department that is doing the test:  When will my results be ready?  How will I get my results?  What are my treatment options?  What other tests do I need?  What are my next steps? Summary  A prothrombin time (pro-time, PT) test measures how  many seconds it takes your blood to clot.  You may have this test if you have a medical condition that causes abnormal bleeding or blood clotting, or if you are taking a medicine to prevent abnormal blood clotting.  A test result that is higher than normal indicates that your blood is taking too long  to form a clot. This result may occur because you lack some vitamins, take certain medicines, or have certain medical conditions.  Talk with your health care provider about what your results mean. This information is not intended to replace advice given to you by your health care provider. Make sure you discuss any questions you have with your health care provider. Document Released: 07/11/2004 Document Revised: 07/24/2017 Document Reviewed: 07/24/2017 Elsevier Patient Education  2020 [employer].

## 2019-01-03 NOTE — Addendum Note (Signed)
Addended by: Earlene Plater on: 01/03/2019 03:36 PM   Modules accepted: Orders

## 2019-01-03 NOTE — Progress Notes (Signed)
Subjective:   Chief Complaint: INR recheck  Patient had 2 mini strokes in the last month and was diagnosed that it was valvular. She wsa seen in office 12/27/18 to nbe started on lovenox tp bridge to coumadin. She is here today for her first INR since starting coumadin.   Indication: CVA Bleeding signs/symptoms: None Thromboembolic signs/symptoms: None  Missed Coumadin doses: None Medication changes: yes - just started on coumadin 12/28/18 Dietary changes: no Bacterial/viral infection: no Other concerns: no  The following portions of the patient's history were reviewed and updated as appropriate: allergies, current medications, past family history, past medical history, past social history, past surgical history and problem list.  Review of Systems Pertinent items noted in HPI and remainder of comprehensive ROS otherwise negative.   Objective:    INR Today: 1.0 Current dose: coumadin 2mg  daily    Assessment:    Subtherapeutic INR for goal of 2-3   Plan:    1. New dose: coumadin 4mg  daily   2. Next INR: 1 week    Mary-Margaret Hassell Done, FNP

## 2019-01-04 ENCOUNTER — Telehealth: Payer: Self-pay | Admitting: *Deleted

## 2019-01-04 DIAGNOSIS — R2990 Unspecified symptoms and signs involving the nervous system: Secondary | ICD-10-CM | POA: Diagnosis not present

## 2019-01-04 NOTE — Telephone Encounter (Signed)
ENOXAPARIN 100MG /ML SYR[facility]E  PA Case: [id number], Status: Approved, Coverage Starts on: 06/22/2018 12:00:00 AM, Coverage Ends on: 06/22/2019 12:00:00 AM.

## 2019-01-04 NOTE — Telephone Encounter (Signed)
Error

## 2019-01-10 ENCOUNTER — Ambulatory Visit (INDEPENDENT_AMBULATORY_CARE_PROVIDER_SITE_OTHER): Payer: Medicare Other | Admitting: Nurse Practitioner

## 2019-01-10 ENCOUNTER — Other Ambulatory Visit: Payer: Medicare Other

## 2019-01-10 ENCOUNTER — Other Ambulatory Visit: Payer: Self-pay

## 2019-01-10 ENCOUNTER — Ambulatory Visit: Payer: Medicare Other | Admitting: Nurse Practitioner

## 2019-01-10 ENCOUNTER — Encounter: Payer: Self-pay | Admitting: Nurse Practitioner

## 2019-01-10 DIAGNOSIS — R42 Dizziness and giddiness: Secondary | ICD-10-CM | POA: Diagnosis not present

## 2019-01-10 DIAGNOSIS — I6310 Cerebral infarction due to embolism of unspecified precerebral artery: Secondary | ICD-10-CM | POA: Diagnosis not present

## 2019-01-10 DIAGNOSIS — R609 Edema, unspecified: Secondary | ICD-10-CM | POA: Diagnosis not present

## 2019-01-10 DIAGNOSIS — I639 Cerebral infarction, unspecified: Secondary | ICD-10-CM | POA: Diagnosis not present

## 2019-01-10 DIAGNOSIS — I6930 Unspecified sequelae of cerebral infarction: Secondary | ICD-10-CM | POA: Diagnosis not present

## 2019-01-10 LAB — COAGUCHEK XS/INR WAIVED
INR: 1.6 — ABNORMAL HIGH (ref 0.9–1.1)
Prothrombin Time: 19.7 s

## 2019-01-10 MED ORDER — WARFARIN SODIUM 5 MG PO TABS: 5.0000 mg | ORAL_TABLET | Freq: Every day | ORAL | 3 refills | Status: DC

## 2019-01-10 MED ORDER — MECLIZINE HCL 25 MG PO TABS: ORAL_TABLET | ORAL | 0 refills | Status: DC

## 2019-01-10 MED ORDER — WARFARIN SODIUM 2 MG PO TABS: 2.0000 mg | ORAL_TABLET | Freq: Every day | ORAL | 3 refills | Status: DC

## 2019-01-10 MED ORDER — FUROSEMIDE 40 MG PO TABS: 40.0000 mg | ORAL_TABLET | Freq: Every day | ORAL | 1 refills | Status: DC

## 2019-01-10 NOTE — Progress Notes (Signed)
Subjective:   CHIEF COMPLAINT:  INR recheck   Indication: CVA Bleeding signs/symptoms: None Thromboembolic signs/symptoms: None  Missed Coumadin doses: None Medication changes: no Dietary changes: no Bacterial/viral infection: no Other concerns: no  The following portions of the patient's history were reviewed and updated as appropriate: allergies, current medications, past family history, past medical history, past social history, past surgical history and problem list.  Review of Systems Pertinent items noted in HPI and remainder of comprehensive ROS otherwise negative.   Objective:    INR Today: 1.6 Current dose: coumadin 4mg  daily    Assessment:    Subtherapeutic INR for goal of 2.5-3.5   Plan:    1. New dose: coumadin 5mg  daily   2. Next INR: 2 weeks    Mary-Margaret Hassell Done, FNP

## 2019-01-10 NOTE — Addendum Note (Signed)
Addended by: Liliane Bade on: 01/10/2019 01:44 PM   Modules accepted: Orders

## 2019-01-11 DIAGNOSIS — R4189 Other symptoms and signs involving cognitive functions and awareness: Secondary | ICD-10-CM | POA: Diagnosis not present

## 2019-01-11 DIAGNOSIS — I639 Cerebral infarction, unspecified: Secondary | ICD-10-CM | POA: Diagnosis not present

## 2019-01-11 DIAGNOSIS — I059 Rheumatic mitral valve disease, unspecified: Secondary | ICD-10-CM | POA: Diagnosis not present

## 2019-01-11 DIAGNOSIS — E782 Mixed hyperlipidemia: Secondary | ICD-10-CM | POA: Diagnosis not present

## 2019-01-11 DIAGNOSIS — I10 Essential (primary) hypertension: Secondary | ICD-10-CM | POA: Diagnosis not present

## 2019-01-12 ENCOUNTER — Ambulatory Visit: Payer: Medicare Other | Admitting: Nurse Practitioner

## 2019-01-19 ENCOUNTER — Other Ambulatory Visit: Payer: Self-pay | Admitting: Nurse Practitioner

## 2019-01-19 DIAGNOSIS — R600 Localized edema: Secondary | ICD-10-CM

## 2019-01-19 DIAGNOSIS — R609 Edema, unspecified: Secondary | ICD-10-CM

## 2019-01-23 ENCOUNTER — Other Ambulatory Visit: Payer: Self-pay

## 2019-01-24 ENCOUNTER — Encounter: Payer: Self-pay | Admitting: Nurse Practitioner

## 2019-01-24 ENCOUNTER — Ambulatory Visit (INDEPENDENT_AMBULATORY_CARE_PROVIDER_SITE_OTHER): Payer: Medicare Other | Admitting: Nurse Practitioner

## 2019-01-24 VITALS — BP 122/65 | HR 89 | Temp 97.1°F | Ht 66.0 in | Wt 213.0 lb

## 2019-01-24 DIAGNOSIS — I6930 Unspecified sequelae of cerebral infarction: Secondary | ICD-10-CM | POA: Diagnosis not present

## 2019-01-24 DIAGNOSIS — I6310 Cerebral infarction due to embolism of unspecified precerebral artery: Secondary | ICD-10-CM

## 2019-01-24 LAB — COAGUCHEK XS/INR WAIVED
INR: 2.4 — ABNORMAL HIGH (ref 0.9–1.1)
Prothrombin Time: 29.3 s

## 2019-01-24 NOTE — Progress Notes (Signed)
Subjective:   Chief Complaint: INR recheck   Indication: CVA Bleeding signs/symptoms: None Thromboembolic signs/symptoms: None  Missed Coumadin doses: None Medication changes: no Dietary changes: no Bacterial/viral infection: no Other concerns: no  The following portions of the patient's history were reviewed and updated as appropriate: allergies, current medications, past family history, past medical history, past social history, past surgical history and problem list.  Review of Systems Pertinent items noted in HPI and remainder of comprehensive ROS otherwise negative.   Objective:    INR Today: 2.4 Current dose: coumadin 5mg  daily    Assessment:    Subtherapeutic INR for goal of 2.5-3.5   Plan:    1. New dose: coumadin 5mg  daily except 7.5mg  on wednesdays   2. Next INR: 1 month

## 2019-02-01 DIAGNOSIS — I471 Supraventricular tachycardia: Secondary | ICD-10-CM | POA: Diagnosis not present

## 2019-02-01 DIAGNOSIS — I639 Cerebral infarction, unspecified: Secondary | ICD-10-CM | POA: Diagnosis not present

## 2019-02-03 ENCOUNTER — Other Ambulatory Visit: Payer: Self-pay

## 2019-02-03 DIAGNOSIS — E782 Mixed hyperlipidemia: Secondary | ICD-10-CM

## 2019-02-03 MED ORDER — ATORVASTATIN CALCIUM 40 MG PO TABS: 40.0000 mg | ORAL_TABLET | Freq: Every day | ORAL | 1 refills | Status: DC

## 2019-02-12 ENCOUNTER — Other Ambulatory Visit: Payer: Self-pay | Admitting: Nurse Practitioner

## 2019-02-12 DIAGNOSIS — E782 Mixed hyperlipidemia: Secondary | ICD-10-CM

## 2019-02-18 ENCOUNTER — Other Ambulatory Visit: Payer: Self-pay | Admitting: Nurse Practitioner

## 2019-02-18 DIAGNOSIS — I10 Essential (primary) hypertension: Secondary | ICD-10-CM

## 2019-02-22 ENCOUNTER — Other Ambulatory Visit: Payer: Self-pay

## 2019-02-23 ENCOUNTER — Ambulatory Visit (INDEPENDENT_AMBULATORY_CARE_PROVIDER_SITE_OTHER): Payer: Medicare Other | Admitting: Nurse Practitioner

## 2019-02-23 ENCOUNTER — Other Ambulatory Visit: Payer: Self-pay

## 2019-02-23 ENCOUNTER — Encounter: Payer: Self-pay | Admitting: Nurse Practitioner

## 2019-02-23 DIAGNOSIS — I6930 Unspecified sequelae of cerebral infarction: Secondary | ICD-10-CM

## 2019-02-23 DIAGNOSIS — I6310 Cerebral infarction due to embolism of unspecified precerebral artery: Secondary | ICD-10-CM

## 2019-02-23 LAB — COAGUCHEK XS/INR WAIVED
INR: 2.2 — ABNORMAL HIGH (ref 0.9–1.1)
Prothrombin Time: 26.3 s

## 2019-02-23 NOTE — Progress Notes (Signed)
Subjective:     Indication: PE Bleeding signs/symptoms: None Thromboembolic signs/symptoms: None  Missed Coumadin doses: None Medication changes: no Dietary changes: no Bacterial/viral infection: no Other concerns: no  The following portions of the patient's history were reviewed and updated as appropriate: allergies, current medications, past family history, past medical history, past social history, past surgical history and problem list.  Review of Systems Pertinent items noted in HPI and remainder of comprehensive ROS otherwise negative.   Objective:    INR Today: 2.2 Current dose: coumadin 5mg  daily except 7.5mg  on wednesday    Assessment:    Subtherapeutic INR for goal of 2.5-3.5   Plan:    1. New dose: coumadin 5mg  daily except 7.5mg  on wed and sat   2. Next INR: 1 month    Mary-Margaret Hassell Done, FNP

## 2019-03-01 DIAGNOSIS — E785 Hyperlipidemia, unspecified: Secondary | ICD-10-CM | POA: Diagnosis not present

## 2019-03-01 DIAGNOSIS — E039 Hypothyroidism, unspecified: Secondary | ICD-10-CM | POA: Diagnosis not present

## 2019-03-01 DIAGNOSIS — Z7901 Long term (current) use of anticoagulants: Secondary | ICD-10-CM | POA: Diagnosis not present

## 2019-03-01 DIAGNOSIS — H8110 Benign paroxysmal vertigo, unspecified ear: Secondary | ICD-10-CM | POA: Diagnosis not present

## 2019-03-01 DIAGNOSIS — I639 Cerebral infarction, unspecified: Secondary | ICD-10-CM | POA: Diagnosis not present

## 2019-03-01 DIAGNOSIS — I10 Essential (primary) hypertension: Secondary | ICD-10-CM | POA: Diagnosis not present

## 2019-03-09 DIAGNOSIS — Z7901 Long term (current) use of anticoagulants: Secondary | ICD-10-CM | POA: Diagnosis not present

## 2019-03-09 DIAGNOSIS — Z8673 Personal history of transient ischemic attack (TIA), and cerebral infarction without residual deficits: Secondary | ICD-10-CM | POA: Diagnosis not present

## 2019-03-09 DIAGNOSIS — Z7982 Long term (current) use of aspirin: Secondary | ICD-10-CM | POA: Diagnosis not present

## 2019-03-09 DIAGNOSIS — Z79899 Other long term (current) drug therapy: Secondary | ICD-10-CM | POA: Diagnosis not present

## 2019-03-09 DIAGNOSIS — Z23 Encounter for immunization: Secondary | ICD-10-CM | POA: Diagnosis not present

## 2019-03-09 DIAGNOSIS — I059 Rheumatic mitral valve disease, unspecified: Secondary | ICD-10-CM | POA: Diagnosis not present

## 2019-03-09 DIAGNOSIS — I058 Other rheumatic mitral valve diseases: Secondary | ICD-10-CM | POA: Diagnosis not present

## 2019-03-22 ENCOUNTER — Telehealth: Payer: Self-pay | Admitting: Nurse Practitioner

## 2019-03-23 ENCOUNTER — Encounter: Payer: Self-pay | Admitting: Nurse Practitioner

## 2019-03-23 ENCOUNTER — Ambulatory Visit (INDEPENDENT_AMBULATORY_CARE_PROVIDER_SITE_OTHER): Payer: Medicare Other | Admitting: Nurse Practitioner

## 2019-03-23 ENCOUNTER — Other Ambulatory Visit: Payer: Self-pay

## 2019-03-23 VITALS — BP 113/68 | HR 78 | Temp 96.9°F | Resp 20 | Ht 66.0 in | Wt 211.0 lb

## 2019-03-23 DIAGNOSIS — I10 Essential (primary) hypertension: Secondary | ICD-10-CM | POA: Diagnosis not present

## 2019-03-23 DIAGNOSIS — I348 Other nonrheumatic mitral valve disorders: Secondary | ICD-10-CM | POA: Diagnosis not present

## 2019-03-23 DIAGNOSIS — I6310 Cerebral infarction due to embolism of unspecified precerebral artery: Secondary | ICD-10-CM

## 2019-03-23 DIAGNOSIS — Z7901 Long term (current) use of anticoagulants: Secondary | ICD-10-CM | POA: Diagnosis not present

## 2019-03-23 DIAGNOSIS — R002 Palpitations: Secondary | ICD-10-CM | POA: Diagnosis not present

## 2019-03-23 DIAGNOSIS — Z8673 Personal history of transient ischemic attack (TIA), and cerebral infarction without residual deficits: Secondary | ICD-10-CM | POA: Diagnosis not present

## 2019-03-23 DIAGNOSIS — Z7982 Long term (current) use of aspirin: Secondary | ICD-10-CM | POA: Diagnosis not present

## 2019-03-23 DIAGNOSIS — Z79899 Other long term (current) drug therapy: Secondary | ICD-10-CM | POA: Diagnosis not present

## 2019-03-23 LAB — COAGUCHEK XS/INR WAIVED
INR: 2.3 — ABNORMAL HIGH (ref 0.9–1.1)
Prothrombin Time: 27.4 s

## 2019-03-23 NOTE — Progress Notes (Signed)
Subjective:   Chief complaint: INR recheck   Indication: CVA Bleeding signs/symptoms: None Thromboembolic signs/symptoms: None  Missed Coumadin doses: None Medication changes: no Dietary changes: no Bacterial/viral infection: no Other concerns: no  The following portions of the patient's history were reviewed and updated as appropriate: allergies, current medications, past family history, past medical history, past social history, past surgical history and problem list.  Review of Systems Pertinent items noted in HPI and remainder of comprehensive ROS otherwise negative.   Objective:    INR Today: 2.3 Current dose: coumadin 5mg  daily except 7.5mg  w and saturday    Assessment:    Subtherapeutic INR for goal of 2.5-3.5   Plan:    1. New dose: Increase coumadin to 5mg  daily except 7.5 mg on M,W and F   2. Next INR: 1 month    Mary-Margaret Hassell Done, FNP

## 2019-04-12 DIAGNOSIS — I83891 Varicose veins of right lower extremities with other complications: Secondary | ICD-10-CM | POA: Diagnosis not present

## 2019-04-12 DIAGNOSIS — I83892 Varicose veins of left lower extremities with other complications: Secondary | ICD-10-CM | POA: Diagnosis not present

## 2019-04-12 DIAGNOSIS — I872 Venous insufficiency (chronic) (peripheral): Secondary | ICD-10-CM | POA: Diagnosis not present

## 2019-04-19 ENCOUNTER — Other Ambulatory Visit: Payer: Self-pay

## 2019-04-20 ENCOUNTER — Encounter: Payer: Self-pay | Admitting: Nurse Practitioner

## 2019-04-20 ENCOUNTER — Ambulatory Visit (INDEPENDENT_AMBULATORY_CARE_PROVIDER_SITE_OTHER): Payer: Medicare Other | Admitting: Nurse Practitioner

## 2019-04-20 VITALS — BP 120/73 | HR 66 | Temp 98.4°F | Resp 20 | Ht 66.0 in | Wt 210.0 lb

## 2019-04-20 DIAGNOSIS — M19042 Primary osteoarthritis, left hand: Secondary | ICD-10-CM | POA: Diagnosis not present

## 2019-04-20 DIAGNOSIS — I10 Essential (primary) hypertension: Secondary | ICD-10-CM | POA: Diagnosis not present

## 2019-04-20 DIAGNOSIS — I6310 Cerebral infarction due to embolism of unspecified precerebral artery: Secondary | ICD-10-CM

## 2019-04-20 DIAGNOSIS — I6930 Unspecified sequelae of cerebral infarction: Secondary | ICD-10-CM | POA: Diagnosis not present

## 2019-04-20 DIAGNOSIS — E782 Mixed hyperlipidemia: Secondary | ICD-10-CM

## 2019-04-20 DIAGNOSIS — Z6837 Body mass index (BMI) 37.0-37.9, adult: Secondary | ICD-10-CM | POA: Diagnosis not present

## 2019-04-20 DIAGNOSIS — R42 Dizziness and giddiness: Secondary | ICD-10-CM | POA: Diagnosis not present

## 2019-04-20 DIAGNOSIS — M19041 Primary osteoarthritis, right hand: Secondary | ICD-10-CM

## 2019-04-20 DIAGNOSIS — N1831 Chronic kidney disease, stage 3a: Secondary | ICD-10-CM | POA: Diagnosis not present

## 2019-04-20 DIAGNOSIS — E034 Atrophy of thyroid (acquired): Secondary | ICD-10-CM | POA: Diagnosis not present

## 2019-04-20 DIAGNOSIS — R609 Edema, unspecified: Secondary | ICD-10-CM

## 2019-04-20 DIAGNOSIS — Z8673 Personal history of transient ischemic attack (TIA), and cerebral infarction without residual deficits: Secondary | ICD-10-CM | POA: Insufficient documentation

## 2019-04-20 LAB — COAGUCHEK XS/INR WAIVED
INR: 3.5 — ABNORMAL HIGH (ref 0.9–1.1)
Prothrombin Time: 41.6 s

## 2019-04-20 MED ORDER — LEVOTHYROXINE SODIUM 50 MCG PO TABS: 50.0000 ug | ORAL_TABLET | Freq: Every day | ORAL | 11 refills | Status: DC

## 2019-04-20 MED ORDER — FUROSEMIDE 40 MG PO TABS: 40.0000 mg | ORAL_TABLET | Freq: Every day | ORAL | 1 refills | Status: DC

## 2019-04-20 MED ORDER — MECLIZINE HCL 25 MG PO TABS: ORAL_TABLET | ORAL | 0 refills | Status: DC

## 2019-04-20 MED ORDER — LOSARTAN POTASSIUM 100 MG PO TABS: 100.0000 mg | ORAL_TABLET | Freq: Every day | ORAL | 1 refills | Status: DC

## 2019-04-20 MED ORDER — WARFARIN SODIUM 5 MG PO TABS: ORAL_TABLET | ORAL | 1 refills | Status: DC

## 2019-04-20 MED ORDER — METOPROLOL TARTRATE 25 MG PO TABS: 25.0000 mg | ORAL_TABLET | Freq: Two times a day (BID) | ORAL | 1 refills | Status: DC

## 2019-04-20 MED ORDER — ATORVASTATIN CALCIUM 40 MG PO TABS: 40.0000 mg | ORAL_TABLET | Freq: Every day | ORAL | 1 refills | Status: DC

## 2019-04-20 NOTE — Addendum Note (Signed)
Addended by: Chevis Pretty on: 04/20/2019 09:56 AM   Modules accepted: Orders

## 2019-04-20 NOTE — Patient Instructions (Signed)
Edema  Edema is when you have too much fluid in your body or under your skin. Edema may make your legs, feet, and ankles swell up. Swelling is also common in looser tissues, like around your eyes. This is a common condition. It gets more common as you get older. There are many possible causes of edema. Eating too much salt (sodium) and being on your feet or sitting for a long time can cause edema in your legs, feet, and ankles. Hot weather may make edema worse. Edema is usually painless. Your skin may look swollen or shiny. Follow these instructions at home:  Keep the swollen body part raised (elevated) above the level of your heart when you are sitting or lying down.  Do not sit still or stand for a long time.  Do not wear tight clothes. Do not wear garters on your upper legs.  Exercise your legs. This can help the swelling go down.  Wear elastic bandages or support stockings as told by your doctor.  Eat a low-salt (low-sodium) diet to reduce fluid as told by your doctor.  Depending on the cause of your swelling, you may need to limit how much fluid you drink (fluid restriction).  Take over-the-counter and prescription medicines only as told by your doctor. Contact a doctor if:  Treatment is not working.  You have heart, liver, or kidney disease and have symptoms of edema.  You have sudden and unexplained weight gain. Get help right away if:  You have shortness of breath or chest pain.  You cannot breathe when you lie down.  You have pain, redness, or warmth in the swollen areas.  You have heart, liver, or kidney disease and get edema all of a sudden.  You have a fever and your symptoms get worse all of a sudden. Summary  Edema is when you have too much fluid in your body or under your skin.  Edema may make your legs, feet, and ankles swell up. Swelling is also common in looser tissues, like around your eyes.  Raise (elevate) the swollen body part above the level of your  heart when you are sitting or lying down.  Follow your doctor's instructions about diet and how much fluid you can drink (fluid restriction). This information is not intended to replace advice given to you by your health care provider. Make sure you discuss any questions you have with your health care provider. Document Released: 11/25/2007 Document Revised: 06/11/2017 Document Reviewed: 06/26/2016 Elsevier Patient Education  2020 Elsevier Inc.  

## 2019-04-20 NOTE — Progress Notes (Signed)
Subjective:    Patient ID: Kimberly Walsh, female    DOB: Oct 28, 1945, 73 y.o.   MRN: [id number]   Chief Complaint: Medical Management of Chronic Issues    HPI:  1. Essential hypertension, benign No c/o chest pain, sob or headache. Does not check blood pressure at home. BP Readings from Last 3 Encounters:  04/20/19 120/73  03/23/19 113/68  02/23/19 (!) 104/59     2. Mixed hyperlipidemia Tries to watch diet but is not able to do much exercise due to arthritis. Lab Results  Component Value Date   CHOL 136 06/23/2018   HDL 34 (L) 06/23/2018   LDLCALC 68 06/23/2018   TRIG 169 (H) 06/23/2018   CHOLHDL 4.0 06/23/2018     3. Cerebrovascular accident (CVA) due to embolism of precerebral artery ([place]) Had stroke in June and on thing affected is her hearing. She is doing well. Is on coumadin daily.  Indication: CVA Bleeding signs/symptoms: None Thromboembolic signs/symptoms: None  Missed Coumadin doses: None Medication changes: no Dietary changes: no Bacterial/viral infection: no Other concerns: no    4. Peripheral edema Has swelling at the end of each day. She wears compression hose most of th time.  5. Hypothyroidism due to acquired atrophy of thyroid No problems that aware of  6. Primary osteoarthritis of both hands bil knees and they hurt all the time.  7. Stage 3a chronic kidney disease No problems voiding Lab Results  Component Value Date   CREATININE 1.43 (H) 06/23/2018     8. BMI 37.0-37.9, adult No recent weight changes Wt Readings from Last 3 Encounters:  04/20/19 210 lb (95.3 kg)  03/23/19 211 lb (95.7 kg)  02/23/19 210 lb (95.3 kg)   BMI Readings from Last 3 Encounters:  04/20/19 33.89 kg/m  03/23/19 34.06 kg/m  02/23/19 33.89 kg/m      Outpatient Encounter Medications as of 04/20/2019  Medication Sig  . aspirin 325 MG tablet Take by mouth.  Marland Kitchen atorvastatin (LIPITOR) 40 MG tablet TAKE 1 TABLET BY MOUTH EVERY DAY  . furosemide  (LASIX) 40 MG tablet TAKE 1 TABLET BY MOUTH EVERY DAY  . levothyroxine (SYNTHROID) 50 MCG tablet Take 1 tablet (50 mcg total) by mouth daily.  Marland Kitchen losartan (COZAAR) 100 MG tablet Take 1 tablet by mouth once daily  . meclizine (ANTIVERT) 25 MG tablet TAKE ONE TABLET BY MOUTH EVERY 6 HOURS AS NEEDED FOR VERTIGO  . metoprolol tartrate (LOPRESSOR) 25 MG tablet Take by mouth.  . warfarin (COUMADIN) 5 MG tablet Take 1 tablet (5 mg total) by mouth daily.     Past Surgical History:  Procedure Laterality Date  . basal carcinoma rt leg    . CHOLECYSTECTOMY    . FINGER SURGERY Right    Ring finger  . MIDDLE EAR SURGERY     Fungus  . TUBAL LIGATION    . VEIN SURGERY      Family History  Problem Relation Age of Onset  . COPD Father     New complaints: None today  Social history: Her husband died 2 day sago  Controlled substance contract: n/a    Review of Systems  Constitutional: Negative for activity change and appetite change.  HENT: Negative.   Eyes: Negative for pain.  Respiratory: Negative for shortness of breath.   Cardiovascular: Positive for leg swelling. Negative for chest pain and palpitations.  Gastrointestinal: Negative for abdominal pain.  Endocrine: Negative for polydipsia.  Genitourinary: Negative.   Skin: Negative for rash.  Neurological: Negative for dizziness, weakness and headaches.  Hematological: Does not bruise/bleed easily.  Psychiatric/Behavioral: Negative.   All other systems reviewed and are negative.      Objective:   Physical Exam Vitals signs and nursing note reviewed.  Constitutional:      General: She is not in acute distress.    Appearance: Normal appearance. She is well-developed.  HENT:     Head: Normocephalic.     Nose: Nose normal.  Eyes:     Pupils: Pupils are equal, round, and reactive to light.  Neck:     Musculoskeletal: Normal range of motion and neck supple.     Vascular: No carotid bruit or JVD.  Cardiovascular:     Rate  and Rhythm: Normal rate and regular rhythm.     Heart sounds: Normal heart sounds.  Pulmonary:     Effort: Pulmonary effort is normal. No respiratory distress.     Breath sounds: Normal breath sounds. No wheezing or rales.  Chest:     Chest wall: No tenderness.  Abdominal:     General: Bowel sounds are normal. There is no distension or abdominal bruit.     Palpations: Abdomen is soft. There is no hepatomegaly, splenomegaly, mass or pulsatile mass.     Tenderness: There is no abdominal tenderness.  Musculoskeletal: Normal range of motion.     Right lower leg: Edema (2+ today) present.  Lymphadenopathy:     Cervical: No cervical adenopathy.  Skin:    General: Skin is warm and dry.  Neurological:     Mental Status: She is alert and oriented to person, place, and time.     Deep Tendon Reflexes: Reflexes are normal and symmetric.  Psychiatric:        Behavior: Behavior normal.        Thought Content: Thought content normal.        Judgment: Judgment normal.    BP 120/73   Pulse 66   Temp 98.4 F (36.9 C) (Temporal)   Resp 20   Ht 5' 6"  (1.676 m)   Wt 210 lb (95.3 kg)   SpO2 95%   BMI 33.89 kg/m   INR 3.5      Assessment & Plan:  [name] comes in today with chief complaint of Medical Management of Chronic Issues   Diagnosis and orders addressed:  1. Essential hypertension, benign Low sodium diet - CMP14+EGFR - losartan (COZAAR) 100 MG tablet; Take 1 tablet (100 mg total) by mouth daily.  Dispense: 90 tablet; Refill: 1  2. Mixed hyperlipidemia Low fat diet - Lipid panel - atorvastatin (LIPITOR) 40 MG tablet; Take 1 tablet (40 mg total) by mouth daily.  Dispense: 90 tablet; Refill: 1  3. Cerebrovascular accident (CVA) due to embolism of precerebral artery ([facility]) Continue coumadin at current dose recheck INR in 4 weeks - CoaguChek XS/INR Waived  4. Peripheral edema Elevate legs when sittiing Wear compression hose - furosemide (LASIX) 40 MG tablet; Take 1  tablet (40 mg total) by mouth daily.  Dispense: 90 tablet; Refill: 1  5. Hypothyroidism due to acquired atrophy of thyroid - levothyroxine (SYNTHROID) 50 MCG tablet; Take 1 tablet (50 mcg total) by mouth daily.  Dispense: 30 tablet; Refill: 11  6. Primary osteoarthritis of both hands To ortho when needed  7. Stage 3a chronic kidney disease Labs pending  8. BMI 37.0-37.9, adult Discussed diet and exercise for person with BMI >25 Will recheck weight in 3-6 months   9. VERTIGO -  meclizine (ANTIVERT) 25 MG tablet; TAKE ONE TABLET BY MOUTH EVERY 6 HOURS AS NEEDED FOR VERTIGO  Dispense: 30 tablet; Refill: 0   Labs pending Health Maintenance reviewed Diet and exercise encouraged  Follow up plan: 6 months   Mary-Margaret Hassell Done, FNP

## 2019-04-21 LAB — CMP14+EGFR
ALT: 21 IU/L (ref 0–32)
AST: 23 IU/L (ref 0–40)
Albumin/Globulin Ratio: 1.4 (ref 1.2–2.2)
Albumin: 3.9 g/dL (ref 3.7–4.7)
Alkaline Phosphatase: 136 IU/L — ABNORMAL HIGH (ref 39–117)
BUN/Creatinine Ratio: 19 (ref 12–28)
BUN: 26 mg/dL (ref 8–27)
Bilirubin Total: 0.4 mg/dL (ref 0.0–1.2)
CO2: 26 mmol/L (ref 20–29)
Calcium: 9 mg/dL (ref 8.7–10.3)
Chloride: 102 mmol/L (ref 96–106)
Creatinine, Ser: 1.36 mg/dL — ABNORMAL HIGH (ref 0.57–1.00)
GFR calc Af Amer: 45 mL/min/{1.73_m2} — ABNORMAL LOW (ref >59)
GFR calc non Af Amer: 39 mL/min/{1.73_m2} — ABNORMAL LOW (ref >59)
Globulin, Total: 2.8 g/dL (ref 1.5–4.5)
Glucose: 112 mg/dL — ABNORMAL HIGH (ref 65–99)
Potassium: 3.8 mmol/L (ref 3.5–5.2)
Sodium: 140 mmol/L (ref 134–144)
Total Protein: 6.7 g/dL (ref 6.0–8.5)

## 2019-04-21 LAB — LIPID PANEL
Chol/HDL Ratio: 3.3 ratio (ref 0.0–4.4)
Cholesterol, Total: 115 mg/dL (ref 100–199)
HDL: 35 mg/dL — ABNORMAL LOW (ref >39)
LDL Chol Calc (NIH): 56 mg/dL (ref 0–99)
Triglycerides: 134 mg/dL (ref 0–149)
VLDL Cholesterol Cal: 24 mg/dL (ref 5–40)

## 2019-04-27 DIAGNOSIS — I4581 Long QT syndrome: Secondary | ICD-10-CM | POA: Diagnosis not present

## 2019-04-27 DIAGNOSIS — R0789 Other chest pain: Secondary | ICD-10-CM | POA: Diagnosis not present

## 2019-04-27 DIAGNOSIS — M25562 Pain in left knee: Secondary | ICD-10-CM | POA: Diagnosis not present

## 2019-04-27 DIAGNOSIS — E039 Hypothyroidism, unspecified: Secondary | ICD-10-CM | POA: Diagnosis present

## 2019-04-27 DIAGNOSIS — I129 Hypertensive chronic kidney disease with stage 1 through stage 4 chronic kidney disease, or unspecified chronic kidney disease: Secondary | ICD-10-CM | POA: Diagnosis present

## 2019-04-27 DIAGNOSIS — B9729 Other coronavirus as the cause of diseases classified elsewhere: Secondary | ICD-10-CM | POA: Diagnosis not present

## 2019-04-27 DIAGNOSIS — N183 Chronic kidney disease, stage 3 unspecified: Secondary | ICD-10-CM | POA: Diagnosis not present

## 2019-04-27 DIAGNOSIS — I10 Essential (primary) hypertension: Secondary | ICD-10-CM | POA: Diagnosis not present

## 2019-04-27 DIAGNOSIS — J1289 Other viral pneumonia: Secondary | ICD-10-CM | POA: Diagnosis not present

## 2019-04-27 DIAGNOSIS — M25561 Pain in right knee: Secondary | ICD-10-CM | POA: Diagnosis not present

## 2019-04-27 DIAGNOSIS — Z825 Family history of asthma and other chronic lower respiratory diseases: Secondary | ICD-10-CM | POA: Diagnosis not present

## 2019-04-27 DIAGNOSIS — J9601 Acute respiratory failure with hypoxia: Secondary | ICD-10-CM | POA: Diagnosis not present

## 2019-04-27 DIAGNOSIS — Z7982 Long term (current) use of aspirin: Secondary | ICD-10-CM | POA: Diagnosis not present

## 2019-04-27 DIAGNOSIS — I8390 Asymptomatic varicose veins of unspecified lower extremity: Secondary | ICD-10-CM | POA: Diagnosis present

## 2019-04-27 DIAGNOSIS — R6889 Other general symptoms and signs: Secondary | ICD-10-CM | POA: Diagnosis not present

## 2019-04-27 DIAGNOSIS — U071 COVID-19: Secondary | ICD-10-CM | POA: Diagnosis not present

## 2019-04-27 DIAGNOSIS — Z79899 Other long term (current) drug therapy: Secondary | ICD-10-CM | POA: Diagnosis not present

## 2019-04-27 DIAGNOSIS — R05 Cough: Secondary | ICD-10-CM | POA: Diagnosis not present

## 2019-04-27 DIAGNOSIS — Z8673 Personal history of transient ischemic attack (TIA), and cerebral infarction without residual deficits: Secondary | ICD-10-CM | POA: Diagnosis not present

## 2019-04-27 DIAGNOSIS — H8110 Benign paroxysmal vertigo, unspecified ear: Secondary | ICD-10-CM | POA: Diagnosis present

## 2019-04-27 DIAGNOSIS — R918 Other nonspecific abnormal finding of lung field: Secondary | ICD-10-CM | POA: Diagnosis not present

## 2019-04-27 DIAGNOSIS — I6389 Other cerebral infarction: Secondary | ICD-10-CM | POA: Diagnosis not present

## 2019-04-27 DIAGNOSIS — Z7901 Long term (current) use of anticoagulants: Secondary | ICD-10-CM | POA: Diagnosis not present

## 2019-04-27 DIAGNOSIS — I639 Cerebral infarction, unspecified: Secondary | ICD-10-CM | POA: Diagnosis not present

## 2019-05-06 DIAGNOSIS — U071 COVID-19: Secondary | ICD-10-CM | POA: Diagnosis not present

## 2019-05-06 DIAGNOSIS — Z7901 Long term (current) use of anticoagulants: Secondary | ICD-10-CM | POA: Diagnosis not present

## 2019-05-06 DIAGNOSIS — Z8673 Personal history of transient ischemic attack (TIA), and cerebral infarction without residual deficits: Secondary | ICD-10-CM | POA: Diagnosis not present

## 2019-05-06 DIAGNOSIS — N183 Chronic kidney disease, stage 3 unspecified: Secondary | ICD-10-CM | POA: Diagnosis not present

## 2019-05-06 DIAGNOSIS — Z7952 Long term (current) use of systemic steroids: Secondary | ICD-10-CM | POA: Diagnosis not present

## 2019-05-06 DIAGNOSIS — I129 Hypertensive chronic kidney disease with stage 1 through stage 4 chronic kidney disease, or unspecified chronic kidney disease: Secondary | ICD-10-CM | POA: Diagnosis not present

## 2019-05-06 DIAGNOSIS — E039 Hypothyroidism, unspecified: Secondary | ICD-10-CM | POA: Diagnosis not present

## 2019-05-06 DIAGNOSIS — Z7982 Long term (current) use of aspirin: Secondary | ICD-10-CM | POA: Diagnosis not present

## 2019-05-06 DIAGNOSIS — M25561 Pain in right knee: Secondary | ICD-10-CM | POA: Diagnosis not present

## 2019-05-06 DIAGNOSIS — I8391 Asymptomatic varicose veins of right lower extremity: Secondary | ICD-10-CM | POA: Diagnosis not present

## 2019-05-06 DIAGNOSIS — Z6831 Body mass index (BMI) 31.0-31.9, adult: Secondary | ICD-10-CM | POA: Diagnosis not present

## 2019-05-06 DIAGNOSIS — Z5181 Encounter for therapeutic drug level monitoring: Secondary | ICD-10-CM | POA: Diagnosis not present

## 2019-05-06 DIAGNOSIS — E669 Obesity, unspecified: Secondary | ICD-10-CM | POA: Diagnosis not present

## 2019-05-06 DIAGNOSIS — Z9181 History of falling: Secondary | ICD-10-CM | POA: Diagnosis not present

## 2019-05-06 DIAGNOSIS — E782 Mixed hyperlipidemia: Secondary | ICD-10-CM | POA: Diagnosis not present

## 2019-05-06 DIAGNOSIS — M159 Polyosteoarthritis, unspecified: Secondary | ICD-10-CM | POA: Diagnosis not present

## 2019-05-06 DIAGNOSIS — J1289 Other viral pneumonia: Secondary | ICD-10-CM | POA: Diagnosis not present

## 2019-05-06 DIAGNOSIS — H8120 Vestibular neuronitis, unspecified ear: Secondary | ICD-10-CM | POA: Diagnosis not present

## 2019-05-06 DIAGNOSIS — H8110 Benign paroxysmal vertigo, unspecified ear: Secondary | ICD-10-CM | POA: Diagnosis not present

## 2019-05-06 DIAGNOSIS — J9601 Acute respiratory failure with hypoxia: Secondary | ICD-10-CM | POA: Diagnosis not present

## 2019-05-06 DIAGNOSIS — M25562 Pain in left knee: Secondary | ICD-10-CM | POA: Diagnosis not present

## 2019-05-08 ENCOUNTER — Ambulatory Visit (INDEPENDENT_AMBULATORY_CARE_PROVIDER_SITE_OTHER): Payer: Medicare Other | Admitting: Nurse Practitioner

## 2019-05-08 ENCOUNTER — Encounter: Payer: Self-pay | Admitting: Nurse Practitioner

## 2019-05-08 DIAGNOSIS — I6930 Unspecified sequelae of cerebral infarction: Secondary | ICD-10-CM

## 2019-05-08 DIAGNOSIS — Z7901 Long term (current) use of anticoagulants: Secondary | ICD-10-CM

## 2019-05-08 DIAGNOSIS — U071 COVID-19: Secondary | ICD-10-CM

## 2019-05-08 DIAGNOSIS — Z09 Encounter for follow-up examination after completed treatment for conditions other than malignant neoplasm: Secondary | ICD-10-CM

## 2019-05-08 NOTE — Progress Notes (Signed)
   Virtual Visit via telephone Note Due to COVID-19 pandemic this visit was conducted virtually. This visit type was conducted due to national recommendations for restrictions regarding the COVID-19 Pandemic (e.g. social distancing, sheltering in place) in an effort to limit this patient's exposure and mitigate transmission in our community. All issues noted in this document were discussed and addressed.  A physical exam was not performed with this format.  I connected with Kimberly Walsh on 05/08/19 at 2:40 by telephone and verified that I am speaking with the correct person using two identifiers. Kimberly Walsh is currently located at home and no one is currently with  her during visit. The provider, Kimberly Hassell Done, FNP is located in their office at time of visit.  I discussed the limitations, risks, security and privacy concerns of performing an evaluation and management service by telephone and the availability of in person appointments. I also discussed with the patient that there may be a patient responsible charge related to this service. The patient expressed understanding and agreed to proceed.   History and Present Illness:   Chief Complaint: Hospitalization Follow-up   HPI Patient calls in today for hospital follow up. She got sick right after her husband died and went o the hospital. She tested positive for covid. She had to had to have zpak, steroids and antibiodies. She was not put  On ventilator. She was very sick. She is much better. Still slight SOB and cough. Fatigue still exists.    Review of Systems  Constitutional: Negative for fever.  HENT: Negative.   Respiratory: Negative.   Cardiovascular: Negative.   Skin: Negative.   Neurological: Negative.   Psychiatric/Behavioral: Negative.   All other systems reviewed and are negative.    Observations/Objective: Alert and oriented- answers all questions appropriately mild distress Slight sob when talking  fatigue    Assessment and Plan: EZME GLANCY in today with chief complaint of Hospitalization Follow-up   1. COVID-19 virus infection [employer] fluids  2. Current use of anticoagulant therapy Will have home health check INR tomorrow  3. Hospital discharge follow-up Hospital records reviewed   Follow Up Instructions: Will wait on INR results to determine    I discussed the assessment and treatment plan with the patient. The patient was provided an opportunity to ask questions and all were answered. The patient agreed with the plan and demonstrated an understanding of the instructions.   The patient was advised to call back or seek an in-person evaluation if the symptoms worsen or if the condition fails to improve as anticipated.  The above assessment and management plan was discussed with the patient. The patient verbalized understanding of and has agreed to the management plan. Patient is aware to call the clinic if symptoms persist or worsen. Patient is aware when to return to the clinic for a follow-up visit. Patient educated on when it is appropriate to go to the emergency department.   Time call ended:  2:05  I provided 15 minutes of non-face-to-face time during this encounter.    Kimberly Hassell Done, FNP

## 2019-05-09 ENCOUNTER — Telehealth: Payer: Self-pay | Admitting: Nurse Practitioner

## 2019-05-09 NOTE — Telephone Encounter (Signed)
Patient aware and verbalized understanding. °

## 2019-05-09 NOTE — Telephone Encounter (Signed)
Pt aware [facility] nurse will be out tomorrow morning

## 2019-05-09 NOTE — Telephone Encounter (Signed)
I will check on it.

## 2019-05-09 NOTE — Telephone Encounter (Signed)
Pt aware that I am in contact w/ [facility] about her INR order

## 2019-05-10 ENCOUNTER — Ambulatory Visit: Payer: Self-pay | Admitting: Family Medicine

## 2019-05-10 ENCOUNTER — Telehealth: Payer: Self-pay | Admitting: *Deleted

## 2019-05-10 LAB — POCT INR: INR: 2.4 (ref 2.0–3.0)

## 2019-05-10 NOTE — Telephone Encounter (Signed)
TC from [place] w/ [facility] Today's INR 2.4

## 2019-05-10 NOTE — Progress Notes (Signed)
INR today 2.4 No change in therapy. Recheck in 4 weeks.

## 2019-05-10 NOTE — Telephone Encounter (Signed)
INR today 2.4 No change in therapy. Recheck in 4 weeks.

## 2019-05-10 NOTE — Telephone Encounter (Signed)
Kimberly Walsh with [facility] aware.

## 2019-05-11 ENCOUNTER — Ambulatory Visit: Payer: Medicare Other | Admitting: Nurse Practitioner

## 2019-05-15 ENCOUNTER — Other Ambulatory Visit: Payer: Self-pay

## 2019-05-15 MED ORDER — WARFARIN SODIUM 5 MG PO TABS: ORAL_TABLET | ORAL | 1 refills | Status: DC

## 2019-05-16 ENCOUNTER — Other Ambulatory Visit: Payer: Self-pay

## 2019-05-16 ENCOUNTER — Ambulatory Visit (INDEPENDENT_AMBULATORY_CARE_PROVIDER_SITE_OTHER): Payer: Medicare Other

## 2019-05-16 DIAGNOSIS — M159 Polyosteoarthritis, unspecified: Secondary | ICD-10-CM

## 2019-05-16 DIAGNOSIS — J9601 Acute respiratory failure with hypoxia: Secondary | ICD-10-CM

## 2019-05-16 DIAGNOSIS — Z5181 Encounter for therapeutic drug level monitoring: Secondary | ICD-10-CM

## 2019-05-16 DIAGNOSIS — U071 COVID-19: Secondary | ICD-10-CM | POA: Diagnosis not present

## 2019-05-16 DIAGNOSIS — Z7982 Long term (current) use of aspirin: Secondary | ICD-10-CM

## 2019-05-16 DIAGNOSIS — E669 Obesity, unspecified: Secondary | ICD-10-CM

## 2019-05-16 DIAGNOSIS — I8391 Asymptomatic varicose veins of right lower extremity: Secondary | ICD-10-CM

## 2019-05-16 DIAGNOSIS — J1289 Other viral pneumonia: Secondary | ICD-10-CM

## 2019-05-16 DIAGNOSIS — E782 Mixed hyperlipidemia: Secondary | ICD-10-CM

## 2019-05-16 DIAGNOSIS — M25562 Pain in left knee: Secondary | ICD-10-CM

## 2019-05-16 DIAGNOSIS — Z6831 Body mass index (BMI) 31.0-31.9, adult: Secondary | ICD-10-CM

## 2019-05-16 DIAGNOSIS — H8110 Benign paroxysmal vertigo, unspecified ear: Secondary | ICD-10-CM

## 2019-05-16 DIAGNOSIS — Z7901 Long term (current) use of anticoagulants: Secondary | ICD-10-CM

## 2019-05-16 DIAGNOSIS — N183 Chronic kidney disease, stage 3 unspecified: Secondary | ICD-10-CM

## 2019-05-16 DIAGNOSIS — Z9181 History of falling: Secondary | ICD-10-CM

## 2019-05-16 DIAGNOSIS — Z7952 Long term (current) use of systemic steroids: Secondary | ICD-10-CM

## 2019-05-16 DIAGNOSIS — H8120 Vestibular neuronitis, unspecified ear: Secondary | ICD-10-CM

## 2019-05-16 DIAGNOSIS — I129 Hypertensive chronic kidney disease with stage 1 through stage 4 chronic kidney disease, or unspecified chronic kidney disease: Secondary | ICD-10-CM

## 2019-05-16 DIAGNOSIS — Z8673 Personal history of transient ischemic attack (TIA), and cerebral infarction without residual deficits: Secondary | ICD-10-CM

## 2019-05-16 DIAGNOSIS — E039 Hypothyroidism, unspecified: Secondary | ICD-10-CM

## 2019-05-16 DIAGNOSIS — M25561 Pain in right knee: Secondary | ICD-10-CM

## 2019-05-23 ENCOUNTER — Encounter: Payer: Self-pay | Admitting: Nurse Practitioner

## 2019-05-23 ENCOUNTER — Other Ambulatory Visit: Payer: Self-pay

## 2019-05-23 ENCOUNTER — Ambulatory Visit (INDEPENDENT_AMBULATORY_CARE_PROVIDER_SITE_OTHER): Payer: Medicare Other | Admitting: Nurse Practitioner

## 2019-05-23 VITALS — BP 121/75 | HR 86 | Temp 97.8°F | Resp 20 | Ht 66.0 in | Wt 202.0 lb

## 2019-05-23 DIAGNOSIS — Z7901 Long term (current) use of anticoagulants: Secondary | ICD-10-CM | POA: Diagnosis not present

## 2019-05-23 DIAGNOSIS — R42 Dizziness and giddiness: Secondary | ICD-10-CM | POA: Diagnosis not present

## 2019-05-23 DIAGNOSIS — I6930 Unspecified sequelae of cerebral infarction: Secondary | ICD-10-CM

## 2019-05-23 LAB — COAGUCHEK XS/INR WAIVED
INR: 2.1 — ABNORMAL HIGH (ref 0.9–1.1)
Prothrombin Time: 24.6 s

## 2019-05-23 MED ORDER — MECLIZINE HCL 25 MG PO TABS: ORAL_TABLET | ORAL | 0 refills | Status: DC

## 2019-05-23 MED ORDER — LOSARTAN POTASSIUM 25 MG PO TABS: 25.0000 mg | ORAL_TABLET | Freq: Every day | ORAL | 1 refills | Status: DC

## 2019-05-23 NOTE — Progress Notes (Signed)
Subjective:   chief complaint:  INR recheck   Indication: CVA Bleeding signs/symptoms: None Thromboembolic signs/symptoms: None  Missed Coumadin doses: None Medication changes: [facility] changed heer coumadin to 4mg  daily. Dietary changes: no Bacterial/viral infection: no Other concerns: no  The following portions of the patient's history were reviewed and updated as appropriate: allergies, current medications, past family history, past medical history, past social history, past surgical history and problem list.  Review of Systems Pertinent items noted in HPI and remainder of comprehensive ROS otherwise negative.   Objective:    INR Today: 2.1 Current dose: 4mg  daily    Assessment:    Subtherapeutic INR for goal of 2.5-3.5   Plan:    1. New dose: coumadin 5mg  daily 2. Next INR: 1 month    Kimberly Hassell Done, FNP

## 2019-05-25 ENCOUNTER — Ambulatory Visit (INDEPENDENT_AMBULATORY_CARE_PROVIDER_SITE_OTHER): Payer: Medicare Other

## 2019-05-25 ENCOUNTER — Other Ambulatory Visit: Payer: Self-pay

## 2019-05-25 ENCOUNTER — Telehealth: Payer: Self-pay | Admitting: Nurse Practitioner

## 2019-05-25 DIAGNOSIS — M25562 Pain in left knee: Secondary | ICD-10-CM

## 2019-05-25 DIAGNOSIS — Z9181 History of falling: Secondary | ICD-10-CM

## 2019-05-25 DIAGNOSIS — Z6831 Body mass index (BMI) 31.0-31.9, adult: Secondary | ICD-10-CM

## 2019-05-25 DIAGNOSIS — H8120 Vestibular neuronitis, unspecified ear: Secondary | ICD-10-CM

## 2019-05-25 DIAGNOSIS — Z7952 Long term (current) use of systemic steroids: Secondary | ICD-10-CM

## 2019-05-25 DIAGNOSIS — N183 Chronic kidney disease, stage 3 unspecified: Secondary | ICD-10-CM

## 2019-05-25 DIAGNOSIS — H8110 Benign paroxysmal vertigo, unspecified ear: Secondary | ICD-10-CM

## 2019-05-25 DIAGNOSIS — Z7982 Long term (current) use of aspirin: Secondary | ICD-10-CM

## 2019-05-25 DIAGNOSIS — J1289 Other viral pneumonia: Secondary | ICD-10-CM

## 2019-05-25 DIAGNOSIS — M25561 Pain in right knee: Secondary | ICD-10-CM

## 2019-05-25 DIAGNOSIS — Z5181 Encounter for therapeutic drug level monitoring: Secondary | ICD-10-CM

## 2019-05-25 DIAGNOSIS — E669 Obesity, unspecified: Secondary | ICD-10-CM

## 2019-05-25 DIAGNOSIS — J9601 Acute respiratory failure with hypoxia: Secondary | ICD-10-CM | POA: Diagnosis not present

## 2019-05-25 DIAGNOSIS — U071 COVID-19: Secondary | ICD-10-CM

## 2019-05-25 DIAGNOSIS — M159 Polyosteoarthritis, unspecified: Secondary | ICD-10-CM

## 2019-05-25 DIAGNOSIS — I129 Hypertensive chronic kidney disease with stage 1 through stage 4 chronic kidney disease, or unspecified chronic kidney disease: Secondary | ICD-10-CM

## 2019-05-25 DIAGNOSIS — I8391 Asymptomatic varicose veins of right lower extremity: Secondary | ICD-10-CM

## 2019-05-25 DIAGNOSIS — E039 Hypothyroidism, unspecified: Secondary | ICD-10-CM

## 2019-05-25 DIAGNOSIS — Z7901 Long term (current) use of anticoagulants: Secondary | ICD-10-CM

## 2019-05-25 DIAGNOSIS — Z8673 Personal history of transient ischemic attack (TIA), and cerebral infarction without residual deficits: Secondary | ICD-10-CM

## 2019-05-25 DIAGNOSIS — E782 Mixed hyperlipidemia: Secondary | ICD-10-CM

## 2019-05-25 NOTE — Chronic Care Management (AMB) (Signed)
°  Chronic Care Management   Outreach Note  05/25/2019 Name: Kimberly Walsh MRN: [id number] DOB: Oct 25, 1945  Referred by: Chevis Pretty, FNP Reason for referral : Chronic Care Management (Initial CCM outreach was unsuccessful. )   An unsuccessful telephone outreach was attempted today. The patient was referred to the case management team by for assistance with care management and care coordination.   Follow Up Plan: A HIPPA compliant phone message was left for the patient providing contact information and requesting a return call.  The care management team will reach out to the patient again over the next 7 days.  If patient returns call to provider office, please advise to call [place] at [place], [place] [facility]  [place], [place] [address] Direct Dial: [email or web address]  Website: [email or web address]

## 2019-05-29 ENCOUNTER — Telehealth: Payer: Self-pay | Admitting: *Deleted

## 2019-05-29 NOTE — Chronic Care Management (AMB) (Signed)
°  Chronic Care Management   Outreach Note  05/29/2019 Name: Kimberly Walsh MRN: [id number] DOB: [birth year]  Referred by: Chevis Pretty, FNP Reason for referral : Chronic Care Management (Initial CCM outreach was unsuccessful. ) and Chronic Care Management (Second CCM outreach was unsuccessful. )   A second unsuccessful telephone outreach was attempted today. The patient was referred to the case management team for assistance with care management and care coordination.   Follow Up Plan: A HIPPA compliant phone message was left for the patient providing contact information and requesting a return call.  The care management team will reach out to the patient again over the next 7 days.  If patient returns call to provider office, please advise to call [place] at [place], [place] [facility]  [place], [place] [address] Direct Dial: [email or web address]  Website: [email or web address]

## 2019-05-29 NOTE — Telephone Encounter (Signed)
Vm from [place] Re[place] w/ [facility] Discharge visit w/ pt today Increased cough since last night and Right foot edema, encouraged to wear compression stockings Any other recommendations or Rx for cough Please advise

## 2019-05-30 NOTE — Telephone Encounter (Signed)
[place] nurse advised of provider feedback and states she will let the pt know.

## 2019-05-30 NOTE — Telephone Encounter (Signed)
OTC cough meds- delsym

## 2019-06-05 NOTE — Chronic Care Management (AMB) (Signed)
  Chronic Care Management   Outreach Note  06/05/2019 Name: Kimberly Walsh MRN: [id number] DOB: 28-Jan-1946  Referred by: Chevis Pretty, FNP Reason for referral : Chronic Care Management (Initial CCM outreach was unsuccessful. ), Chronic Care Management (Second CCM outreach was unsuccessful. ), and Chronic Care Management (Third CCM outreach was unsuccessful.)   Third unsuccessful telephone outreach was attempted today. The patient was referred to the case management team for assistance with care management and care coordination. The patient's primary care provider has been notified of our unsuccessful attempts to make or maintain contact with the patient. The care management team is pleased to engage with this patient at any time in the future should he/she be interested in assistance from the care management team.   Follow Up Plan: The care management team is available to follow up with the patient after provider conversation with the patient regarding recommendation for care management engagement and subsequent re-referral to the care management team.   [place], [place] [facility]  [place], [place] [address] Direct Dial: [email or web address]  Website: [email or web address]

## 2019-06-05 NOTE — Chronic Care Management (AMB) (Signed)
  Chronic Care Management   Note  06/05/2019 Name: ARYAA BUNTING MRN: [id number] DOB: [birth year]  Kimberly Walsh is a 73 y.o. year old female who is a primary care patient of Chevis Pretty, FNP. I reached out to [name] by phone today in response to a referral sent by Ms. Clearence Ped Zirbel's health plan.     Ms. Wible was given information about Chronic Care Management services today including:  1. CCM service includes personalized support from designated clinical staff supervised by her physician, including individualized plan of care and coordination with other care providers 2. 24/7 contact phone numbers for assistance for urgent and routine care needs. 3. Service will only be billed when office clinical staff spend 20 minutes or more in a month to coordinate care. 4. Only one practitioner may furnish and bill the service in a calendar month. 5. The patient may stop CCM services at any time (effective at the end of the month) by phone call to the office staff. 6. The patient will be responsible for cost sharing (co-pay) of up to 20% of the service fee (after annual deductible is met).  Patient agreed to services and verbal consent obtained.   Follow up plan: Telephone appointment with CCM team member scheduled for: 06/14/2019  [place], [place] [facility]  [place], [place] [address] Direct Dial: [email or web address]  Website: [email or web address]

## 2019-06-09 ENCOUNTER — Other Ambulatory Visit: Payer: Self-pay

## 2019-06-09 ENCOUNTER — Ambulatory Visit (INDEPENDENT_AMBULATORY_CARE_PROVIDER_SITE_OTHER): Payer: Medicare Other | Admitting: *Deleted

## 2019-06-09 DIAGNOSIS — Z0000 Encounter for general adult medical examination without abnormal findings: Secondary | ICD-10-CM

## 2019-06-09 NOTE — Progress Notes (Signed)
MEDICARE ANNUAL WELLNESS VISIT  06/09/2019  Telephone Visit Disclaimer This Medicare AWV was conducted by telephone due to national recommendations for restrictions regarding the COVID-19 Pandemic (e.g. social distancing).  I verified, using two identifiers, that I am speaking with [name] or their authorized healthcare agent. I discussed the limitations, risks, security, and privacy concerns of performing an evaluation and management service by telephone and the potential availability of an in-person appointment in the future. The patient expressed understanding and agreed to proceed.   Subjective:  VERDA Walsh is a 73 y.o. female patient of Kimberly Walsh, [place] who had a Medicare Annual Wellness Visit today via telephone. Lam is Working part time and lives alone after her husband recently passed away. she has 3 children. she reports that she is socially active and does interact with friends/family regularly. she is minimally physically active and enjoys reading her Bible, volunteering, yard work, spending time with her family and doing Sodoku puzzles.  Patient Care Team: Kimberly Pretty, FNP as PCP - General (Family Medicine) Ilean China, RN as Registered Nurse  Advanced Directives 06/09/2019  Does Patient Have a Medical Advance Directive? No  Would patient like information on creating a medical advance directive? No - Patient declined    Hospital Utilization Over the Past 12 Months: # of hospitalizations or ER visits: 2 # of surgeries: 0  Review of Systems    Patient reports that her overall health is unchanged compared to last year.  History obtained from chart review  Patient Reported Readings (BP, Pulse, CBG, Weight, etc) none  Pain Assessment Pain : No/denies pain     Current Medications & Allergies (verified) Allergies as of 06/09/2019      Reactions   Ace Inhibitors Cough      Medication List       Accurate as of June 09, 2019  9:51 AM. If you have any questions, ask your nurse or doctor.        aspirin EC 81 MG tablet Take 81 mg by mouth daily. What changed: Another medication with the same name was removed. Continue taking this medication, and follow the directions you see here.   atorvastatin 40 MG tablet Commonly known as: LIPITOR Take 1 tablet (40 mg total) by mouth daily.   furosemide 40 MG tablet Commonly known as: LASIX Take 1 tablet (40 mg total) by mouth daily.   levothyroxine 50 MCG tablet Commonly known as: Synthroid Take 1 tablet (50 mcg total) by mouth daily.   losartan 25 MG tablet Commonly known as: COZAAR Take 1 tablet (25 mg total) by mouth daily.   meclizine 25 MG tablet Commonly known as: ANTIVERT TAKE ONE TABLET BY MOUTH EVERY 6 HOURS AS NEEDED FOR VERTIGO   metoprolol tartrate 25 MG tablet Commonly known as: LOPRESSOR Take 1 tablet (25 mg total) by mouth 2 (two) times daily.   warfarin 5 MG tablet Commonly known as: COUMADIN Take as directed by the anticoagulation clinic. If you are unsure how to take this medication, talk to your nurse or doctor. Original instructions: 1-2 tablets as directed daily       History (reviewed): Past Medical History:  Diagnosis Date  . Hyperlipidemia   . Venous stasis   . Vertigo    Past Surgical History:  Procedure Laterality Date  . basal carcinoma rt leg    . CHOLECYSTECTOMY    . FINGER SURGERY Right    Ring finger  . MIDDLE EAR SURGERY  Fungus  . TUBAL LIGATION    . VEIN SURGERY     Family History  Problem Relation Age of Onset  . COPD Father    Social History   Socioeconomic History  . Marital status: Widowed    Spouse name: Not on file  . Number of children: 3  . Years of education: 53  . Highest education level: High school graduate  Occupational History    Employer: [employer]    Comment: part time  Tobacco Use  . Smoking status: Never Smoker  . Smokeless tobacco: Never Used  Substance and Sexual  Activity  . Alcohol use: No  . Drug use: No  . Sexual activity: Not Currently  Other Topics Concern  . Not on file  Social History Narrative  . Not on file   Social Determinants of Health   Financial Resource Strain: Low Risk   . Difficulty of Paying Living Expenses: Not hard at all  Food Insecurity: No Food Insecurity  . Worried About Charity fundraiser in the Last Year: Never true  . Ran Out of Food in the Last Year: Never true  Transportation Needs: No Transportation Needs  . Lack of Transportation (Medical): No  . Lack of Transportation (Non-Medical): No  Physical Activity: Inactive  . Days of Exercise per Week: 0 days  . Minutes of Exercise per Session: 0 min  Stress: No Stress Concern Present  . Feeling of Stress : Only a little  Social Connections: Slightly Isolated  . Frequency of Communication with Friends and Family: More than three times a week  . Frequency of Social Gatherings with Friends and Family: More than three times a week  . Attends Religious Services: More than 4 times per year  . Active Member of Clubs or Organizations: Yes  . Attends Archivist Meetings: More than 4 times per year  . Marital Status: Widowed    Activities of Daily Living In your present state of health, do you have any difficulty performing the following activities: 06/09/2019  Hearing? Y  Comment hard of hearing  Vision? N  Comment wears glasses all the time-gets yearly eye exam  Difficulty concentrating or making decisions? N  Walking or climbing stairs? N  Dressing or bathing? N  Doing errands, shopping? N  Preparing Food and eating ? N  Using the Toilet? N  In the past six months, have you accidently leaked urine? Y  Comment stress incontinence-coughing  Do you have problems with loss of bowel control? N  Managing your Medications? N  Managing your Finances? N  Housekeeping or managing your Housekeeping? N  Some recent data might be hidden    Patient  Education/ Literacy How often do you need to have someone help you when you read instructions, pamphlets, or other written materials from your doctor or pharmacy?: 1 - Never What is the last grade level you completed in school?: 12th grade  Exercise Current Exercise Habits: The patient does not participate in regular exercise at present, Exercise limited by: orthopedic condition(s)  Diet Patient reports consuming 2 meals a day and 3 snack(s) a day Patient reports that her primary diet is: Regular Patient reports that she does have regular access to food.   Depression Screen PHQ 2/9 Scores 06/09/2019 05/23/2019 03/23/2019 02/23/2019 01/03/2019 06/23/2018 03/21/2018  PHQ - 2 Score 1 0 0 0 1 0 0     Fall Risk Fall Risk  06/09/2019 05/23/2019 04/20/2019 03/23/2019 02/23/2019  Falls in the past  year? 0 0 0 0 0     Objective:  ALLIZON WOZNICK seemed alert and oriented and she participated appropriately during our telephone visit.  Blood Pressure Weight BMI  BP Readings from Last 3 Encounters:  05/23/19 121/75  04/20/19 120/73  03/23/19 113/68   Wt Readings from Last 3 Encounters:  05/23/19 202 lb (91.6 kg)  04/20/19 210 lb (95.3 kg)  03/23/19 211 lb (95.7 kg)   BMI Readings from Last 1 Encounters:  05/23/19 32.60 kg/m    *Unable to obtain current vital signs, weight, and BMI due to telephone visit type  Hearing/Vision  . Fate did not seem to have difficulty with hearing/understanding during the telephone conversation . Reports that she has not had a formal eye exam by an eye care professional within the past year . Reports that she has not had a formal hearing evaluation within the past year *Unable to fully assess hearing and vision during telephone visit type  Cognitive Function: 6CIT Screen 06/09/2019  What Year? 0 points  What month? 0 points  What time? 0 points  Count back from 20 0 points  Months in reverse 0 points  Repeat phrase 2 points  Total Score 2   (Normal:0-7,  Significant for Dysfunction: >8)  Normal Cognitive Function Screening: Yes   Immunization & Health Maintenance Record Immunization History  Administered Date(s) Administered  . Hepatitis B 09/25/1998, 10/31/1998, 04/03/1999  . Influenza Whole 03/10/2012  . Influenza, High Dose Seasonal PF 04/02/2013, 04/03/2017, 02/22/2018, 03/09/2019  . Influenza,inj,Quad PF,6+ Mos 05/05/2016  . Influenza-Unspecified 03/31/2014, 03/31/2015  . MMR 10/22/2011  . Pneumococcal Conjugate-13 05/09/2013  . Pneumococcal Polysaccharide-23 09/28/2016, 05/04/2019  . Tdap 04/09/2011  . Zoster 05/23/2013    Health Maintenance  Topic Date Due  . PAP SMEAR-Modifier  05/09/2014  . Fecal DNA (Cologuard)  05/22/2020 (Originally 04/17/1997)  . MAMMOGRAM  05/03/2020  . TETANUS/TDAP  04/08/2021  . DEXA SCAN  07/28/2022  . INFLUENZA VACCINE  Completed  . Hepatitis C Screening  Completed  . PNA vac Low Risk Adult  Completed       Assessment  This is a routine wellness examination for [name].  Health Maintenance: Due or Overdue Health Maintenance Due  Topic Date Due  . PAP SMEAR-Modifier  05/09/2014    [name] does not need a referral for Community Assistance: Care Management:   no Social Work:    no Prescription Assistance:  no Nutrition/Diabetes Education:  no   Plan:  Personalized Goals Goals Addressed            This Visit's Progress   . DIET - INCREASE WATER INTAKE       Try to drink 6-8 glasses of water daily      Personalized Health Maintenance & Screening Recommendations  She is up to date on all recommended health screenings  Lung Cancer Screening Recommended: no (Low Dose CT Chest recommended if Age 51-80 years, 30 pack-year currently smoking OR have quit w/in past 15 years) Hepatitis C Screening recommended: no HIV Screening recommended: no  Advanced Directives: Written information was not prepared per patient's request.  Referrals & Orders No orders of the  defined types were placed in this encounter.   Follow-up Plan . Follow-up with Kimberly Pretty, FNP as planned   I have personally reviewed and noted the following in the patient's chart:   . Medical and social history . Use of alcohol, tobacco or illicit drugs  . Current medications and supplements .  Functional ability and status . Nutritional status . Physical activity . Advanced directives . List of other physicians . Hospitalizations, surgeries, and ER visits in previous 12 months . Vitals . Screenings to include cognitive, depression, and falls . Referrals and appointments  In addition, I have reviewed and discussed with [name] certain preventive protocols, quality metrics, and best practice recommendations. A written personalized care plan for preventive services as well as general preventive health recommendations is available and can be mailed to the patient at her request.      Milas Hock, LPN  [id number]

## 2019-06-09 NOTE — Patient Instructions (Signed)
Preventive Care 72 Years and Older, Female Preventive care refers to lifestyle choices and visits with your health care provider that can promote health and wellness. This includes:  A yearly physical exam. This is also called an annual well check.  Regular dental and eye exams.  Immunizations.  Screening for certain conditions.  Healthy lifestyle choices, such as diet and exercise. What can I expect for my preventive care visit? Physical exam Your health care provider will check:  Height and weight. These may be used to calculate body mass index (BMI), which is a measurement that tells if you are at a healthy weight.  Heart rate and blood pressure.  Your skin for abnormal spots. Counseling Your health care provider may ask you questions about:  Alcohol, tobacco, and drug use.  Emotional well-being.  Home and relationship well-being.  Sexual activity.  Eating habits.  History of falls.  Memory and ability to understand (cognition).  Work and work Statistician.  Pregnancy and menstrual history. What immunizations do I need?  Influenza (flu) vaccine  This is recommended every year. Tetanus, diphtheria, and pertussis (Tdap) vaccine  You may need a Td booster every 10 years. Varicella (chickenpox) vaccine  You may need this vaccine if you have not already been vaccinated. Zoster (shingles) vaccine  You may need this after age 72. Pneumococcal conjugate (PCV13) vaccine  One dose is recommended after age 72. Pneumococcal polysaccharide (PPSV23) vaccine  One dose is recommended after age 72. Measles, mumps, and rubella (MMR) vaccine  You may need at least one dose of MMR if you were born in 1957 or later. You may also need a second dose. Meningococcal conjugate (MenACWY) vaccine  You may need this if you have certain conditions. Hepatitis A vaccine  You may need this if you have certain conditions or if you travel or work in places where you may be exposed  to hepatitis A. Hepatitis B vaccine  You may need this if you have certain conditions or if you travel or work in places where you may be exposed to hepatitis B. Haemophilus influenzae type b (Hib) vaccine  You may need this if you have certain conditions. You may receive vaccines as individual doses or as more than one vaccine together in one shot (combination vaccines). Talk with your health care provider about the risks and benefits of combination vaccines. What tests do I need? Blood tests  Lipid and cholesterol levels. These may be checked every 5 years, or more frequently depending on your overall health.  Hepatitis C test.  Hepatitis B test. Screening  Lung cancer screening. You may have this screening every year starting at age 72 if you have a 30-pack-year history of smoking and currently smoke or have quit within the past 15 years.  Colorectal cancer screening. All adults should have this screening starting at age 36 and continuing until age 15. Your health care provider may recommend screening at age 23 if you are at increased risk. You will have tests every 1-10 years, depending on your results and the type of screening test.  Diabetes screening. This is done by checking your blood sugar (glucose) after you have not eaten for a while (fasting). You may have this done every 1-3 years.  Mammogram. This may be done every 1-2 years. Talk with your health care provider about how often you should have regular mammograms.  BRCA-related cancer screening. This may be done if you have a family history of breast, ovarian, tubal, or peritoneal cancers.  Other tests  Sexually transmitted disease (STD) testing.  Bone density scan. This is done to screen for osteoporosis. You may have this done starting at age 7. Follow these instructions at home: Eating and drinking  Eat a diet that includes fresh fruits and vegetables, whole grains, lean protein, and low-fat dairy products. Limit  your intake of foods with high amounts of sugar, saturated fats, and salt.  Take vitamin and mineral supplements as recommended by your health care provider.  Do not drink alcohol if your health care provider tells you not to drink.  If you drink alcohol: ? Limit how much you have to 0-1 drink a day. ? Be aware of how much alcohol is in your drink. In the [place] one drink equals one 12 oz bottle of beer (355 mL), one 5 oz glass of wine (148 mL), or one 1 oz glass of hard liquor (44 mL). Lifestyle  Take daily care of your teeth and gums.  Stay active. Exercise for at least 30 minutes on 5 or more days each week.  Do not use any products that contain nicotine or tobacco, such as cigarettes, e-cigarettes, and chewing tobacco. If you need help quitting, ask your health care provider.  If you are sexually active, practice safe sex. Use a condom or other form of protection in order to prevent STIs (sexually transmitted infections).  Talk with your health care provider about taking a low-dose aspirin or statin. What's next?  Go to your health care provider once a year for a well check visit.  Ask your health care provider how often you should have your eyes and teeth checked.  Stay up to date on all vaccines. This information is not intended to replace advice given to you by your health care provider. Make sure you discuss any questions you have with your health care provider. Document Released: 07/05/2015 Document Revised: 06/02/2018 Document Reviewed: 06/02/2018 Elsevier Patient Education  2020 [employer].

## 2019-06-14 ENCOUNTER — Ambulatory Visit: Payer: Medicare Other | Admitting: *Deleted

## 2019-06-14 DIAGNOSIS — M19042 Primary osteoarthritis, left hand: Secondary | ICD-10-CM

## 2019-06-14 DIAGNOSIS — I10 Essential (primary) hypertension: Secondary | ICD-10-CM

## 2019-06-14 DIAGNOSIS — M19041 Primary osteoarthritis, right hand: Secondary | ICD-10-CM

## 2019-06-15 NOTE — Chronic Care Management (AMB) (Addendum)
  Chronic Care Management   Initial Outreach Note  06/14/2019 Name: ROSLIN Walsh MRN: [id number] DOB: 1945/10/16  Referred by: Chevis Pretty, FNP Reason for referral : Chronic Care Management (RN Initial Outreach)   An unsuccessful initial RN telephone outreach was attempted today. The patient was referred to the case management team by for assistance with care management and care coordination.   Follow Up Plan: The care management team will reach out to the patient again over the next 30 days.   Chong Sicilian, BSN, RN-BC Embedded Chronic Care Manager [facility] [place] [facility] Direct Dial: [phone]    "I have reviewed this encounter including the documentation in this note and/or discussed this patient with the nurse coordinator, Chong Sicilian, RN . I am certifying that I agree with the content of this note as supervising physician." [place], FNP

## 2019-06-19 ENCOUNTER — Other Ambulatory Visit: Payer: Self-pay

## 2019-06-20 ENCOUNTER — Ambulatory Visit (INDEPENDENT_AMBULATORY_CARE_PROVIDER_SITE_OTHER): Payer: Medicare Other | Admitting: Nurse Practitioner

## 2019-06-20 ENCOUNTER — Encounter: Payer: Self-pay | Admitting: Nurse Practitioner

## 2019-06-20 VITALS — BP 111/63 | HR 78 | Temp 97.1°F | Resp 20 | Ht 66.0 in | Wt 209.0 lb

## 2019-06-20 DIAGNOSIS — I6930 Unspecified sequelae of cerebral infarction: Secondary | ICD-10-CM | POA: Diagnosis not present

## 2019-06-20 DIAGNOSIS — Z7901 Long term (current) use of anticoagulants: Secondary | ICD-10-CM

## 2019-06-20 LAB — COAGUCHEK XS/INR WAIVED
INR: 1.9 — ABNORMAL HIGH (ref 0.9–1.1)
Prothrombin Time: 22.7 s

## 2019-06-20 NOTE — Progress Notes (Signed)
Subjective:   Chief Complaint: INR recheck    Indication: CVA Bleeding signs/symptoms: None Thromboembolic signs/symptoms: None  Missed Coumadin doses: None Medication changes: no Dietary changes: no Bacterial/viral infection: no Other concerns: no  The following portions of the patient's history were reviewed and updated as appropriate: allergies, current medications, past family history, past medical history, past social history, past surgical history and problem list.  Review of Systems Pertinent items noted in HPI and remainder of comprehensive ROS otherwise negative.   Objective:    INR Today: 1.9 Current dose: coumadin 5mg  daily    Assessment:    Subtherapeutic INR for goal of 2.5-3.5   Plan:    1. New dose: coumadin 5mg  daily except 7.5mg  on tuesdays   2. Next INR: 2 weeks    Mary-Margaret Hassell Done, FNP

## 2019-07-03 ENCOUNTER — Other Ambulatory Visit: Payer: Self-pay

## 2019-07-04 ENCOUNTER — Encounter: Payer: Self-pay | Admitting: Nurse Practitioner

## 2019-07-04 ENCOUNTER — Ambulatory Visit (INDEPENDENT_AMBULATORY_CARE_PROVIDER_SITE_OTHER): Payer: Medicare Other | Admitting: Nurse Practitioner

## 2019-07-04 VITALS — BP 116/65 | HR 76 | Temp 96.8°F | Resp 20 | Ht 66.0 in | Wt 211.0 lb

## 2019-07-04 DIAGNOSIS — Z7901 Long term (current) use of anticoagulants: Secondary | ICD-10-CM | POA: Diagnosis not present

## 2019-07-04 LAB — COAGUCHEK XS/INR WAIVED
INR: 1.8 — ABNORMAL HIGH (ref 0.9–1.1)
Prothrombin Time: 21.5 s

## 2019-07-04 MED ORDER — BENZONATATE 100 MG PO CAPS: 100.0000 mg | ORAL_CAPSULE | Freq: Three times a day (TID) | ORAL | 0 refills | Status: DC | PRN

## 2019-07-04 NOTE — Progress Notes (Signed)
Subjective:   chief complaint: INR recheck:   Indication: CVA Bleeding signs/symptoms: None Thromboembolic signs/symptoms: None  Missed Coumadin doses: None Medication changes: no Dietary changes: no Bacterial/viral infection: no Other concerns: no  The following portions of the patient's history were reviewed and updated as appropriate: allergies, current medications, past family history, past medical history, past social history, past surgical history and problem list.  Review of Systems Pertinent items noted in HPI and remainder of comprehensive ROS otherwise negative.   Objective:    INR Today: 1.8 Current dose: coumadin 5mg  daily except 7.5mg  on tuesday    Assessment:    Subtherapeutic INR for goal of 2.5-3.5   Plan:    1. New dose: Take 2 tab tomorrow ( 10mg ) then increased coumadin to 5mg  daily except 7.5mg  on tues, and thurs   2. Next INR: 2 weeks    Mary-Margaret Hassell Done, FNP

## 2019-07-17 ENCOUNTER — Other Ambulatory Visit: Payer: Self-pay

## 2019-07-18 ENCOUNTER — Encounter: Payer: Self-pay | Admitting: Nurse Practitioner

## 2019-07-18 ENCOUNTER — Ambulatory Visit (INDEPENDENT_AMBULATORY_CARE_PROVIDER_SITE_OTHER): Payer: Medicare Other | Admitting: Nurse Practitioner

## 2019-07-18 DIAGNOSIS — I6310 Cerebral infarction due to embolism of unspecified precerebral artery: Secondary | ICD-10-CM | POA: Diagnosis not present

## 2019-07-18 LAB — COAGUCHEK XS/INR WAIVED
INR: 1.7 — ABNORMAL HIGH (ref 0.9–1.1)
Prothrombin Time: 20.7 s

## 2019-07-18 NOTE — Progress Notes (Signed)
Subjective:   Chief Complaint: INR recheck   Indication: CVA Bleeding signs/symptoms: None Thromboembolic signs/symptoms: None  Missed Coumadin doses: None Medication changes: no Dietary changes: no Bacterial/viral infection: no Other concerns: no  The following portions of the patient's history were reviewed and updated as appropriate: allergies, current medications, past family history, past medical history, past social history, past surgical history and problem list.  Review of Systems Pertinent items noted in HPI and remainder of comprehensive ROS otherwise negative.   Objective:    INR Today: 1.7 Current dose: coumadin 5mg  dialy except 7.5mg  on tues and thursday    Assessment:    Subtherapeutic INR for goal of 2.5-3.5   Plan:    1. New dose: Take 2 tab tomorrow ( 10mg ) then increased coumadin to 5mg  daily except 7.5mg  on tues,  thurs and sat   2. Next INR: 2 weeks    Mary-Margaret Hassell Done, FNP

## 2019-07-28 ENCOUNTER — Other Ambulatory Visit: Payer: Self-pay

## 2019-07-28 MED ORDER — BENZONATATE 100 MG PO CAPS: 100.0000 mg | ORAL_CAPSULE | Freq: Three times a day (TID) | ORAL | 1 refills | Status: DC | PRN

## 2019-07-31 ENCOUNTER — Other Ambulatory Visit: Payer: Self-pay

## 2019-07-31 DIAGNOSIS — Z23 Encounter for immunization: Secondary | ICD-10-CM | POA: Diagnosis not present

## 2019-08-01 ENCOUNTER — Ambulatory Visit: Payer: Self-pay | Admitting: Nurse Practitioner

## 2019-08-01 DIAGNOSIS — T50Z95A Adverse effect of other vaccines and biological substances, initial encounter: Secondary | ICD-10-CM | POA: Diagnosis not present

## 2019-08-01 DIAGNOSIS — M791 Myalgia, unspecified site: Secondary | ICD-10-CM | POA: Diagnosis not present

## 2019-08-01 DIAGNOSIS — R0602 Shortness of breath: Secondary | ICD-10-CM | POA: Diagnosis not present

## 2019-08-01 DIAGNOSIS — Y998 Other external cause status: Secondary | ICD-10-CM | POA: Diagnosis not present

## 2019-08-01 DIAGNOSIS — J90 Pleural effusion, not elsewhere classified: Secondary | ICD-10-CM | POA: Diagnosis not present

## 2019-08-01 DIAGNOSIS — R05 Cough: Secondary | ICD-10-CM | POA: Diagnosis not present

## 2019-08-01 DIAGNOSIS — I498 Other specified cardiac arrhythmias: Secondary | ICD-10-CM | POA: Diagnosis not present

## 2019-08-01 DIAGNOSIS — I491 Atrial premature depolarization: Secondary | ICD-10-CM | POA: Diagnosis not present

## 2019-08-01 DIAGNOSIS — R9431 Abnormal electrocardiogram [ECG] [EKG]: Secondary | ICD-10-CM | POA: Diagnosis not present

## 2019-08-01 DIAGNOSIS — X58XXXA Exposure to other specified factors, initial encounter: Secondary | ICD-10-CM | POA: Diagnosis not present

## 2019-08-01 DIAGNOSIS — R5383 Other fatigue: Secondary | ICD-10-CM | POA: Diagnosis not present

## 2019-08-15 ENCOUNTER — Ambulatory Visit (INDEPENDENT_AMBULATORY_CARE_PROVIDER_SITE_OTHER): Payer: Medicare Other

## 2019-08-15 ENCOUNTER — Ambulatory Visit (INDEPENDENT_AMBULATORY_CARE_PROVIDER_SITE_OTHER): Payer: Medicare Other | Admitting: Nurse Practitioner

## 2019-08-15 ENCOUNTER — Encounter: Payer: Self-pay | Admitting: Nurse Practitioner

## 2019-08-15 ENCOUNTER — Other Ambulatory Visit: Payer: Self-pay

## 2019-08-15 VITALS — BP 122/66 | HR 88 | Temp 98.6°F | Resp 20 | Ht 66.0 in | Wt 207.0 lb

## 2019-08-15 DIAGNOSIS — Z8616 Personal history of COVID-19: Secondary | ICD-10-CM

## 2019-08-15 DIAGNOSIS — R079 Chest pain, unspecified: Secondary | ICD-10-CM

## 2019-08-15 DIAGNOSIS — R918 Other nonspecific abnormal finding of lung field: Secondary | ICD-10-CM | POA: Diagnosis not present

## 2019-08-15 DIAGNOSIS — I6930 Unspecified sequelae of cerebral infarction: Secondary | ICD-10-CM

## 2019-08-15 DIAGNOSIS — I6310 Cerebral infarction due to embolism of unspecified precerebral artery: Secondary | ICD-10-CM | POA: Diagnosis not present

## 2019-08-15 LAB — COAGUCHEK XS/INR WAIVED
INR: 1.7 — ABNORMAL HIGH (ref 0.9–1.1)
Prothrombin Time: 20.7 s

## 2019-08-15 NOTE — Progress Notes (Signed)
Subjective:    Chief Complaint: INR recheck   Indication: CVA Bleeding signs/symptoms: None Thromboembolic signs/symptoms: None  Missed Coumadin doses: None Medication changes: no Dietary changes: no Bacterial/viral infection: no Other concerns: no  The following portions of the patient's history were reviewed and updated as appropriate: allergies, current medications, past family history, past medical history, past social history, past surgical history and problem list.  - patient has reaction to covid vaccine- was achy all mover for 2 weeks, loss taste and smell. She is still having SOB. - when she lays down on her right side she has pain and has to roll over.   Review of Systems A comprehensive review of systems was negative except for: Respiratory: positive for dyspnea on exertion   Objective:    INR Today: 1.7 Current dose: coumadin 5mg  daily except 7.5mg  on t,th and sat   Lungs clear in all fields. Chest xray - clear today-Preliminary reading by Ronnald Collum, FNP  [facility]  Assessment:    Subtherapeutic INR for goal of 2.5-3.5   Plan:    1. New dose: Increase to 7.5mg  daily except 5mg  on m and f   2. Next INR: 2 weeks     Discussed covid vaccine- encouraged to go get second dose  Mary-Margaret Hassell Done, FNP

## 2019-08-27 DIAGNOSIS — Z23 Encounter for immunization: Secondary | ICD-10-CM | POA: Diagnosis not present

## 2019-08-30 ENCOUNTER — Other Ambulatory Visit: Payer: Self-pay

## 2019-08-31 ENCOUNTER — Ambulatory Visit (INDEPENDENT_AMBULATORY_CARE_PROVIDER_SITE_OTHER): Payer: Medicare Other | Admitting: Nurse Practitioner

## 2019-08-31 ENCOUNTER — Encounter: Payer: Self-pay | Admitting: Nurse Practitioner

## 2019-08-31 VITALS — BP 112/66 | HR 79 | Temp 97.8°F | Resp 20 | Ht 66.0 in | Wt 206.0 lb

## 2019-08-31 DIAGNOSIS — Z7901 Long term (current) use of anticoagulants: Secondary | ICD-10-CM

## 2019-08-31 LAB — COAGUCHEK XS/INR WAIVED
INR: 1.4 — ABNORMAL HIGH (ref 0.9–1.1)
Prothrombin Time: 17.2 s

## 2019-08-31 NOTE — Progress Notes (Signed)
Subjective:   Chief Complaint: INR recheck   Indication: CVA Bleeding signs/symptoms: None Thromboembolic signs/symptoms: None  Missed Coumadin doses: None Medication changes: no Dietary changes: no Bacterial/viral infection: no Other concerns: no  The following portions of the patient's history were reviewed and updated as appropriate: allergies, current medications, past family history, past medical history, past social history, past surgical history and problem list.  Review of Systems Pertinent items noted in HPI and remainder of comprehensive ROS otherwise negative.   Objective:    INR Today: 1.4 Current dose: coumadin 7.5mg  daily except 5mg  on mon and friday    Assessment:    Subtherapeutic INR for goal of 2.5-3.5   Plan:    1. New dose: coumadin 7.5mg  daily   2. Next INR: 2 weeks    Mary-Margaret Hassell Done, FNP

## 2019-09-06 DIAGNOSIS — I639 Cerebral infarction, unspecified: Secondary | ICD-10-CM | POA: Diagnosis not present

## 2019-09-06 DIAGNOSIS — E785 Hyperlipidemia, unspecified: Secondary | ICD-10-CM | POA: Diagnosis not present

## 2019-09-06 DIAGNOSIS — I6522 Occlusion and stenosis of left carotid artery: Secondary | ICD-10-CM | POA: Diagnosis not present

## 2019-09-06 DIAGNOSIS — I059 Rheumatic mitral valve disease, unspecified: Secondary | ICD-10-CM | POA: Diagnosis not present

## 2019-09-06 DIAGNOSIS — I10 Essential (primary) hypertension: Secondary | ICD-10-CM | POA: Diagnosis not present

## 2019-09-06 DIAGNOSIS — Z79899 Other long term (current) drug therapy: Secondary | ICD-10-CM | POA: Diagnosis not present

## 2019-09-06 DIAGNOSIS — Z7901 Long term (current) use of anticoagulants: Secondary | ICD-10-CM | POA: Diagnosis not present

## 2019-09-06 DIAGNOSIS — Z5181 Encounter for therapeutic drug level monitoring: Secondary | ICD-10-CM | POA: Diagnosis not present

## 2019-09-19 ENCOUNTER — Other Ambulatory Visit: Payer: Self-pay | Admitting: Nurse Practitioner

## 2019-09-19 ENCOUNTER — Other Ambulatory Visit: Payer: Self-pay

## 2019-09-19 ENCOUNTER — Ambulatory Visit (INDEPENDENT_AMBULATORY_CARE_PROVIDER_SITE_OTHER): Payer: Medicare Other | Admitting: Nurse Practitioner

## 2019-09-19 ENCOUNTER — Encounter: Payer: Self-pay | Admitting: Nurse Practitioner

## 2019-09-19 DIAGNOSIS — I6310 Cerebral infarction due to embolism of unspecified precerebral artery: Secondary | ICD-10-CM | POA: Diagnosis not present

## 2019-09-19 DIAGNOSIS — I6930 Unspecified sequelae of cerebral infarction: Secondary | ICD-10-CM

## 2019-09-19 DIAGNOSIS — R42 Dizziness and giddiness: Secondary | ICD-10-CM

## 2019-09-19 LAB — COAGUCHEK XS/INR WAIVED
INR: 2.5 — ABNORMAL HIGH (ref 0.9–1.1)
Prothrombin Time: 30.4 s

## 2019-09-19 NOTE — Progress Notes (Signed)
Subjective:   Chief Complaint: INR recheck   Indication: CVA Bleeding signs/symptoms: None Thromboembolic signs/symptoms: None  Missed Coumadin doses: None Medication changes: no Dietary changes: no Bacterial/viral infection: no Other concerns: no  The following portions of the patient's history were reviewed and updated as appropriate: allergies, current medications, past family history, past medical history, past social history, past surgical history and problem list.  Review of Systems Pertinent items noted in HPI and remainder of comprehensive ROS otherwise negative.   Objective:    INR Today: 2.5 Current dose: coumadin 7.5mg  daily    Assessment:    Therapeutic INR for goal of 2.5-3.5   Plan:    1. New dose: coumadin 7.5mg  daily except 10mg  on wednesdays   2. Next INR: 1 month    Mary-Margaret Hassell Done, FNP

## 2019-09-21 DIAGNOSIS — Z8673 Personal history of transient ischemic attack (TIA), and cerebral infarction without residual deficits: Secondary | ICD-10-CM | POA: Diagnosis not present

## 2019-09-21 DIAGNOSIS — Z6834 Body mass index (BMI) 34.0-34.9, adult: Secondary | ICD-10-CM | POA: Diagnosis not present

## 2019-09-21 DIAGNOSIS — I493 Ventricular premature depolarization: Secondary | ICD-10-CM | POA: Diagnosis not present

## 2019-09-21 DIAGNOSIS — Z7901 Long term (current) use of anticoagulants: Secondary | ICD-10-CM | POA: Diagnosis not present

## 2019-09-21 DIAGNOSIS — E669 Obesity, unspecified: Secondary | ICD-10-CM | POA: Diagnosis not present

## 2019-09-21 DIAGNOSIS — Z79899 Other long term (current) drug therapy: Secondary | ICD-10-CM | POA: Diagnosis not present

## 2019-09-21 DIAGNOSIS — I10 Essential (primary) hypertension: Secondary | ICD-10-CM | POA: Diagnosis not present

## 2019-09-21 DIAGNOSIS — I639 Cerebral infarction, unspecified: Secondary | ICD-10-CM | POA: Diagnosis not present

## 2019-09-21 DIAGNOSIS — Z7982 Long term (current) use of aspirin: Secondary | ICD-10-CM | POA: Diagnosis not present

## 2019-09-22 DIAGNOSIS — I493 Ventricular premature depolarization: Secondary | ICD-10-CM | POA: Insufficient documentation

## 2019-09-22 DIAGNOSIS — I639 Cerebral infarction, unspecified: Secondary | ICD-10-CM | POA: Insufficient documentation

## 2019-09-26 DIAGNOSIS — Z1231 Encounter for screening mammogram for malignant neoplasm of breast: Secondary | ICD-10-CM | POA: Diagnosis not present

## 2019-09-28 DIAGNOSIS — I83891 Varicose veins of right lower extremities with other complications: Secondary | ICD-10-CM | POA: Diagnosis not present

## 2019-10-19 ENCOUNTER — Encounter: Payer: Self-pay | Admitting: Nurse Practitioner

## 2019-10-19 ENCOUNTER — Ambulatory Visit (INDEPENDENT_AMBULATORY_CARE_PROVIDER_SITE_OTHER): Payer: Medicare Other | Admitting: Nurse Practitioner

## 2019-10-19 ENCOUNTER — Other Ambulatory Visit: Payer: Self-pay

## 2019-10-19 VITALS — BP 98/52 | HR 62 | Temp 98.0°F | Ht 66.0 in | Wt 214.0 lb

## 2019-10-19 DIAGNOSIS — I6310 Cerebral infarction due to embolism of unspecified precerebral artery: Secondary | ICD-10-CM

## 2019-10-19 LAB — COAGUCHEK XS/INR WAIVED
INR: 4.3 — ABNORMAL HIGH (ref 0.9–1.1)
Prothrombin Time: 51.4 s

## 2019-10-19 NOTE — Progress Notes (Signed)
Subjective:   Chief COmplaint: INR  Recheck   Indication: CVA Bleeding signs/symptoms: None Thromboembolic signs/symptoms: None  Missed Coumadin doses: None Medication changes: no Dietary changes: no Bacterial/viral infection: no Other concerns: no  The following portions of the patient's history were reviewed and updated as appropriate: allergies, current medications, past family history, past medical history, past social history, past surgical history and problem list.  Review of Systems Pertinent items noted in HPI and remainder of comprehensive ROS otherwise negative.   Objective:    INR Today: 4.3 Current dose: coumadin 7.5mg  daily except 10mg  on wed     Assessment:    Supratherapeutic INR for goal of 2.5-3.5   Plan:    1. New dose: hold tomorrow dose then coumadin 7.5mg  daily   2. Next INR: 2 weeks    Mary-Margaret Hassell Done, FNP

## 2019-10-19 NOTE — Addendum Note (Signed)
Addended by: Earlene Plater on: 10/19/2019 09:29 AM   Modules accepted: Orders

## 2019-10-20 ENCOUNTER — Ambulatory Visit: Payer: Self-pay | Admitting: Nurse Practitioner

## 2019-10-24 DIAGNOSIS — M170 Bilateral primary osteoarthritis of knee: Secondary | ICD-10-CM | POA: Diagnosis not present

## 2019-10-24 DIAGNOSIS — M25561 Pain in right knee: Secondary | ICD-10-CM | POA: Diagnosis not present

## 2019-11-03 ENCOUNTER — Ambulatory Visit: Payer: Self-pay | Admitting: Nurse Practitioner

## 2019-11-07 ENCOUNTER — Other Ambulatory Visit: Payer: Self-pay

## 2019-11-07 ENCOUNTER — Ambulatory Visit (INDEPENDENT_AMBULATORY_CARE_PROVIDER_SITE_OTHER): Payer: Medicare Other | Admitting: Nurse Practitioner

## 2019-11-07 ENCOUNTER — Encounter: Payer: Self-pay | Admitting: Nurse Practitioner

## 2019-11-07 VITALS — BP 109/60 | HR 69 | Temp 98.2°F | Resp 20 | Ht 66.0 in | Wt 211.0 lb

## 2019-11-07 DIAGNOSIS — I6930 Unspecified sequelae of cerebral infarction: Secondary | ICD-10-CM | POA: Diagnosis not present

## 2019-11-07 DIAGNOSIS — Z7901 Long term (current) use of anticoagulants: Secondary | ICD-10-CM

## 2019-11-07 LAB — COAGUCHEK XS/INR WAIVED
INR: 5.4 (ref 0.9–1.1)
Prothrombin Time: 64.9 s

## 2019-11-07 NOTE — Progress Notes (Signed)
Subjective:   Chief Complaint: INR recheck   Indication: CVA Bleeding signs/symptoms: None Thromboembolic signs/symptoms: None  Missed Coumadin doses: None Medication changes: no Dietary changes: no Bacterial/viral infection: no Other concerns: no  The following portions of the patient's history were reviewed and updated as appropriate: allergies, current medications, past family history, past medical history, past social history, past surgical history and problem list.  Review of Systems Pertinent items noted in HPI and remainder of comprehensive ROS otherwise negative.   Objective:    INR Today: 5.4 Current dose: coumadin 7.5mg  daily    Assessment:    Supratherapeutic INR for goal of 2.5-3.5   Plan:    1. New dose: Hold tomorrow then Coumadin 5mg  thurs and Friday, 7.5mg  on at and sun- recheck monday   2. Next INR: Monday  Mary-Margaret Hassell Done, FNP

## 2019-11-13 ENCOUNTER — Encounter: Payer: Self-pay | Admitting: Nurse Practitioner

## 2019-11-13 ENCOUNTER — Ambulatory Visit (INDEPENDENT_AMBULATORY_CARE_PROVIDER_SITE_OTHER): Payer: Medicare Other | Admitting: Nurse Practitioner

## 2019-11-13 ENCOUNTER — Other Ambulatory Visit: Payer: Self-pay

## 2019-11-13 VITALS — BP 126/72 | HR 70 | Temp 98.1°F | Resp 20 | Ht 66.0 in | Wt 211.0 lb

## 2019-11-13 DIAGNOSIS — I6930 Unspecified sequelae of cerebral infarction: Secondary | ICD-10-CM

## 2019-11-13 DIAGNOSIS — Z7901 Long term (current) use of anticoagulants: Secondary | ICD-10-CM | POA: Diagnosis not present

## 2019-11-13 LAB — COAGUCHEK XS/INR WAIVED
INR: 2.3 — ABNORMAL HIGH (ref 0.9–1.1)
Prothrombin Time: 27.3 s

## 2019-11-13 NOTE — Progress Notes (Signed)
Subjective:    Chief Complaint; INR recheck   Indication: CVA Bleeding signs/symptoms: None Thromboembolic signs/symptoms: None  Missed Coumadin doses: None Medication changes: held doe one day lat week becaue INR wa 5.4 Dietary changes: no Bacterial/viral infection: no Other concerns: no  The following portions of the patient's history were reviewed and updated as appropriate: allergies, current medications, past family history, past medical history, past social history, past surgical history and problem list.  Review of Systems Pertinent items noted in HPI and remainder of comprehensive ROS otherwise negative.   Objective:    INR Today: 2.3 Current dose: coumadin 7.5mg  daily    Assessment:    Therapeutic INR for goal of 2.5-3.5   Plan:    1. New dose: coumadin 7.5mg  daily except 5mg  on m and thur  2. Next INR: 1 month    Mary-Margaret Hassell Done, FNP

## 2019-11-16 ENCOUNTER — Ambulatory Visit (INDEPENDENT_AMBULATORY_CARE_PROVIDER_SITE_OTHER): Payer: Medicare Other | Admitting: Nurse Practitioner

## 2019-11-16 ENCOUNTER — Encounter: Payer: Self-pay | Admitting: Nurse Practitioner

## 2019-11-16 ENCOUNTER — Other Ambulatory Visit: Payer: Self-pay

## 2019-11-16 VITALS — BP 111/65 | HR 65 | Temp 98.4°F | Resp 20 | Ht 66.0 in | Wt 208.0 lb

## 2019-11-16 DIAGNOSIS — I10 Essential (primary) hypertension: Secondary | ICD-10-CM | POA: Diagnosis not present

## 2019-11-16 DIAGNOSIS — E782 Mixed hyperlipidemia: Secondary | ICD-10-CM | POA: Diagnosis not present

## 2019-11-16 DIAGNOSIS — N1831 Chronic kidney disease, stage 3a: Secondary | ICD-10-CM

## 2019-11-16 DIAGNOSIS — E034 Atrophy of thyroid (acquired): Secondary | ICD-10-CM | POA: Diagnosis not present

## 2019-11-16 DIAGNOSIS — Z6837 Body mass index (BMI) 37.0-37.9, adult: Secondary | ICD-10-CM | POA: Diagnosis not present

## 2019-11-16 DIAGNOSIS — I6930 Unspecified sequelae of cerebral infarction: Secondary | ICD-10-CM | POA: Diagnosis not present

## 2019-11-16 DIAGNOSIS — R609 Edema, unspecified: Secondary | ICD-10-CM

## 2019-11-16 MED ORDER — FUROSEMIDE 40 MG PO TABS: 40.0000 mg | ORAL_TABLET | Freq: Every day | ORAL | 1 refills | Status: DC

## 2019-11-16 MED ORDER — ATORVASTATIN CALCIUM 40 MG PO TABS: 40.0000 mg | ORAL_TABLET | Freq: Every day | ORAL | 1 refills | Status: DC

## 2019-11-16 MED ORDER — METOPROLOL TARTRATE 25 MG PO TABS: 25.0000 mg | ORAL_TABLET | Freq: Two times a day (BID) | ORAL | 1 refills | Status: DC

## 2019-11-16 MED ORDER — LEVOTHYROXINE SODIUM 50 MCG PO TABS: 50.0000 ug | ORAL_TABLET | Freq: Every day | ORAL | 11 refills | Status: DC

## 2019-11-16 MED ORDER — WARFARIN SODIUM 5 MG PO TABS: ORAL_TABLET | ORAL | 1 refills | Status: DC

## 2019-11-16 MED ORDER — LOSARTAN POTASSIUM 25 MG PO TABS: 25.0000 mg | ORAL_TABLET | Freq: Every day | ORAL | 1 refills | Status: DC

## 2019-11-16 NOTE — Patient Instructions (Signed)
Edema  Edema is when you have too much fluid in your body or under your skin. Edema may make your legs, feet, and ankles swell up. Swelling is also common in looser tissues, like around your eyes. This is a common condition. It gets more common as you get older. There are many possible causes of edema. Eating too much salt (sodium) and being on your feet or sitting for a long time can cause edema in your legs, feet, and ankles. Hot weather may make edema worse. Edema is usually painless. Your skin may look swollen or shiny. Follow these instructions at home:  Keep the swollen body part raised (elevated) above the level of your heart when you are sitting or lying down.  Do not sit still or stand for a long time.  Do not wear tight clothes. Do not wear garters on your upper legs.  Exercise your legs. This can help the swelling go down.  Wear elastic bandages or support stockings as told by your doctor.  Eat a low-salt (low-sodium) diet to reduce fluid as told by your doctor.  Depending on the cause of your swelling, you may need to limit how much fluid you drink (fluid restriction).  Take over-the-counter and prescription medicines only as told by your doctor. Contact a doctor if:  Treatment is not working.  You have heart, liver, or kidney disease and have symptoms of edema.  You have sudden and unexplained weight gain. Get help right away if:  You have shortness of breath or chest pain.  You cannot breathe when you lie down.  You have pain, redness, or warmth in the swollen areas.  You have heart, liver, or kidney disease and get edema all of a sudden.  You have a fever and your symptoms get worse all of a sudden. Summary  Edema is when you have too much fluid in your body or under your skin.  Edema may make your legs, feet, and ankles swell up. Swelling is also common in looser tissues, like around your eyes.  Raise (elevate) the swollen body part above the level of your  heart when you are sitting or lying down.  Follow your doctor's instructions about diet and how much fluid you can drink (fluid restriction). This information is not intended to replace advice given to you by your health care provider. Make sure you discuss any questions you have with your health care provider. Document Revised: 06/11/2017 Document Reviewed: 06/26/2016 Elsevier Patient Education  2020 Elsevier Inc[employer]  

## 2019-11-16 NOTE — Progress Notes (Signed)
Subjective:    Patient ID: Kimberly Walsh, female    DOB: 06/24/1945, 74 y.o.   MRN: [id number]   Chief Complaint: Medical Management of Chronic Issues    HPI:  1. Essential hypertension, benign No c/o chest pain, sob or headache. Doe not check blood pressure at home. BP Readings from Last 3 Encounters:  11/13/19 126/72  11/07/19 109/60  10/19/19 (!) 98/52     2. Mixed hyperlipidemia Does not watch diet and does little to no exercise Lab Results  Component Value Date   CHOL 115 04/20/2019   HDL 35 (L) 04/20/2019   LDLCALC 56 04/20/2019   TRIG 134 04/20/2019   CHOLHDL 3.3 04/20/2019     3. Peripheral edema Ha ankle edema by the end of each day. Will usually resolve mostly during the night  4. Hypothyroidism due to acquired atrophy of thyroid No problems that aware of  5. Stage 3a chronic kidney disease Lab Results  Component Value Date   CREATININE 1.36 (H) 04/20/2019   BUN 26 04/20/2019   NA 140 04/20/2019   K 3.8 04/20/2019   CL 102 04/20/2019   CO2 26 04/20/2019     6. Late effect of cerebrovascular accident (CVA) No residual effects  7. BMI 37.0-37.9, adult No recent weight changes Wt Readings from Last 3 Encounters:  11/16/19 208 lb (94.3 kg)  11/13/19 211 lb (95.7 kg)  11/07/19 211 lb (95.7 kg)   BMI Readings from Last 3 Encounters:  11/16/19 33.57 kg/m  11/13/19 34.06 kg/m  11/07/19 34.06 kg/m       Outpatient Encounter Medications as of 11/16/2019  Medication Sig  . aspirin EC 81 MG tablet Take 81 mg by mouth daily.  Marland Kitchen atorvastatin (LIPITOR) 40 MG tablet Take 1 tablet (40 mg total) by mouth daily.  . furosemide (LASIX) 40 MG tablet Take 1 tablet (40 mg total) by mouth daily.  Marland Kitchen levothyroxine (SYNTHROID) 50 MCG tablet Take 1 tablet (50 mcg total) by mouth daily.  Marland Kitchen losartan (COZAAR) 25 MG tablet Take 1 tablet (25 mg total) by mouth daily.  . meclizine (ANTIVERT) 25 MG tablet TAKE 1 TABLET BY MOUTH EVERY 6 HOURS AS NEEDED FOR  VERTIGO  . metoprolol tartrate (LOPRESSOR) 25 MG tablet Take 1 tablet (25 mg total) by mouth 2 (two) times daily.  Marland Kitchen warfarin (COUMADIN) 5 MG tablet 1-2 tablets as directed daily     Past Surgical History:  Procedure Laterality Date  . basal carcinoma rt leg    . CHOLECYSTECTOMY    . FINGER SURGERY Right    Ring finger  . MIDDLE EAR SURGERY     Fungus  . TUBAL LIGATION    . VEIN SURGERY      Family History  Problem Relation Age of Onset  . COPD Father     New complaints: Fatigues since she had covid  Social history: Live by herself since her husband passed away earlier in  The year  Controlled substance contract: n/a    Review of Systems  Constitutional: Negative for diaphoresis.  Eyes: Negative for pain.  Respiratory: Negative for shortness of breath.   Cardiovascular: Negative for chest pain, palpitations and leg swelling.  Gastrointestinal: Negative for abdominal pain.  Endocrine: Negative for polydipsia.  Skin: Negative for rash.  Neurological: Negative for dizziness, weakness and headaches.  Hematological: Does not bruise/bleed easily.  All other systems reviewed and are negative.      Objective:   Physical Exam Vitals and nursing note  reviewed.  Constitutional:      General: She is not in acute distress.    Appearance: Normal appearance. She is well-developed.  HENT:     Head: Normocephalic.     Nose: Nose normal.  Eyes:     Pupils: Pupils are equal, round, and reactive to light.  Neck:     Vascular: No carotid bruit or JVD.  Cardiovascular:     Rate and Rhythm: Normal rate and regular rhythm.     Heart sounds: Normal heart sounds.  Pulmonary:     Effort: Pulmonary effort is normal. No respiratory distress.     Breath sounds: Normal breath sounds. No wheezing or rales.  Chest:     Chest wall: No tenderness.  Abdominal:     General: Bowel sounds are normal. There is no distension or abdominal bruit.     Palpations: Abdomen is soft. There is  no hepatomegaly, splenomegaly, mass or pulsatile mass.     Tenderness: There is no abdominal tenderness.  Musculoskeletal:        General: Normal range of motion.     Cervical back: Normal range of motion and neck supple.     Right lower leg: Edema (1+) present.     Left lower leg: Edema (1+) present.  Lymphadenopathy:     Cervical: No cervical adenopathy.  Skin:    General: Skin is warm and dry.  Neurological:     Mental Status: She is alert and oriented to person, place, and time.     Deep Tendon Reflexes: Reflexes are normal and symmetric.  Psychiatric:        Behavior: Behavior normal.        Thought Content: Thought content normal.        Judgment: Judgment normal.    Blood pressure 111/65, pulse 65, temperature 98.4 F (36.9 C), temperature source Temporal, resp. rate 20, height _0  (1.676 m), weight 208 lb (94.3 kg), SpO2 99 %.         Assessment & Plan:  CLOVA MORLOCK comes in today with chief complaint of Medical Management of Chronic Issues   Diagnosis and orders addressed:  1. Essential hypertension, benign Low sodium diet - losartan (COZAAR) 25 MG tablet; Take 1 tablet (25 mg total) by mouth daily.  Dispense: 90 tablet; Refill: 1 - metoprolol tartrate (LOPRESSOR) 25 MG tablet; Take 1 tablet (25 mg total) by mouth 2 (two) times daily.  Dispense: 180 tablet; Refill: 1 - CMP14+EGFR  2. Mixed hyperlipidemia Low fat diet - atorvastatin (LIPITOR) 40 MG tablet; Take 1 tablet (40 mg total) by mouth daily.  Dispense: 90 tablet; Refill: 1 - Lipid panel  3. Peripheral edema Elevate legs when sitting' wear compression hoe a much a possible when awake - furosemide (LASIX) 40 MG tablet; Take 1 tablet (40 mg total) by mouth daily.  Dispense: 90 tablet; Refill: 1  4. Hypothyroidism due to acquired atrophy of thyroid Labs pending - levothyroxine (SYNTHROID) 50 MCG tablet; Take 1 tablet (50 mcg total) by mouth daily.  Dispense: 30 tablet; Refill: 11 - Thyroid Panel  With TSH  5. Stage 3a chronic kidney disease Labs pneding  6. Late effect of cerebrovascular accident (CVA) - warfarin (COUMADIN) 5 MG tablet; 1-2 tablets as directed daily  Dispense: 180 tablet; Refill: 1 - CBC with Differential/Platelet  7. BMI 37.0-37.9, adult Discussed diet and exercise for person with BMI >25 Will recheck weight in 3-6 months   Labs pending Health Maintenance reviewed Diet and exercise  encouraged  Follow up plan: 6 months   Mary-Margaret Hassell Done, FNP

## 2019-11-17 LAB — CMP14+EGFR
ALT: 30 IU/L (ref 0–32)
AST: 32 IU/L (ref 0–40)
Albumin/Globulin Ratio: 1.3 (ref 1.2–2.2)
Albumin: 3.9 g/dL (ref 3.7–4.7)
Alkaline Phosphatase: 116 IU/L (ref 48–121)
BUN/Creatinine Ratio: 14 (ref 12–28)
BUN: 19 mg/dL (ref 8–27)
Bilirubin Total: 0.8 mg/dL (ref 0.0–1.2)
CO2: 27 mmol/L (ref 20–29)
Calcium: 9.2 mg/dL (ref 8.7–10.3)
Chloride: 100 mmol/L (ref 96–106)
Creatinine, Ser: 1.34 mg/dL — ABNORMAL HIGH (ref 0.57–1.00)
GFR calc Af Amer: 46 mL/min/{1.73_m2} — ABNORMAL LOW (ref >59)
GFR calc non Af Amer: 40 mL/min/{1.73_m2} — ABNORMAL LOW (ref >59)
Globulin, Total: 2.9 g/dL (ref 1.5–4.5)
Glucose: 83 mg/dL (ref 65–99)
Potassium: 3.5 mmol/L (ref 3.5–5.2)
Sodium: 140 mmol/L (ref 134–144)
Total Protein: 6.8 g/dL (ref 6.0–8.5)

## 2019-11-17 LAB — CBC WITH DIFFERENTIAL/PLATELET
Basophils Absolute: 0 10*3/uL (ref 0.0–0.2)
Basos: 1 %
EOS (ABSOLUTE): 0.2 10*3/uL (ref 0.0–0.4)
Eos: 3 %
Hematocrit: 42.7 % (ref 34.0–46.6)
Hemoglobin: 14.1 g/dL (ref 11.1–15.9)
Immature Grans (Abs): 0 10*3/uL (ref 0.0–0.1)
Immature Granulocytes: 0 %
Lymphocytes Absolute: 2.5 10*3/uL (ref 0.7–3.1)
Lymphs: 34 %
MCH: 28.1 pg (ref 26.6–33.0)
MCHC: 33 g/dL (ref 31.5–35.7)
MCV: 85 fL (ref 79–97)
Monocytes Absolute: 0.4 10*3/uL (ref 0.1–0.9)
Monocytes: 6 %
Neutrophils Absolute: 4.2 10*3/uL (ref 1.4–7.0)
Neutrophils: 56 %
Platelets: 171 10*3/uL (ref 150–450)
RBC: 5.02 x10E6/uL (ref 3.77–5.28)
RDW: 15.2 % (ref 11.7–15.4)
WBC: 7.4 10*3/uL (ref 3.4–10.8)

## 2019-11-17 LAB — THYROID PANEL WITH TSH
Free Thyroxine Index: 1.9 (ref 1.2–4.9)
T3 Uptake Ratio: 28 % (ref 24–39)
T4, Total: 6.8 ug/dL (ref 4.5–12.0)
TSH: 2.37 u[IU]/mL (ref 0.450–4.500)

## 2019-11-17 LAB — LIPID PANEL
Chol/HDL Ratio: 2.8 ratio (ref 0.0–4.4)
Cholesterol, Total: 116 mg/dL (ref 100–199)
HDL: 41 mg/dL (ref >39)
LDL Chol Calc (NIH): 56 mg/dL (ref 0–99)
Triglycerides: 99 mg/dL (ref 0–149)
VLDL Cholesterol Cal: 19 mg/dL (ref 5–40)

## 2019-12-11 ENCOUNTER — Ambulatory Visit (INDEPENDENT_AMBULATORY_CARE_PROVIDER_SITE_OTHER): Payer: Medicare Other | Admitting: Nurse Practitioner

## 2019-12-11 ENCOUNTER — Encounter: Payer: Self-pay | Admitting: Nurse Practitioner

## 2019-12-11 ENCOUNTER — Other Ambulatory Visit: Payer: Self-pay

## 2019-12-11 VITALS — BP 109/72 | HR 79 | Temp 98.4°F | Resp 20 | Ht 66.0 in | Wt 210.0 lb

## 2019-12-11 DIAGNOSIS — Z7901 Long term (current) use of anticoagulants: Secondary | ICD-10-CM

## 2019-12-11 DIAGNOSIS — I6930 Unspecified sequelae of cerebral infarction: Secondary | ICD-10-CM

## 2019-12-11 NOTE — Progress Notes (Signed)
Subjective:   Chief Complaint: INR recheck   Indication: CVA Bleeding signs/symptoms: None Thromboembolic signs/symptoms: None  Missed Coumadin doses: None Medication changes: no Dietary changes: no Bacterial/viral infection: no Other concerns: no  The following portions of the patient's history were reviewed and updated as appropriate: allergies, current medications, past family history, past medical history, past social history, past surgical history and problem list.  Review of Systems Pertinent items noted in HPI and remainder of comprehensive ROS otherwise negative.   Objective:    INR Today: 4.1 Current dose: coumadin 5mg  on mand thurs then 7.5mg  all other days    Assessment:    Supratherapeutic INR for goal of 2.5-3.5   Plan:    1. New dose: hold tomorrow then 5mg  on m,w and f and 7.5mg  all other days    2. Next INR: 2 weeks     Kimberly Hassell Done, FNP

## 2019-12-26 ENCOUNTER — Other Ambulatory Visit: Payer: Self-pay

## 2019-12-26 ENCOUNTER — Encounter: Payer: Self-pay | Admitting: Nurse Practitioner

## 2019-12-26 ENCOUNTER — Ambulatory Visit (INDEPENDENT_AMBULATORY_CARE_PROVIDER_SITE_OTHER): Payer: Medicare Other | Admitting: Nurse Practitioner

## 2019-12-26 VITALS — BP 119/61 | HR 72 | Temp 98.3°F | Resp 20 | Ht 66.0 in | Wt 215.0 lb

## 2019-12-26 DIAGNOSIS — Z7901 Long term (current) use of anticoagulants: Secondary | ICD-10-CM

## 2019-12-26 DIAGNOSIS — I6930 Unspecified sequelae of cerebral infarction: Secondary | ICD-10-CM | POA: Diagnosis not present

## 2019-12-26 LAB — COAGUCHEK XS/INR WAIVED
INR: 3.9 — ABNORMAL HIGH (ref 0.9–1.1)
Prothrombin Time: 46.4 s

## 2019-12-26 MED ORDER — CYCLOBENZAPRINE HCL 5 MG PO TABS
5.0000 mg | ORAL_TABLET | Freq: Three times a day (TID) | ORAL | 0 refills | Status: DC | PRN
Start: 2019-12-26 — End: 2020-10-25

## 2019-12-26 NOTE — Patient Instructions (Signed)
Vitamin K Foods and Warfarin Warfarin is a blood thinner (anticoagulant). Anticoagulant medicines help prevent the formation of blood clots. These medicines work by decreasing the activity of vitamin K, which promotes normal blood clotting. When you take warfarin, problems can occur from suddenly increasing or decreasing the amount of vitamin K that you eat from one day to the next. Problems may include:  Blood clots.  Bleeding. What general guidelines do I need to follow? To avoid problems when taking warfarin:  Eat a balanced diet that includes: ? Fresh fruits and vegetables. ? Whole grains. ? Low-fat dairy products. ? Lean proteins, such as fish, eggs, and lean cuts of meat.  Keep your intake of vitamin K consistent from day to day. To do this: ? Avoid eating large amounts of vitamin K one day and low amounts of vitamin K the next day. ? If you take a multivitamin that contains vitamin K, be sure to take it every day. ? Know which foods contain vitamin K. Use the lists below to understand serving sizes and the amount of vitamin K in one serving.  Avoid major changes in your diet. If you are going to change your diet, talk with your health care provider before making changes.  Work with a nutrition specialist (dietitian) to develop a meal plan that works best for you.  High vitamin K foods Foods that are high in vitamin K contain more than 100 mcg (micrograms) per serving. These include:  Broccoli (cooked) -  cup has 110 mcg.  Brussels sprouts (cooked) -  cup has 109 mcg.  Greens, beet (cooked) -  cup has 350 mcg.  Greens, collard (cooked) -  cup has 418 mcg.  Greens, turnip (cooked) -  cup has 265 mcg.  Green onions or scallions -  cup has 105 mcg.  Kale (fresh or frozen) -  cup has 531 mcg.  Parsley (raw) - 10 sprigs has 164 mcg.  Spinach (cooked) -  cup has 444 mcg.  Swiss chard (cooked) -  cup has 287 mcg. Moderate vitamin K foods Foods that have a  moderate amount of vitamin K contain 25-100 mcg per serving. These include:  Asparagus (cooked) - 5 spears have 38 mcg.  Black-eyed peas (dried) -  cup has 32 mcg.  Cabbage (cooked) -  cup has 37 mcg.  Kiwi fruit - 1 medium has 31 mcg.  Lettuce - 1 cup has 57-63 mcg.  Okra (frozen) -  cup has 44 mcg.  Prunes (dried) - 5 prunes have 25 mcg.  Watercress (raw) - 1 cup has 85 mcg. Low vitamin K foods Foods low in vitamin K contain less than 25 mcg per serving. These include:  Artichoke - 1 medium has 18 mcg.  Avocado - 1 oz. has 6 mcg.  Blueberries -  cup has 14 mcg.  Cabbage (raw) -  cup has 21 mcg.  Carrots (cooked) -  cup has 11 mcg.  Cauliflower (raw) -  cup has 11 mcg.  Cucumber with peel (raw) -  cup has 9 mcg.  Grapes -  cup has 12 mcg.  Mango - 1 medium has 9 mcg.  Nuts - 1 oz. has 15 mcg.  Pear - 1 medium has 8 mcg.  Peas (cooked) -  cup has 19 mcg.  Pickles - 1 spear has 14 mcg.  Pumpkin seeds - 1 oz. has 13 mcg.  Sauerkraut (canned) -  cup has 16 mcg.  Soybeans (cooked) -  cup has 16 mcg.    Tomato (raw) - 1 medium has 10 mcg.  Tomato sauce -  cup has 17 mcg. Vitamin K-free foods If a food contain less than 5 mcg per serving, it is considered to have no vitamin K. These foods include:  Bread and cereal products.  Cheese.  Eggs.  Fish and shellfish.  Meat and poultry.  Milk and dairy products.  Sunflower seeds. Actual amounts of vitamin K in foods may be different depending on processing. Talk with your dietitian about what foods you can eat and what foods you should avoid. This information is not intended to replace advice given to you by your health care provider. Make sure you discuss any questions you have with your health care provider. Document Revised: 05/21/2017 Document Reviewed: 09/11/2015 Elsevier Patient Education  2020 Elsevier Inc.  

## 2019-12-26 NOTE — Progress Notes (Signed)
Subjective:   Chief complaint: INR recheck   Indication: CVA Bleeding signs/symptoms: None Thromboembolic signs/symptoms: None  Missed Coumadin doses: None Medication changes: no Dietary changes: no Bacterial/viral infection: no Other concerns: no  The following portions of the patient's history were reviewed and updated as appropriate: allergies, current medications, past family history, past medical history, past social history, past surgical history and problem list.  Review of Systems Pertinent items noted in HPI and remainder of comprehensive ROS otherwise negative.   Objective:    INR Today: 3.9 Current dose: coumadin 7.5mg  sun, thu and sat and 5mg  all other days.    Assessment:    Supratherapeutic INR for goal of 2.5-3.5   Plan:    1. New dose: coumadin hold tomorrow then 5mg  daily excepot 7.5mg  on thurs and sun    2. Next INR: 2 weeks    Mary-Margaret Hassell Done, FNP

## 2020-01-09 ENCOUNTER — Other Ambulatory Visit: Payer: Self-pay

## 2020-01-09 ENCOUNTER — Ambulatory Visit (INDEPENDENT_AMBULATORY_CARE_PROVIDER_SITE_OTHER): Payer: Medicare Other | Admitting: Nurse Practitioner

## 2020-01-09 ENCOUNTER — Encounter: Payer: Self-pay | Admitting: Nurse Practitioner

## 2020-01-09 VITALS — BP 120/60 | HR 63 | Temp 97.4°F | Ht 66.0 in | Wt 214.6 lb

## 2020-01-09 DIAGNOSIS — Z7901 Long term (current) use of anticoagulants: Secondary | ICD-10-CM

## 2020-01-09 DIAGNOSIS — I6930 Unspecified sequelae of cerebral infarction: Secondary | ICD-10-CM

## 2020-01-09 LAB — COAGUCHEK XS/INR WAIVED
INR: 3.1 — ABNORMAL HIGH (ref 0.9–1.1)
Prothrombin Time: 37.4 s

## 2020-01-09 NOTE — Progress Notes (Signed)
Subjective:   Chief Complaint- INR recheck   Indication: CVA Bleeding signs/symptoms: None Thromboembolic signs/symptoms: None  Missed Coumadin doses: None Medication changes: no Dietary changes: no Bacterial/viral infection: no Other concerns: no  The following portions of the patient's history were reviewed and updated as appropriate: allergies, current medications, past family history, past medical history, past social history, past surgical history and problem list.  Review of Systems Pertinent items noted in HPI and remainder of comprehensive ROS otherwise negative.   Objective:    INR Today: 3.1 Current dose: coumadin 5mg  daily except 7.5Mg  on thurs and sun    Assessment:    Therapeutic INR for goal of 2.5-3.5   Plan:    1. New dose: no change   2. Next INR: 1 month    Mary-Margaret Hassell Done, FNP

## 2020-01-21 ENCOUNTER — Other Ambulatory Visit: Payer: Self-pay | Admitting: Nurse Practitioner

## 2020-01-21 DIAGNOSIS — E782 Mixed hyperlipidemia: Secondary | ICD-10-CM

## 2020-02-06 ENCOUNTER — Encounter: Payer: Self-pay | Admitting: Nurse Practitioner

## 2020-02-06 ENCOUNTER — Other Ambulatory Visit: Payer: Self-pay

## 2020-02-06 ENCOUNTER — Ambulatory Visit (INDEPENDENT_AMBULATORY_CARE_PROVIDER_SITE_OTHER): Payer: Medicare Other | Admitting: Nurse Practitioner

## 2020-02-06 VITALS — BP 119/67 | HR 89 | Temp 97.9°F | Resp 20 | Ht 66.0 in | Wt 215.0 lb

## 2020-02-06 DIAGNOSIS — I6930 Unspecified sequelae of cerebral infarction: Secondary | ICD-10-CM | POA: Diagnosis not present

## 2020-02-06 DIAGNOSIS — Z7901 Long term (current) use of anticoagulants: Secondary | ICD-10-CM | POA: Diagnosis not present

## 2020-02-06 LAB — COAGUCHEK XS/INR WAIVED
INR: 2.7 — ABNORMAL HIGH (ref 0.9–1.1)
Prothrombin Time: 32.5 s

## 2020-02-06 NOTE — Progress Notes (Signed)
Subjective:   chief complaint: INR recheck    Indication: CVA Bleeding signs/symptoms: None Thromboembolic signs/symptoms: None  Missed Coumadin doses: None Medication changes: no Dietary changes: no Bacterial/viral infection: no Other concerns: no  The following portions of the patient's history were reviewed and updated as appropriate: allergies, current medications, past family history, past medical history, past social history, past surgical history and problem list.  Review of Systems Pertinent items noted in HPI and remainder of comprehensive ROS otherwise negative.   Objective:    INR Today: 2.7 Current dose: coumadin 5mg  daily  Except 7.5mg  on sun and thurs    Assessment:    Therapeutic INR for goal of 2.5-3.5   Plan:    1. New dose: no change   2. Next INR: 1 month    Mary-Margaret Hassell Done, FNP

## 2020-02-19 ENCOUNTER — Ambulatory Visit: Payer: Self-pay | Admitting: Nurse Practitioner

## 2020-03-05 ENCOUNTER — Encounter: Payer: Self-pay | Admitting: Nurse Practitioner

## 2020-03-05 ENCOUNTER — Ambulatory Visit (INDEPENDENT_AMBULATORY_CARE_PROVIDER_SITE_OTHER): Payer: Medicare Other | Admitting: Nurse Practitioner

## 2020-03-05 ENCOUNTER — Other Ambulatory Visit: Payer: Self-pay

## 2020-03-05 VITALS — BP 112/61 | HR 67 | Temp 97.3°F | Resp 20 | Ht 66.0 in | Wt 213.0 lb

## 2020-03-05 DIAGNOSIS — I6930 Unspecified sequelae of cerebral infarction: Secondary | ICD-10-CM | POA: Diagnosis not present

## 2020-03-05 DIAGNOSIS — Z7901 Long term (current) use of anticoagulants: Secondary | ICD-10-CM | POA: Diagnosis not present

## 2020-03-05 LAB — COAGUCHEK XS/INR WAIVED
INR: 3 — ABNORMAL HIGH (ref 0.9–1.1)
Prothrombin Time: 36.3 s

## 2020-03-05 NOTE — Progress Notes (Signed)
Subjective:   Chief COmplaint: INR recheck   Indication: CVA Bleeding signs/symptoms: None Thromboembolic signs/symptoms: None  Missed Coumadin doses: None Medication changes: no Dietary changes: no Bacterial/viral infection: no Other concerns: no  The following portions of the patient's history were reviewed and updated as appropriate: allergies, current medications, past family history, past medical history, past social history, past surgical history and problem list.  Review of Systems Pertinent items noted in HPI and remainder of comprehensive ROS otherwise negative.   Objective:    INR Today: 3.0 Current dose: coumadin 5mg  daily except 7.5mg  on Sunday and thursday    Assessment:    Therapeutic INR for goal of 2.5-3.5   Plan:    1. New dose: no change   2. Next INR: 1 month    Mary-Margaret Hassell Done, FNP

## 2020-03-13 DIAGNOSIS — R7871 Abnormal lead level in blood: Secondary | ICD-10-CM | POA: Diagnosis not present

## 2020-03-13 DIAGNOSIS — R5383 Other fatigue: Secondary | ICD-10-CM | POA: Diagnosis not present

## 2020-03-13 DIAGNOSIS — Z7982 Long term (current) use of aspirin: Secondary | ICD-10-CM | POA: Diagnosis not present

## 2020-03-13 DIAGNOSIS — I6529 Occlusion and stenosis of unspecified carotid artery: Secondary | ICD-10-CM | POA: Diagnosis not present

## 2020-03-13 DIAGNOSIS — I6503 Occlusion and stenosis of bilateral vertebral arteries: Secondary | ICD-10-CM | POA: Diagnosis not present

## 2020-03-13 DIAGNOSIS — Z79899 Other long term (current) drug therapy: Secondary | ICD-10-CM | POA: Diagnosis not present

## 2020-04-08 ENCOUNTER — Other Ambulatory Visit: Payer: Self-pay

## 2020-04-08 ENCOUNTER — Ambulatory Visit (INDEPENDENT_AMBULATORY_CARE_PROVIDER_SITE_OTHER): Payer: Medicare Other | Admitting: Nurse Practitioner

## 2020-04-08 ENCOUNTER — Encounter: Payer: Self-pay | Admitting: Nurse Practitioner

## 2020-04-08 VITALS — BP 112/61 | HR 62 | Temp 97.9°F | Resp 20 | Ht 66.0 in | Wt 218.0 lb

## 2020-04-08 DIAGNOSIS — R42 Dizziness and giddiness: Secondary | ICD-10-CM

## 2020-04-08 DIAGNOSIS — Z7901 Long term (current) use of anticoagulants: Secondary | ICD-10-CM | POA: Diagnosis not present

## 2020-04-08 LAB — COAGUCHEK XS/INR WAIVED
INR: 2.2 — ABNORMAL HIGH (ref 0.9–1.1)
Prothrombin Time: 26.2 s

## 2020-04-08 MED ORDER — MECLIZINE HCL 25 MG PO TABS: ORAL_TABLET | ORAL | 2 refills | Status: DC

## 2020-04-08 NOTE — Progress Notes (Signed)
Subjective:   chief Complaint: INR recheck   Indication: PE Bleeding signs/symptoms: None Thromboembolic signs/symptoms: None  Missed Coumadin doses: None Medication changes: no Dietary changes: no Bacterial/viral infection: no Other concerns: no  The following portions of the patient's history were reviewed and updated as appropriate: allergies, current medications, past family history, past medical history, past social history, past surgical history and problem list.  Review of Systems Pertinent items noted in HPI and remainder of comprehensive ROS otherwise negative.   Objective:    INR Today: 2.2 Current dose: coumacin 5mg  daily exept 7.5mg  on sun and thurs    Assessment:    Therapeutic INR for goal of 2.5-3.5   Plan:    1. New dose: coumadin 5mg  daily except 7.5mg  on Sun, Tues, and thurs   2. Next INR: 2 weeks     Mary-Margaret Hassell Done, FNP

## 2020-04-23 ENCOUNTER — Encounter: Payer: Self-pay | Admitting: Nurse Practitioner

## 2020-04-23 ENCOUNTER — Other Ambulatory Visit: Payer: Self-pay

## 2020-04-23 ENCOUNTER — Ambulatory Visit (INDEPENDENT_AMBULATORY_CARE_PROVIDER_SITE_OTHER): Payer: Medicare Other | Admitting: Nurse Practitioner

## 2020-04-23 VITALS — BP 111/63 | HR 73 | Temp 97.2°F | Resp 20 | Ht 66.0 in | Wt 216.0 lb

## 2020-04-23 DIAGNOSIS — I83891 Varicose veins of right lower extremities with other complications: Secondary | ICD-10-CM | POA: Diagnosis not present

## 2020-04-23 DIAGNOSIS — I83892 Varicose veins of left lower extremities with other complications: Secondary | ICD-10-CM | POA: Diagnosis not present

## 2020-04-23 DIAGNOSIS — Z7901 Long term (current) use of anticoagulants: Secondary | ICD-10-CM | POA: Diagnosis not present

## 2020-04-23 DIAGNOSIS — I872 Venous insufficiency (chronic) (peripheral): Secondary | ICD-10-CM | POA: Diagnosis not present

## 2020-04-23 DIAGNOSIS — I6930 Unspecified sequelae of cerebral infarction: Secondary | ICD-10-CM

## 2020-04-23 LAB — COAGUCHEK XS/INR WAIVED
INR: 1.7 — ABNORMAL HIGH (ref 0.9–1.1)
Prothrombin Time: 20.3 s

## 2020-04-23 NOTE — Addendum Note (Signed)
Addended by: Rolena Infante on: 04/23/2020 12:02 PM   Modules accepted: Orders

## 2020-04-23 NOTE — Progress Notes (Signed)
Subjective:   Chief complaint; INR reheck   Indication: CVA Bleeding signs/symptoms: None Thromboembolic signs/symptoms: None  Missed Coumadin doses: None Medication changes: no Dietary changes: no Bacterial/viral infection: no Other concerns: no  The following portions of the patient's history were reviewed and updated as appropriate: allergies, current medications, past family history, past medical history, past social history, past surgical history and problem list.  Review of Systems Pertinent items noted in HPI and remainder of comprehensive ROS otherwise negative.   Objective:    INR Today: 1.7 Current dose: coumadin 72m daily except 75mg  on Tues, Thurs and sun.  Assessment:    Therapeutic INR for goal of 2.5-3.5   Plan:    1. New dose: Coumadin 7.5mg  daily exceot 5mg  on wed and sat   2. Next INR: 2 weeks    Mary-Margaret Hassell Done, FNP

## 2020-05-02 ENCOUNTER — Other Ambulatory Visit: Payer: Self-pay | Admitting: Nurse Practitioner

## 2020-05-02 DIAGNOSIS — E782 Mixed hyperlipidemia: Secondary | ICD-10-CM

## 2020-05-02 DIAGNOSIS — I10 Essential (primary) hypertension: Secondary | ICD-10-CM

## 2020-05-03 ENCOUNTER — Ambulatory Visit: Payer: Self-pay | Admitting: Nurse Practitioner

## 2020-05-07 ENCOUNTER — Ambulatory Visit (INDEPENDENT_AMBULATORY_CARE_PROVIDER_SITE_OTHER): Payer: Medicare Other | Admitting: Nurse Practitioner

## 2020-05-07 ENCOUNTER — Other Ambulatory Visit: Payer: Self-pay

## 2020-05-07 ENCOUNTER — Encounter: Payer: Self-pay | Admitting: Nurse Practitioner

## 2020-05-07 VITALS — BP 122/66 | HR 55 | Temp 97.4°F | Resp 20 | Ht 66.0 in | Wt 216.0 lb

## 2020-05-07 DIAGNOSIS — I6930 Unspecified sequelae of cerebral infarction: Secondary | ICD-10-CM

## 2020-05-07 DIAGNOSIS — Z7901 Long term (current) use of anticoagulants: Secondary | ICD-10-CM

## 2020-05-07 LAB — COAGUCHEK XS/INR WAIVED
INR: 3.1 — ABNORMAL HIGH (ref 0.9–1.1)
Prothrombin Time: 37.8 s

## 2020-05-07 NOTE — Progress Notes (Signed)
Subjective:   CHief COmplaint: INR recheck    Indication: CVA Bleeding signs/symptoms: None Thromboembolic signs/symptoms: None  Missed Coumadin doses: None Medication changes: no Dietary changes: no Bacterial/viral infection: no Other concerns: no  The following portions of the patient's history were reviewed and updated as appropriate: allergies, current medications, past family history, past medical history, past social history, past surgical history and problem list.  Review of Systems Pertinent items noted in HPI and remainder of comprehensive ROS otherwise negative.   Objective:    INR Today: 3.1 Current dose: coumadin 7.5mg  daily except 5mg  on wed and fri  Assessment:    Therapeutic INR for goal of 2.5-3.5   Plan:    1. New dose: no change   2. Next INR: 1 month     Mary-Margaret Hassell Done, FNP

## 2020-05-15 ENCOUNTER — Other Ambulatory Visit: Payer: Self-pay | Admitting: Nurse Practitioner

## 2020-05-15 DIAGNOSIS — I10 Essential (primary) hypertension: Secondary | ICD-10-CM

## 2020-06-07 ENCOUNTER — Other Ambulatory Visit: Payer: Self-pay

## 2020-06-07 ENCOUNTER — Ambulatory Visit (INDEPENDENT_AMBULATORY_CARE_PROVIDER_SITE_OTHER): Payer: Medicare Other | Admitting: Nurse Practitioner

## 2020-06-07 ENCOUNTER — Encounter: Payer: Self-pay | Admitting: Nurse Practitioner

## 2020-06-07 VITALS — BP 118/64 | HR 88 | Temp 97.8°F | Resp 20 | Ht 66.0 in | Wt 220.0 lb

## 2020-06-07 DIAGNOSIS — M2550 Pain in unspecified joint: Secondary | ICD-10-CM

## 2020-06-07 DIAGNOSIS — Z7901 Long term (current) use of anticoagulants: Secondary | ICD-10-CM

## 2020-06-07 DIAGNOSIS — I6930 Unspecified sequelae of cerebral infarction: Secondary | ICD-10-CM

## 2020-06-07 LAB — COAGUCHEK XS/INR WAIVED
INR: 3.4 — ABNORMAL HIGH (ref 0.9–1.1)
Prothrombin Time: 40.5 s

## 2020-06-07 NOTE — Addendum Note (Signed)
Addended by: Rolena Infante on: 06/07/2020 03:47 PM   Modules accepted: Orders

## 2020-06-07 NOTE — Progress Notes (Signed)
Subjective:    CHief Complaint: INR recheck    Indication: PE Bleeding signs/symptoms: None Thromboembolic signs/symptoms: None  Missed Coumadin doses: None Medication changes: no Dietary changes: no Bacterial/viral infection: no Other concerns: no  The following portions of the patient's history were reviewed and updated as appropriate: allergies, current medications, past family history, past medical history, past social history, past surgical history and problem list.   * Patient has been having all over joint pain for several years and would like blood work done for [employer].  Review of Systems Pertinent items noted in HPI and remainder of comprehensive ROS otherwise negative.   Objective:    INR Today: 3.4 Current dose: coumadin 7.5mg  daily except 5mg  on wed and sat    Assessment:    Therapeutic INR for goal of 2.5-3.5   Plan:    1. New dose: no change   2. Next INR: 1 month  Mary-Margaret Hassell Done, FNP

## 2020-06-07 NOTE — Addendum Note (Signed)
Addended by: Rolena Infante on: 06/07/2020 04:04 PM   Modules accepted: Orders

## 2020-06-08 LAB — ARTHRITIS PANEL
Basophils Absolute: 0 10*3/uL (ref 0.0–0.2)
Basos: 0 %
EOS (ABSOLUTE): 0.2 10*3/uL (ref 0.0–0.4)
Eos: 3 %
Hematocrit: 41.1 % (ref 34.0–46.6)
Hemoglobin: 13.5 g/dL (ref 11.1–15.9)
Immature Grans (Abs): 0 10*3/uL (ref 0.0–0.1)
Immature Granulocytes: 0 %
Lymphocytes Absolute: 2.6 10*3/uL (ref 0.7–3.1)
Lymphs: 37 %
MCH: 28.4 pg (ref 26.6–33.0)
MCHC: 32.8 g/dL (ref 31.5–35.7)
MCV: 86 fL (ref 79–97)
Monocytes Absolute: 0.3 10*3/uL (ref 0.1–0.9)
Monocytes: 5 %
Neutrophils Absolute: 3.8 10*3/uL (ref 1.4–7.0)
Neutrophils: 55 %
Platelets: 164 10*3/uL (ref 150–450)
RBC: 4.76 x10E6/uL (ref 3.77–5.28)
RDW: 13.6 % (ref 11.7–15.4)
Rheumatoid fact SerPl-aCnc: <10 IU/mL (ref <14.0)
Sed Rate: 5 mm/hr (ref 0–40)
Uric Acid: 7.6 mg/dL (ref 3.1–7.9)
WBC: 6.9 10*3/uL (ref 3.4–10.8)

## 2020-06-08 LAB — C-REACTIVE PROTEIN: CRP: 3 mg/L (ref 0–10)

## 2020-06-11 ENCOUNTER — Telehealth: Payer: Self-pay

## 2020-06-11 NOTE — Telephone Encounter (Signed)
Patient contacted office wanting arthritis results from [facility]. Advised that labs were negative. She states that her knees are hurting so bad and she can't walk. Wants to know what you think she should do next?

## 2020-06-11 NOTE — Telephone Encounter (Signed)
Nees to see ortho

## 2020-06-11 NOTE — Telephone Encounter (Signed)
Left message informing patient that Kimberly Walsh suggest that she she ortho. No referral has been placed at this time. Instructed patient to call back to let us know if she already sees ortho or if she would like for [place] to put in a referral.

## 2020-06-13 ENCOUNTER — Ambulatory Visit: Payer: Medicare Other

## 2020-07-01 DIAGNOSIS — M2550 Pain in unspecified joint: Secondary | ICD-10-CM | POA: Diagnosis not present

## 2020-07-01 DIAGNOSIS — Z23 Encounter for immunization: Secondary | ICD-10-CM | POA: Diagnosis not present

## 2020-07-01 DIAGNOSIS — E669 Obesity, unspecified: Secondary | ICD-10-CM | POA: Diagnosis not present

## 2020-07-01 DIAGNOSIS — M150 Primary generalized (osteo)arthritis: Secondary | ICD-10-CM | POA: Diagnosis not present

## 2020-07-01 DIAGNOSIS — Z6835 Body mass index (BMI) 35.0-35.9, adult: Secondary | ICD-10-CM | POA: Diagnosis not present

## 2020-07-01 DIAGNOSIS — N1831 Chronic kidney disease, stage 3a: Secondary | ICD-10-CM | POA: Diagnosis not present

## 2020-07-05 ENCOUNTER — Ambulatory Visit: Payer: Medicare Other | Admitting: Nurse Practitioner

## 2020-07-15 ENCOUNTER — Encounter: Payer: Self-pay | Admitting: Nurse Practitioner

## 2020-07-15 ENCOUNTER — Other Ambulatory Visit: Payer: Self-pay

## 2020-07-15 ENCOUNTER — Ambulatory Visit (INDEPENDENT_AMBULATORY_CARE_PROVIDER_SITE_OTHER): Payer: Medicare Other | Admitting: Nurse Practitioner

## 2020-07-15 VITALS — BP 121/64 | HR 71 | Temp 97.3°F | Resp 20 | Ht 66.0 in | Wt 217.0 lb

## 2020-07-15 DIAGNOSIS — Z7901 Long term (current) use of anticoagulants: Secondary | ICD-10-CM | POA: Diagnosis not present

## 2020-07-15 NOTE — Progress Notes (Signed)
Subjective:   Chief COmplaint: INR recheck   Indication: CVA Bleeding signs/symptoms: None Thromboembolic signs/symptoms: None  Missed Coumadin doses: None Medication changes: no Dietary changes: no Bacterial/viral infection: no Other concerns: no  The following portions of the patient's history were reviewed and updated as appropriate: allergies, current medications, past family history, past medical history, past social history, past surgical history and problem list.  Review of Systems Pertinent items noted in HPI and remainder of comprehensive ROS otherwise negative.   Objective:    INR Today: 5.3 Current dose: coumadin 7.5mg  daily except 5mg  on wed and sat    Assessment:    Supratherapeutic INR for goal of 2.5-3.5   Plan:    1. New dose: hold for 2 days then back to normal dose   2. Next INR: 1 week    Mary-Margaret Hassell Done, FNP

## 2020-07-16 LAB — COAGUCHEK XS/INR WAIVED
INR: 5.3 (ref 0.9–1.1)
Prothrombin Time: 63.8 s

## 2020-07-22 ENCOUNTER — Encounter: Payer: Self-pay | Admitting: Nurse Practitioner

## 2020-07-22 ENCOUNTER — Ambulatory Visit (INDEPENDENT_AMBULATORY_CARE_PROVIDER_SITE_OTHER): Payer: Medicare Other | Admitting: Nurse Practitioner

## 2020-07-22 ENCOUNTER — Other Ambulatory Visit: Payer: Self-pay

## 2020-07-22 VITALS — BP 109/62 | HR 74 | Temp 97.7°F | Resp 20 | Ht 66.0 in | Wt 218.0 lb

## 2020-07-22 DIAGNOSIS — Z7901 Long term (current) use of anticoagulants: Secondary | ICD-10-CM

## 2020-07-22 LAB — COAGUCHEK XS/INR WAIVED
INR: 2.9 — ABNORMAL HIGH (ref 0.9–1.1)
Prothrombin Time: 35.3 s

## 2020-07-22 NOTE — Progress Notes (Signed)
Subjective:   Chief complaint: INR recheck   Indication: CVA Bleeding signs/symptoms: None Thromboembolic signs/symptoms: None  Missed Coumadin doses: None Medication changes: no Dietary changes: no Bacterial/viral infection: no Other concerns: no  The following portions of the patient's history were reviewed and updated as appropriate: allergies, current medications, past family history, past medical history, past social history, past surgical history and problem list.  Review of Systems Pertinent items noted in HPI and remainder of comprehensive ROS otherwise negative.   Objective:    INR Today: 2.9 Current dose: Coumadin 7.5mg  daily except 5mg  on wed and sat    Assessment:    Therapeutic INR for goal of 2.5-3.5   Plan:    1. New dose: no change   2. Next INR: 1 month    Mary-Margaret Hassell Done, FNP

## 2020-07-25 ENCOUNTER — Other Ambulatory Visit: Payer: Self-pay | Admitting: Nurse Practitioner

## 2020-07-25 DIAGNOSIS — R609 Edema, unspecified: Secondary | ICD-10-CM

## 2020-08-09 ENCOUNTER — Other Ambulatory Visit: Payer: Self-pay | Admitting: Nurse Practitioner

## 2020-08-09 DIAGNOSIS — E782 Mixed hyperlipidemia: Secondary | ICD-10-CM

## 2020-08-19 ENCOUNTER — Encounter: Payer: Self-pay | Admitting: Nurse Practitioner

## 2020-08-19 ENCOUNTER — Ambulatory Visit (INDEPENDENT_AMBULATORY_CARE_PROVIDER_SITE_OTHER): Payer: Medicare Other | Admitting: Nurse Practitioner

## 2020-08-19 ENCOUNTER — Other Ambulatory Visit: Payer: Self-pay

## 2020-08-19 VITALS — BP 116/60 | HR 73 | Temp 97.9°F | Resp 20 | Ht 66.0 in | Wt 220.0 lb

## 2020-08-19 DIAGNOSIS — Z7901 Long term (current) use of anticoagulants: Secondary | ICD-10-CM

## 2020-08-19 LAB — COAGUCHEK XS/INR WAIVED
INR: 4.9 — ABNORMAL HIGH (ref 0.9–1.1)
Prothrombin Time: 58.8 s

## 2020-08-19 NOTE — Progress Notes (Signed)
Subjective:   Chief Complaint:  INR recheck   Indication: CVA Bleeding signs/symptoms: None Thromboembolic signs/symptoms: None  Missed Coumadin doses: None Medication changes: no Dietary changes: no Bacterial/viral infection: no Other concerns: no  The following portions of the patient's history were reviewed and updated as appropriate: allergies, current medications, past family history, past medical history, past social history, past surgical history and problem list.  Review of Systems Pertinent items noted in HPI and remainder of comprehensive ROS otherwise negative.   Objective:    INR Today: 4.9 Current dose: coumadin 7.5mg  daily except 5mg  on wed and sat    Assessment:    Supratherapeutic INR for goal of 2.5-3.5   Plan:    1. New dose: hold tomorrow then back on regular dose of 73mg  daily except 5mg  on Wed and sat   2. Next INR: 1 week    Mary-Margaret Hassell Done, FNP

## 2020-08-22 ENCOUNTER — Ambulatory Visit: Payer: Self-pay | Admitting: Nurse Practitioner

## 2020-08-26 ENCOUNTER — Ambulatory Visit: Payer: Medicare Other | Admitting: Nurse Practitioner

## 2020-08-27 ENCOUNTER — Other Ambulatory Visit: Payer: Self-pay

## 2020-08-27 ENCOUNTER — Encounter: Payer: Self-pay | Admitting: Nurse Practitioner

## 2020-08-27 ENCOUNTER — Ambulatory Visit (INDEPENDENT_AMBULATORY_CARE_PROVIDER_SITE_OTHER): Payer: Medicare Other | Admitting: Nurse Practitioner

## 2020-08-27 VITALS — BP 104/63 | HR 73 | Temp 97.5°F | Ht 66.0 in | Wt 220.0 lb

## 2020-08-27 DIAGNOSIS — Z7901 Long term (current) use of anticoagulants: Secondary | ICD-10-CM

## 2020-08-27 LAB — COAGUCHEK XS/INR WAIVED
INR: 2.9 — ABNORMAL HIGH (ref 0.9–1.1)
Prothrombin Time: 35.2 s

## 2020-08-27 NOTE — Progress Notes (Signed)
Subjective:   Chief Complaint- INR recheck   Indication: CVA Bleeding signs/symptoms: None Thromboembolic signs/symptoms: None  Missed Coumadin doses: None Medication changes: no Dietary changes: no Bacterial/viral infection: no Other concerns: no  The following portions of the patient's history were reviewed and updated as appropriate: allergies, current medications, past family history, past medical history, past social history, past surgical history and problem list.  Review of Systems Pertinent items noted in HPI and remainder of comprehensive ROS otherwise negative.   Objective:    INR Today: 2.9 Current dose: Continue Coumadin 7.5mg  daily except 5mg  on wed and sat    Assessment:    Therapeutic INR for goal of 2.5-3.5   Plan:    1. New dose: no change   2. Next INR: 1 month    Mary-Margaret Hassell Done, FNP

## 2020-09-24 ENCOUNTER — Encounter: Payer: Self-pay | Admitting: Nurse Practitioner

## 2020-09-24 ENCOUNTER — Other Ambulatory Visit: Payer: Self-pay

## 2020-09-24 ENCOUNTER — Ambulatory Visit (INDEPENDENT_AMBULATORY_CARE_PROVIDER_SITE_OTHER): Payer: Medicare Other | Admitting: Nurse Practitioner

## 2020-09-24 VITALS — BP 110/66 | HR 67 | Temp 98.1°F | Resp 20 | Ht 66.0 in | Wt 221.0 lb

## 2020-09-24 DIAGNOSIS — Z7901 Long term (current) use of anticoagulants: Secondary | ICD-10-CM

## 2020-09-24 LAB — COAGUCHEK XS/INR WAIVED
INR: 4.1 — ABNORMAL HIGH (ref 0.9–1.1)
Prothrombin Time: 49.5 s

## 2020-09-24 NOTE — Progress Notes (Signed)
Subjective:   Chief Complaint: INRV recheck   Indication: CVA Bleeding signs/symptoms: None Thromboembolic signs/symptoms: None  Missed Coumadin doses: None Medication changes: no Dietary changes: no Bacterial/viral infection: no Other concerns: no  The following portions of the patient's history were reviewed and updated as appropriate: allergies, current medications, past family history, past medical history, past social history, past surgical history and problem list.  Review of Systems Pertinent items noted in HPI and remainder of comprehensive ROS otherwise negative.   Objective:    INR Today: 4.1 Current dose: coumadin 7.5 mg daily except 5mg  on w and f    Assessment:    Supratherapeutic INR for goal of 2.5-3.5   Plan:    1. New dose: Hold tomorrow ( 09/25/20) then 7.5 mg daily except 5mg  on M,W and F   2. Next INR: 1 month     Mary-Margaret Hassell Done, FNP

## 2020-09-25 DIAGNOSIS — E039 Hypothyroidism, unspecified: Secondary | ICD-10-CM | POA: Diagnosis not present

## 2020-09-25 DIAGNOSIS — I491 Atrial premature depolarization: Secondary | ICD-10-CM | POA: Diagnosis not present

## 2020-09-25 DIAGNOSIS — I493 Ventricular premature depolarization: Secondary | ICD-10-CM | POA: Diagnosis not present

## 2020-09-25 DIAGNOSIS — I10 Essential (primary) hypertension: Secondary | ICD-10-CM | POA: Diagnosis not present

## 2020-09-25 DIAGNOSIS — E785 Hyperlipidemia, unspecified: Secondary | ICD-10-CM | POA: Diagnosis not present

## 2020-09-25 DIAGNOSIS — Z8673 Personal history of transient ischemic attack (TIA), and cerebral infarction without residual deficits: Secondary | ICD-10-CM | POA: Diagnosis not present

## 2020-09-25 DIAGNOSIS — I471 Supraventricular tachycardia: Secondary | ICD-10-CM | POA: Diagnosis not present

## 2020-09-25 DIAGNOSIS — I059 Rheumatic mitral valve disease, unspecified: Secondary | ICD-10-CM | POA: Diagnosis not present

## 2020-10-25 ENCOUNTER — Other Ambulatory Visit: Payer: Self-pay

## 2020-10-25 ENCOUNTER — Encounter: Payer: Self-pay | Admitting: Nurse Practitioner

## 2020-10-25 ENCOUNTER — Ambulatory Visit (INDEPENDENT_AMBULATORY_CARE_PROVIDER_SITE_OTHER): Payer: Medicare Other | Admitting: Nurse Practitioner

## 2020-10-25 VITALS — BP 111/58 | HR 68 | Temp 97.7°F | Resp 20 | Ht 66.0 in | Wt 220.0 lb

## 2020-10-25 DIAGNOSIS — M25561 Pain in right knee: Secondary | ICD-10-CM

## 2020-10-25 DIAGNOSIS — Z7901 Long term (current) use of anticoagulants: Secondary | ICD-10-CM

## 2020-10-25 DIAGNOSIS — G8929 Other chronic pain: Secondary | ICD-10-CM

## 2020-10-25 LAB — COAGUCHEK XS/INR WAIVED
INR: 3.9 — ABNORMAL HIGH (ref 0.9–1.1)
Prothrombin Time: 46.8 s

## 2020-10-25 NOTE — Progress Notes (Signed)
Subjective:   chief complaint: INR recheck   Indication: CVA Bleeding signs/symptoms: None Thromboembolic signs/symptoms: None  Missed Coumadin doses: None Medication changes: no Dietary changes: no Bacterial/viral infection: no Other concerns: no  The following portions of the patient's history were reviewed and updated as appropriate: allergies, current medications, past family history, past medical history, past social history, past surgical history and problem list.  Review of Systems Pertinent items noted in HPI and remainder of comprehensive ROS otherwise negative.   Objective:    INR Today: 3.9 Current dose: coumadin 7.5 mg daily except 5mg  on M,W and F     Assessment:    Supratherapeutic INR for goal of 2.5-3.5   Plan:    1. New dose:   Hold tomorrow ( 10/26/20) then coumadin 5mg  daily exept 7.5mg  in T, Th and Sat  2. . Next INR: 1 month    Mary-Margaret Hassell Done, FNP

## 2020-10-31 ENCOUNTER — Other Ambulatory Visit: Payer: Self-pay | Admitting: Nurse Practitioner

## 2020-10-31 DIAGNOSIS — R609 Edema, unspecified: Secondary | ICD-10-CM

## 2020-10-31 DIAGNOSIS — E782 Mixed hyperlipidemia: Secondary | ICD-10-CM

## 2020-10-31 DIAGNOSIS — I10 Essential (primary) hypertension: Secondary | ICD-10-CM

## 2020-11-11 ENCOUNTER — Other Ambulatory Visit: Payer: Self-pay | Admitting: Nurse Practitioner

## 2020-11-11 DIAGNOSIS — R42 Dizziness and giddiness: Secondary | ICD-10-CM

## 2020-11-15 ENCOUNTER — Other Ambulatory Visit: Payer: Self-pay | Admitting: Nurse Practitioner

## 2020-11-22 ENCOUNTER — Other Ambulatory Visit: Payer: Self-pay

## 2020-11-22 ENCOUNTER — Ambulatory Visit (INDEPENDENT_AMBULATORY_CARE_PROVIDER_SITE_OTHER): Payer: Medicare Other | Admitting: Nurse Practitioner

## 2020-11-22 ENCOUNTER — Encounter: Payer: Self-pay | Admitting: Nurse Practitioner

## 2020-11-22 VITALS — BP 121/61 | HR 61 | Temp 98.0°F | Resp 20 | Ht 66.0 in | Wt 223.0 lb

## 2020-11-22 DIAGNOSIS — Z7901 Long term (current) use of anticoagulants: Secondary | ICD-10-CM

## 2020-11-22 LAB — COAGUCHEK XS/INR WAIVED
INR: 5.5 (ref 0.9–1.1)
Prothrombin Time: 65.4 s

## 2020-11-22 NOTE — Progress Notes (Signed)
Subjective:   Chief Complaint: INR recheck:    Indication: CVA Bleeding signs/symptoms: None Thromboembolic signs/symptoms: None  Missed Coumadin doses: None Medication changes: no Dietary changes: no Bacterial/viral infection: no Other concerns: no  The following portions of the patient's history were reviewed and updated as appropriate: allergies, current medications, past family history, past medical history, past social history, past surgical history and problem list.  Review of Systems Pertinent items noted in HPI and remainder of comprehensive ROS otherwise negative.   Objective:    INR Today: 5.5 Current dose: coumadin 5mg  daily exept 7.5mg  in T, Th and Sat     Assessment:    Supratherapeutic INR for goal of 2.5-3.5   Plan:    1. New dose: Hold sat and sun( 11/22/20 and 11/23/20) then coumadin 5mg  daily    2. Next INR: 1 week    Mary-Margaret Hassell Done, FNP

## 2020-11-27 DIAGNOSIS — M170 Bilateral primary osteoarthritis of knee: Secondary | ICD-10-CM | POA: Diagnosis not present

## 2020-11-29 ENCOUNTER — Encounter: Payer: Self-pay | Admitting: Nurse Practitioner

## 2020-11-29 ENCOUNTER — Ambulatory Visit (INDEPENDENT_AMBULATORY_CARE_PROVIDER_SITE_OTHER): Payer: Medicare Other | Admitting: Nurse Practitioner

## 2020-11-29 ENCOUNTER — Other Ambulatory Visit: Payer: Self-pay

## 2020-11-29 VITALS — BP 123/57 | HR 70 | Temp 97.5°F | Resp 20 | Ht 66.0 in | Wt 224.0 lb

## 2020-11-29 DIAGNOSIS — R609 Edema, unspecified: Secondary | ICD-10-CM | POA: Diagnosis not present

## 2020-11-29 DIAGNOSIS — I6930 Unspecified sequelae of cerebral infarction: Secondary | ICD-10-CM | POA: Diagnosis not present

## 2020-11-29 DIAGNOSIS — Z7901 Long term (current) use of anticoagulants: Secondary | ICD-10-CM | POA: Diagnosis not present

## 2020-11-29 DIAGNOSIS — E782 Mixed hyperlipidemia: Secondary | ICD-10-CM | POA: Diagnosis not present

## 2020-11-29 DIAGNOSIS — R600 Localized edema: Secondary | ICD-10-CM

## 2020-11-29 DIAGNOSIS — N1831 Chronic kidney disease, stage 3a: Secondary | ICD-10-CM | POA: Diagnosis not present

## 2020-11-29 DIAGNOSIS — Z6836 Body mass index (BMI) 36.0-36.9, adult: Secondary | ICD-10-CM | POA: Diagnosis not present

## 2020-11-29 DIAGNOSIS — I10 Essential (primary) hypertension: Secondary | ICD-10-CM

## 2020-11-29 DIAGNOSIS — E034 Atrophy of thyroid (acquired): Secondary | ICD-10-CM | POA: Diagnosis not present

## 2020-11-29 LAB — COAGUCHEK XS/INR WAIVED
INR: 2.5 — ABNORMAL HIGH (ref 0.9–1.1)
Prothrombin Time: 30.6 s

## 2020-11-29 MED ORDER — ATORVASTATIN CALCIUM 40 MG PO TABS: 1.0000 | ORAL_TABLET | Freq: Every day | ORAL | 1 refills | Status: DC

## 2020-11-29 MED ORDER — WARFARIN SODIUM 5 MG PO TABS
ORAL_TABLET | ORAL | 1 refills | Status: DC
Start: 2020-11-29 — End: 2021-09-04

## 2020-11-29 MED ORDER — LEVOTHYROXINE SODIUM 50 MCG PO TABS: 50.0000 ug | ORAL_TABLET | Freq: Every day | ORAL | 11 refills | Status: DC

## 2020-11-29 MED ORDER — METOPROLOL SUCCINATE ER 25 MG PO TB24: 12.5000 mg | ORAL_TABLET | Freq: Every day | ORAL | 1 refills | Status: DC

## 2020-11-29 MED ORDER — FUROSEMIDE 40 MG PO TABS: 40.0000 mg | ORAL_TABLET | Freq: Every day | ORAL | 1 refills | Status: DC

## 2020-11-29 MED ORDER — LOSARTAN POTASSIUM 25 MG PO TABS
25.0000 mg | ORAL_TABLET | Freq: Every day | ORAL | 1 refills | Status: DC
Start: 2020-11-29 — End: 2021-08-16

## 2020-11-29 NOTE — Patient Instructions (Signed)
Peripheral Edema Peripheral edema is swelling that is caused by a buildup of fluid. Peripheral edema most often affects the lower legs, ankles, and feet. It can also develop in the arms, hands, and face. The area of the body that has peripheral edema will look swollen. It may also feel heavy or warm. Your clothes may start to feel tight. Pressing on the area may make a temporary dent in your skin. You may not be able to move your swollen arm or leg as much as usual. There are many causes of peripheral edema. It can happen because of a complication of other conditions such as congestive heart failure, kidney disease, or a problem with your blood circulation. It also can be a side effect of certain medicines or because of an infection. It often happens to women during pregnancy. Sometimes, the cause is not known. Follow these instructions at home: Managing pain, stiffness, and swelling  Raise (elevate) your legs while you are sitting or lying down. Move around often to prevent stiffness and to lessen swelling. Do not sit or stand for long periods of time. Wear support stockings as told by your health care provider. Medicines Take over-the-counter and prescription medicines only as told by your health care provider. Your health care provider may prescribe medicine to help your body get rid of excess water (diuretic). General instructions Pay attention to any changes in your symptoms. Follow instructions from your health care provider about limiting salt (sodium) in your diet. Sometimes, eating less salt may reduce swelling. Moisturize skin daily to help prevent skin from cracking and draining. Keep all follow-up visits as told by your health care provider. This is important. Contact a health care provider if you have: A fever. Edema that starts suddenly or is getting worse, especially if you are pregnant or have a medical condition. Swelling in only one leg. Increased swelling, redness, or pain in  one or both of your legs. Drainage or sores at the area where you have edema. Get help right away if you: Develop shortness of breath, especially when you are lying down. Have pain in your chest or abdomen. Feel weak. Feel faint. Summary Peripheral edema is swelling that is caused by a buildup of fluid. Peripheral edema most often affects the lower legs, ankles, and feet. Move around often to prevent stiffness and to lessen swelling. Do not sit or stand for long periods of time. Pay attention to any changes in your symptoms. Contact a health care provider if you have edema that starts suddenly or is getting worse, especially if you are pregnant or have a medical condition. Get help right away if you develop shortness of breath, especially when lying down. This information is not intended to replace advice given to you by your health care provider. Make sure you discuss any questions you have with your health care provider. Document Revised: 03/02/2018 Document Reviewed: 03/02/2018 Elsevier Patient Education  2022 [employer] Inc[employer]  

## 2020-11-29 NOTE — Progress Notes (Signed)
Subjective:    Patient ID: Kimberly Walsh, female    DOB: 09/14/1945, 75 y.o.   MRN: [id number]   Chief Complaint: medical management of chronic issues     HPI:  1. Essential hypertension, benign No c/o chest pain, sob or headache. Does not check blood pressure at home BP Readings from Last 3 Encounters:  11/22/20 121/61  10/25/20 (!) 111/58  09/24/20 110/66    2. Mixed hyperlipidemia Does not watch diet very closely and does no dedicated exercise. Lab Results  Component Value Date   CHOL 116 11/16/2019   HDL 41 11/16/2019   LDLCALC 56 11/16/2019   TRIG 99 11/16/2019   CHOLHDL 2.8 11/16/2019     3. Hypothyroidism due to acquired atrophy of thyroid No problems that she is aware of. Lab Results  Component Value Date   TSH 2.370 11/16/2019     4. Stage 3a chronic kidney disease ([facility]) No problems voiding Lab Results  Component Value Date   CREATININE 1.34 (H) 11/16/2019     5. Peripheral edema Is on lasix but has lower ext edema daily by the end of the day.  6. Late effect of cerebrovascular accident (CVA) No residual effects. Is on coumadin daily.  Indication: CVA Bleeding signs/symptoms: None Thromboembolic signs/symptoms: None  Missed Coumadin doses: None Medication changes: no Dietary changes: no Bacterial/viral infection: no Other concerns: no     7. BMI 37.0-37.9, adult No recent weight changes Wt Readings from Last 3 Encounters:  11/29/20 224 lb (101.6 kg)  11/22/20 223 lb (101.2 kg)  10/25/20 220 lb (99.8 kg)   BMI Readings from Last 3 Encounters:  11/29/20 36.15 kg/m  11/22/20 35.99 kg/m  10/25/20 35.51 kg/m       Outpatient Encounter Medications as of 11/29/2020  Medication Sig   aspirin EC 81 MG tablet Take 81 mg by mouth daily.   atorvastatin (LIPITOR) 40 MG tablet Take 1 tablet by mouth once daily   furosemide (LASIX) 40 MG tablet Take 1 tablet by mouth once daily   levothyroxine (SYNTHROID) 50 MCG tablet Take 1  tablet (50 mcg total) by mouth daily.   losartan (COZAAR) 25 MG tablet Take 1 tablet by mouth once daily   meclizine (ANTIVERT) 25 MG tablet TAKE 1 TABLET BY MOUTH EVERY 6 HOURS AS NEEDED FOR VERTIGO   metoprolol succinate (TOPROL-XL) 25 MG 24 hr tablet Take 12.5 mg by mouth daily.   warfarin (COUMADIN) 5 MG tablet 1-2 tablets as directed daily   No facility-administered encounter medications on file as of 11/29/2020.    Past Surgical History:  Procedure Laterality Date   basal carcinoma rt leg     CHOLECYSTECTOMY     FINGER SURGERY Right    Ring finger   MIDDLE EAR SURGERY     Fungus   TUBAL LIGATION     VEIN SURGERY      Family History  Problem Relation Age of Onset   COPD Father     New complaints: none  Social history: Lives by herself  Controlled substance contract: n/a     Review of Systems  Constitutional:  Negative for diaphoresis.  Eyes:  Negative for pain.  Respiratory:  Negative for shortness of breath.   Cardiovascular:  Positive for leg swelling. Negative for chest pain and palpitations.  Gastrointestinal:  Negative for abdominal pain.  Endocrine: Negative for polydipsia.  Skin:  Negative for rash.  Neurological:  Negative for dizziness, weakness and headaches.  Hematological:  Does not  bruise/bleed easily.  All other systems reviewed and are negative.     Objective:   Physical Exam Vitals and nursing note reviewed.  Constitutional:      General: She is not in acute distress.    Appearance: Normal appearance. She is well-developed.  HENT:     Head: Normocephalic.     Right Ear: Tympanic membrane normal.     Left Ear: Tympanic membrane normal.     Nose: Nose normal.     Mouth/Throat:     Mouth: Mucous membranes are moist.  Eyes:     Pupils: Pupils are equal, round, and reactive to light.  Neck:     Vascular: No carotid bruit or JVD.  Cardiovascular:     Rate and Rhythm: Normal rate and regular rhythm.     Heart sounds: Normal heart  sounds.  Pulmonary:     Effort: Pulmonary effort is normal. No respiratory distress.     Breath sounds: Normal breath sounds. No wheezing or rales.  Chest:     Chest wall: No tenderness.  Abdominal:     General: Bowel sounds are normal. There is no distension or abdominal bruit.     Palpations: Abdomen is soft. There is no hepatomegaly, splenomegaly, mass or pulsatile mass.     Tenderness: There is no abdominal tenderness.  Musculoskeletal:        General: Normal range of motion.     Cervical back: Normal range of motion and neck supple.     Right lower leg: Edema (2+) present.     Left lower leg: Edema (1+) present.  Lymphadenopathy:     Cervical: No cervical adenopathy.  Skin:    General: Skin is warm and dry.  Neurological:     Mental Status: She is alert and oriented to person, place, and time.     Deep Tendon Reflexes: Reflexes are normal and symmetric.  Psychiatric:        Behavior: Behavior normal.        Thought Content: Thought content normal.        Judgment: Judgment normal.    BP (!) 123/57   Pulse 70   Temp (!) 97.5 F (36.4 C) (Temporal)   Resp 20   Ht _0  (1.676 m)   Wt 224 lb (101.6 kg)   SpO2 99%   BMI 36.15 kg/m        Assessment & Plan:  Kimberly Walsh comes in today with chief complaint of Medical Management of Chronic Issues   Diagnosis and orders addressed:  1. Essential hypertension, benign Low sodium diet - losartan (COZAAR) 25 MG tablet; Take 1 tablet (25 mg total) by mouth daily.  Dispense: 90 tablet; Refill: 1 - metoprolol succinate (TOPROL-XL) 25 MG 24 hr tablet; Take 0.5 tablets (12.5 mg total) by mouth daily.  Dispense: 90 tablet; Refill: 1 - CBC with Differential/Platelet - CMP14+EGFR  2. Mixed hyperlipidemia Low fat diet - atorvastatin (LIPITOR) 40 MG tablet; Take 1 tablet (40 mg total) by mouth daily.  Dispense: 90 tablet; Refill: 1 - Lipid panel  3. Hypothyroidism due to acquired atrophy of thyroid Labs pending -  levothyroxine (SYNTHROID) 50 MCG tablet; Take 1 tablet (50 mcg total) by mouth daily.  Dispense: 30 tablet; Refill: 11 - Thyroid Panel With TSH  4. Stage 3a chronic kidney disease ([facility]) Labs oending  5. Peripheral edema Elevate legs when sitting - furosemide (LASIX) 40 MG tablet; Take 1 tablet (40 mg total) by mouth daily.  Dispense:  90 tablet; Refill: 1  6. Late effect of cerebrovascular accident (CVA) - warfarin (COUMADIN) 5 MG tablet; 1-2 tablets as directed daily  Dispense: 180 tablet; Refill: 1  7. BMI 36.0-36.9,adult Discussed diet and exercise for person with BMI >25 Will recheck weight in 3-6 months  8. Current use of anticoagulant therapy - CoaguChek XS/INR Waived   Labs pending Health Maintenance reviewed Diet and exercise encouraged  Follow up plan: 1 month   [place], FNP

## 2020-11-30 LAB — CMP14+EGFR
ALT: 24 IU/L (ref 0–32)
AST: 21 IU/L (ref 0–40)
Albumin/Globulin Ratio: 1.1 — ABNORMAL LOW (ref 1.2–2.2)
Albumin: 3.5 g/dL — ABNORMAL LOW (ref 3.7–4.7)
Alkaline Phosphatase: 118 IU/L (ref 44–121)
BUN/Creatinine Ratio: 20 (ref 12–28)
BUN: 27 mg/dL (ref 8–27)
Bilirubin Total: 0.4 mg/dL (ref 0.0–1.2)
CO2: 24 mmol/L (ref 20–29)
Calcium: 9.2 mg/dL (ref 8.7–10.3)
Chloride: 106 mmol/L (ref 96–106)
Creatinine, Ser: 1.32 mg/dL — ABNORMAL HIGH (ref 0.57–1.00)
Globulin, Total: 3.3 g/dL (ref 1.5–4.5)
Glucose: 117 mg/dL — ABNORMAL HIGH (ref 65–99)
Potassium: 3.9 mmol/L (ref 3.5–5.2)
Sodium: 144 mmol/L (ref 134–144)
Total Protein: 6.8 g/dL (ref 6.0–8.5)
eGFR: 43 mL/min/{1.73_m2} — ABNORMAL LOW (ref >59)

## 2020-11-30 LAB — CBC WITH DIFFERENTIAL/PLATELET
Basophils Absolute: 0 10*3/uL (ref 0.0–0.2)
Basos: 0 %
EOS (ABSOLUTE): 0 10*3/uL (ref 0.0–0.4)
Eos: 0 %
Hematocrit: 39.8 % (ref 34.0–46.6)
Hemoglobin: 13.3 g/dL (ref 11.1–15.9)
Immature Grans (Abs): 0 10*3/uL (ref 0.0–0.1)
Immature Granulocytes: 0 %
Lymphocytes Absolute: 2.8 10*3/uL (ref 0.7–3.1)
Lymphs: 23 %
MCH: 28.9 pg (ref 26.6–33.0)
MCHC: 33.4 g/dL (ref 31.5–35.7)
MCV: 87 fL (ref 79–97)
Monocytes Absolute: 0.5 10*3/uL (ref 0.1–0.9)
Monocytes: 4 %
Neutrophils Absolute: 8.7 10*3/uL — ABNORMAL HIGH (ref 1.4–7.0)
Neutrophils: 73 %
Platelets: 198 10*3/uL (ref 150–450)
RBC: 4.6 x10E6/uL (ref 3.77–5.28)
RDW: 14.5 % (ref 11.7–15.4)
WBC: 12 10*3/uL — ABNORMAL HIGH (ref 3.4–10.8)

## 2020-11-30 LAB — LIPID PANEL
Chol/HDL Ratio: 2.5 ratio (ref 0.0–4.4)
Cholesterol, Total: 116 mg/dL (ref 100–199)
HDL: 46 mg/dL (ref >39)
LDL Chol Calc (NIH): 48 mg/dL (ref 0–99)
Triglycerides: 123 mg/dL (ref 0–149)
VLDL Cholesterol Cal: 22 mg/dL (ref 5–40)

## 2020-11-30 LAB — THYROID PANEL WITH TSH
Free Thyroxine Index: 1.7 (ref 1.2–4.9)
T3 Uptake Ratio: 28 % (ref 24–39)
T4, Total: 6.2 ug/dL (ref 4.5–12.0)
TSH: 2.72 u[IU]/mL (ref 0.450–4.500)

## 2020-12-03 ENCOUNTER — Ambulatory Visit: Payer: Self-pay | Admitting: Nurse Practitioner

## 2020-12-30 ENCOUNTER — Other Ambulatory Visit: Payer: Self-pay

## 2020-12-30 ENCOUNTER — Encounter: Payer: Self-pay | Admitting: Nurse Practitioner

## 2020-12-30 ENCOUNTER — Ambulatory Visit (INDEPENDENT_AMBULATORY_CARE_PROVIDER_SITE_OTHER): Payer: Medicare Other | Admitting: Nurse Practitioner

## 2020-12-30 VITALS — BP 110/64 | HR 80 | Temp 98.0°F | Resp 20 | Ht 66.0 in | Wt 223.0 lb

## 2020-12-30 DIAGNOSIS — Z7901 Long term (current) use of anticoagulants: Secondary | ICD-10-CM | POA: Diagnosis not present

## 2020-12-30 DIAGNOSIS — D0472 Carcinoma in situ of skin of left lower limb, including hip: Secondary | ICD-10-CM | POA: Diagnosis not present

## 2020-12-30 DIAGNOSIS — D485 Neoplasm of uncertain behavior of skin: Secondary | ICD-10-CM | POA: Diagnosis not present

## 2020-12-30 DIAGNOSIS — I781 Nevus, non-neoplastic: Secondary | ICD-10-CM | POA: Diagnosis not present

## 2020-12-30 DIAGNOSIS — Z85828 Personal history of other malignant neoplasm of skin: Secondary | ICD-10-CM | POA: Diagnosis not present

## 2020-12-30 DIAGNOSIS — L570 Actinic keratosis: Secondary | ICD-10-CM | POA: Diagnosis not present

## 2020-12-30 DIAGNOSIS — L821 Other seborrheic keratosis: Secondary | ICD-10-CM | POA: Diagnosis not present

## 2020-12-30 DIAGNOSIS — L814 Other melanin hyperpigmentation: Secondary | ICD-10-CM | POA: Diagnosis not present

## 2020-12-30 LAB — COAGUCHEK XS/INR WAIVED
INR: 2.6 — ABNORMAL HIGH (ref 0.9–1.1)
Prothrombin Time: 31.2 s

## 2020-12-30 NOTE — Progress Notes (Signed)
Subjective:   Chief Complaint: INR recheck   Indication: CVA Bleeding signs/symptoms: None Thromboembolic signs/symptoms: None  Missed Coumadin doses: None Medication changes: no Dietary changes: no Bacterial/viral infection: no Other concerns: no  The following portions of the patient's history were reviewed and updated as appropriate: allergies, current medications, past family history, past medical history, past social history, past surgical history, and problem list.  Review of Systems Pertinent items noted in HPI and remainder of comprehensive ROS otherwise negative.   Objective:    INR Today: 2.6 Current dose: coumadin 5mg  daily   Assessment:    Therapeutic INR for goal of 2-3   Plan:    1. New dose: no change   2. Next INR: 1 month  Mary-Margaret Hassell Done, FNP

## 2021-01-02 ENCOUNTER — Other Ambulatory Visit: Payer: Self-pay

## 2021-01-02 DIAGNOSIS — I872 Venous insufficiency (chronic) (peripheral): Secondary | ICD-10-CM

## 2021-01-09 ENCOUNTER — Other Ambulatory Visit: Payer: Self-pay

## 2021-01-09 ENCOUNTER — Encounter: Payer: Self-pay | Admitting: Vascular Surgery

## 2021-01-09 ENCOUNTER — Ambulatory Visit (HOSPITAL_COMMUNITY)
Admission: RE | Admit: 2021-01-09 | Discharge: 2021-01-09 | Disposition: A | Discharge status: Home / Self Care | Payer: Medicare Other | Source: Ambulatory Visit | Attending: Vascular Surgery | Admitting: Vascular Surgery

## 2021-01-09 ENCOUNTER — Ambulatory Visit (INDEPENDENT_AMBULATORY_CARE_PROVIDER_SITE_OTHER)
Admission: RE | Admit: 2021-01-09 | Discharge: 2021-01-09 | Disposition: A | Discharge status: Home / Self Care | Payer: Medicare Other | Source: Ambulatory Visit | Attending: Vascular Surgery | Admitting: Vascular Surgery

## 2021-01-09 ENCOUNTER — Ambulatory Visit (INDEPENDENT_AMBULATORY_CARE_PROVIDER_SITE_OTHER): Payer: Medicare Other | Admitting: Vascular Surgery

## 2021-01-09 VITALS — BP 125/77 | HR 64 | Temp 98.3°F | Resp 20 | Ht 66.0 in | Wt 223.0 lb

## 2021-01-09 DIAGNOSIS — I83811 Varicose veins of right lower extremities with pain: Secondary | ICD-10-CM

## 2021-01-09 DIAGNOSIS — I872 Venous insufficiency (chronic) (peripheral): Secondary | ICD-10-CM

## 2021-01-09 NOTE — Progress Notes (Signed)
Referring Physician: Gerrit Halls PA  Patient name: Kimberly Walsh MRN: [id number] DOB: 09-Oct-1945 Sex: female  REASON FOR CONSULT: Right leg swelling  HPI: Kimberly Walsh is a 75 y.o. 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  She was sent here for evaluation for healing potential from a vascular standpoint.  The patient has had previous ablation of her right greater saphenous vein and some sclerotherapy.  She states her symptoms improved a little bit from this but the right leg is always remained chronically swollen.  She does wear some compression stockings on the right leg intermittently.  She has no prior history of DVT.  She does not describe claudication symptoms.  She does not really have any significant problems in the left leg.    Past Medical History:  Diagnosis Date   Hyperlipidemia    Venous stasis    Vertigo    Past Surgical History:  Procedure Laterality Date   basal carcinoma rt leg     CHOLECYSTECTOMY     FINGER SURGERY Right    Ring finger   MIDDLE EAR SURGERY     Fungus   TUBAL LIGATION     VEIN SURGERY      Family History  Problem Relation Age of Onset   COPD Father     SOCIAL HISTORY: Social History   Socioeconomic History   Marital status: Widowed    Spouse name: Not on file   Number of children: 3   Years of education: 12   Highest education level: High school graduate  Occupational History    Employer: [employer]    Comment: part time  Tobacco Use   Smoking status: Never   Smokeless tobacco: Never  Vaping Use   Vaping Use: Never used  Substance and Sexual Activity   Alcohol use: No   Drug use: No   Sexual activity: Not Currently  Other Topics Concern   Not on file  Social History Narrative   Not on file   Social Determinants of Health   Financial Resource Strain: Not on file  Food Insecurity: Not on file  Transportation Needs: Not on file  Physical Activity: Not on file   Stress: Not on file  Social Connections: Not on file  Intimate Partner Violence: Not on file    Allergies  Allergen Reactions   Ace Inhibitors Cough    Current Outpatient Medications  Medication Sig Dispense Refill   aspirin EC 81 MG tablet Take 81 mg by mouth daily.     atorvastatin (LIPITOR) 40 MG tablet Take 1 tablet (40 mg total) by mouth daily. 90 tablet 1   furosemide (LASIX) 40 MG tablet Take 1 tablet (40 mg total) by mouth daily. 90 tablet 1   levothyroxine (SYNTHROID) 50 MCG tablet Take 1 tablet (50 mcg total) by mouth daily. 30 tablet 11   losartan (COZAAR) 25 MG tablet Take 1 tablet (25 mg total) by mouth daily. 90 tablet 1   meclizine (ANTIVERT) 25 MG tablet TAKE 1 TABLET BY MOUTH EVERY 6 HOURS AS NEEDED FOR VERTIGO 30 tablet 0   metoprolol succinate (TOPROL-XL) 25 MG 24 hr tablet Take 0.5 tablets (12.5 mg total) by mouth daily. 90 tablet 1   warfarin (COUMADIN) 5 MG tablet 1-2 tablets as directed daily 180 tablet 1   No current facility-administered medications for this visit.    ROS:   General:  No weight loss, Fever, chills  HEENT:  No recent headaches, no nasal bleeding, no visual changes, no sore throat  Neurologic: No dizziness, blackouts, seizures. No recent symptoms of stroke or mini- stroke. No recent episodes of slurred speech, or temporary blindness.  Cardiac: No recent episodes of chest pain/pressure, no shortness of breath at rest.  No shortness of breath with exertion.  Denies history of atrial fibrillation or irregular heartbeat  Vascular: No history of rest pain in feet.  No history of claudication.  No history of non-healing ulcer, No history of DVT   Pulmonary: No home oxygen, no productive cough, no hemoptysis,  No asthma or wheezing  Musculoskeletal:  [X]  Arthritis, [ ]  Low back pain,  [X]  Joint pain  Hematologic:No history of hypercoagulable state.  No history of easy bleeding.  No history of anemia  Gastrointestinal: No hematochezia or  melena,  No gastroesophageal reflux, no trouble swallowing  Urinary: [ ]  chronic Kidney disease, [ ]  on HD - [ ]  MWF or [ ]  TTHS, [ ]  Burning with urination, [ ]  Frequent urination, [ ]  Difficulty urinating;   Skin: No rashes  Psychological: No history of anxiety,  No history of depression   Physical Examination  Vitals:   01/09/21 1325  BP: 125/77  Pulse: 64  Resp: 20  Temp: 98.3 F (36.8 C)  SpO2: 99%  Weight: 223 lb (101.2 kg)  Height: 5\' 6"  (1.676 m)    Body mass index is 35.99 kg/m.  General:  Alert and oriented, no acute distress HEENT: Normal Neck: No JVD Cardiac: Regular Rate and Rhythm Skin: No rash, mild circumferential erythema and hemosiderin staining gaiter area right leg Extremity Pulses:  2+ radial, brachial, femoral, dorsalis pedis, posterior tibial pulses bilaterally Musculoskeletal: No deformity 1+ edema extending from the right pretibial region into the ankle and foot on the right side  Neurologic: Upper and lower extremity motor 5/5 and symmetric  DATA:  Patient had bilateral ABIs performed today which I reviewed and interpreted.  These are greater than 1 triphasic bilaterally.  She also had a venous duplex scan which showed evidence that the right greater saphenous vein had been removed.  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  No significant arterial occlusive disease.   PLAN: Patient has already had previous removal of her saphenous vein.  No vein intervention at this point.  I discussed with her that she has deep vein reflux which does not really have a intervention to improve other than compression stockings.  I did reassure her she has normal arterial circulation.  From a vascular standpoint she should be able to have a total knee replacement.  She will follow-up with me on as-needed basis.   Ruta Hinds, MD [facility] Office: [phone]

## 2021-02-03 ENCOUNTER — Encounter: Payer: Self-pay | Admitting: Nurse Practitioner

## 2021-02-03 ENCOUNTER — Other Ambulatory Visit: Payer: Self-pay

## 2021-02-03 ENCOUNTER — Ambulatory Visit (INDEPENDENT_AMBULATORY_CARE_PROVIDER_SITE_OTHER): Payer: Medicare Other | Admitting: Nurse Practitioner

## 2021-02-03 VITALS — BP 132/74 | HR 93 | Temp 97.8°F | Resp 20 | Ht 66.0 in | Wt 224.2 lb

## 2021-02-03 DIAGNOSIS — Z7901 Long term (current) use of anticoagulants: Secondary | ICD-10-CM | POA: Diagnosis not present

## 2021-02-03 LAB — COAGUCHEK XS/INR WAIVED
INR: 1.7 — ABNORMAL HIGH (ref 0.9–1.1)
Prothrombin Time: 20.8 s

## 2021-02-03 NOTE — Progress Notes (Signed)
Subjective:   Chief Complaint: INR recheck   Indication: CVA Bleeding signs/symptoms: None Thromboembolic signs/symptoms: None  Missed Coumadin doses: None Medication changes: no Dietary changes: no Bacterial/viral infection: no Other concerns: no  The following portions of the patient's history were reviewed and updated as appropriate: allergies, current medications, past family history, past medical history, past social history, past surgical history, and problem list.  Review of Systems Pertinent items noted in HPI and remainder of comprehensive ROS otherwise negative.   Objective:    INR Today: 1.7 Current dose: coumadin '5mg'$  daily   Assessment:    Subtherapeutic INR for goal of 2.5-3.5   Plan:    1. New dose: Coumadin '10mg'$  tomorrow the back to '5mg'$  daily   2. Next INR: 2 weeks  Mary-Margaret Hassell Done, FNP

## 2021-02-06 DIAGNOSIS — M170 Bilateral primary osteoarthritis of knee: Secondary | ICD-10-CM | POA: Diagnosis not present

## 2021-02-12 DIAGNOSIS — M170 Bilateral primary osteoarthritis of knee: Secondary | ICD-10-CM | POA: Diagnosis not present

## 2021-02-14 DIAGNOSIS — I83891 Varicose veins of right lower extremities with other complications: Secondary | ICD-10-CM | POA: Diagnosis not present

## 2021-02-14 DIAGNOSIS — I83892 Varicose veins of left lower extremities with other complications: Secondary | ICD-10-CM | POA: Diagnosis not present

## 2021-02-14 DIAGNOSIS — I872 Venous insufficiency (chronic) (peripheral): Secondary | ICD-10-CM | POA: Diagnosis not present

## 2021-02-17 ENCOUNTER — Other Ambulatory Visit: Payer: Self-pay

## 2021-02-17 ENCOUNTER — Ambulatory Visit (INDEPENDENT_AMBULATORY_CARE_PROVIDER_SITE_OTHER): Payer: Medicare Other | Admitting: Nurse Practitioner

## 2021-02-17 ENCOUNTER — Encounter: Payer: Self-pay | Admitting: Nurse Practitioner

## 2021-02-17 VITALS — BP 109/62 | HR 71 | Temp 97.7°F | Resp 20 | Ht 66.0 in | Wt 222.0 lb

## 2021-02-17 DIAGNOSIS — Z7901 Long term (current) use of anticoagulants: Secondary | ICD-10-CM | POA: Diagnosis not present

## 2021-02-17 DIAGNOSIS — I6930 Unspecified sequelae of cerebral infarction: Secondary | ICD-10-CM | POA: Diagnosis not present

## 2021-02-17 LAB — COAGUCHEK XS/INR WAIVED
INR: 3 — ABNORMAL HIGH (ref 0.9–1.1)
Prothrombin Time: 36 s

## 2021-02-17 NOTE — Progress Notes (Signed)
Subjective:   Chief Complaints: INR recheck   Indication: CVA Bleeding signs/symptoms: None Thromboembolic signs/symptoms: None  Missed Coumadin doses: None Medication changes: no Dietary changes: no Bacterial/viral infection: no Other concerns: no  The following portions of the patient's history were reviewed and updated as appropriate: allergies, current medications, past family history, past medical history, past social history, past surgical history, and problem list.  Review of Systems Pertinent items noted in HPI and remainder of comprehensive ROS otherwise negative.   Objective:    INR Today: 3.0 Current dose: coumadin '5mg'$  daily   Assessment:    Therapeutic INR for goal of 2.5-3.5   Plan:    1. New dose: no change   2. Next INR: 1 month   Mary-Margaret Hassell Done, FNP

## 2021-02-19 DIAGNOSIS — M170 Bilateral primary osteoarthritis of knee: Secondary | ICD-10-CM | POA: Diagnosis not present

## 2021-02-26 ENCOUNTER — Other Ambulatory Visit: Payer: Self-pay | Admitting: Nurse Practitioner

## 2021-02-26 ENCOUNTER — Other Ambulatory Visit: Payer: Self-pay | Admitting: Family Medicine

## 2021-02-26 DIAGNOSIS — R42 Dizziness and giddiness: Secondary | ICD-10-CM

## 2021-03-03 ENCOUNTER — Other Ambulatory Visit: Payer: Self-pay | Admitting: Nurse Practitioner

## 2021-03-04 ENCOUNTER — Other Ambulatory Visit: Payer: Self-pay | Admitting: Nurse Practitioner

## 2021-03-17 ENCOUNTER — Other Ambulatory Visit: Payer: Self-pay

## 2021-03-17 ENCOUNTER — Encounter: Payer: Self-pay | Admitting: Nurse Practitioner

## 2021-03-17 ENCOUNTER — Ambulatory Visit (INDEPENDENT_AMBULATORY_CARE_PROVIDER_SITE_OTHER): Payer: Medicare Other | Admitting: Nurse Practitioner

## 2021-03-17 VITALS — BP 124/73 | HR 61 | Temp 98.0°F | Ht 66.0 in | Wt 222.0 lb

## 2021-03-17 DIAGNOSIS — Z7901 Long term (current) use of anticoagulants: Secondary | ICD-10-CM | POA: Diagnosis not present

## 2021-03-17 DIAGNOSIS — R519 Headache, unspecified: Secondary | ICD-10-CM

## 2021-03-17 LAB — COAGUCHEK XS/INR WAIVED
INR: 2.4 — ABNORMAL HIGH (ref 0.9–1.1)
Prothrombin Time: 28.8 s

## 2021-03-17 MED ORDER — TRAMADOL HCL 50 MG PO TABS: 50.0000 mg | ORAL_TABLET | Freq: Three times a day (TID) | ORAL | 0 refills | Status: AC | PRN

## 2021-03-17 NOTE — Progress Notes (Signed)
Subjective:   Chief complaint: INR recheck   Indication: CVA Bleeding signs/symptoms: None Thromboembolic signs/symptoms: None  Missed Coumadin doses: None Medication changes: no Dietary changes: no Bacterial/viral infection: no Other concerns: no  The following portions of the patient's history were reviewed and updated as appropriate: allergies, current medications, past family history, past medical history, past social history, past surgical history, and problem list.  Patient states she has had a headache for 3 weeks. On left side up around her ear. Rates pain 8/10 today. Tylenol not helping  Review of Systems Pertinent items noted in HPI and remainder of comprehensive ROS otherwise negative.   Objective:    INR Today:  2.4         Current dose: coumadin 5mg  daily   Assessment:    Therapeutic INR for goal of 2-3   Plan:    1. New dose: no change   2. Next INR: 1 month   Headache- rx ultram 50mg  1 po Q6 ( sedation precautions ) due to cannot do NSAIDS  rest. Keep follow up appointment with neurology    [place], FNP

## 2021-03-19 ENCOUNTER — Ambulatory Visit
Admission: RE | Admit: 2021-03-19 | Discharge: 2021-03-19 | Disposition: A | Discharge status: Home / Self Care | Payer: Medicare Other | Source: Ambulatory Visit | Attending: Nurse Practitioner | Admitting: Nurse Practitioner

## 2021-03-19 ENCOUNTER — Other Ambulatory Visit: Payer: Self-pay | Admitting: Nurse Practitioner

## 2021-03-19 ENCOUNTER — Other Ambulatory Visit: Payer: Self-pay

## 2021-03-19 DIAGNOSIS — Z1231 Encounter for screening mammogram for malignant neoplasm of breast: Secondary | ICD-10-CM

## 2021-03-29 DIAGNOSIS — Z23 Encounter for immunization: Secondary | ICD-10-CM | POA: Diagnosis not present

## 2021-04-02 DIAGNOSIS — E785 Hyperlipidemia, unspecified: Secondary | ICD-10-CM | POA: Diagnosis not present

## 2021-04-02 DIAGNOSIS — E782 Mixed hyperlipidemia: Secondary | ICD-10-CM | POA: Diagnosis not present

## 2021-04-02 DIAGNOSIS — I639 Cerebral infarction, unspecified: Secondary | ICD-10-CM | POA: Diagnosis not present

## 2021-04-02 DIAGNOSIS — I491 Atrial premature depolarization: Secondary | ICD-10-CM | POA: Diagnosis not present

## 2021-04-02 DIAGNOSIS — Z8673 Personal history of transient ischemic attack (TIA), and cerebral infarction without residual deficits: Secondary | ICD-10-CM | POA: Diagnosis not present

## 2021-04-02 DIAGNOSIS — I10 Essential (primary) hypertension: Secondary | ICD-10-CM | POA: Diagnosis not present

## 2021-04-02 DIAGNOSIS — I3481 Nonrheumatic mitral (valve) annulus calcification: Secondary | ICD-10-CM | POA: Diagnosis not present

## 2021-04-02 DIAGNOSIS — R799 Abnormal finding of blood chemistry, unspecified: Secondary | ICD-10-CM | POA: Diagnosis not present

## 2021-04-02 DIAGNOSIS — I471 Supraventricular tachycardia: Secondary | ICD-10-CM | POA: Diagnosis not present

## 2021-04-03 DIAGNOSIS — M170 Bilateral primary osteoarthritis of knee: Secondary | ICD-10-CM | POA: Diagnosis not present

## 2021-04-14 ENCOUNTER — Ambulatory Visit (INDEPENDENT_AMBULATORY_CARE_PROVIDER_SITE_OTHER): Payer: Medicare Other | Admitting: Nurse Practitioner

## 2021-04-14 ENCOUNTER — Other Ambulatory Visit: Payer: Self-pay

## 2021-04-14 ENCOUNTER — Encounter: Payer: Self-pay | Admitting: Nurse Practitioner

## 2021-04-14 VITALS — BP 131/67 | HR 70 | Temp 97.9°F | Resp 20 | Ht 66.0 in | Wt 222.0 lb

## 2021-04-14 DIAGNOSIS — R109 Unspecified abdominal pain: Secondary | ICD-10-CM | POA: Diagnosis not present

## 2021-04-14 DIAGNOSIS — Z7901 Long term (current) use of anticoagulants: Secondary | ICD-10-CM | POA: Diagnosis not present

## 2021-04-14 LAB — COAGUCHEK XS/INR WAIVED
INR: 2.6 — ABNORMAL HIGH (ref 0.9–1.1)
Prothrombin Time: 30.6 s

## 2021-04-14 MED ORDER — CYCLOBENZAPRINE HCL 10 MG PO TABS: 10.0000 mg | ORAL_TABLET | Freq: Three times a day (TID) | ORAL | 1 refills | Status: DC | PRN

## 2021-04-14 NOTE — Addendum Note (Signed)
Addended by: Chevis Pretty on: 04/14/2021 03:36 PM   Modules accepted: Orders

## 2021-04-14 NOTE — Progress Notes (Addendum)
Subjective:   Chief Complaint: INR recheck   Indication: CVA Bleeding signs/symptoms: None Thromboembolic signs/symptoms: None  Missed Coumadin doses: None Medication changes: no Dietary changes: no Bacterial/viral infection: no Other concerns: no  The following portions of the patient's history were reviewed and updated as appropriate: allergies, current medications, past family history, past medical history, past social history, past surgical history, and problem list.  Review of Systems Right upper flank pain. Only hurts when she is moving. Rate [place] /10. If she sits still t eases the pain.   Objective:  Physical Exam Vitals reviewed.  Constitutional:      Appearance: Normal appearance.  Cardiovascular:     Rate and Rhythm: Regular rhythm.     Heart sounds: Normal heart sounds.  Pulmonary:     Effort: Pulmonary effort is normal.     Breath sounds: Normal breath sounds.  Musculoskeletal:     Comments: No pain on palapation of right flank. FROM with pain on rotation  Skin:    General: Skin is warm.     Comments: nO flank rash  Neurological:     Mental Status: She is alert.     INR Today: 2.6 Current dose: coumadin 5mg  daily  Assessment:    Therapeutic INR for goal of 2-3   Plan:    1. New dose: no change   2. Next INR: 1 month   Patient saw new cardiologist a few weeks ago. They told her that she dd not have a stroke 6/202021. Said she should have never been put on coumadin. They have placed a heart monitor on her and if it does not show atrial fib then they will stop coumadin.  Flank pain Flexeril 10mg  1 po TID #30 1 refill Kimberly Walsh Done, FNP

## 2021-04-17 ENCOUNTER — Telehealth: Payer: Self-pay | Admitting: Nurse Practitioner

## 2021-04-17 NOTE — Telephone Encounter (Signed)
Left message for patient to call back and schedule Medicare Annual Wellness Visit (AWV) to be completed by video or phone.   Last AWV: 06/09/2019  Please schedule at anytime with [facility] Health Advisor.  45 minute appointment  Any questions, please contact me at [phone]

## 2021-04-21 ENCOUNTER — Ambulatory Visit (INDEPENDENT_AMBULATORY_CARE_PROVIDER_SITE_OTHER): Payer: Medicare Other

## 2021-04-21 VITALS — Ht 66.0 in | Wt 222.0 lb

## 2021-04-21 DIAGNOSIS — Z1212 Encounter for screening for malignant neoplasm of rectum: Secondary | ICD-10-CM

## 2021-04-21 DIAGNOSIS — Z0000 Encounter for general adult medical examination without abnormal findings: Secondary | ICD-10-CM

## 2021-04-21 DIAGNOSIS — Z1211 Encounter for screening for malignant neoplasm of colon: Secondary | ICD-10-CM

## 2021-04-21 NOTE — Progress Notes (Signed)
Subjective:   Kimberly Walsh is a 75 y.o. female who presents for Medicare Annual (Subsequent) preventive examination.Virtual Visit via Telephone Note  I connected with  Kimberly Walsh on 04/21/21 at 10:30 AM EDT by telephone and verified that I am speaking with the correct person using two identifiers.  Location: Patient: Home  Provider: [facility] Persons participating in the virtual visit: patient/Nurse Health Advisor   I discussed the limitations, risks, security and privacy concerns of performing an evaluation and management service by telephone and the availability of in person appointments. The patient expressed understanding and agreed to proceed.  Interactive audio and video telecommunications were attempted between this nurse and patient, however failed, due to patient having technical difficulties OR patient did not have access to video capability.  We continued and completed visit with audio only.  Some vital signs may be absent or patient reported.   Kimberly Driver, LPN  Review of Systems     Cardiac Risk Factors include: advanced age (>30mn, >>33women);hypertension;obesity (BMI >30kg/m2);sedentary lifestyle     Objective:    Today's Vitals   04/21/21 1032  Weight: 222 lb (100.7 kg)  Height: _0  (1.676 m)   Body mass index is 35.83 kg/m.  Advanced Directives 04/21/2021 06/09/2019  Does Patient Have a Medical Advance Directive? Yes No  Type of AParamedicof APablo PenaLiving will -  Copy of HTempletonin Chart? No - copy requested -  Would patient like information on creating a medical advance directive? No - Patient declined No - Patient declined    Current Medications (verified) Outpatient Encounter Medications as of 04/21/2021  Medication Sig   aspirin EC 81 MG tablet Take 81 mg by mouth daily.   atorvastatin (LIPITOR) 40 MG tablet Take 1 tablet (40 mg total) by mouth daily.   cyclobenzaprine (FLEXERIL) 10 MG  tablet Take 1 tablet (10 mg total) by mouth 3 (three) times daily as needed for muscle spasms.   fluorouracil (EFUDEX) 5 % cream fluorouracil 5 % topical cream  APPLY TO AFFECTED AREA(S) ONCE DAILY FOR 6 WEEKS   furosemide (LASIX) 40 MG tablet Take 1 tablet (40 mg total) by mouth daily.   levothyroxine (SYNTHROID) 50 MCG tablet Take 1 tablet (50 mcg total) by mouth daily.   losartan (COZAAR) 25 MG tablet Take 1 tablet (25 mg total) by mouth daily.   meclizine (ANTIVERT) 25 MG tablet TAKE 1 TABLET BY MOUTH EVERY 6 HOURS AS NEEDED FOR VERTIGO   metoprolol succinate (TOPROL-XL) 25 MG 24 hr tablet Take 0.5 tablets (12.5 mg total) by mouth daily.   traMADol (ULTRAM) 50 MG tablet tramadol 50 mg tablet  TAKE 1 TABLET BY MOUTH EVERY 8 HOURS AS NEEDED FOR 5 DAYS   warfarin (COUMADIN) 5 MG tablet 1-2 tablets as directed daily   No facility-administered encounter medications on file as of 04/21/2021.    Allergies (verified) Ace inhibitors   History: Past Medical History:  Diagnosis Date   Hyperlipidemia    Venous stasis    Vertigo    Past Surgical History:  Procedure Laterality Date   basal carcinoma rt leg     CHOLECYSTECTOMY     FINGER SURGERY Right    Ring finger   MIDDLE EAR SURGERY     Fungus   TUBAL LIGATION     VEIN SURGERY     Family History  Problem Relation Age of Onset   COPD Father    Social History  Socioeconomic History   Marital status: Widowed    Spouse name: Not on file   Number of children: 3   Years of education: 12   Highest education level: High school graduate  Occupational History    Employer: [employer]    Comment: part time  Tobacco Use   Smoking status: Never   Smokeless tobacco: Never  Vaping Use   Vaping Use: Never used  Substance and Sexual Activity   Alcohol use: No   Drug use: No   Sexual activity: Not Currently  Other Topics Concern   Not on file  Social History Narrative   Retired Science writer   3 children-2 daughters and 1 son   81  grandchildren   Widowed since 04/22/2019.   Social Determinants of Health   Financial Resource Strain: Low Risk    Difficulty of Paying Living Expenses: Not hard at all  Food Insecurity: No Food Insecurity   Worried About Charity fundraiser in the Last Year: Never true   [place] in the Last Year: Never true  Transportation Needs: No Transportation Needs   Lack of Transportation (Medical): No   Lack of Transportation (Non-Medical): No  Physical Activity: Insufficiently Active   Days of Exercise per Week: 3 days   Minutes of Exercise per Session: 20 min  Stress: No Stress Concern Present   Feeling of Stress : Not at all  Social Connections: Moderately Integrated   Frequency of Communication with Friends and Family: More than three times a week   Frequency of Social Gatherings with Friends and Family: More than three times a week   Attends Religious Services: More than 4 times per year   Active Member of [employer] or Organizations: Yes   Attends Archivist Meetings: More than 4 times per year   Marital Status: Widowed    Tobacco Counseling Counseling given: Not Answered   Clinical Intake:  Pre-visit preparation completed: Yes        BMI - recorded: 35.83 Nutritional Status: BMI > 30  Obese Nutritional Risks: None Diabetes: No  How often do you need to have someone help you when you read instructions, pamphlets, or other written materials from your doctor or pharmacy?: 1 - Never  Diabetic?NO  Interpreter Needed?: No  Information entered by :: MJ Markiyah Gahm, LPN   Activities of Daily Living In your present state of health, do you have any difficulty performing the following activities: 04/21/2021 12/30/2020  Hearing? Y N  Vision? N N  Difficulty concentrating or making decisions? Y N  Comment Remembering, at times since Covid dx in 2020. -  Walking or climbing stairs? N N  Dressing or bathing? N N  Doing errands, shopping? N N  Preparing Food and  eating ? N -  Using the Toilet? N -  In the past six months, have you accidently leaked urine? N -  Do you have problems with loss of bowel control? N -  Managing your Medications? N -  Managing your Finances? N -  Housekeeping or managing your Housekeeping? N -  Some recent data might be hidden    Patient Care Team: Chevis Pretty, FNP as PCP - General (Family Medicine) Ilean China, RN as Registered Nurse  Indicate any recent Medical Services you may have received from other than [facility] providers in the past year (date may be approximate).     Assessment:   This is a routine wellness examination for Corinda.  Hearing/Vision screen Hearing  Screening - Comments:: Some hearing issues, since Covid illness. Vision Screening - Comments:: Glasses. Dr. Krista Blue, 12/2019.  Dietary issues and exercise activities discussed: Current Exercise Habits: Home exercise routine, Type of exercise: walking, Time (Minutes): 20, Frequency (Times/Week): 3, Weekly Exercise (Minutes/Week): 60, Intensity: Mild, Exercise limited by: cardiac condition(s)   Goals Addressed             This Visit's Progress    Exercise 3x per week (30 min per time)         Depression Screen PHQ 2/9 Scores 04/21/2021 04/14/2021 03/17/2021 02/17/2021 12/30/2020 11/29/2020 11/22/2020  PHQ - 2 Score 0 0 0 0 0 0 0    Fall Risk Fall Risk  04/21/2021 04/14/2021 03/17/2021 02/17/2021 12/30/2020  Falls in the past year? 0 0 0 0 0  Number falls in past yr: 0 - - - -  Injury with Fall? 0 - - - -  Risk for fall due to : Impaired balance/gait;Impaired vision - - - -  Follow up Falls prevention discussed - - - -    FALL RISK PREVENTION PERTAINING TO THE HOME:  Any stairs in or around the home? Yes  If so, are there any without handrails? No  Home free of loose throw rugs in walkways, pet beds, electrical cords, etc? Yes  Adequate lighting in your home to reduce risk of falls? Yes   ASSISTIVE DEVICES UTILIZED TO PREVENT  FALLS:  Life alert? No  Use of a cane, walker or w/c? No  Grab bars in the bathroom? No  Shower chair or bench in shower? No  Elevated toilet seat or a handicapped toilet? Yes   TIMED UP AND GO:  Was the test performed? No . Phone visit.    Cognitive Function:     6CIT Screen 04/21/2021 06/09/2019  What Year? 0 points 0 points  What month? 0 points 0 points  What time? 0 points 0 points  Count back from 20 0 points 0 points  Months in reverse 0 points 0 points  Repeat phrase 0 points 2 points  Total Score 0 2    Immunizations Immunization History  Administered Date(s) Administered   Fluad Quad(high Dose 65+) 03/30/2020   Hepatitis B 09/25/1998, 10/31/1998, 04/03/1999   Influenza Whole 03/10/2012   Influenza, High Dose Seasonal PF 04/02/2013, 04/03/2017, 02/22/2018, 03/09/2019   Influenza,inj,Quad PF,6+ Mos 05/05/2016   Influenza-Unspecified 03/31/2014, 03/31/2015   MMR 10/22/2011   Moderna SARS-COV2 Booster Vaccination 07/01/2020   Moderna Sars-Covid-2 Vaccination 07/31/2019, 08/27/2019   Pneumococcal Conjugate-13 05/09/2013   Pneumococcal Polysaccharide-23 09/28/2016, 05/04/2019   Tdap 04/09/2011   Zoster, Live 05/23/2013    TDAP status: Up to date  Flu Vaccine status: Up to date  Pneumococcal vaccine status: Up to date  Covid-19 vaccine status: Information provided on how to obtain vaccines.   Qualifies for Shingles Vaccine? Yes   Zostavax completed Yes   Shingrix Completed?: No.    Education has been provided regarding the importance of this vaccine. Patient has been advised to call insurance company to determine out of pocket expense if they have not yet received this vaccine. Advised may also receive vaccine at local pharmacy or Health Dept. Verbalized acceptance and understanding.  Screening Tests Health Maintenance  Topic Date Due   Zoster Vaccines- Shingrix (1 of 2) Never done   Fecal DNA (Cologuard)  Never done   PAP SMEAR-Modifier  05/09/2014    COVID-19 Vaccine (3 - Moderna risk series) 07/29/2020   INFLUENZA VACCINE  01/20/2021  TETANUS/TDAP  04/08/2021   DEXA SCAN  07/28/2022   MAMMOGRAM  03/20/2023   Pneumonia Vaccine 72+ Years old  Completed   Hepatitis C Screening  Completed   HPV VACCINES  Aged Out    Health Maintenance  Health Maintenance Due  Topic Date Due   Zoster Vaccines- Shingrix (1 of 2) Never done   Fecal DNA (Cologuard)  Never done   PAP SMEAR-Modifier  05/09/2014   COVID-19 Vaccine (3 - Moderna risk series) 07/29/2020   INFLUENZA VACCINE  01/20/2021   TETANUS/TDAP  04/08/2021    Colorectal cancer screening: Referral to GI placed 04/21/2021, order for Cologuard. Pt aware the office will call re: appt.  Mammogram status: Completed 03/19/21. Repeat every year  Bone Density status: Completed 07/28/2017. Results reflect: Bone density results: NORMAL. Repeat every 5 years.  Lung Cancer Screening: (Low Dose CT Chest recommended if Age 74-80 years, 30 pack-year currently smoking OR have quit w/in 15years.) does not qualify.     Additional Screening:  Hepatitis C Screening: does qualify; Completed 02/17/16  Vision Screening: Recommended annual ophthalmology exams for early detection of glaucoma and other disorders of the eye. Is the patient up to date with their annual eye exam?  Yes  Who is the provider or what is the name of the office in which the patient attends annual eye exams? Dr. Krista Blue If pt is not established with a provider, would they like to be referred to a provider to establish care? Yes .   Dental Screening: Recommended annual dental exams for proper oral hygiene  Community Resource Referral / Chronic Care Management: CRR required this visit?  No   CCM required this visit?  No      Plan:     I have personally reviewed and noted the following in the patient's chart:   Medical and social history Use of alcohol, tobacco or illicit drugs  Current medications and supplements including  opioid prescriptions.  Functional ability and status Nutritional status Physical activity Advanced directives List of other physicians Hospitalizations, surgeries, and ER visits in previous 12 months Vitals Screenings to include cognitive, depression, and falls Referrals and appointments  In addition, I have reviewed and discussed with patient certain preventive protocols, quality metrics, and best practice recommendations. A written personalized care plan for preventive services as well as general preventive health recommendations were provided to patient.     Kimberly Driver, LPN   [id number]   Nurse Notes: Pt states she is doing well. Pt states she buried husband 2 years ago today. Pt has never had a Colonoscopy but agrees to Cologuard. Order placed. Up to date on mammogram and bone density. Discussed Covid booster and shingles vaccines.

## 2021-04-21 NOTE — Patient Instructions (Signed)
[name] , Thank you for taking time to come for your Medicare Wellness Visit. I appreciate your ongoing commitment to your health goals. Please review the following plan we discussed and let me know if I can assist you in the future.   Screening recommendations/referrals: Colonoscopy: Never done. Order for Cologuard placed today.  Mammogram: Done 03/19/2021 Repeat annually  Bone Density: Done 07/28/2017. Repeat in 2024.  Recommended yearly ophthalmology/optometry visit for glaucoma screening and checkup Recommended yearly dental visit for hygiene and checkup  Vaccinations: Influenza vaccine: Done 03/29/2021 Repeat annually  Pneumococcal vaccine: Done 04/08/2013 and 05/04/2019 Tdap vaccine: Done 04/09/2011 Repeat in 10 years  Shingles vaccine: Zoster done 05/23/2013 Shingrix discussed. Please contact your pharmacy for coverage information.     Covid-19:Done 07/31/2019, 08/27/2019 and 07/01/2020.  Advanced directives: Please bring a copy of your health care power of attorney and living will to the office to be added to your chart at your convenience.   Conditions/risks identified: Aim for 30 minutes of exercise or brisk walking each day, drink 6-8 glasses of water and eat lots of fruits and vegetables.   Next appointment: Follow up in one year for your annual wellness visit 2023.   Preventive Care 74 Years and Older, Female Preventive care refers to lifestyle choices and visits with your health care provider that can promote health and wellness. What does preventive care include? A yearly physical exam. This is also called an annual well check. Dental exams once or twice a year. Routine eye exams. Ask your health care provider how often you should have your eyes checked. Personal lifestyle choices, including: Daily care of your teeth and gums. Regular physical activity. Eating a healthy diet. Avoiding tobacco and drug use. Limiting alcohol use. Practicing safe sex. Taking low-dose  aspirin every day. Taking vitamin and mineral supplements as recommended by your health care provider. What happens during an annual well check? The services and screenings done by your health care provider during your annual well check will depend on your age, overall health, lifestyle risk factors, and family history of disease. Counseling  Your health care provider may ask you questions about your: Alcohol use. Tobacco use. Drug use. Emotional well-being. Home and relationship well-being. Sexual activity. Eating habits. History of falls. Memory and ability to understand (cognition). Work and work Statistician. Reproductive health. Screening  You may have the following tests or measurements: Height, weight, and BMI. Blood pressure. Lipid and cholesterol levels. These may be checked every 5 years, or more frequently if you are over 41 years old. Skin check. Lung cancer screening. You may have this screening every year starting at age 93 if you have a 30-pack-year history of smoking and currently smoke or have quit within the past 15 years. Fecal occult blood test (FOBT) of the stool. You may have this test every year starting at age 37. Flexible sigmoidoscopy or colonoscopy. You may have a sigmoidoscopy every 5 years or a colonoscopy every 10 years starting at age 31. Hepatitis C blood test. Hepatitis B blood test. Sexually transmitted disease (STD) testing. Diabetes screening. This is done by checking your blood sugar (glucose) after you have not eaten for a while (fasting). You may have this done every 1-3 years. Bone density scan. This is done to screen for osteoporosis. You may have this done starting at age 52. Mammogram. This may be done every 1-2 years. Talk to your health care provider about how often you should have regular mammograms. Talk with your health care  provider about your test results, treatment options, and if necessary, the need for more tests. Vaccines  Your  health care provider may recommend certain vaccines, such as: Influenza vaccine. This is recommended every year. Tetanus, diphtheria, and acellular pertussis (Tdap, Td) vaccine. You may need a Td booster every 10 years. Zoster vaccine. You may need this after age 30. Pneumococcal 13-valent conjugate (PCV13) vaccine. One dose is recommended after age 76. Pneumococcal polysaccharide (PPSV23) vaccine. One dose is recommended after age 89. Talk to your health care provider about which screenings and vaccines you need and how often you need them. This information is not intended to replace advice given to you by your health care provider. Make sure you discuss any questions you have with your health care provider. Document Released: 07/05/2015 Document Revised: 02/26/2016 Document Reviewed: 04/09/2015 Elsevier Interactive Patient Education  2017 [place] Prevention in the Home Falls can cause injuries. They can happen to people of all ages. There are many things you can do to make your home safe and to help prevent falls. What can I do on the outside of my home? Regularly fix the edges of walkways and driveways and fix any cracks. Remove anything that might make you trip as you walk through a door, such as a raised step or threshold. Trim any bushes or trees on the path to your home. Use bright outdoor lighting. Clear any walking paths of anything that might make someone trip, such as rocks or tools. Regularly check to see if handrails are loose or broken. Make sure that both sides of any steps have handrails. Any raised decks and porches should have guardrails on the edges. Have any leaves, snow, or ice cleared regularly. Use sand or salt on walking paths during winter. Clean up any spills in your garage right away. This includes oil or grease spills. What can I do in the bathroom? Use night lights. Install grab bars by the toilet and in the tub and shower. Do not use towel bars as  grab bars. Use non-skid mats or decals in the tub or shower. If you need to sit down in the shower, use a plastic, non-slip stool. Keep the floor dry. Clean up any water that spills on the floor as soon as it happens. Remove soap buildup in the tub or shower regularly. Attach bath mats securely with double-sided non-slip rug tape. Do not have throw rugs and other things on the floor that can make you trip. What can I do in the bedroom? Use night lights. Make sure that you have a light by your bed that is easy to reach. Do not use any sheets or blankets that are too big for your bed. They should not hang down onto the floor. Have a firm chair that has side arms. You can use this for support while you get dressed. Do not have throw rugs and other things on the floor that can make you trip. What can I do in the kitchen? Clean up any spills right away. Avoid walking on wet floors. Keep items that you use a lot in easy-to-reach places. If you need to reach something above you, use a strong step stool that has a grab bar. Keep electrical cords out of the way. Do not use floor polish or wax that makes floors slippery. If you must use wax, use non-skid floor wax. Do not have throw rugs and other things on the floor that can make you trip. What can I  do with my stairs? Do not leave any items on the stairs. Make sure that there are handrails on both sides of the stairs and use them. Fix handrails that are broken or loose. Make sure that handrails are as long as the stairways. Check any carpeting to make sure that it is firmly attached to the stairs. Fix any carpet that is loose or worn. Avoid having throw rugs at the top or bottom of the stairs. If you do have throw rugs, attach them to the floor with carpet tape. Make sure that you have a light switch at the top of the stairs and the bottom of the stairs. If you do not have them, ask someone to add them for you. What else can I do to help prevent  falls? Wear shoes that: Do not have high heels. Have rubber bottoms. Are comfortable and fit you well. Are closed at the toe. Do not wear sandals. If you use a stepladder: Make sure that it is fully opened. Do not climb a closed stepladder. Make sure that both sides of the stepladder are locked into place. Ask someone to hold it for you, if possible. Clearly mark and make sure that you can see: Any grab bars or handrails. First and last steps. Where the edge of each step is. Use tools that help you move around (mobility aids) if they are needed. These include: Canes. Walkers. Scooters. Crutches. Turn on the lights when you go into a dark area. Replace any light bulbs as soon as they burn out. Set up your furniture so you have a clear path. Avoid moving your furniture around. If any of your floors are uneven, fix them. If there are any pets around you, be aware of where they are. Review your medicines with your doctor. Some medicines can make you feel dizzy. This can increase your chance of falling. Ask your doctor what other things that you can do to help prevent falls. This information is not intended to replace advice given to you by your health care provider. Make sure you discuss any questions you have with your health care provider. Document Released: 04/04/2009 Document Revised: 11/14/2015 Document Reviewed: 07/13/2014 Elsevier Interactive Patient Education  2017 [employer].

## 2021-04-22 DIAGNOSIS — I2510 Atherosclerotic heart disease of native coronary artery without angina pectoris: Secondary | ICD-10-CM | POA: Diagnosis not present

## 2021-04-22 DIAGNOSIS — Z79899 Other long term (current) drug therapy: Secondary | ICD-10-CM | POA: Diagnosis not present

## 2021-04-22 DIAGNOSIS — I3481 Nonrheumatic mitral (valve) annulus calcification: Secondary | ICD-10-CM | POA: Diagnosis not present

## 2021-04-22 DIAGNOSIS — Z7982 Long term (current) use of aspirin: Secondary | ICD-10-CM | POA: Diagnosis not present

## 2021-04-22 DIAGNOSIS — M5481 Occipital neuralgia: Secondary | ICD-10-CM | POA: Diagnosis not present

## 2021-04-22 DIAGNOSIS — E785 Hyperlipidemia, unspecified: Secondary | ICD-10-CM | POA: Diagnosis not present

## 2021-04-22 DIAGNOSIS — I10 Essential (primary) hypertension: Secondary | ICD-10-CM | POA: Diagnosis not present

## 2021-04-22 DIAGNOSIS — I6523 Occlusion and stenosis of bilateral carotid arteries: Secondary | ICD-10-CM | POA: Diagnosis not present

## 2021-04-22 DIAGNOSIS — R519 Headache, unspecified: Secondary | ICD-10-CM | POA: Diagnosis not present

## 2021-04-23 DIAGNOSIS — I639 Cerebral infarction, unspecified: Secondary | ICD-10-CM | POA: Diagnosis not present

## 2021-04-23 DIAGNOSIS — I491 Atrial premature depolarization: Secondary | ICD-10-CM | POA: Diagnosis not present

## 2021-04-28 DIAGNOSIS — I471 Supraventricular tachycardia: Secondary | ICD-10-CM | POA: Diagnosis not present

## 2021-05-12 ENCOUNTER — Other Ambulatory Visit: Payer: Self-pay

## 2021-05-12 ENCOUNTER — Encounter: Payer: Self-pay | Admitting: Nurse Practitioner

## 2021-05-12 ENCOUNTER — Ambulatory Visit (INDEPENDENT_AMBULATORY_CARE_PROVIDER_SITE_OTHER): Payer: Medicare Other | Admitting: Nurse Practitioner

## 2021-05-12 VITALS — BP 113/74 | HR 78 | Temp 97.7°F | Resp 20 | Ht 66.0 in | Wt 218.0 lb

## 2021-05-12 DIAGNOSIS — Z7901 Long term (current) use of anticoagulants: Secondary | ICD-10-CM | POA: Diagnosis not present

## 2021-05-12 LAB — COAGUCHEK XS/INR WAIVED
INR: 1.8 — ABNORMAL HIGH (ref 0.9–1.1)
Prothrombin Time: 22.1 s

## 2021-05-12 NOTE — Progress Notes (Signed)
Subjective:   Chief Complaint- INR recheck   Indication: CVA Bleeding signs/symptoms: None Thromboembolic signs/symptoms: None  Missed Coumadin doses: None Medication changes: no Dietary changes: no Bacterial/viral infection: no Other concerns: no  The following portions of the patient's history were reviewed and updated as appropriate: allergies, current medications, past family history, past medical history, past social history, past surgical history, and problem list.  Review of Systems Pertinent items are noted in HPI. Pertinent items noted in HPI and remainder of comprehensive ROS otherwise negative.   Objective:    INR Today: 1.9 Current dose: coumadin 5mg  daily   Assessment:    Therapeutic INR for goal of 2.5-3.5   Plan:    1. New dose: coumadin 10mg  tomorrow ( 05/13/21 )then coumadin 5mg  daily except 7.5mg  on tues and thurs.    2. Next INR: 1 month   Mary-Margaret Hassell Done, FNP

## 2021-05-20 DIAGNOSIS — M5481 Occipital neuralgia: Secondary | ICD-10-CM | POA: Diagnosis not present

## 2021-06-02 ENCOUNTER — Ambulatory Visit (INDEPENDENT_AMBULATORY_CARE_PROVIDER_SITE_OTHER): Payer: Medicare Other | Admitting: Nurse Practitioner

## 2021-06-02 ENCOUNTER — Encounter: Payer: Self-pay | Admitting: Nurse Practitioner

## 2021-06-02 VITALS — BP 125/69 | HR 74 | Temp 98.0°F | Resp 20 | Ht 66.0 in | Wt 219.0 lb

## 2021-06-02 DIAGNOSIS — Z7901 Long term (current) use of anticoagulants: Secondary | ICD-10-CM | POA: Diagnosis not present

## 2021-06-02 LAB — COAGUCHEK XS/INR WAIVED
INR: 2.3 — ABNORMAL HIGH (ref 0.9–1.1)
Prothrombin Time: 27.5 s

## 2021-06-02 NOTE — Progress Notes (Signed)
Subjective:   Chief Complaint: INR recheck   Indication: CVA Bleeding signs/symptoms: None Thromboembolic signs/symptoms: None  Missed Coumadin doses: None Medication changes: no Dietary changes: no Bacterial/viral infection: no Other concerns: no  The following portions of the patient's history were reviewed and updated as appropriate: allergies, current medications, past family history, past medical history, past social history, past surgical history, and problem list. She saw cardiology I a month ago. They told her to stop coumadin and start on plavix. Patient wanted to discuss with me before changing  Review of Systems Pertinent items noted in HPI and remainder of comprehensive ROS otherwise negative.   Objective:    INR Today: 2.3 Current dose: coumadin 7.5mg  on Tues and Thursday and 5mg  all  othe rdays   Assessment:    Therapeutic INR for goal of 2-3   Plan:    1. New dose: Patient is going to stop coumadin and start on plavix   2. Next INR: no more iNR needed  Mary-Margaret Hassell Done, FNP

## 2021-06-02 NOTE — Patient Instructions (Signed)
Clopidogrel Tablets What is this medication? CLOPIDOGREL (kloh PID oh grel) lowers the risk of heart attack, stroke, or blood clots. It prevents blood cells (platelets) from clumping together to form a clot. It belongs to a group of medications called antiplatelets. This medicine may be used for other purposes; ask your health care provider or pharmacist if you have questions. COMMON BRAND NAME(S): Plavix What should I tell my care team before I take this medication? They need to know if you have any of the following conditions: Bleeding disorders Bleeding in the brain Having surgery History of stomach bleeding An unusual or allergic reaction to clopidogrel, other medications, foods, dyes, or preservatives Pregnant or trying to get pregnant Breast-feeding How should I use this medication? Take this medication by mouth with a glass of water. Follow the directions on the prescription label. You may take this medication with or without food. If it upsets your stomach, take it with food. Take your medication at regular intervals. Do not take it more often than directed. Do not stop taking except on your care team's advice. A special MedGuide will be given to you by the pharmacist with each prescription and refill. Be sure to read this information carefully each time. Talk to your care team about the use of this medication in children. Special care may be needed. Overdosage: If you think you have taken too much of this medicine contact a poison control center or emergency room at once. NOTE: This medicine is only for you. Do not share this medicine with others. What if I miss a dose? If you miss a dose, take it as soon as you can. If it is almost time for your next dose, take only that dose. Do not take double or extra doses. What may interact with this medication? Do not take this medication with the following: Dasabuvir; ombitasvir; paritaprevir; ritonavir Defibrotide Selexipag This  medication may also interact with the following: Certain medications that treat or prevent blood clots like warfarin Narcotic medications for pain NSAIDs, medications for pain and inflammation, like ibuprofen or naproxen Repaglinide SNRIs, medications for depression, like desvenlafaxine, duloxetine, levomilnacipran, venlafaxine SSRIs, medications for depression, like citalopram, escitalopram, fluoxetine, fluvoxamine, paroxetine, sertraline Stomach acid blockers like cimetidine, esomeprazole, omeprazole This list may not describe all possible interactions. Give your health care provider a list of all the medicines, herbs, non-prescription drugs, or dietary supplements you use. Also tell them if you smoke, drink alcohol, or use illegal drugs. Some items may interact with your medicine. What should I watch for while using this medication? Visit your care team for regular check-ups. Do not stop taking your medication unless your care team tells you to. Notify your care team and seek emergency services if you develop sudden numbness or weakness of the face, arm, or leg, trouble speaking, confusion, trouble walking, loss of balance or coordination, dizziness, severe headache, or change in vision. These can be signs that your condition has gotten worse. If you are going to have surgery or dental work, tell your care team that you are taking this medication. Certain genetic factors may reduce the effect of this medication. Your care team may use genetic tests to determine treatment. Only take aspirin if you are instructed to. Low doses of aspirin are used with this medication to treat some conditions. Taking aspirin with this medication can increase your risk of bleeding, so you must be careful. Talk to your care team if you have questions. What side effects may I notice from  receiving this medication? Side effects that you should report to your care team as soon as possible: Allergic reactions--skin rash,  itching, hives, swelling of the face, lips, tongue, or throat Bleeding--bloody or black, tar-like stools, red or dark brown urine, vomiting blood or brown material that looks like coffee grounds, small, red or purple spots on the skin, unusual bleeding or bruising TTP--purple spots on the skin or inside the mouth, pale skin, yellowing skin or eyes, unusual weakness or fatigue, fever, fast or irregular heartbeat, confusion, change in vision, trouble speaking, trouble walking Side effects that usually do not require medical attention (report to your care team if they continue or are bothersome): Diarrhea Headache This list may not describe all possible side effects. Call your doctor for medical advice about side effects. You may report side effects to FDA at 1-800-FDA-1088. Where should I keep my medication? Keep out of the reach of children and pets. Store at room temperature of 59 to 86 degrees F (15 to 30 degrees C). Throw away any unused medication after the expiration date. NOTE: This sheet is a summary. It may not cover all possible information. If you have questions about this medicine, talk to your doctor, pharmacist, or health care provider.  2022 Elsevier/Gold Standard (2020-06-11 00:00:00)

## 2021-07-24 DIAGNOSIS — M5481 Occipital neuralgia: Secondary | ICD-10-CM | POA: Diagnosis not present

## 2021-08-06 ENCOUNTER — Other Ambulatory Visit: Payer: Self-pay | Admitting: Nurse Practitioner

## 2021-08-06 NOTE — Telephone Encounter (Signed)
MMM patient Last office visit 06/02/21 Last refill 04/14/21, #30, 1 refill

## 2021-08-16 ENCOUNTER — Other Ambulatory Visit: Payer: Self-pay | Admitting: Nurse Practitioner

## 2021-08-16 DIAGNOSIS — I10 Essential (primary) hypertension: Secondary | ICD-10-CM

## 2021-08-16 DIAGNOSIS — E782 Mixed hyperlipidemia: Secondary | ICD-10-CM

## 2021-09-04 ENCOUNTER — Encounter: Payer: Self-pay | Admitting: Nurse Practitioner

## 2021-09-04 ENCOUNTER — Ambulatory Visit (INDEPENDENT_AMBULATORY_CARE_PROVIDER_SITE_OTHER): Payer: Medicare Other | Admitting: Nurse Practitioner

## 2021-09-04 VITALS — BP 132/71 | HR 78 | Temp 98.0°F | Resp 20 | Ht 66.0 in | Wt 217.0 lb

## 2021-09-04 DIAGNOSIS — I6930 Unspecified sequelae of cerebral infarction: Secondary | ICD-10-CM

## 2021-09-04 DIAGNOSIS — R609 Edema, unspecified: Secondary | ICD-10-CM

## 2021-09-04 DIAGNOSIS — Z23 Encounter for immunization: Secondary | ICD-10-CM

## 2021-09-04 DIAGNOSIS — E034 Atrophy of thyroid (acquired): Secondary | ICD-10-CM | POA: Diagnosis not present

## 2021-09-04 DIAGNOSIS — I10 Essential (primary) hypertension: Secondary | ICD-10-CM | POA: Diagnosis not present

## 2021-09-04 DIAGNOSIS — E782 Mixed hyperlipidemia: Secondary | ICD-10-CM | POA: Diagnosis not present

## 2021-09-04 DIAGNOSIS — N1831 Chronic kidney disease, stage 3a: Secondary | ICD-10-CM | POA: Diagnosis not present

## 2021-09-04 MED ORDER — LOSARTAN POTASSIUM 25 MG PO TABS: 25.0000 mg | ORAL_TABLET | Freq: Every day | ORAL | 1 refills | Status: DC

## 2021-09-04 MED ORDER — BENZONATATE 200 MG PO CAPS
200.0000 mg | ORAL_CAPSULE | Freq: Three times a day (TID) | ORAL | 2 refills | Status: DC | PRN
Start: 2021-09-04 — End: 2022-05-05

## 2021-09-04 MED ORDER — FUROSEMIDE 40 MG PO TABS: 40.0000 mg | ORAL_TABLET | Freq: Every day | ORAL | 1 refills | Status: DC

## 2021-09-04 MED ORDER — LEVOTHYROXINE SODIUM 50 MCG PO TABS: 50.0000 ug | ORAL_TABLET | Freq: Every day | ORAL | 1 refills | Status: DC

## 2021-09-04 MED ORDER — ATORVASTATIN CALCIUM 40 MG PO TABS: 40.0000 mg | ORAL_TABLET | Freq: Every day | ORAL | 1 refills | Status: DC

## 2021-09-04 NOTE — Patient Instructions (Signed)
Peripheral Edema °Peripheral edema is swelling that is caused by a buildup of fluid. Peripheral edema most often affects the lower legs, ankles, and feet. It can also develop in the arms, hands, and face. The area of the body that has peripheral edema will look swollen. It may also feel heavy or warm. Your clothes may start to feel tight. Pressing on the area may make a temporary dent in your skin. You may not be able to move your swollen arm or leg as much as usual. °There are many causes of peripheral edema. It can happen because of a complication of other conditions such as congestive heart failure, kidney disease, or a problem with your blood circulation. It also can be a side effect of certain medicines or because of an infection. It often happens to women during pregnancy. Sometimes, the cause is not known. °Follow these instructions at home: °Managing pain, stiffness, and swelling ° °Raise (elevate) your legs while you are sitting or lying down. °Move around often to prevent stiffness and to lessen swelling. °Do not sit or stand for long periods of time. °Wear support stockings as told by your health care provider. °Medicines °Take over-the-counter and prescription medicines only as told by your health care provider. °Your health care provider may prescribe medicine to help your body get rid of excess water (diuretic). °General instructions °Pay attention to any changes in your symptoms. °Follow instructions from your health care provider about limiting salt (sodium) in your diet. Sometimes, eating less salt may reduce swelling. °Moisturize skin daily to help prevent skin from cracking and draining. °Keep all follow-up visits as told by your health care provider. This is important. °Contact a health care provider if you have: °A fever. °Edema that starts suddenly or is getting worse, especially if you are pregnant or have a medical condition. °Swelling in only one leg. °Increased swelling, redness, or pain in  one or both of your legs. °Drainage or sores at the area where you have edema. °Get help right away if you: °Develop shortness of breath, especially when you are lying down. °Have pain in your chest or abdomen. °Feel weak. °Feel faint. °Summary °Peripheral edema is swelling that is caused by a buildup of fluid. Peripheral edema most often affects the lower legs, ankles, and feet. °Move around often to prevent stiffness and to lessen swelling. Do not sit or stand for long periods of time. °Pay attention to any changes in your symptoms. °Contact a health care provider if you have edema that starts suddenly or is getting worse, especially if you are pregnant or have a medical condition. °Get help right away if you develop shortness of breath, especially when lying down. °This information is not intended to replace advice given to you by your health care provider. Make sure you discuss any questions you have with your health care provider. °Document Revised: 11/07/2020 Document Reviewed: 03/02/2018 °Elsevier Patient Education © 2022 [employer] ° °

## 2021-09-04 NOTE — Progress Notes (Signed)
? ?Subjective:  ? ? Patient ID: Kimberly Walsh, female    DOB: 1945/10/28, 76 y.o.   MRN: [id number] ? ?Chief Complaint: Medical Management of Chronic Issues ?  ? ?HPI: ? ?Kimberly Walsh is a 76 y.o. who identifies as a female who was assigned female at birth.  ? 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 ? ?Comes in today for follow up of the following chronic medical issues: ? ?1. Essential hypertension, benign ?No c/o chest pain, sob or headache. Does not check blood pressure at home. ?BP Readings from Last 3 Encounters:  ?09/04/21 132/71  ?06/02/21 125/69  ?05/12/21 113/74  ? ? ? ?2. Completed stroke ([place]) ?No permanent effects ? ?3. Mixed hyperlipidemia ?Does try to watch diet but does not do much exercise. ?Lab Results  ?Component Value Date  ? CHOL 116 11/29/2020  ? HDL 46 11/29/2020  ? [place] 48 11/29/2020  ? TRIG 123 11/29/2020  ? CHOLHDL 2.5 11/29/2020  ? ? ? ?4. Hypothyroidism due to acquired atrophy of thyroid ?No problems that aware of ?Lab Results  ?Component Value Date  ? TSH 2.720 11/29/2020  ? ? ? ?5. Stage 3a chronic kidney disease ([place]) ?Lab Results  ?Component Value Date  ? CREATININE 1.32 (H) 11/29/2020  ? ? ? ?6. Peripheral edema ?Has daily peripheral edema ? ?7. Late effect of cerebrovascular accident (CVA) ?No permanent deficits. Is no longer on coumadin - she has been changed to plavix. ? ?8. Obese ?No recent weight changes ?Wt Readings from Last 3 Encounters:  ?09/04/21 217 lb (98.4 kg)  ?06/02/21 219 lb (99.3 kg)  ?05/12/21 218 lb (98.9 kg)  ? ?BMI Readings from Last 3 Encounters:  ?09/04/21 35.02 kg/m?  ?06/02/21 35.35 kg/m?  ?05/12/21 35.19 kg/m?  ? ? ?New complaints: ?Had covid for the 3rd time a few weeks ago. Still experiencing some fatigue. ? ?Allergies  ?Allergen Reactions  ? Ace Inhibitors Cough  ? ?Outpatient Encounter Medications as of 09/04/2021  ?Medication Sig  ? atorvastatin (LIPITOR) 40 MG tablet Take 1  tablet by mouth once daily  ? benzonatate (TESSALON) 200 MG capsule Take 200 mg by mouth 3 (three) times daily as needed for cough.  ? clopidogrel (PLAVIX) 75 MG tablet Take by mouth.  ? furosemide (LASIX) 40 MG tablet Take 1 tablet (40 mg total) by mouth daily.  ? levothyroxine (SYNTHROID) 50 MCG tablet Take 1 tablet (50 mcg total) by mouth daily.  ? losartan (COZAAR) 25 MG tablet Take 1 tablet by mouth once daily  ? meclizine (ANTIVERT) 25 MG tablet TAKE 1 TABLET BY MOUTH EVERY 6 HOURS AS NEEDED FOR VERTIGO  ? cyclobenzaprine (FLEXERIL) 10 MG tablet Take 1 tablet by mouth three times daily as needed for muscle spasm (Patient not taking: Reported on 09/04/2021)  ? [DISCONTINUED] fluorouracil (EFUDEX) 5 % cream fluorouracil 5 % topical cream ? APPLY TO AFFECTED AREA(S) ONCE DAILY FOR 6 WEEKS  ? [DISCONTINUED] warfarin (COUMADIN) 5 MG tablet 1-2 tablets as directed daily  ? ?No facility-administered encounter medications on file as of 09/04/2021.  ? ? ?Past Surgical History:  ?Procedure Laterality Date  ? basal carcinoma rt leg    ? CHOLECYSTECTOMY    ? FINGER SURGERY Right   ? Ring finger  ? MIDDLE EAR SURGERY    ? Fungus  ? TUBAL LIGATION    ? VEIN SURGERY    ? ? ?Family History  ?Problem Relation  Age of Onset  ? COPD Father   ? ? ? ? ?Controlled substance contract: n/a ? ? ? ? ?Review of Systems  ?Constitutional:  Negative for diaphoresis.  ?Eyes:  Negative for pain.  ?Respiratory:  Negative for shortness of breath.   ?Cardiovascular:  Negative for chest pain, palpitations and leg swelling.  ?Gastrointestinal:  Negative for abdominal pain.  ?Endocrine: Negative for polydipsia.  ?Skin:  Negative for rash.  ?Neurological:  Negative for dizziness, weakness and headaches.  ?Hematological:  Does not bruise/bleed easily.  ?All other systems reviewed and are negative. ? ?   ?Objective:  ? Physical Exam ?Vitals and nursing note reviewed.  ?Constitutional:   ?   General: She is not in acute distress. ?   Appearance: Normal  appearance. She is well-developed.  ?HENT:  ?   Head: Normocephalic.  ?   Right Ear: Tympanic membrane normal.  ?   Left Ear: Tympanic membrane normal.  ?   Nose: Nose normal.  ?   Mouth/Throat:  ?   Mouth: Mucous membranes are moist.  ?Eyes:  ?   Pupils: Pupils are equal, round, and reactive to light.  ?Neck:  ?   Vascular: No carotid bruit or JVD.  ?Cardiovascular:  ?   Rate and Rhythm: Normal rate and regular rhythm.  ?   Heart sounds: Normal heart sounds.  ?Pulmonary:  ?   Effort: Pulmonary effort is normal. No respiratory distress.  ?   Breath sounds: Normal breath sounds. No wheezing or rales.  ?Chest:  ?   Chest wall: No tenderness.  ?Abdominal:  ?   General: Bowel sounds are normal. There is no distension or abdominal bruit.  ?   Palpations: Abdomen is soft. There is no hepatomegaly, splenomegaly, mass or pulsatile mass.  ?   Tenderness: There is no abdominal tenderness.  ?Musculoskeletal:     ?   General: Normal range of motion.  ?   Cervical back: Normal range of motion and neck supple.  ?   Right lower leg: Edema (2+) present.  ?   Left lower leg: Edema (2+) present.  ?Lymphadenopathy:  ?   Cervical: No cervical adenopathy.  ?Skin: ?   General: Skin is warm and dry.  ?Neurological:  ?   Mental Status: She is alert and oriented to person, place, and time.  ?   Deep Tendon Reflexes: Reflexes are normal and symmetric.  ?Psychiatric:     ?   Behavior: Behavior normal.     ?   Thought Content: Thought content normal.     ?   Judgment: Judgment normal.  ? ? ? ? ?BP 132/71   Pulse 78   Temp 98 ?F (36.7 ?C) (Temporal)   Resp 20   Ht '5\' 6"'$  (1.676 m)   Wt 217 lb (98.4 kg)   SpO2 95%   BMI 35.02 kg/m?  ? ?   ?Assessment & Plan:  ? ?Kimberly Walsh comes in today with chief complaint of Medical Management of Chronic Issues ? ? ?Diagnosis and orders addressed: ? ?1. Essential hypertension, benign ?Low sodium diet ?- losartan (COZAAR) 25 MG tablet; Take 1 tablet (25 mg total) by mouth daily.  Dispense: 90  tablet; Refill: 1 ? ?2. Mixed hyperlipidemia ?Low fat diet ?- atorvastatin (LIPITOR) 40 MG tablet; Take 1 tablet (40 mg total) by mouth daily.  Dispense: 90 tablet; Refill: 1 ? ?3. Hypothyroidism due to acquired atrophy of thyroid ?Labs pending ?- levothyroxine (SYNTHROID) 50 MCG tablet; Take  1 tablet (50 mcg total) by mouth daily.  Dispense: 90 tablet; Refill: 1 ? ?4. Stage 3a chronic kidney disease ([place]) ?Labs pending ? ?5. Peripheral edema ?Elevate legs when sitting ?- furosemide (LASIX) 40 MG tablet; Take 1 tablet (40 mg total) by mouth daily.  Dispense: 90 tablet; Refill: 1 ? ?6. Late effect of cerebrovascular accident (CVA) ?Continue plavix ? ?7. Morbid obesity ([place]) ?Discussed diet and exercise for person with BMI >25 ?Will recheck weight in 3-6 months ? ? ? ?Labs pending ?Health Maintenance reviewed ?Diet and exercise encouraged ? ?Follow up plan: ?6 months ? ? ?Mary-Margaret Hassell Done, FNP ? ?

## 2021-09-04 NOTE — Addendum Note (Signed)
Addended by: Rolena Infante on: 09/04/2021 04:15 PM ? ? Modules accepted: Orders ? ?

## 2021-09-08 DIAGNOSIS — I83892 Varicose veins of left lower extremities with other complications: Secondary | ICD-10-CM | POA: Diagnosis not present

## 2021-09-08 DIAGNOSIS — I872 Venous insufficiency (chronic) (peripheral): Secondary | ICD-10-CM | POA: Diagnosis not present

## 2021-09-08 DIAGNOSIS — I83891 Varicose veins of right lower extremities with other complications: Secondary | ICD-10-CM | POA: Diagnosis not present

## 2021-09-08 DIAGNOSIS — R2241 Localized swelling, mass and lump, right lower limb: Secondary | ICD-10-CM | POA: Diagnosis not present

## 2021-09-22 ENCOUNTER — Other Ambulatory Visit: Payer: Self-pay | Admitting: Family Medicine

## 2021-09-22 NOTE — Telephone Encounter (Signed)
Last OV 09/04/2021. Last RF 08/06/2021 #30 no refills. Next OV 03/09/2022 ? ?

## 2021-09-24 DIAGNOSIS — M170 Bilateral primary osteoarthritis of knee: Secondary | ICD-10-CM | POA: Diagnosis not present

## 2021-10-01 DIAGNOSIS — Z7902 Long term (current) use of antithrombotics/antiplatelets: Secondary | ICD-10-CM | POA: Diagnosis not present

## 2021-10-01 DIAGNOSIS — I10 Essential (primary) hypertension: Secondary | ICD-10-CM | POA: Diagnosis not present

## 2021-10-01 DIAGNOSIS — Z6834 Body mass index (BMI) 34.0-34.9, adult: Secondary | ICD-10-CM | POA: Diagnosis not present

## 2021-10-01 DIAGNOSIS — R011 Cardiac murmur, unspecified: Secondary | ICD-10-CM | POA: Diagnosis not present

## 2021-10-01 DIAGNOSIS — M170 Bilateral primary osteoarthritis of knee: Secondary | ICD-10-CM | POA: Diagnosis not present

## 2021-10-01 DIAGNOSIS — I471 Supraventricular tachycardia: Secondary | ICD-10-CM | POA: Diagnosis not present

## 2021-10-22 ENCOUNTER — Other Ambulatory Visit: Payer: Self-pay | Admitting: Nurse Practitioner

## 2021-10-24 DIAGNOSIS — I3481 Nonrheumatic mitral (valve) annulus calcification: Secondary | ICD-10-CM | POA: Diagnosis not present

## 2021-10-24 DIAGNOSIS — I517 Cardiomegaly: Secondary | ICD-10-CM | POA: Diagnosis not present

## 2021-10-24 DIAGNOSIS — I080 Rheumatic disorders of both mitral and aortic valves: Secondary | ICD-10-CM | POA: Diagnosis not present

## 2021-11-01 ENCOUNTER — Other Ambulatory Visit: Payer: Self-pay | Admitting: Nurse Practitioner

## 2021-11-18 DIAGNOSIS — R799 Abnormal finding of blood chemistry, unspecified: Secondary | ICD-10-CM | POA: Diagnosis not present

## 2021-11-18 DIAGNOSIS — G4486 Cervicogenic headache: Secondary | ICD-10-CM | POA: Diagnosis not present

## 2021-11-18 DIAGNOSIS — R9389 Abnormal findings on diagnostic imaging of other specified body structures: Secondary | ICD-10-CM | POA: Diagnosis not present

## 2021-12-03 IMAGING — MG MM DIGITAL SCREENING BILAT W/ TOMO AND CAD
6 of 10 series · 6 of 30 positions shown · non-contrast
Comparison: Previous exam(s).

CLINICAL DATA: Screening.

EXAM:
DIGITAL SCREENING BILATERAL MAMMOGRAM WITH TOMOSYNTHESIS AND CAD
TECHNIQUE: Bilateral screening digital craniocaudal and mediolateral oblique
mammograms were obtained. Bilateral screening digital breast
tomosynthesis was performed. The images were evaluated with
computer-aided detection.

[R CC synth-2D]
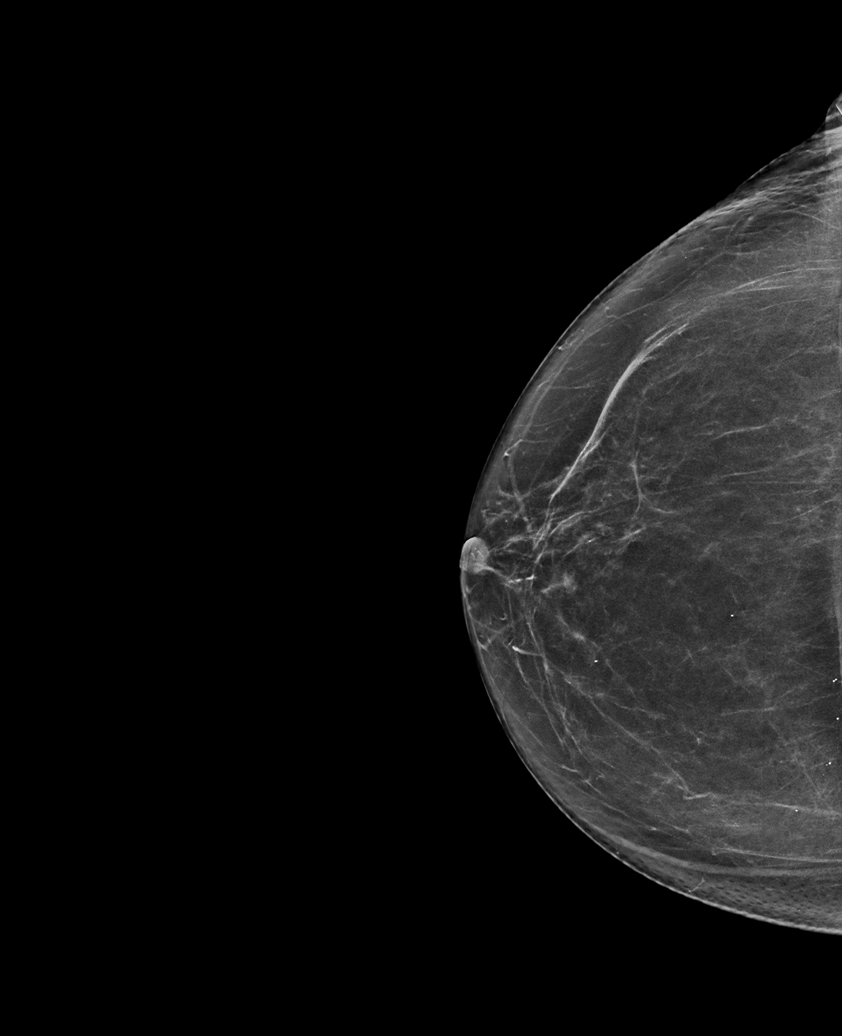

[L MLO synth-2D (1 of 2)]
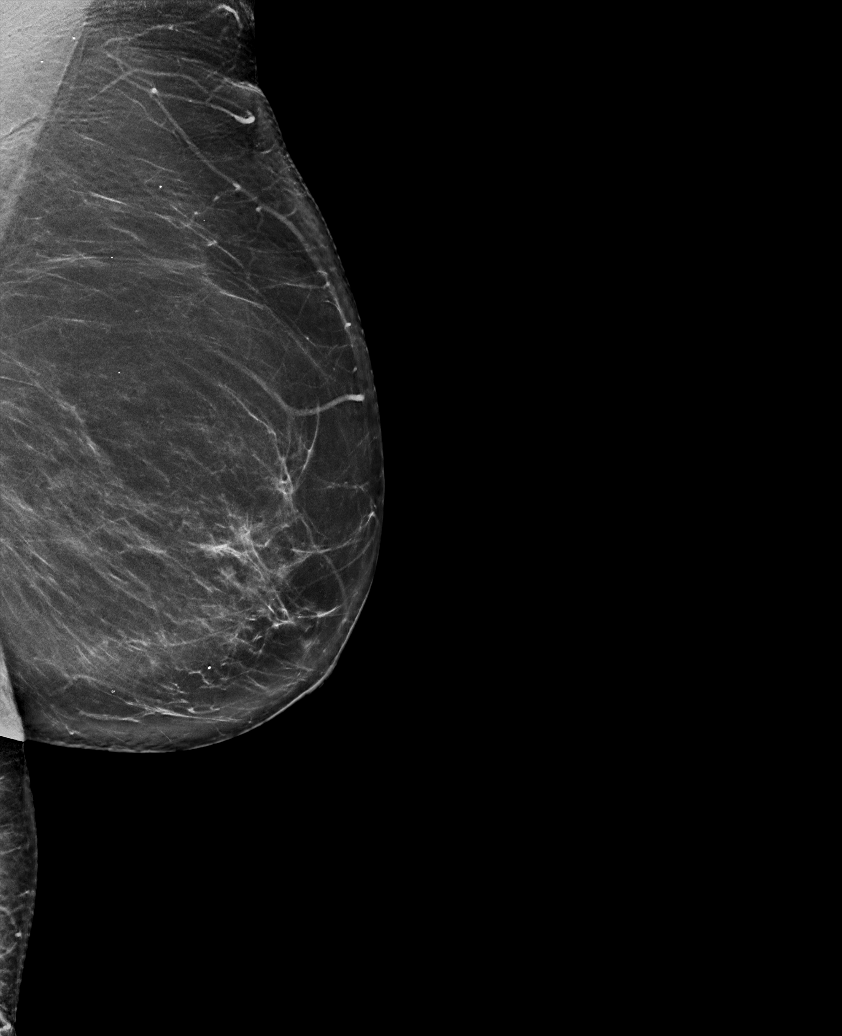

[L CC synth-2D]
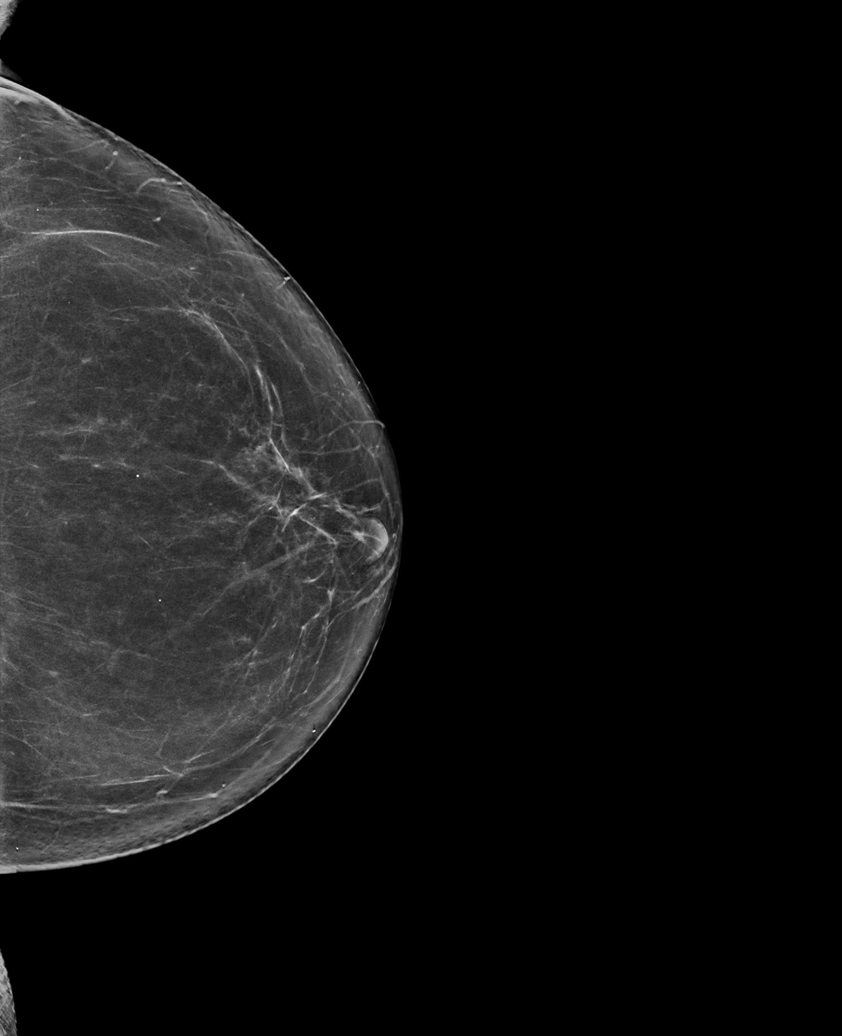

[R MLO synth-2D]
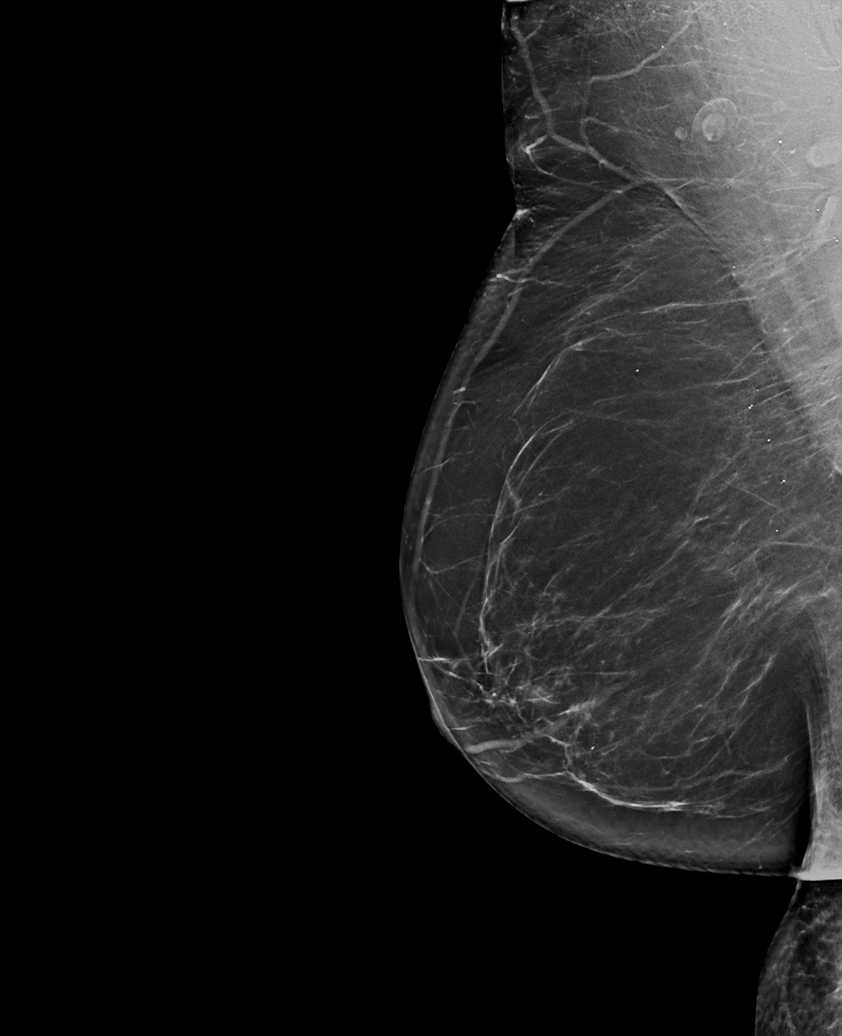

[L MLO synth-2D (2 of 2)]
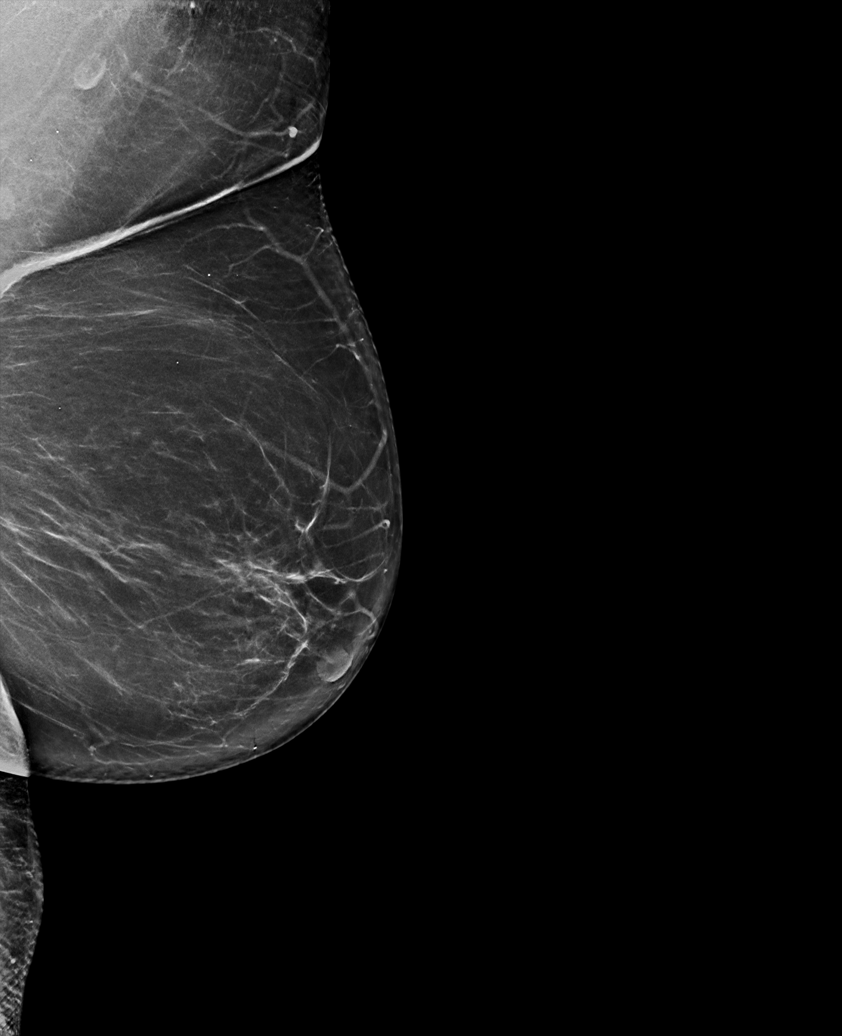

[R MLO tomo · tomo slice 40/79.0]
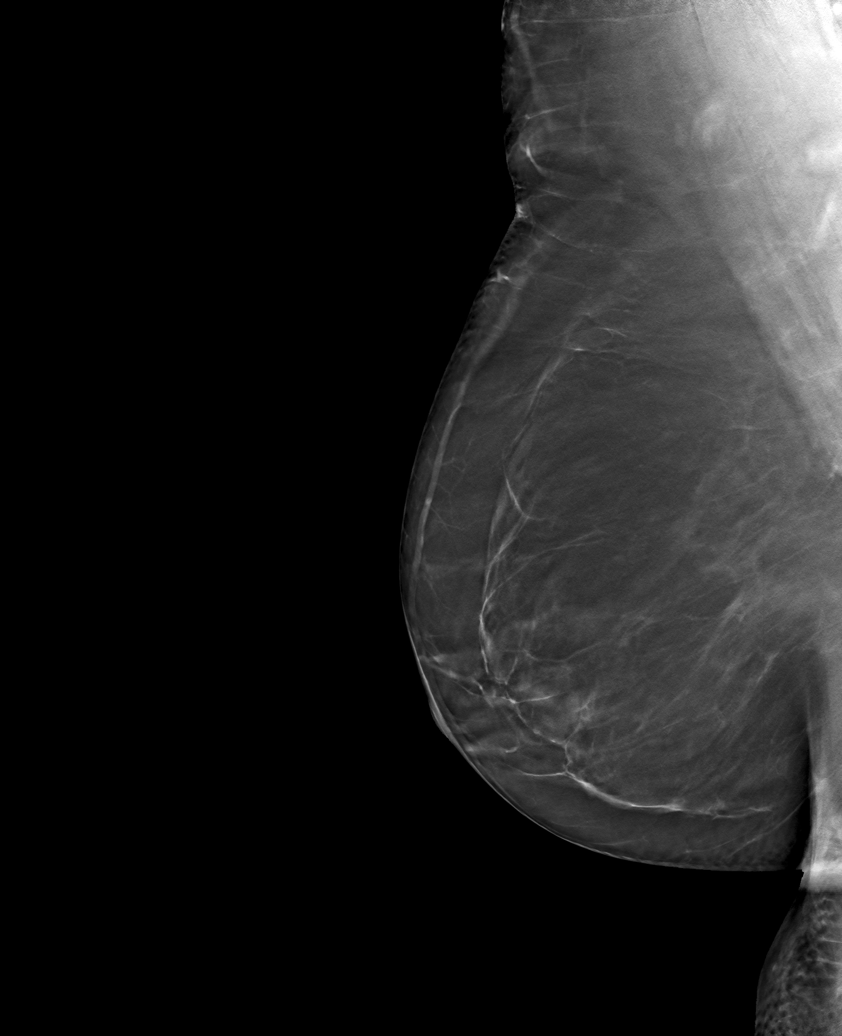

[6 of 30 positions shown; findings below may reference images not displayed]

ACR Breast Density Category b: There are scattered areas of
fibroglandular density.
FINDINGS: There are no findings suspicious for malignancy.
IMPRESSION: No mammographic evidence of malignancy. A result letter of this
screening mammogram will be mailed directly to the patient.

RECOMMENDATION:
Screening mammogram in one year. (Code:51-O-LD2)

BI-RADS CATEGORY  1: Negative.

## 2021-12-15 ENCOUNTER — Ambulatory Visit (INDEPENDENT_AMBULATORY_CARE_PROVIDER_SITE_OTHER): Payer: Medicare Other | Admitting: *Deleted

## 2021-12-15 DIAGNOSIS — E538 Deficiency of other specified B group vitamins: Secondary | ICD-10-CM | POA: Diagnosis not present

## 2021-12-15 MED ORDER — CYANOCOBALAMIN 1000 MCG/ML IJ SOLN
1000.0000 ug | INTRAMUSCULAR | Status: AC
  Administered 2021-12-29 – 2022-01-19 (×4): 1000 ug via INTRAMUSCULAR

## 2021-12-15 MED ORDER — CYANOCOBALAMIN 1000 MCG/ML IJ SOLN
1000.0000 ug | Freq: Every day | INTRAMUSCULAR | Status: AC
  Administered 2021-12-15 – 2021-12-19 (×5): 1000 ug via INTRAMUSCULAR

## 2021-12-16 ENCOUNTER — Ambulatory Visit (INDEPENDENT_AMBULATORY_CARE_PROVIDER_SITE_OTHER): Payer: Medicare Other

## 2021-12-16 DIAGNOSIS — E538 Deficiency of other specified B group vitamins: Secondary | ICD-10-CM

## 2021-12-17 ENCOUNTER — Ambulatory Visit (INDEPENDENT_AMBULATORY_CARE_PROVIDER_SITE_OTHER): Payer: Medicare Other | Admitting: Emergency Medicine

## 2021-12-17 DIAGNOSIS — E538 Deficiency of other specified B group vitamins: Secondary | ICD-10-CM | POA: Diagnosis not present

## 2021-12-17 NOTE — Progress Notes (Signed)
Patient presents for B12 injection. Injection given in Right Deltoid. Patient tolerated well.    

## 2021-12-18 ENCOUNTER — Ambulatory Visit (INDEPENDENT_AMBULATORY_CARE_PROVIDER_SITE_OTHER): Payer: Medicare Other

## 2021-12-18 DIAGNOSIS — E538 Deficiency of other specified B group vitamins: Secondary | ICD-10-CM | POA: Diagnosis not present

## 2021-12-18 NOTE — Progress Notes (Signed)
Cyanocobalamin injection given to left deltoid.  Patient tolerated well. 

## 2021-12-19 ENCOUNTER — Ambulatory Visit (INDEPENDENT_AMBULATORY_CARE_PROVIDER_SITE_OTHER): Payer: Medicare Other

## 2021-12-19 DIAGNOSIS — E538 Deficiency of other specified B group vitamins: Secondary | ICD-10-CM

## 2021-12-19 NOTE — Progress Notes (Signed)
Cyanocobalamin injection given to right deltoid.  Patient tolerated well. 

## 2021-12-29 ENCOUNTER — Ambulatory Visit (INDEPENDENT_AMBULATORY_CARE_PROVIDER_SITE_OTHER): Payer: Medicare Other

## 2021-12-29 DIAGNOSIS — E538 Deficiency of other specified B group vitamins: Secondary | ICD-10-CM | POA: Diagnosis not present

## 2021-12-29 NOTE — Progress Notes (Signed)
Cyanocobalamin injection given to left deltoid.  Patient tolerated well. 

## 2022-01-05 ENCOUNTER — Ambulatory Visit: Payer: Medicare Other

## 2022-01-05 ENCOUNTER — Ambulatory Visit (INDEPENDENT_AMBULATORY_CARE_PROVIDER_SITE_OTHER): Payer: Medicare Other | Admitting: *Deleted

## 2022-01-05 DIAGNOSIS — I781 Nevus, non-neoplastic: Secondary | ICD-10-CM | POA: Diagnosis not present

## 2022-01-05 DIAGNOSIS — I8310 Varicose veins of unspecified lower extremity with inflammation: Secondary | ICD-10-CM | POA: Diagnosis not present

## 2022-01-05 DIAGNOSIS — L820 Inflamed seborrheic keratosis: Secondary | ICD-10-CM | POA: Diagnosis not present

## 2022-01-05 DIAGNOSIS — L821 Other seborrheic keratosis: Secondary | ICD-10-CM | POA: Diagnosis not present

## 2022-01-05 DIAGNOSIS — D485 Neoplasm of uncertain behavior of skin: Secondary | ICD-10-CM | POA: Diagnosis not present

## 2022-01-05 DIAGNOSIS — L570 Actinic keratosis: Secondary | ICD-10-CM | POA: Diagnosis not present

## 2022-01-05 DIAGNOSIS — E538 Deficiency of other specified B group vitamins: Secondary | ICD-10-CM

## 2022-01-05 DIAGNOSIS — Z85828 Personal history of other malignant neoplasm of skin: Secondary | ICD-10-CM | POA: Diagnosis not present

## 2022-01-05 DIAGNOSIS — L814 Other melanin hyperpigmentation: Secondary | ICD-10-CM | POA: Diagnosis not present

## 2022-01-06 ENCOUNTER — Ambulatory Visit: Payer: Medicare Other | Admitting: Nurse Practitioner

## 2022-01-12 ENCOUNTER — Ambulatory Visit (INDEPENDENT_AMBULATORY_CARE_PROVIDER_SITE_OTHER): Payer: Medicare Other

## 2022-01-12 ENCOUNTER — Ambulatory Visit: Payer: Medicare Other

## 2022-01-12 DIAGNOSIS — E538 Deficiency of other specified B group vitamins: Secondary | ICD-10-CM | POA: Diagnosis not present

## 2022-01-12 NOTE — Progress Notes (Signed)
Cyanocobalamin injection given to left deltoid.  Patient tolerated well. 

## 2022-01-13 ENCOUNTER — Ambulatory Visit: Payer: Medicare Other | Admitting: Nurse Practitioner

## 2022-01-19 ENCOUNTER — Ambulatory Visit: Payer: Medicare Other

## 2022-01-19 ENCOUNTER — Ambulatory Visit (INDEPENDENT_AMBULATORY_CARE_PROVIDER_SITE_OTHER): Payer: Medicare Other

## 2022-01-19 DIAGNOSIS — E538 Deficiency of other specified B group vitamins: Secondary | ICD-10-CM | POA: Diagnosis not present

## 2022-01-19 NOTE — Progress Notes (Signed)
B12 injection given to patient and tolerated well.  

## 2022-01-26 ENCOUNTER — Ambulatory Visit (INDEPENDENT_AMBULATORY_CARE_PROVIDER_SITE_OTHER): Payer: Medicare Other

## 2022-01-26 DIAGNOSIS — E538 Deficiency of other specified B group vitamins: Secondary | ICD-10-CM | POA: Diagnosis not present

## 2022-01-26 MED ORDER — CYANOCOBALAMIN 1000 MCG/ML IJ SOLN
1000.0000 ug | INTRAMUSCULAR | Status: AC
  Administered 2022-01-26 – 2023-01-14 (×11): 1000 ug via INTRAMUSCULAR

## 2022-01-26 NOTE — Progress Notes (Signed)
Cyanocobalamin injection given to right deltoid.  Patient tolerated well. 

## 2022-01-27 DIAGNOSIS — I6523 Occlusion and stenosis of bilateral carotid arteries: Secondary | ICD-10-CM | POA: Diagnosis not present

## 2022-01-27 DIAGNOSIS — M5481 Occipital neuralgia: Secondary | ICD-10-CM | POA: Diagnosis not present

## 2022-01-27 DIAGNOSIS — Z79899 Other long term (current) drug therapy: Secondary | ICD-10-CM | POA: Diagnosis not present

## 2022-01-27 DIAGNOSIS — I639 Cerebral infarction, unspecified: Secondary | ICD-10-CM | POA: Diagnosis not present

## 2022-01-27 DIAGNOSIS — R799 Abnormal finding of blood chemistry, unspecified: Secondary | ICD-10-CM | POA: Diagnosis not present

## 2022-01-27 DIAGNOSIS — E785 Hyperlipidemia, unspecified: Secondary | ICD-10-CM | POA: Diagnosis not present

## 2022-01-27 DIAGNOSIS — I3481 Nonrheumatic mitral (valve) annulus calcification: Secondary | ICD-10-CM | POA: Diagnosis not present

## 2022-01-27 DIAGNOSIS — E559 Vitamin D deficiency, unspecified: Secondary | ICD-10-CM | POA: Diagnosis not present

## 2022-01-27 DIAGNOSIS — I10 Essential (primary) hypertension: Secondary | ICD-10-CM | POA: Diagnosis not present

## 2022-01-27 DIAGNOSIS — Z7902 Long term (current) use of antithrombotics/antiplatelets: Secondary | ICD-10-CM | POA: Diagnosis not present

## 2022-01-27 DIAGNOSIS — Z8673 Personal history of transient ischemic attack (TIA), and cerebral infarction without residual deficits: Secondary | ICD-10-CM | POA: Diagnosis not present

## 2022-01-27 DIAGNOSIS — I6503 Occlusion and stenosis of bilateral vertebral arteries: Secondary | ICD-10-CM | POA: Diagnosis not present

## 2022-01-28 ENCOUNTER — Ambulatory Visit: Payer: Self-pay | Admitting: *Deleted

## 2022-01-28 NOTE — Patient Instructions (Signed)
Kimberly Walsh  At some point during the past 4 years, I have worked with you through the [facility] Program (CCM) at [facility]. We have not worked together within the past 6 months.   Due to program changes I am removing myself from your care team.   If you are currently active with another CCM Team Member, you will remain active with them unless they reach out to you with additional information.   If you feel that you need services in the future,  please talk with your primary care provider and request a new referral for Care Management or Care Coordination services. This does not affect your status as a patient at [facility].   Thank you for allowing me to participate in your your healthcare journey.  Chong Sicilian, BSN, RN-BC Proofreader Dial: [phone]

## 2022-01-28 NOTE — Chronic Care Management (AMB) (Signed)
  Chronic Care Management   Note  01/28/2022 Name: Kimberly Walsh MRN: [id number] DOB: 1945/08/30   Due to changes in the Chronic Care Management program, I am removing myself as the [place] from the Care Team and closing any RN Care Management Care Plans. The patient has not worked with the Consulting civil engineer within the past 6 months. Patient was not scheduled to be followed by the RN Care Coordination nurse for [facility].   Patient does not have an open Care Plan with another CCM team member. Patient does not have a current CCM referral placed since 10/20/21. CCM enrollment status changed to "not enrolled".   Patient's PCP can place a new referral if the they needs Care Management or Care Coordination services in the future.  Chong Sicilian, BSN, RN-BC Proofreader Dial: [phone]

## 2022-03-02 ENCOUNTER — Ambulatory Visit (INDEPENDENT_AMBULATORY_CARE_PROVIDER_SITE_OTHER): Payer: Medicare Other | Admitting: *Deleted

## 2022-03-02 DIAGNOSIS — E538 Deficiency of other specified B group vitamins: Secondary | ICD-10-CM

## 2022-03-02 NOTE — Progress Notes (Signed)
Pt in for B12 injection. Right deltoid. Pt tolerated well.

## 2022-03-07 ENCOUNTER — Other Ambulatory Visit: Payer: Self-pay | Admitting: Nurse Practitioner

## 2022-03-07 DIAGNOSIS — E034 Atrophy of thyroid (acquired): Secondary | ICD-10-CM

## 2022-03-09 ENCOUNTER — Ambulatory Visit: Payer: Medicare Other | Admitting: Nurse Practitioner

## 2022-03-10 ENCOUNTER — Encounter: Payer: Self-pay | Admitting: Nurse Practitioner

## 2022-03-10 ENCOUNTER — Ambulatory Visit (INDEPENDENT_AMBULATORY_CARE_PROVIDER_SITE_OTHER): Payer: Medicare Other | Admitting: Nurse Practitioner

## 2022-03-10 VITALS — BP 120/63 | HR 65 | Temp 98.0°F | Resp 20 | Ht 66.0 in | Wt 213.0 lb

## 2022-03-10 DIAGNOSIS — E034 Atrophy of thyroid (acquired): Secondary | ICD-10-CM | POA: Diagnosis not present

## 2022-03-10 DIAGNOSIS — E782 Mixed hyperlipidemia: Secondary | ICD-10-CM

## 2022-03-10 DIAGNOSIS — R609 Edema, unspecified: Secondary | ICD-10-CM | POA: Diagnosis not present

## 2022-03-10 DIAGNOSIS — N1831 Chronic kidney disease, stage 3a: Secondary | ICD-10-CM

## 2022-03-10 DIAGNOSIS — Z6837 Body mass index (BMI) 37.0-37.9, adult: Secondary | ICD-10-CM | POA: Diagnosis not present

## 2022-03-10 DIAGNOSIS — I10 Essential (primary) hypertension: Secondary | ICD-10-CM

## 2022-03-10 DIAGNOSIS — M19041 Primary osteoarthritis, right hand: Secondary | ICD-10-CM

## 2022-03-10 DIAGNOSIS — M19042 Primary osteoarthritis, left hand: Secondary | ICD-10-CM | POA: Diagnosis not present

## 2022-03-10 DIAGNOSIS — I6930 Unspecified sequelae of cerebral infarction: Secondary | ICD-10-CM | POA: Diagnosis not present

## 2022-03-10 MED ORDER — LEVOTHYROXINE SODIUM 50 MCG PO TABS: 50.0000 ug | ORAL_TABLET | Freq: Every day | ORAL | 1 refills | Status: DC

## 2022-03-10 MED ORDER — LOSARTAN POTASSIUM 25 MG PO TABS: 25.0000 mg | ORAL_TABLET | Freq: Every day | ORAL | 1 refills | Status: DC

## 2022-03-10 MED ORDER — ATORVASTATIN CALCIUM 40 MG PO TABS: 40.0000 mg | ORAL_TABLET | Freq: Every day | ORAL | 1 refills | Status: DC

## 2022-03-10 MED ORDER — CLOPIDOGREL BISULFATE 75 MG PO TABS: 75.0000 mg | ORAL_TABLET | Freq: Every day | ORAL | 1 refills | Status: DC

## 2022-03-10 MED ORDER — FUROSEMIDE 40 MG PO TABS: 40.0000 mg | ORAL_TABLET | Freq: Every day | ORAL | 1 refills | Status: DC

## 2022-03-10 NOTE — Progress Notes (Signed)
Subjective:    Patient ID: Kimberly Walsh, female    DOB: [birth year], 76 y.o.   MRN: [id number]   Chief Complaint: medical management of chronic issues     HPI:  Kimberly Walsh is a 76 y.o. who identifies as a female who was assigned female at birth.   Social history: Lives with: by herself Work history: works in [employer] in today for follow up of the following chronic medical issues:  1. Essential hypertension, benign No c/o chest pain, sob or headache. Doe snot check blood pressure at home BP Readings from Last 3 Encounters:  09/04/21 132/71  06/02/21 125/69  05/12/21 113/74     2. Mixed hyperlipidemia Does try to watch diet but is not able  to do exercise due to OA of bil knees. Lab Results  Component Value Date   CHOL 116 11/29/2020   HDL 46 11/29/2020   LDLCALC 48 11/29/2020   TRIG 123 11/29/2020   CHOLHDL 2.5 11/29/2020     3. Late effect of cerebrovascular accident (CVA) No permanent effects  4. Stage 3a chronic kidney disease ([facility]) No voiding issues Lab Results  Component Value Date   CREATININE 1.32 (H) 11/29/2020     5. Hypothyroidism due to acquired atrophy of thyroid No problems that she is aware of. Lab Results  Component Value Date   TSH 2.720 11/29/2020     6. Peripheral edema Has daily edema of lower ext. She takes lasix daily. Right leg swells worse then left  7. Primary osteoarthritis of both hands Bothers her some. She also has in her knees and she gets shots every 6 months.   8. BMI 37.0-37.9, adult Weight is down 4 lbs Wt Readings from Last 3 Encounters:  03/10/22 213 lb (96.6 kg)  09/04/21 217 lb (98.4 kg)  06/02/21 219 lb (99.3 kg)   BMI Readings from Last 3 Encounters:  03/10/22 34.38 kg/m  09/04/21 35.02 kg/m  06/02/21 35.35 kg/m     New complaints: None today  Allergies  Allergen Reactions   Ace Inhibitors Cough   Outpatient Encounter Medications as of 03/10/2022  Medication Sig    atorvastatin (LIPITOR) 40 MG tablet Take 1 tablet (40 mg total) by mouth daily.   benzonatate (TESSALON) 200 MG capsule Take 1 capsule (200 mg total) by mouth 3 (three) times daily as needed for cough.   clopidogrel (PLAVIX) 75 MG tablet Take by mouth.   cyclobenzaprine (FLEXERIL) 10 MG tablet Take 1 tablet by mouth three times daily as needed for muscle spasm   furosemide (LASIX) 40 MG tablet Take 1 tablet (40 mg total) by mouth daily.   levothyroxine (SYNTHROID) 50 MCG tablet Take 1 tablet by mouth once daily   losartan (COZAAR) 25 MG tablet Take 1 tablet (25 mg total) by mouth daily.   meclizine (ANTIVERT) 25 MG tablet TAKE 1 TABLET BY MOUTH EVERY 6 HOURS AS NEEDED FOR VERTIGO   Facility-Administered Encounter Medications as of 03/10/2022  Medication   cyanocobalamin (VITAMIN B12) injection 1,000 mcg    Past Surgical History:  Procedure Laterality Date   basal carcinoma rt leg     CHOLECYSTECTOMY     FINGER SURGERY Right    Ring finger   MIDDLE EAR SURGERY     Fungus   TUBAL LIGATION     VEIN SURGERY      Family History  Problem Relation Age of Onset   COPD Father  Controlled substance contract: n/a     Review of Systems  Constitutional:  Negative for diaphoresis.  Eyes:  Negative for pain.  Respiratory:  Negative for shortness of breath.   Cardiovascular:  Negative for chest pain, palpitations and leg swelling.  Gastrointestinal:  Negative for abdominal pain.  Endocrine: Negative for polydipsia.  Skin:  Negative for rash.  Neurological:  Negative for dizziness, weakness and headaches.  Hematological:  Does not bruise/bleed easily.  All other systems reviewed and are negative.      Objective:   Physical Exam Vitals and nursing note reviewed.  Constitutional:      General: She is not in acute distress.    Appearance: Normal appearance. She is well-developed.  HENT:     Head: Normocephalic.     Right Ear: Tympanic membrane normal.     Left Ear:  Tympanic membrane normal.     Nose: Nose normal.     Mouth/Throat:     Mouth: Mucous membranes are moist.  Eyes:     Pupils: Pupils are equal, round, and reactive to light.  Neck:     Vascular: No carotid bruit or JVD.  Cardiovascular:     Rate and Rhythm: Normal rate and regular rhythm.     Heart sounds: Normal heart sounds.  Pulmonary:     Effort: Pulmonary effort is normal. No respiratory distress.     Breath sounds: Normal breath sounds. No wheezing or rales.  Chest:     Chest wall: No tenderness.  Abdominal:     General: Bowel sounds are normal. There is no distension or abdominal bruit.     Palpations: Abdomen is soft. There is no hepatomegaly, splenomegaly, mass or pulsatile mass.     Tenderness: There is no abdominal tenderness.  Musculoskeletal:        General: Normal range of motion.     Cervical back: Normal range of motion and neck supple.     Right lower leg: Edema (2+) present.  Lymphadenopathy:     Cervical: No cervical adenopathy.  Skin:    General: Skin is warm and dry.  Neurological:     Mental Status: She is alert and oriented to person, place, and time.     Deep Tendon Reflexes: Reflexes are normal and symmetric.  Psychiatric:        Behavior: Behavior normal.        Thought Content: Thought content normal.        Judgment: Judgment normal.    BP 120/63   Pulse 65   Temp 98 F (36.7 C) (Temporal)   Resp 20   Ht 5' 6"  (1.676 m)   Wt 213 lb (96.6 kg)   SpO2 98%   BMI 34.38 kg/m         Assessment & Plan:   DAILYNN NANCARROW comes in today with chief complaint of Medical Management of Chronic Issues   Diagnosis and orders addressed:  1. Essential hypertension, benign Lpow sodium diet - losartan (COZAAR) 25 MG tablet; Take 1 tablet (25 mg total) by mouth daily.  Dispense: 90 tablet; Refill: 1 - CBC with Differential/Platelet - CMP14+EGFR  2. Mixed hyperlipidemia Low fat diet - atorvastatin (LIPITOR) 40 MG tablet; Take 1 tablet (40 mg  total) by mouth daily.  Dispense: 90 tablet; Refill: 1 - Lipid panel  3. Late effect of cerebrovascular accident (CVA) Fall prevention - clopidogrel (PLAVIX) 75 MG tablet; Take 1 tablet (75 mg total) by mouth daily.  Dispense: 90 tablet; Refill:  1  4. Stage 3a chronic kidney disease ([place]) Labs pending  5. Hypothyroidism due to acquired atrophy of thyroid Labs pending - levothyroxine (SYNTHROID) 50 MCG tablet; Take 1 tablet (50 mcg total) by mouth daily.  Dispense: 90 tablet; Refill: 1  6. Peripheral edema Unna boot applied Remove friday - Apply unna boot - furosemide (LASIX) 40 MG tablet; Take 1 tablet (40 mg total) by mouth daily.  Dispense: 90 tablet; Refill: 1  7. Primary osteoarthritis of both hands  8. BMI 37.0-37.9, adult Discussed diet and exercise for person with BMI >25 Will recheck weight in 3-6 months    Labs pending Health Maintenance reviewed Diet and exercise encouraged  Follow up plan: 6 month   [place], FNP

## 2022-03-10 NOTE — Patient Instructions (Signed)
[facility] An [employer] is a type of bandage (dressing) for the foot and leg. The dressing is a gauze wrap that is soaked with a type of medicine called zinc oxide. The gauze may also include other lotions and medicines that help in wound healing, such as calamine. An Unna boot may be used to treat: Open sores (ulcers) on the foot, heel, or leg. Swelling from disorders that affect the veins or lymphatic system (lymphedema). Skin conditions such as chronic inflammation caused by poor blood flow (stasis dermatitis). The dressing is applied by a health care provider. The gauze is wrapped around your lower extremity in several layers, usually starting at the toes and going upward to the knee. A dry outer wrap goes over the medicated wrap for support and compression.  Before applying the [employer], your health care provider will clean your leg and foot and may apply an antibiotic ointment. You may be asked to raise (elevate) your leg for a while to reduce swelling before the boot is applied. The boot will dry and harden after it is applied. The boot may need to be changed or replaced about twice a week. Follow these instructions at home: [place] as told by your health care provider. You may need to wear a slipper or shoe over the boot that is one or two sizes larger than normal. Check the skin around the boot every day. Tell your health care provider about any concerns. Do not stick anything inside the boot to scratch your skin. Doing that increases your risk of infection. Keep your [employer] clean and dry. Check every day for signs of infection. Check for: Redness, swelling, or pain in your foot or toes. Fluid or blood coming from the boot. Pus or a bad smell coming from the boot. Remove the boot and call your health care provider if you have signs of poor blood flow, such as: Your toes tingle or become numb. Your toes turn cold or turn blue or pale. Your toes are more  swollen or painful. You are unable to move your toes. Activity You may walk with the boot once it has dried. Ask your health care provider how much walking is safe for you. Avoid sitting for a long time without moving. Get up to take short walks as told by your health care provider. This is important to improve blood flow. Bathing Do not take baths, swim, or use a hot tub until your health care provider approves. Ask your health care provider if you may take showers. If your health care provider approves a bath or a shower, do not let the Unna boot get wet. If you take a shower, cover the boot with a watertight covering. If you take a bath, keep your leg with the boot out of the tub. General instructions Keep your leg elevated above the level of your heart while you are sitting or lying down. This will decrease swelling. Do not sit with your knee bent for long periods of time. Take over-the-counter and prescription medicines only as told by your health care provider. Do not use any products that contain nicotine or tobacco, such as cigarettes, e-cigarettes, and chewing tobacco. These can delay healing. If you need help quitting, ask your health care provider. Keep all follow-up visits as told by your health care provider. This is important. Contact a health care provider if: Your skin feels itchy inside the boot. You have a burning sensation, a  rash, or itchy, red, swollen areas of skin (hives) in the boot area. You have a fever or chills. You have any signs of infection, such as: New redness, swelling, or pain. More fluid or blood coming from the boot. Pus or a bad smell coming from the boot. You have increased numbness or pain in your foot or toes. You have any changes in skin color on your foot or toes, such as the skin turning blue or pale or developing patchy areas with spots. Your boot has been damaged or feels like it is no longer fitting properly. Summary An [place]ta [place] boot is a type of  bandage (dressing) system for the foot and leg. The dressing is a gauze wrap that is soaked with a type of medicine (zinc oxide) to treat foot, heel, or leg ulcers, swelling from disorders that affect the veins or lymphatic system (lymphedema), and skin conditions caused by poor blood flow (stasis dermatitis). This dressing is applied by a health care provider. After it is applied, the boot will dry and harden. The boot may need to be changed or replaced about twice a week. Let your health care provider know if you have any signs of poor blood flow or infection. This information is not intended to replace advice given to you by your health care provider. Make sure you discuss any questions you have with your health care provider. Document Revised: 04/03/2021 Document Reviewed: 04/03/2021 Elsevier Patient Education  [place].

## 2022-04-02 ENCOUNTER — Other Ambulatory Visit: Payer: Medicare Other

## 2022-04-02 ENCOUNTER — Ambulatory Visit (INDEPENDENT_AMBULATORY_CARE_PROVIDER_SITE_OTHER): Payer: Medicare Other | Admitting: *Deleted

## 2022-04-02 DIAGNOSIS — E782 Mixed hyperlipidemia: Secondary | ICD-10-CM | POA: Diagnosis not present

## 2022-04-02 DIAGNOSIS — E538 Deficiency of other specified B group vitamins: Secondary | ICD-10-CM | POA: Diagnosis not present

## 2022-04-02 DIAGNOSIS — I10 Essential (primary) hypertension: Secondary | ICD-10-CM | POA: Diagnosis not present

## 2022-04-02 LAB — CBC WITH DIFFERENTIAL/PLATELET
Basophils Absolute: 0.1 10*3/uL (ref 0.0–0.2)
Basos: 1 %
EOS (ABSOLUTE): 0.1 10*3/uL (ref 0.0–0.4)
Eos: 2 %
Hematocrit: 42 % (ref 34.0–46.6)
Hemoglobin: 14 g/dL (ref 11.1–15.9)
Immature Grans (Abs): 0 10*3/uL (ref 0.0–0.1)
Immature Granulocytes: 0 %
Lymphocytes Absolute: 1.7 10*3/uL (ref 0.7–3.1)
Lymphs: 29 %
MCH: 30.1 pg (ref 26.6–33.0)
MCHC: 33.3 g/dL (ref 31.5–35.7)
MCV: 90 fL (ref 79–97)
Monocytes Absolute: 0.3 10*3/uL (ref 0.1–0.9)
Monocytes: 6 %
Neutrophils Absolute: 3.6 10*3/uL (ref 1.4–7.0)
Neutrophils: 62 %
Platelets: 158 10*3/uL (ref 150–450)
RBC: 4.65 x10E6/uL (ref 3.77–5.28)
RDW: 12.9 % (ref 11.7–15.4)
WBC: 5.8 10*3/uL (ref 3.4–10.8)

## 2022-04-02 LAB — CMP14+EGFR
ALT: 19 IU/L (ref 0–32)
AST: 22 IU/L (ref 0–40)
Albumin/Globulin Ratio: 1.6 (ref 1.2–2.2)
Albumin: 4 g/dL (ref 3.8–4.8)
Alkaline Phosphatase: 129 IU/L — ABNORMAL HIGH (ref 44–121)
BUN/Creatinine Ratio: 16 (ref 12–28)
BUN: 18 mg/dL (ref 8–27)
Bilirubin Total: 0.5 mg/dL (ref 0.0–1.2)
CO2: 24 mmol/L (ref 20–29)
Calcium: 9.1 mg/dL (ref 8.7–10.3)
Chloride: 106 mmol/L (ref 96–106)
Creatinine, Ser: 1.12 mg/dL — ABNORMAL HIGH (ref 0.57–1.00)
Globulin, Total: 2.5 g/dL (ref 1.5–4.5)
Glucose: 86 mg/dL (ref 70–99)
Potassium: 4.4 mmol/L (ref 3.5–5.2)
Sodium: 142 mmol/L (ref 134–144)
Total Protein: 6.5 g/dL (ref 6.0–8.5)
eGFR: 52 mL/min/{1.73_m2} — ABNORMAL LOW (ref >59)

## 2022-04-02 LAB — LIPID PANEL
Chol/HDL Ratio: 3.6 ratio (ref 0.0–4.4)
Cholesterol, Total: 126 mg/dL (ref 100–199)
HDL: 35 mg/dL — ABNORMAL LOW (ref >39)
LDL Chol Calc (NIH): 58 mg/dL (ref 0–99)
Triglycerides: 201 mg/dL — ABNORMAL HIGH (ref 0–149)
VLDL Cholesterol Cal: 33 mg/dL (ref 5–40)

## 2022-04-04 DIAGNOSIS — Z23 Encounter for immunization: Secondary | ICD-10-CM | POA: Diagnosis not present

## 2022-04-13 DIAGNOSIS — I83891 Varicose veins of right lower extremities with other complications: Secondary | ICD-10-CM | POA: Diagnosis not present

## 2022-04-13 DIAGNOSIS — I872 Venous insufficiency (chronic) (peripheral): Secondary | ICD-10-CM | POA: Diagnosis not present

## 2022-04-29 DIAGNOSIS — I872 Venous insufficiency (chronic) (peripheral): Secondary | ICD-10-CM | POA: Diagnosis not present

## 2022-04-29 DIAGNOSIS — Z6837 Body mass index (BMI) 37.0-37.9, adult: Secondary | ICD-10-CM | POA: Diagnosis not present

## 2022-04-29 DIAGNOSIS — Z8673 Personal history of transient ischemic attack (TIA), and cerebral infarction without residual deficits: Secondary | ICD-10-CM | POA: Diagnosis not present

## 2022-04-29 DIAGNOSIS — I10 Essential (primary) hypertension: Secondary | ICD-10-CM | POA: Diagnosis not present

## 2022-04-29 DIAGNOSIS — Z0000 Encounter for general adult medical examination without abnormal findings: Secondary | ICD-10-CM | POA: Diagnosis not present

## 2022-04-29 DIAGNOSIS — I83891 Varicose veins of right lower extremities with other complications: Secondary | ICD-10-CM | POA: Diagnosis not present

## 2022-05-04 ENCOUNTER — Ambulatory Visit (INDEPENDENT_AMBULATORY_CARE_PROVIDER_SITE_OTHER): Payer: Medicare Other | Admitting: *Deleted

## 2022-05-04 DIAGNOSIS — E538 Deficiency of other specified B group vitamins: Secondary | ICD-10-CM | POA: Diagnosis not present

## 2022-05-04 NOTE — Progress Notes (Signed)
B12 injection given and patient tolerated well.

## 2022-05-05 ENCOUNTER — Encounter: Payer: Self-pay | Admitting: Nurse Practitioner

## 2022-05-05 ENCOUNTER — Ambulatory Visit (INDEPENDENT_AMBULATORY_CARE_PROVIDER_SITE_OTHER): Payer: Medicare Other | Admitting: Nurse Practitioner

## 2022-05-05 VITALS — BP 138/67 | HR 65 | Temp 98.7°F | Ht 66.0 in | Wt 214.4 lb

## 2022-05-05 DIAGNOSIS — J0100 Acute maxillary sinusitis, unspecified: Secondary | ICD-10-CM

## 2022-05-05 MED ORDER — AMOXICILLIN-POT CLAVULANATE 875-125 MG PO TABS: 1.0000 | ORAL_TABLET | Freq: Two times a day (BID) | ORAL | 0 refills | Status: DC

## 2022-05-05 MED ORDER — BENZONATATE 200 MG PO CAPS: 200.0000 mg | ORAL_CAPSULE | Freq: Three times a day (TID) | ORAL | 2 refills | Status: DC | PRN

## 2022-05-05 NOTE — Addendum Note (Signed)
Addended by: Chevis Pretty on: 05/05/2022 12:12 PM   Modules accepted: Orders

## 2022-05-05 NOTE — Patient Instructions (Signed)
1. Take meds as prescribed 2. Use a cool mist humidifier especially during the winter months and when heat has been humid. 3. Use saline nose sprays frequently 4. Saline irrigations of the nose can be very helpful if done frequently.  * 4X daily for 1 week*  * Use of a nettie pot can be helpful with this. Follow directions with this* 5. Drink plenty of fluids 6. Keep thermostat turn down low 7.For any cough or congestion- mucinex OTC 8. For fever or aces or pains- take tylenol or ibuprofen appropriate for age and weight.  * for fevers greater than 101 orally you may alternate ibuprofen and tylenol every  3 hours.

## 2022-05-05 NOTE — Progress Notes (Signed)
Subjective:    Patient ID: Kimberly Walsh, female    DOB: 04/20/1946, 76 y.o.   MRN: [id number]   Chief Complaint: URI  URI  This is a new problem. The current episode started 1 to 4 weeks ago. The problem has been waxing and waning. There has been no fever. Associated symptoms include congestion, coughing and rhinorrhea. Pertinent negatives include no headaches, sneezing or swollen glands. She has tried acetaminophen for the symptoms. The treatment provided mild relief.       Review of Systems  Constitutional:  Negative for chills, fatigue and fever.  HENT:  Positive for congestion and rhinorrhea. Negative for sneezing.   Respiratory:  Positive for cough.   Neurological:  Negative for headaches.       Objective:   Physical Exam Vitals reviewed.  Constitutional:      Appearance: Normal appearance.  HENT:     Right Ear: Tympanic membrane normal.     Left Ear: Tympanic membrane normal.     Nose: Congestion and rhinorrhea present.  Cardiovascular:     Rate and Rhythm: Normal rate and regular rhythm.     Heart sounds: Normal heart sounds.  Pulmonary:     Effort: Pulmonary effort is normal.     Breath sounds: Normal breath sounds.  Skin:    General: Skin is warm.  Neurological:     General: No focal deficit present.     Mental Status: She is alert and oriented to person, place, and time.  Psychiatric:        Mood and Affect: Mood normal.        Behavior: Behavior normal.    BP 138/67   Pulse 65   Temp 98.7 F (37.1 C)   Ht '5\' 6"'$  (1.676 m)   Wt 214 lb 6.4 oz (97.3 kg)   SpO2 99%   BMI 34.61 kg/m         Assessment & Plan:   [name] in today with chief complaint of URI   1. Acute non-recurrent maxillary sinusitis 1. Take meds as prescribed 2. Use a cool mist humidifier especially during the winter months and when heat has been humid. 3. Use saline nose sprays frequently 4. Saline irrigations of the nose can be very helpful if done  frequently.  * 4X daily for 1 week*  * Use of a nettie pot can be helpful with this. Follow directions with this* 5. Drink plenty of fluids 6. Keep thermostat turn down low 7.For any cough or congestion- mucinex OTC 8. For fever or aces or pains- take tylenol or ibuprofen appropriate for age and weight.  * for fevers greater than 101 orally you may alternate ibuprofen and tylenol every  3 hours.    Meds ordered this encounter  Medications   amoxicillin-clavulanate (AUGMENTIN) 875-125 MG tablet    Sig: Take 1 tablet by mouth 2 (two) times daily.    Dispense:  14 tablet    Refill:  0    Order Specific Question:   Supervising Provider    Answer:   Caryl Pina A [[id number]]      The above assessment and management plan was discussed with the patient. The patient verbalized understanding of and has agreed to the management plan. Patient is aware to call the clinic if symptoms persist or worsen. Patient is aware when to return to the clinic for a follow-up visit. Patient educated on when it is appropriate to go to the emergency department.  [name], FNP   

## 2022-05-06 DIAGNOSIS — I83891 Varicose veins of right lower extremities with other complications: Secondary | ICD-10-CM | POA: Diagnosis not present

## 2022-05-06 DIAGNOSIS — I872 Venous insufficiency (chronic) (peripheral): Secondary | ICD-10-CM | POA: Diagnosis not present

## 2022-05-21 DIAGNOSIS — M170 Bilateral primary osteoarthritis of knee: Secondary | ICD-10-CM | POA: Diagnosis not present

## 2022-05-28 DIAGNOSIS — M1712 Unilateral primary osteoarthritis, left knee: Secondary | ICD-10-CM | POA: Diagnosis not present

## 2022-05-28 DIAGNOSIS — M1711 Unilateral primary osteoarthritis, right knee: Secondary | ICD-10-CM | POA: Diagnosis not present

## 2022-05-28 DIAGNOSIS — M170 Bilateral primary osteoarthritis of knee: Secondary | ICD-10-CM | POA: Diagnosis not present

## 2022-06-04 ENCOUNTER — Other Ambulatory Visit: Payer: Self-pay | Admitting: Nurse Practitioner

## 2022-06-04 ENCOUNTER — Ambulatory Visit: Payer: Medicare Other

## 2022-06-04 DIAGNOSIS — R42 Dizziness and giddiness: Secondary | ICD-10-CM

## 2022-06-04 DIAGNOSIS — M170 Bilateral primary osteoarthritis of knee: Secondary | ICD-10-CM | POA: Diagnosis not present

## 2022-06-04 DIAGNOSIS — M1712 Unilateral primary osteoarthritis, left knee: Secondary | ICD-10-CM | POA: Diagnosis not present

## 2022-06-04 DIAGNOSIS — M1711 Unilateral primary osteoarthritis, right knee: Secondary | ICD-10-CM | POA: Diagnosis not present

## 2022-06-04 MED ORDER — MECLIZINE HCL 25 MG PO TABS: ORAL_TABLET | ORAL | 1 refills | Status: DC

## 2022-06-08 ENCOUNTER — Ambulatory Visit (INDEPENDENT_AMBULATORY_CARE_PROVIDER_SITE_OTHER): Payer: Medicare Other

## 2022-06-08 DIAGNOSIS — E538 Deficiency of other specified B group vitamins: Secondary | ICD-10-CM

## 2022-06-09 ENCOUNTER — Ambulatory Visit: Payer: Medicare Other

## 2022-06-11 DIAGNOSIS — I872 Venous insufficiency (chronic) (peripheral): Secondary | ICD-10-CM | POA: Diagnosis not present

## 2022-06-11 DIAGNOSIS — I83892 Varicose veins of left lower extremities with other complications: Secondary | ICD-10-CM | POA: Diagnosis not present

## 2022-06-11 DIAGNOSIS — I83891 Varicose veins of right lower extremities with other complications: Secondary | ICD-10-CM | POA: Diagnosis not present

## 2022-06-24 ENCOUNTER — Ambulatory Visit (INDEPENDENT_AMBULATORY_CARE_PROVIDER_SITE_OTHER): Payer: Medicare Other

## 2022-06-24 VITALS — Ht 66.0 in | Wt 210.0 lb

## 2022-06-24 DIAGNOSIS — Z780 Asymptomatic menopausal state: Secondary | ICD-10-CM

## 2022-06-24 DIAGNOSIS — Z0000 Encounter for general adult medical examination without abnormal findings: Secondary | ICD-10-CM

## 2022-06-24 NOTE — Patient Instructions (Signed)
Kimberly Walsh , Thank you for taking time to come for your Medicare Wellness Visit. I appreciate your ongoing commitment to your health goals. Please review the following plan we discussed and let me know if I can assist you in the future.   These are the goals we discussed:  Goals      DIET - INCREASE WATER INTAKE     Try to drink 6-8 glasses of water daily     Exercise 3x per week (30 min per time)        This is a list of the screening recommended for you and due dates:  Health Maintenance  Topic Date Due   Pap Smear  05/09/2014   COVID-19 Vaccine (3 - Moderna risk series) 07/29/2020   Zoster (Shingles) Vaccine (2 of 2) 10/30/2021   Flu Shot  09/20/2022*   Cologuard (Stool DNA test)  03/11/2023*   DEXA scan (bone density measurement)  07/28/2022   Medicare Annual Wellness Visit  06/25/2023   DTaP/Tdap/Td vaccine (3 - Td or Tdap) 09/05/2031   Pneumonia Vaccine  Completed   Hepatitis C Screening: USPSTF Recommendation to screen - Ages 101-79 yo.  Completed   HPV Vaccine  Aged Out  *Topic was postponed. The date shown is not the original due date.    Advanced directives: Please bring a copy of your health care power of attorney and living will to the office to be added to your chart at your convenience.   Conditions/risks identified: Aim for 30 minutes of exercise or brisk walking, 6-8 glasses of water, and 5 servings of fruits and vegetables each day.   Next appointment: Follow up in one year for your annual wellness visit    Preventive Care 65 Years and Older, Female Preventive care refers to lifestyle choices and visits with your health care provider that can promote health and wellness. What does preventive care include? A yearly physical exam. This is also called an annual well check. Dental exams once or twice a year. Routine eye exams. Ask your health care provider how often you should have your eyes checked. Personal lifestyle choices, including: Daily care of your  teeth and gums. Regular physical activity. Eating a healthy diet. Avoiding tobacco and drug use. Limiting alcohol use. Practicing safe sex. Taking low-dose aspirin every day. Taking vitamin and mineral supplements as recommended by your health care provider. What happens during an annual well check? The services and screenings done by your health care provider during your annual well check will depend on your age, overall health, lifestyle risk factors, and family history of disease. Counseling  Your health care provider may ask you questions about your: Alcohol use. Tobacco use. Drug use. Emotional well-being. Home and relationship well-being. Sexual activity. Eating habits. History of falls. Memory and ability to understand (cognition). Work and work Statistician. Reproductive health. Screening  You may have the following tests or measurements: Height, weight, and BMI. Blood pressure. Lipid and cholesterol levels. These may be checked every 5 years, or more frequently if you are over 82 years old. Skin check. Lung cancer screening. You may have this screening every year starting at age 83 if you have a 30-pack-year history of smoking and currently smoke or have quit within the past 15 years. Fecal occult blood test (FOBT) of the stool. You may have this test every year starting at age 23. Flexible sigmoidoscopy or colonoscopy. You may have a sigmoidoscopy every 5 years or a colonoscopy every 10 years starting at age  50. Hepatitis C blood test. Hepatitis B blood test. Sexually transmitted disease (STD) testing. Diabetes screening. This is done by checking your blood sugar (glucose) after you have not eaten for a while (fasting). You may have this done every 1-3 years. Bone density scan. This is done to screen for osteoporosis. You may have this done starting at age 9. Mammogram. This may be done every 1-2 years. Talk to your health care provider about how often you should have  regular mammograms. Talk with your health care provider about your test results, treatment options, and if necessary, the need for more tests. Vaccines  Your health care provider may recommend certain vaccines, such as: Influenza vaccine. This is recommended every year. Tetanus, diphtheria, and acellular pertussis (Tdap, Td) vaccine. You may need a Td booster every 10 years. Zoster vaccine. You may need this after age 8. Pneumococcal 13-valent conjugate (PCV13) vaccine. One dose is recommended after age 30. Pneumococcal polysaccharide (PPSV23) vaccine. One dose is recommended after age 69. Talk to your health care provider about which screenings and vaccines you need and how often you need them. This information is not intended to replace advice given to you by your health care provider. Make sure you discuss any questions you have with your health care provider. Document Released: 07/05/2015 Document Revised: 02/26/2016 Document Reviewed: 04/09/2015 Elsevier Interactive Patient Education  2017 [place] Prevention in the Home Falls can cause injuries. They can happen to people of all ages. There are many things you can do to make your home safe and to help prevent falls. What can I do on the outside of my home? Regularly fix the edges of walkways and driveways and fix any cracks. Remove anything that might make you trip as you walk through a door, such as a raised step or threshold. Trim any bushes or trees on the path to your home. Use bright outdoor lighting. Clear any walking paths of anything that might make someone trip, such as rocks or tools. Regularly check to see if handrails are loose or broken. Make sure that both sides of any steps have handrails. Any raised decks and porches should have guardrails on the edges. Have any leaves, snow, or ice cleared regularly. Use sand or salt on walking paths during winter. Clean up any spills in your garage right away. This  includes oil or grease spills. What can I do in the bathroom? Use night lights. Install grab bars by the toilet and in the tub and shower. Do not use towel bars as grab bars. Use non-skid mats or decals in the tub or shower. If you need to sit down in the shower, use a plastic, non-slip stool. Keep the floor dry. Clean up any water that spills on the floor as soon as it happens. Remove soap buildup in the tub or shower regularly. Attach bath mats securely with double-sided non-slip rug tape. Do not have throw rugs and other things on the floor that can make you trip. What can I do in the bedroom? Use night lights. Make sure that you have a light by your bed that is easy to reach. Do not use any sheets or blankets that are too big for your bed. They should not hang down onto the floor. Have a firm chair that has side arms. You can use this for support while you get dressed. Do not have throw rugs and other things on the floor that can make you trip. What can I do  in the kitchen? Clean up any spills right away. Avoid walking on wet floors. Keep items that you use a lot in easy-to-reach places. If you need to reach something above you, use a strong step stool that has a grab bar. Keep electrical cords out of the way. Do not use floor polish or wax that makes floors slippery. If you must use wax, use non-skid floor wax. Do not have throw rugs and other things on the floor that can make you trip. What can I do with my stairs? Do not leave any items on the stairs. Make sure that there are handrails on both sides of the stairs and use them. Fix handrails that are broken or loose. Make sure that handrails are as long as the stairways. Check any carpeting to make sure that it is firmly attached to the stairs. Fix any carpet that is loose or worn. Avoid having throw rugs at the top or bottom of the stairs. If you do have throw rugs, attach them to the floor with carpet tape. Make sure that you  have a light switch at the top of the stairs and the bottom of the stairs. If you do not have them, ask someone to add them for you. What else can I do to help prevent falls? Wear shoes that: Do not have high heels. Have rubber bottoms. Are comfortable and fit you well. Are closed at the toe. Do not wear sandals. If you use a stepladder: Make sure that it is fully opened. Do not climb a closed stepladder. Make sure that both sides of the stepladder are locked into place. Ask someone to hold it for you, if possible. Clearly mark and make sure that you can see: Any grab bars or handrails. First and last steps. Where the edge of each step is. Use tools that help you move around (mobility aids) if they are needed. These include: Canes. Walkers. Scooters. Crutches. Turn on the lights when you go into a dark area. Replace any light bulbs as soon as they burn out. Set up your furniture so you have a clear path. Avoid moving your furniture around. If any of your floors are uneven, fix them. If there are any pets around you, be aware of where they are. Review your medicines with your doctor. Some medicines can make you feel dizzy. This can increase your chance of falling. Ask your doctor what other things that you can do to help prevent falls. This information is not intended to replace advice given to you by your health care provider. Make sure you discuss any questions you have with your health care provider. Document Released: 04/04/2009 Document Revised: 11/14/2015 Document Reviewed: 07/13/2014 Elsevier Interactive Patient Education  2017 [employer].

## 2022-06-24 NOTE — Progress Notes (Signed)
Subjective:   Kimberly Walsh is a 77 y.o. female who presents for Medicare Annual (Subsequent) preventive examination. I connected with  [name] on 06/24/22 by a audio enabled telemedicine application and verified that I am speaking with the correct person using two identifiers.  Patient Location: Home  Provider Location: Home Office  I discussed the limitations of evaluation and management by telemedicine. The patient expressed understanding and agreed to proceed.  Review of Systems     Cardiac Risk Factors include: advanced age (>73mn, >>72women)     Objective:    Today's Vitals   06/24/22 1029  Weight: 210 lb (95.3 kg)  Height: _0  (1.676 m)   Body mass index is 33.89 kg/m.     06/24/2022   10:33 AM 04/21/2021   10:41 AM 06/09/2019    9:37 AM  Advanced Directives  Does Patient Have a Medical Advance Directive? Yes Yes No  Type of AParamedicof [place] College[place] will [place] will   Copy of HGrayin Chart? No - copy requested No - copy requested   Would patient like information on creating a medical advance directive?  No - Patient declined No - Patient declined    Current Medications (verified) Outpatient Encounter Medications as of 06/24/2022  Medication Sig   amoxicillin-clavulanate (AUGMENTIN) 875-125 MG tablet Take 1 tablet by mouth 2 (two) times daily.   atorvastatin (LIPITOR) 40 MG tablet Take 1 tablet (40 mg total) by mouth daily.   benzonatate (TESSALON) 200 MG capsule Take 1 capsule (200 mg total) by mouth 3 (three) times daily as needed for cough.   clopidogrel (PLAVIX) 75 MG tablet Take 1 tablet (75 mg total) by mouth daily.   cyclobenzaprine (FLEXERIL) 10 MG tablet Take 1 tablet by mouth three times daily as needed for muscle spasm   furosemide (LASIX) 40 MG tablet Take 1 tablet (40 mg total) by mouth daily.   levothyroxine (SYNTHROID) 50 MCG tablet Take 1 tablet (50 mcg  total) by mouth daily.   losartan (COZAAR) 25 MG tablet Take 1 tablet (25 mg total) by mouth daily.   meclizine (ANTIVERT) 25 MG tablet Take 1 tablet by mouth every 6 hours as need for vertigo   Facility-Administered Encounter Medications as of 06/24/2022  Medication   cyanocobalamin (VITAMIN B12) injection 1,000 mcg    Allergies (verified) Ace inhibitors   History: Past Medical History:  Diagnosis Date   Hyperlipidemia    Venous stasis    Vertigo    Past Surgical History:  Procedure Laterality Date   basal carcinoma rt leg     CHOLECYSTECTOMY     FINGER SURGERY Right    Ring finger   MIDDLE EAR SURGERY     Fungus   TUBAL LIGATION     VEIN SURGERY     Family History  Problem Relation Age of Onset   COPD Father    Social History   Socioeconomic History   Marital status: Widowed    Spouse name: Not on file   Number of children: 3   Years of education: 12   Highest education level: High school graduate  Occupational History    Employer: [employer]    Comment: part time  Tobacco Use   Smoking status: Never   Smokeless tobacco: Never  Vaping Use   Vaping Use: Never used  Substance and Sexual Activity   Alcohol use: No   Drug use: No  Sexual activity: Not Currently  Other Topics Concern   Not on file  Social History Narrative   Retired Science writer   3 children-2 daughters and 1 son   10 grandchildren   Widowed since 04/22/2019.   Social Determinants of Health   Financial Resource Strain: Low Risk  (06/24/2022)   Overall Financial Resource Strain (CARDIA)    Difficulty of Paying Living Expenses: Not hard at all  Food Insecurity: No Food Insecurity (06/24/2022)   Hunger Vital Sign    Worried About Running Out of Food in the Last Year: Never true    Ran Out of Food in the Last Year: Never true  Transportation Needs: No Transportation Needs (06/24/2022)   PRAPARE - Hydrologist (Medical): No    Lack of Transportation (Non-Medical): No   Physical Activity: Insufficiently Active (06/24/2022)   Exercise Vital Sign    Days of Exercise per Week: 3 days    Minutes of Exercise per Session: 30 min  Stress: No Stress Concern Present (06/24/2022)   [place]    Feeling of Stress : Not at all  Social Connections: Moderately Integrated (06/24/2022)   Social Connection and Isolation Panel [NHANES]    Frequency of Communication with Friends and Family: More than three times a week    Frequency of Social Gatherings with Friends and Family: More than three times a week    Attends Religious Services: More than 4 times per year    Active Member of [employer] or Organizations: Yes    Attends Archivist Meetings: More than 4 times per year    Marital Status: Widowed    Tobacco Counseling Counseling given: Not Answered   Clinical Intake:  Pre-visit preparation completed: Yes  Pain : No/denies pain     Nutritional Risks: None Diabetes: No  How often do you need to have someone help you when you read instructions, pamphlets, or other written materials from your doctor or pharmacy?: 1 - Never  Diabetic?no   Interpreter Needed?: No  Information entered by :: Jadene Pierini, LPN   Activities of Daily Living    06/24/2022   10:33 AM  In your present state of health, do you have any difficulty performing the following activities:  Hearing? 0  Vision? 0  Difficulty concentrating or making decisions? 0  Walking or climbing stairs? 0  Dressing or bathing? 0  Doing errands, shopping? 0  Preparing Food and eating ? N  Using the Toilet? N  In the past six months, have you accidently leaked urine? N  Do you have problems with loss of bowel control? N  Managing your Medications? N  Managing your Finances? N  Housekeeping or managing your Housekeeping? N    Patient Care Team: Chevis Pretty, FNP as PCP - General (Family Medicine)  Indicate any recent  Medical Services you may have received from other than [facility] providers in the past year (date may be approximate).     Assessment:   This is a routine wellness examination for Kimberly Walsh.  Hearing/Vision screen Vision Screening - Comments:: Wears rx glasses - up to date with routine eye exams with  Dr.Lee  Dietary issues and exercise activities discussed: Current Exercise Habits: Home exercise routine, Type of exercise: walking, Time (Minutes): 30, Frequency (Times/Week): 3, Weekly Exercise (Minutes/Week): 90, Intensity: Mild, Exercise limited by: orthopedic condition(s)   Goals Addressed  This Visit's Progress    DIET - INCREASE WATER INTAKE   On track    Try to drink 6-8 glasses of water daily       Depression Screen    06/24/2022   10:31 AM 03/10/2022   12:20 PM 09/04/2021   12:13 PM 06/02/2021   12:36 PM 05/12/2021   12:24 PM 04/21/2021   10:38 AM 04/14/2021   12:16 PM  PHQ 2/9 Scores  PHQ - 2 Score 0 0 0 0 0 0 0  PHQ- 9 Score  0 0        Fall Risk    06/24/2022   10:30 AM 03/10/2022   12:20 PM 09/04/2021   12:13 PM 06/02/2021   12:36 PM 05/12/2021   12:24 PM  [place] in the past year? 0 0 0 0 0  Number falls in past yr: 0      Injury with Fall? 0      Risk for fall due to : No Fall Risks      Follow up Falls prevention discussed        FALL RISK PREVENTION PERTAINING TO THE HOME:  Any stairs in or around the home? Yes  If so, are there any without handrails? No  Home free of loose throw rugs in walkways, pet beds, electrical cords, etc? Yes  Adequate lighting in your home to reduce risk of falls? Yes   ASSISTIVE DEVICES UTILIZED TO PREVENT FALLS:  Life alert? No  Use of a cane, walker or w/c? No  Grab bars in the bathroom? No  Shower chair or bench in shower? No  Elevated toilet seat or a handicapped toilet? No        06/24/2022   10:33 AM 04/21/2021   10:45 AM 06/09/2019    9:43 AM  6CIT Screen  What Year? 0 points 0 points 0  points  What month? 0 points 0 points 0 points  What time? 0 points 0 points 0 points  Count back from 20 0 points 0 points 0 points  Months in reverse 0 points 0 points 0 points  Repeat phrase 0 points 0 points 2 points  Total Score 0 points 0 points 2 points    Immunizations Immunization History  Administered Date(s) Administered   Fluad Quad(high Dose 65+) 03/30/2020   Hepatitis B 09/25/1998, 10/31/1998, 04/03/1999   Hepatitis B, PED/ADOLESCENT 09/25/1998, 10/31/1998, 04/03/1999   Influenza Whole 03/10/2012   Influenza, High Dose Seasonal PF 04/02/2013, 04/03/2017, 02/22/2018, 03/09/2019   Influenza,inj,Quad PF,6+ Mos 05/05/2016   Influenza-Unspecified 03/31/2014, 03/31/2015, 05/05/2016, 03/09/2019, 03/29/2021   MMR 10/22/2011   Moderna SARS-COV2 Booster Vaccination 07/01/2020   Moderna Sars-Covid-2 Vaccination 07/31/2019, 08/27/2019   Pneumococcal Conjugate-13 05/09/2013   Pneumococcal Polysaccharide-23 09/28/2016, 05/04/2019   Tdap 04/09/2011, 09/04/2021   Zoster Recombinat (Shingrix) 09/04/2021   Zoster, Live 05/23/2013    TDAP status: Up to date  Flu Vaccine status: Up to date  Pneumococcal vaccine status: Up to date  Covid-19 vaccine status: Completed vaccines  Qualifies for Shingles Vaccine? Yes   Zostavax completed Yes   Shingrix Completed?: Yes  Screening Tests Health Maintenance  Topic Date Due   PAP SMEAR-Modifier  05/09/2014   COVID-19 Vaccine (3 - Moderna risk series) 07/29/2020   Zoster Vaccines- Shingrix (2 of 2) 10/30/2021   INFLUENZA VACCINE  09/20/2022 (Originally 01/20/2022)   Fecal DNA (Cologuard)  03/11/2023 (Originally 04/17/1992)   DEXA SCAN  07/28/2022   Medicare Annual Wellness (AWV)  06/25/2023  DTaP/Tdap/Td (3 - Td or Tdap) 09/05/2031   Pneumonia Vaccine 66+ Years old  Completed   Hepatitis C Screening  Completed   HPV VACCINES  Aged Out    Health Maintenance  Health Maintenance Due  Topic Date Due   PAP SMEAR-Modifier   05/09/2014   COVID-19 Vaccine (3 - Moderna risk series) 07/29/2020   Zoster Vaccines- Shingrix (2 of 2) 10/30/2021    Colorectal cancer screening: No longer required.   Mammogram status: No longer required due to age.  Bone Density status: Ordered 06/24/2022. Pt provided with contact info and advised to call to schedule appt.  Lung Cancer Screening: (Low Dose CT Chest recommended if Age 87-80 years, 30 pack-year currently smoking OR have quit w/in 15years.) does not qualify.   Lung Cancer Screening Referral: n/a  Additional Screening:  Hepatitis C Screening: does not qualify;   Vision Screening: Recommended annual ophthalmology exams for early detection of glaucoma and other disorders of the eye. Is the patient up to date with their annual eye exam?  Yes  Who is the provider or what is the name of the office in which the patient attends annual eye exams? Dr.Lee  If pt is not established with a provider, would they like to be referred to a provider to establish care? No .   Dental Screening: Recommended annual dental exams for proper oral hygiene  Community Resource Referral / Chronic Care Management: CRR required this visit?  No   CCM required this visit?  No      Plan:     I have personally reviewed and noted the following in the patient's chart:   Medical and social history Use of alcohol, tobacco or illicit drugs  Current medications and supplements including opioid prescriptions. Patient is not currently taking opioid prescriptions. Functional ability and status Nutritional status Physical activity Advanced directives List of other physicians Hospitalizations, surgeries, and ER visits in previous 12 months Vitals Screenings to include cognitive, depression, and falls Referrals and appointments  In addition, I have reviewed and discussed with patient certain preventive protocols, quality metrics, and best practice recommendations. A written personalized care plan  for preventive services as well as general preventive health recommendations were provided to patient.     Daphane Shepherd, LPN   12/27/6752   Nurse Notes: Referral Dexa 06/24/2022

## 2022-07-13 ENCOUNTER — Ambulatory Visit (INDEPENDENT_AMBULATORY_CARE_PROVIDER_SITE_OTHER): Payer: Medicare Other | Admitting: *Deleted

## 2022-07-13 DIAGNOSIS — E538 Deficiency of other specified B group vitamins: Secondary | ICD-10-CM | POA: Diagnosis not present

## 2022-07-13 NOTE — Progress Notes (Signed)
Vitamin b12 injection given and patient tolerated well.  

## 2022-08-04 DIAGNOSIS — Z79899 Other long term (current) drug therapy: Secondary | ICD-10-CM | POA: Diagnosis not present

## 2022-08-04 DIAGNOSIS — I3481 Nonrheumatic mitral (valve) annulus calcification: Secondary | ICD-10-CM | POA: Diagnosis not present

## 2022-08-04 DIAGNOSIS — M5481 Occipital neuralgia: Secondary | ICD-10-CM | POA: Diagnosis not present

## 2022-08-04 DIAGNOSIS — I639 Cerebral infarction, unspecified: Secondary | ICD-10-CM | POA: Diagnosis not present

## 2022-08-04 DIAGNOSIS — I6523 Occlusion and stenosis of bilateral carotid arteries: Secondary | ICD-10-CM | POA: Diagnosis not present

## 2022-08-04 DIAGNOSIS — I10 Essential (primary) hypertension: Secondary | ICD-10-CM | POA: Diagnosis not present

## 2022-08-04 DIAGNOSIS — I672 Cerebral atherosclerosis: Secondary | ICD-10-CM | POA: Diagnosis not present

## 2022-08-04 DIAGNOSIS — M542 Cervicalgia: Secondary | ICD-10-CM | POA: Diagnosis not present

## 2022-08-04 DIAGNOSIS — Z7902 Long term (current) use of antithrombotics/antiplatelets: Secondary | ICD-10-CM | POA: Diagnosis not present

## 2022-08-17 ENCOUNTER — Ambulatory Visit (INDEPENDENT_AMBULATORY_CARE_PROVIDER_SITE_OTHER): Payer: Medicare Other

## 2022-08-17 DIAGNOSIS — E538 Deficiency of other specified B group vitamins: Secondary | ICD-10-CM | POA: Diagnosis not present

## 2022-08-17 NOTE — Progress Notes (Signed)
Patient came in for B12 injection - given in left upper deltoid - pt tolerated well

## 2022-09-14 ENCOUNTER — Ambulatory Visit (INDEPENDENT_AMBULATORY_CARE_PROVIDER_SITE_OTHER): Payer: Medicare Other | Admitting: *Deleted

## 2022-09-14 DIAGNOSIS — E538 Deficiency of other specified B group vitamins: Secondary | ICD-10-CM | POA: Diagnosis not present

## 2022-09-29 ENCOUNTER — Encounter: Payer: Self-pay | Admitting: Nurse Practitioner

## 2022-09-29 ENCOUNTER — Ambulatory Visit (INDEPENDENT_AMBULATORY_CARE_PROVIDER_SITE_OTHER): Payer: Medicare Other | Admitting: Nurse Practitioner

## 2022-09-29 VITALS — BP 130/64 | HR 80 | Ht 66.0 in | Wt 217.0 lb

## 2022-09-29 DIAGNOSIS — M549 Dorsalgia, unspecified: Secondary | ICD-10-CM

## 2022-09-29 DIAGNOSIS — H9191 Unspecified hearing loss, right ear: Secondary | ICD-10-CM

## 2022-09-29 DIAGNOSIS — R052 Subacute cough: Secondary | ICD-10-CM | POA: Diagnosis not present

## 2022-09-29 MED ORDER — CYCLOBENZAPRINE HCL 10 MG PO TABS: 10.0000 mg | ORAL_TABLET | Freq: Three times a day (TID) | ORAL | 1 refills | Status: DC | PRN

## 2022-09-29 MED ORDER — PREDNISONE 20 MG PO TABS: 40.0000 mg | ORAL_TABLET | Freq: Every day | ORAL | 0 refills | Status: AC

## 2022-09-29 NOTE — Patient Instructions (Signed)
Cough, Adult Coughing is a reflex that clears your throat and airways (respiratory system). It helps heal and protect your lungs. It is normal to cough from time to time. A cough that happens with other symptoms or that lasts a long time may be a sign of a condition that needs treatment. A short-term (acute) cough may only last 2-3 weeks. A long-term (chronic) cough may last 8 or more weeks. Coughing is often caused by: Diseases, such as: An infection of the respiratory system. Asthma or other heart or lung diseases. Gastroesophageal reflux. This is when acid comes back up from the stomach. Breathing in things that irritate your lungs. Allergies. Postnasal drip. This is when mucus runs down the back of your throat. Smoking. Some medicines. Follow these instructions at home: Medicines Take over-the-counter and prescription medicines only as told by your health care provider. Talk with your provider before you take cough medicine (cough suppressants). Eating and drinking Do not drink alcohol. Avoid caffeine. Drink enough fluid to keep your pee (urine) pale yellow. Lifestyle Avoid cigarette smoke. Do not use any products that contain nicotine or tobacco. These products include cigarettes, chewing tobacco, and vaping devices, such as e-cigarettes. If you need help quitting, ask your provider. Avoid things that make you cough. These may include perfumes, candles, cleaning products, or campfire smoke. General instructions  Watch for any changes to your cough. Tell your provider about them. Always cover your mouth when you cough. If the air is dry in your bedroom or home, use a cool mist vaporizer or humidifier. If your cough is worse at night, try to sleep in a semi-upright position. Rest as needed. Contact a health care provider if: You have new symptoms, or your symptoms get worse. You cough up pus. You have a fever that does not go away or a cough that does not get better after 2-3  weeks. You cannot control your cough with medicine, and you are losing sleep. You have pain that gets worse or is not helped with medicine. You lose weight for no clear reason. You have night sweats. Get help right away if: You cough up blood. You have trouble breathing. Your heart is beating very fast. These symptoms may be an emergency. Get help right away. Call 911. Do not wait to see if the symptoms will go away. Do not drive yourself to the hospital. This information is not intended to replace advice given to you by your health care provider. Make sure you discuss any questions you have with your health care provider. Document Revised: 02/06/2022 Document Reviewed: 02/06/2022 Elsevier Patient Education  2023 Elsevier Inc.  

## 2022-09-29 NOTE — Progress Notes (Signed)
Subjective:    Patient ID: ZALAYA GUSTER, female    DOB: [birth year], 77 y.o.   MRN: [id number]   Chief Complaint: Cough (Present for 3w. Dry, hacking cough.)  Patient come sin with 2 complaints:  Cough The current episode started 1 to 4 weeks ago. The problem has been waxing and waning. The cough is Productive of sputum. Associated symptoms include headaches and shortness of breath (occasionally). Pertinent negatives include no chills, fever, nasal congestion or rhinorrhea. Nothing aggravates the symptoms. Treatments tried: tessalon perles. The treatment provided mild relief.  Back Pain This is a new problem. The current episode started 1 to 4 weeks ago. The problem occurs intermittently. The problem has been waxing and waning since onset. The pain is present in the thoracic spine. The quality of the pain is described as aching. The pain does not radiate. The pain is at a severity of 7/10. The pain is moderate. The pain is Worse during the night. The symptoms are aggravated by lying down. Associated symptoms include headaches. Pertinent negatives include no fever. She has tried analgesics for the symptoms. The treatment provided mild relief.   Hearing loss Right hearing loss has been gradually going on for several months. Now can't hear out o right ear at all.  Patient Active Problem List   Diagnosis Date Noted   Completed stroke 09/22/2019   PVC (premature ventricular contraction) 09/22/2019   Late effect of cerebrovascular accident (CVA) 04/20/2019   CKD (chronic kidney disease) stage 3, GFR 30-59 ml/min 03/07/2018   Peripheral edema 09/15/2017   BMI 37.0-37.9, adult 09/28/2016   Mixed hyperlipidemia 05/05/2016   Essential hypertension, benign 03/01/2014   Hypothyroidism 03/01/2014   Osteoarthritis 04/25/2010       Review of Systems  Constitutional:  Negative for chills, fatigue and fever.  HENT:  Negative for rhinorrhea.   Respiratory:  Positive for cough and shortness of  breath (occasionally).   Musculoskeletal:  Positive for back pain.  Neurological:  Positive for headaches.       Objective:   Physical Exam Vitals and nursing note reviewed.  Constitutional:      Appearance: Normal appearance. She is obese.  HENT:     Right Ear: Tympanic membrane normal.     Left Ear: Tympanic membrane normal.     Nose: No congestion.     Mouth/Throat:     Pharynx: No oropharyngeal exudate or posterior oropharyngeal erythema.  Cardiovascular:     Rate and Rhythm: Normal rate and regular rhythm.     Heart sounds: Normal heart sounds.  Pulmonary:     Effort: Pulmonary effort is normal.     Breath sounds: Normal breath sounds. No wheezing, rhonchi or rales.  Chest:     Chest wall: No tenderness.  Skin:    General: Skin is warm.  Neurological:     General: No focal deficit present.     Mental Status: She is alert and oriented to person, place, and time.  Psychiatric:        Mood and Affect: Mood normal.        Behavior: Behavior normal.    BP 130/64   Pulse 80   Ht 5\' 6"  (1.676 m)   Wt 217 lb (98.4 kg)   SpO2 97%   BMI 35.02 kg/m         Assessment & Plan:   [name] in today with chief complaint of Cough (Present for 3w. Dry, hacking cough.)   1. Hearing loss  of right ear, unspecified hearing loss type - Ambulatory referral to Audiology  2. Upper back pain Moist heat rest - cyclobenzaprine (FLEXERIL) 10 MG tablet; Take 1 tablet (10 mg total) by mouth 3 (three) times daily as needed for muscle spasms.  Dispense: 30 tablet; Refill: 1  3. Subacute cough Force fluids Humidifier RTO prn - predniSONE (DELTASONE) 20 MG tablet; Take 2 tablets (40 mg total) by mouth daily with breakfast for 5 days. 2 po daily for 5 days  Dispense: 10 tablet; Refill: 0    The above assessment and management plan was discussed with the patient. The patient verbalized understanding of and has agreed to the management plan. Patient is aware to call the  clinic if symptoms persist or worsen. Patient is aware when to return to the clinic for a follow-up visit. Patient educated on when it is appropriate to go to the emergency department.   Mary-Margaret Daphine Deutscher, FNP

## 2022-10-16 DIAGNOSIS — M170 Bilateral primary osteoarthritis of knee: Secondary | ICD-10-CM | POA: Diagnosis not present

## 2022-10-19 ENCOUNTER — Ambulatory Visit: Payer: Medicare Other

## 2022-10-22 ENCOUNTER — Other Ambulatory Visit: Payer: Self-pay

## 2022-10-22 MED ORDER — SCOPOLAMINE 1 MG/3DAYS TD PT72: 1.0000 | MEDICATED_PATCH | TRANSDERMAL | 0 refills | Status: DC

## 2022-11-02 ENCOUNTER — Ambulatory Visit (INDEPENDENT_AMBULATORY_CARE_PROVIDER_SITE_OTHER): Payer: Medicare Other

## 2022-11-02 ENCOUNTER — Encounter: Payer: Self-pay | Admitting: Nurse Practitioner

## 2022-11-02 ENCOUNTER — Ambulatory Visit (INDEPENDENT_AMBULATORY_CARE_PROVIDER_SITE_OTHER): Payer: Medicare Other | Admitting: Nurse Practitioner

## 2022-11-02 VITALS — BP 123/72 | HR 71 | Ht 66.0 in | Wt 219.0 lb

## 2022-11-02 DIAGNOSIS — Z6837 Body mass index (BMI) 37.0-37.9, adult: Secondary | ICD-10-CM

## 2022-11-02 DIAGNOSIS — E034 Atrophy of thyroid (acquired): Secondary | ICD-10-CM | POA: Diagnosis not present

## 2022-11-02 DIAGNOSIS — E538 Deficiency of other specified B group vitamins: Secondary | ICD-10-CM

## 2022-11-02 DIAGNOSIS — R600 Localized edema: Secondary | ICD-10-CM | POA: Diagnosis not present

## 2022-11-02 DIAGNOSIS — E782 Mixed hyperlipidemia: Secondary | ICD-10-CM | POA: Diagnosis not present

## 2022-11-02 DIAGNOSIS — Z01818 Encounter for other preprocedural examination: Secondary | ICD-10-CM | POA: Diagnosis not present

## 2022-11-02 DIAGNOSIS — I6930 Unspecified sequelae of cerebral infarction: Secondary | ICD-10-CM | POA: Diagnosis not present

## 2022-11-02 DIAGNOSIS — I129 Hypertensive chronic kidney disease with stage 1 through stage 4 chronic kidney disease, or unspecified chronic kidney disease: Secondary | ICD-10-CM | POA: Diagnosis not present

## 2022-11-02 DIAGNOSIS — I10 Essential (primary) hypertension: Secondary | ICD-10-CM

## 2022-11-02 DIAGNOSIS — N1831 Chronic kidney disease, stage 3a: Secondary | ICD-10-CM

## 2022-11-02 LAB — CMP14+EGFR

## 2022-11-02 LAB — CBC WITH DIFFERENTIAL/PLATELET
Basophils Absolute: 0.1 10*3/uL (ref 0.0–0.2)
EOS (ABSOLUTE): 0.1 10*3/uL (ref 0.0–0.4)
Eos: 2 %
Hemoglobin: 14.9 g/dL (ref 11.1–15.9)
Immature Grans (Abs): 0 10*3/uL (ref 0.0–0.1)
MCHC: 33.7 g/dL (ref 31.5–35.7)
Platelets: 172 10*3/uL (ref 150–450)
RBC: 5.02 x10E6/uL (ref 3.77–5.28)
WBC: 6.5 10*3/uL (ref 3.4–10.8)

## 2022-11-02 LAB — LIPID PANEL

## 2022-11-02 LAB — THYROID PANEL WITH TSH

## 2022-11-02 MED ORDER — CLOPIDOGREL BISULFATE 75 MG PO TABS: 75.0000 mg | ORAL_TABLET | Freq: Every day | ORAL | 1 refills | Status: DC

## 2022-11-02 MED ORDER — FUROSEMIDE 40 MG PO TABS: 40.0000 mg | ORAL_TABLET | Freq: Every day | ORAL | 1 refills | Status: DC

## 2022-11-02 MED ORDER — LEVOTHYROXINE SODIUM 50 MCG PO TABS: 50.0000 ug | ORAL_TABLET | Freq: Every day | ORAL | 1 refills | Status: DC

## 2022-11-02 MED ORDER — SCOPOLAMINE 1 MG/3DAYS TD PT72: 1.0000 | MEDICATED_PATCH | TRANSDERMAL | 0 refills | Status: DC

## 2022-11-02 MED ORDER — ATORVASTATIN CALCIUM 40 MG PO TABS: 40.0000 mg | ORAL_TABLET | Freq: Every day | ORAL | 1 refills | Status: DC

## 2022-11-02 MED ORDER — LOSARTAN POTASSIUM 25 MG PO TABS: 25.0000 mg | ORAL_TABLET | Freq: Every day | ORAL | 1 refills | Status: DC

## 2022-11-02 NOTE — Progress Notes (Signed)
Subjective:    Patient ID: Kimberly Walsh, female    DOB: 13-Dec-1945, 77 y.o.   MRN: [id number]   Chief Complaint: medical management of chronic issues     HPI:  Kimberly Walsh is a 77 y.o. who identifies as a female who was assigned female at birth.   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 Essential hypertension, benign No c/o chest pain, sob or headache. Does not check blood pressure at home BP Readings from Last 3 Encounters:  09/29/22 130/64  05/05/22 138/67  03/10/22 120/63     2. Mixed hyperlipidemia Does not watch diet and does no dedicated exercise. Lab Results  Component Value Date   CHOL 126 04/02/2022   HDL 35 (L) 04/02/2022   LDLCALC 58 04/02/2022   TRIG 201 (H) 04/02/2022   CHOLHDL 3.6 04/02/2022     3. Late effect of cerebrovascular accident (CVA) No permanent effects  4. Hypothyroidism due to acquired atrophy of thyroid No issues that she is aware of. Lab Results  Component Value Date   TSH 2.720 11/29/2020     5. Stage 3a chronic kidney disease (HCC) No voiding issues Lab Results  Component Value Date   CREATININE 1.12 (H) 04/02/2022     6. Peripheral edema Has daily edema. Resolves some at night  7. BMI 37.0-37.9, adult No recent weight changes Wt Readings from Last 3 Encounters:  09/29/22 217 lb (98.4 kg)  06/24/22 210 lb (95.3 kg)  05/05/22 214 lb 6.4 oz (97.3 kg)   BMI Readings from Last 3 Encounters:  09/29/22 35.02 kg/m  06/24/22 33.89 kg/m  05/05/22 34.61 kg/m     8. Preoperative clearance Patient is going  to have a total knee replacement I July. Needs surgical clearance.    New complaints: Patient has vertigo. Is constant. Has been wearing scoplamine patches which help. She has also been taking meclizine.  Allergies  Allergen Reactions   Ace Inhibitors Cough   Outpatient Encounter  Medications as of 11/02/2022  Medication Sig   scopolamine (TRANSDERM-SCOP) 1 MG/3DAYS Place 1 patch (1.5 mg total) onto the skin every 3 (three) days.   atorvastatin (LIPITOR) 40 MG tablet Take 1 tablet (40 mg total) by mouth daily.   benzonatate (TESSALON) 200 MG capsule Take 1 capsule (200 mg total) by mouth 3 (three) times daily as needed for cough.   clopidogrel (PLAVIX) 75 MG tablet Take 1 tablet (75 mg total) by mouth daily.   cyclobenzaprine (FLEXERIL) 10 MG tablet Take 1 tablet (10 mg total) by mouth 3 (three) times daily as needed for muscle spasms.   furosemide (LASIX) 40 MG tablet Take 1 tablet (40 mg total) by mouth daily.   levothyroxine (SYNTHROID) 50 MCG tablet Take 1 tablet (50 mcg total) by mouth daily.   losartan (COZAAR) 25 MG tablet Take 1 tablet (25 mg total) by mouth daily.   meclizine (ANTIVERT) 25 MG tablet Take 1 tablet by mouth every 6 hours as need for vertigo   Facility-Administered Encounter Medications as of 11/02/2022  Medication   cyanocobalamin (VITAMIN B12) injection 1,000 mcg    Past Surgical History:  Procedure Laterality Date   basal carcinoma rt leg     CHOLECYSTECTOMY     FINGER SURGERY Right    Ring finger   MIDDLE EAR SURGERY     Fungus   TUBAL LIGATION  VEIN SURGERY      Family History  Problem Relation Age of Onset   COPD Father       Controlled substance contract: n/a     Review of Systems  Constitutional:  Negative for diaphoresis.  Eyes:  Negative for pain.  Respiratory:  Negative for shortness of breath.   Cardiovascular:  Negative for chest pain, palpitations and leg swelling.  Gastrointestinal:  Negative for abdominal pain.  Endocrine: Negative for polydipsia.  Skin:  Negative for rash.  Neurological:  Negative for dizziness, weakness and headaches.  Hematological:  Does not bruise/bleed easily.  All other systems reviewed and are negative.      Objective:   Physical Exam Vitals and nursing note reviewed.   Constitutional:      General: She is not in acute distress.    Appearance: Normal appearance. She is well-developed.  HENT:     Head: Normocephalic.     Right Ear: Tympanic membrane normal.     Left Ear: Tympanic membrane normal.     Nose: Nose normal.     Mouth/Throat:     Mouth: Mucous membranes are moist.  Eyes:     Pupils: Pupils are equal, round, and reactive to light.  Neck:     Vascular: No carotid bruit or JVD.  Cardiovascular:     Rate and Rhythm: Normal rate and regular rhythm.     Heart sounds: Normal heart sounds.  Pulmonary:     Effort: Pulmonary effort is normal. No respiratory distress.     Breath sounds: Normal breath sounds. No wheezing or rales.  Chest:     Chest wall: No tenderness.  Abdominal:     General: Bowel sounds are normal. There is no distension or abdominal bruit.     Palpations: Abdomen is soft. There is no hepatomegaly, splenomegaly, mass or pulsatile mass.     Tenderness: There is no abdominal tenderness.  Musculoskeletal:        General: Normal range of motion.     Cervical back: Normal range of motion and neck supple.  Lymphadenopathy:     Cervical: No cervical adenopathy.  Skin:    General: Skin is warm and dry.  Neurological:     Mental Status: She is alert and oriented to person, place, and time.     Deep Tendon Reflexes: Reflexes are normal and symmetric.  Psychiatric:        Behavior: Behavior normal.        Thought Content: Thought content normal.        Judgment: Judgment normal.     BP 123/72   Pulse 71   Ht 5\' 6"  (1.676 m)   Wt 219 lb (99.3 kg)   SpO2 98%   BMI 35.35 kg/m        Assessment & Plan:   Kimberly Walsh comes in today with chief complaint of Preoperative clearance   Diagnosis and orders addressed:  1. Essential hypertension, benign Low sodium diet - losartan (COZAAR) 25 MG tablet; Take 1 tablet (25 mg total) by mouth daily.  Dispense: 90 tablet; Refill: 1 - CBC with Differential/Platelet -  CMP14+EGFR  2. Mixed hyperlipidemia Low fat diet - atorvastatin (LIPITOR) 40 MG tablet; Take 1 tablet (40 mg total) by mouth daily.  Dispense: 90 tablet; Refill: 1 - Lipid panel  3. Late effect of cerebrovascular accident (CVA) - clopidogrel (PLAVIX) 75 MG tablet; Take 1 tablet (75 mg total) by mouth daily.  Dispense: 90 tablet; Refill: 1  4. Hypothyroidism due to acquired atrophy of thyroid Labs pending - levothyroxine (SYNTHROID) 50 MCG tablet; Take 1 tablet (50 mcg total) by mouth daily.  Dispense: 90 tablet; Refill: 1 - Thyroid Panel With TSH  5. Stage 3a chronic kidney disease (HCC) Labs pending  6. Peripheral edema Elevated when siting - furosemide (LASIX) 40 MG tablet; Take 1 tablet (40 mg total) by mouth daily.  Dispense: 90 tablet; Refill: 1  7. BMI 37.0-37.9, adult Discussed diet and exercise for person with BMI >25 Will recheck weight in 3-6 months   8. Preoperative clearance Cardiac and medical clearance for surgery - EKG 12-Lead - DG Chest 2 View   Labs pending Health Maintenance reviewed Diet and exercise encouraged  Follow up plan: 6 months   Mary-Margaret Daphine Deutscher, FNP

## 2022-11-02 NOTE — Patient Instructions (Signed)
Fall Prevention in the Home, Adult Falls can cause injuries and can happen to people of all ages. There are many things you can do to make your home safer and to help prevent falls. What actions can I take to prevent falls? General information Use good lighting in all rooms. Make sure to: Replace any light bulbs that burn out. Turn on the lights in dark areas and use night-lights. Keep items that you use often in easy-to-reach places. Lower the shelves around your home if needed. Move furniture so that there are clear paths around it. Do not use throw rugs or other things on the floor that can make you trip. If any of your floors are uneven, fix them. Add color or contrast paint or tape to clearly mark and help you see: Grab bars or handrails. First and last steps of staircases. Where the edge of each step is. If you use a ladder or stepladder: Make sure that it is fully opened. Do not climb a closed ladder. Make sure the sides of the ladder are locked in place. Have someone hold the ladder while you use it. Know where your pets are as you move through your home. What can I do in the bathroom?     Keep the floor dry. Clean up any water on the floor right away. Remove soap buildup in the bathtub or shower. Buildup makes bathtubs and showers slippery. Use non-skid mats or decals on the floor of the bathtub or shower. Attach bath mats securely with double-sided, non-slip rug tape. If you need to sit down in the shower, use a non-slip stool. Install grab bars by the toilet and in the bathtub and shower. Do not use towel bars as grab bars. What can I do in the bedroom? Make sure that you have a light by your bed that is easy to reach. Do not use any sheets or blankets on your bed that hang to the floor. Have a firm chair or bench with side arms that you can use for support when you get dressed. What can I do in the kitchen? Clean up any spills right away. If you need to reach something  above you, use a step stool with a grab bar. Keep electrical cords out of the way. Do not use floor polish or wax that makes floors slippery. What can I do with my stairs? Do not leave anything on the stairs. Make sure that you have a light switch at the top and the bottom of the stairs. Make sure that there are handrails on both sides of the stairs. Fix handrails that are broken or loose. Install non-slip stair treads on all your stairs if they do not have carpet. Avoid having throw rugs at the top or bottom of the stairs. Choose a carpet that does not hide the edge of the steps on the stairs. Make sure that the carpet is firmly attached to the stairs. Fix carpet that is loose or worn. What can I do on the outside of my home? Use bright outdoor lighting. Fix the edges of walkways and driveways and fix any cracks. Clear paths of anything that can make you trip, such as tools or rocks. Add color or contrast paint or tape to clearly mark and help you see anything that might make you trip as you walk through a door, such as a raised step or threshold. Trim any bushes or trees on paths to your home. Check to see if handrails are loose   or broken and that both sides of all steps have handrails. Install guardrails along the edges of any raised decks and porches. Have leaves, snow, or ice cleared regularly. Use sand, salt, or ice melter on paths if you live where there is ice and snow during the winter. Clean up any spills in your garage right away. This includes grease or oil spills. What other actions can I take? Review your medicines with your doctor. Some medicines can cause dizziness or changes in blood pressure, which increase your risk of falling. Wear shoes that: Have a low heel. Do not wear high heels. Have rubber bottoms and are closed at the toe. Feel good on your feet and fit well. Use tools that help you move around if needed. These include: Canes. Walkers. Scooters. Crutches. Ask  your doctor what else you can do to help prevent falls. This may include seeing a physical therapist to learn to do exercises to move better and get stronger. Where to find more information Centers for Disease Control and Prevention, [employer]: cdc.gov National Institute on Aging: nia.nih.gov National Institute on Aging: nia.nih.gov Contact a doctor if: You are afraid of falling at home. You feel weak, drowsy, or dizzy at home. You fall at home. Get help right away if you: Lose consciousness or have trouble moving after a fall. Have a fall that causes a head injury. These symptoms may be an emergency. Get help right away. Call 911. Do not wait to see if the symptoms will go away. Do not drive yourself to the hospital. This information is not intended to replace advice given to you by your health care provider. Make sure you discuss any questions you have with your health care provider. Document Revised: 02/09/2022 Document Reviewed: 02/09/2022 Elsevier Patient Education  2023 Elsevier Inc.  

## 2022-11-02 NOTE — Addendum Note (Signed)
Addended by: Srijan Givan, MARY-MARGARET on: 11/02/2022 10:39 AM   Modules accepted: Orders  

## 2022-11-02 NOTE — Addendum Note (Signed)
Addended by: Bennie Pierini on: 11/02/2022 10:21 AM   Modules accepted: Level of Service

## 2022-11-03 LAB — LIPID PANEL
Chol/HDL Ratio: 2.8 ratio (ref 0.0–4.4)
Cholesterol, Total: 115 mg/dL (ref 100–199)
HDL: 41 mg/dL (ref >39)
Triglycerides: 97 mg/dL (ref 0–149)
VLDL Cholesterol Cal: 18 mg/dL (ref 5–40)

## 2022-11-03 LAB — CBC WITH DIFFERENTIAL/PLATELET
Basos: 1 %
Hematocrit: 44.2 % (ref 34.0–46.6)
Immature Granulocytes: 0 %
Lymphocytes Absolute: 1.8 10*3/uL (ref 0.7–3.1)
Lymphs: 28 %
MCH: 29.7 pg (ref 26.6–33.0)
MCV: 88 fL (ref 79–97)
Monocytes Absolute: 0.4 10*3/uL (ref 0.1–0.9)
Monocytes: 5 %
Neutrophils Absolute: 4.1 10*3/uL (ref 1.4–7.0)
Neutrophils: 64 %
RDW: 13.3 % (ref 11.7–15.4)

## 2022-11-03 LAB — THYROID PANEL WITH TSH
Free Thyroxine Index: 1.8 (ref 1.2–4.9)
T3 Uptake Ratio: 26 % (ref 24–39)
T4, Total: 7.1 ug/dL (ref 4.5–12.0)

## 2022-11-03 LAB — CMP14+EGFR
Albumin: 3.8 g/dL (ref 3.8–4.8)
BUN: 15 mg/dL (ref 8–27)
Bilirubin Total: 0.5 mg/dL (ref 0.0–1.2)
CO2: 22 mmol/L (ref 20–29)
Chloride: 106 mmol/L (ref 96–106)
Creatinine, Ser: 1.18 mg/dL — ABNORMAL HIGH (ref 0.57–1.00)
Globulin, Total: 2.9 g/dL (ref 1.5–4.5)
Glucose: 89 mg/dL (ref 70–99)
Potassium: 4.6 mmol/L (ref 3.5–5.2)
Total Protein: 6.7 g/dL (ref 6.0–8.5)
eGFR: 48 mL/min/{1.73_m2} — ABNORMAL LOW (ref >59)

## 2022-11-04 ENCOUNTER — Telehealth: Payer: Self-pay

## 2022-11-04 NOTE — Telephone Encounter (Signed)
Faxed [facility] Surgical clearance form, ov notes, labs and EKG today.  Fax [phone]

## 2022-11-30 ENCOUNTER — Telehealth: Payer: Self-pay | Admitting: Nurse Practitioner

## 2022-11-30 ENCOUNTER — Ambulatory Visit: Payer: Medicare Other

## 2022-11-30 NOTE — Telephone Encounter (Signed)
Scheduled patient for tomorrow at 8:45

## 2022-12-01 ENCOUNTER — Ambulatory Visit (INDEPENDENT_AMBULATORY_CARE_PROVIDER_SITE_OTHER): Payer: Medicare Other | Admitting: Nurse Practitioner

## 2022-12-01 ENCOUNTER — Encounter: Payer: Self-pay | Admitting: Nurse Practitioner

## 2022-12-01 VITALS — BP 140/92 | HR 88 | Temp 97.6°F | Resp 20 | Ht 66.0 in | Wt 218.0 lb

## 2022-12-01 DIAGNOSIS — Z01818 Encounter for other preprocedural examination: Secondary | ICD-10-CM

## 2022-12-01 DIAGNOSIS — E538 Deficiency of other specified B group vitamins: Secondary | ICD-10-CM

## 2022-12-01 NOTE — Progress Notes (Signed)
Subjective:    Patient ID: Kimberly Walsh, female    DOB: 28-Apr-1946, 77 y.o.   MRN: [id number]   Chief Complaint:  Wants to discuss upcoming surgery (Need a letter with instructions for plavix for surgeon/)   HPI  Patient is scheduled for total knee replacement in 2 weeks. She needs letter to hold plavix and when to restart. Her vertigo has gotten better using scopolamine patches.   Patient Active Problem List   Diagnosis Date Noted   Completed stroke (HCC) 09/22/2019   PVC (premature ventricular contraction) 09/22/2019   Late effect of cerebrovascular accident (CVA) 04/20/2019   CKD (chronic kidney disease) stage 3, GFR 30-59 ml/min (HCC) 03/07/2018   Peripheral edema 09/15/2017   BMI 37.0-37.9, adult 09/28/2016   Mixed hyperlipidemia 05/05/2016   Essential hypertension, benign 03/01/2014   Hypothyroidism 03/01/2014   Osteoarthritis 04/25/2010       Review of Systems  Constitutional:  Negative for diaphoresis.  Eyes:  Negative for pain.  Respiratory:  Negative for shortness of breath.   Cardiovascular:  Negative for chest pain, palpitations and leg swelling.  Gastrointestinal:  Negative for abdominal pain.  Endocrine: Negative for polydipsia.  Skin:  Negative for rash.  Neurological:  Negative for dizziness, weakness and headaches.  Hematological:  Does not bruise/bleed easily.  All other systems reviewed and are negative.      Objective:   Physical Exam Vitals and nursing note reviewed.  Constitutional:      General: She is not in acute distress.    Appearance: Normal appearance. She is well-developed.  HENT:     Head: Normocephalic.     Right Ear: Tympanic membrane normal.     Left Ear: Tympanic membrane normal.     Nose: Nose normal.     Mouth/Throat:     Mouth: Mucous membranes are moist.  Eyes:     Pupils: Pupils are equal, round, and reactive to light.  Neck:     Vascular: No carotid bruit or JVD.  Cardiovascular:     Rate and Rhythm: Normal  rate and regular rhythm.     Heart sounds: Normal heart sounds.  Pulmonary:     Effort: Pulmonary effort is normal. No respiratory distress.     Breath sounds: Normal breath sounds. No wheezing or rales.  Chest:     Chest wall: No tenderness.  Abdominal:     General: Bowel sounds are normal. There is no distension or abdominal bruit.     Palpations: Abdomen is soft. There is no hepatomegaly, splenomegaly, mass or pulsatile mass.     Tenderness: There is no abdominal tenderness.  Musculoskeletal:        General: Normal range of motion.     Cervical back: Normal range of motion and neck supple.     Right lower leg: Edema (2+) present.     Left lower leg: Edema (1+) present.  Lymphadenopathy:     Cervical: No cervical adenopathy.  Skin:    General: Skin is warm and dry.  Neurological:     Mental Status: She is alert and oriented to person, place, and time.     Deep Tendon Reflexes: Reflexes are normal and symmetric.  Psychiatric:        Behavior: Behavior normal.        Thought Content: Thought content normal.        Judgment: Judgment normal.     BP (!) 140/92   Pulse 88   Temp 97.6 F (36.4 C) (  Temporal)   Resp 20   Ht 5\' 6"  (1.676 m)   Wt 218 lb (98.9 kg)   SpO2 99%   BMI 35.19 kg/m        Assessment & Plan:   Kimberly Walsh in today with chief complaint of Medical Management of Chronic Issues and Wants to discuss upcoming surgery (Need a letter with instructions for plavix for surgeon/)   1. Pre-operative clearance Letter written for patient to give to ortho about her plavix- see attached RTO prn    The above assessment and management plan was discussed with the patient. The patient verbalized understanding of and has agreed to the management plan. Patient is aware to call the clinic if symptoms persist or worsen. Patient is aware when to return to the clinic for a follow-up visit. Patient educated on when it is appropriate to go to the emergency department.    Kimberly Daphine Deutscher, FNP

## 2022-12-01 NOTE — Addendum Note (Signed)
Addended by: Bennie Pierini on: 12/01/2022 09:28 AM   Modules accepted: Level of Service

## 2022-12-01 NOTE — Patient Instructions (Signed)
Preparing for Knee Replacement Getting prepared before knee replacement surgery can make recovery easier and more comfortable. This document provides some tips and guidelines that will help you prepare for your surgery. Talk with your health care provider so you can learn what to expect before, during, and after surgery. Ask questions if you do not understand something. Tell a health care provider about: Any allergies you have. All medicines you are taking, including vitamins, herbs, eye drops, creams, and over-the-counter medicines. Any problems you or family members have had with anesthetic medicines. Any blood disorders you have. Any surgeries you have had. Any medical conditions you have. Whether you are pregnant or may be pregnant. What happens before the procedure? Visit your health care providers Keep all appointments before surgery. You will need to have a physical exam before surgery (preoperative exam) to make sure it is safe for you to have knee replacement surgery. You may also need to have more tests. When you go to the exam, bring a list of all the medicines and supplements, including herbs and vitamins, that you take. Have dental care and routine cleanings done before surgery. Germs from anywhere in your body, including your mouth, can travel to your new joint and infect it. Tell your dentist that you plan to have knee replacement surgery. Know the costs of surgery To find out how much the surgery will cost, call your insurance company as soon as you decide to have surgery. Ask questions like: How much of the surgery and hospital stay will be covered? What will be covered for: Medical equipment? Rehabilitation facilities? Home care? Prepare your home Pick a recovery spot that is not your bed. It is better that you sit more upright during recovery. You may want to use a recliner. Place items that you often use on a small table near your recovery spot. These may include the TV  remote, a cordless phone or your mobile phone, a book, a laptop computer, and a water glass. Move other items you will need to shelves and drawers that are at countertop height. Do this in your kitchen, bathroom, and bedroom. You may be given a walker to use at home. Check if you will have enough room to use a walker. Move around your home with your hands out about 6 inches (15 cm) from your sides. You will have enough room if you do not hit anything with your hands as you do this. Walk from: Your recovery spot to your kitchen and bathroom. Your bed to the bathroom. Prepare some meals to freeze and reheat later. Make your home safe for recovery     Remove all clutter and throw rugs from your floors. This will help you avoid tripping. Consider getting safety equipment that will be helpful during your recovery, such as: Grab bars in the shower and near the toilet. A raised toilet seat. This will help you get on and off the toilet more easily. A tub or shower bench. Prepare your body If you smoke, quit as soon as you can before surgery. If there is time, it is best to quit several months before surgery. Tell your surgeon if you use any products that contain nicotine or tobacco. These products include cigarettes, chewing tobacco, and vaping devices, such as e-cigarettes. These can delay healing. If you need help quitting, ask your health care provider. Talk to your health care provider about doing exercises before your surgery. Doing these exercises in the weeks before your surgery may help reduce   pain and improve function after surgery. Be sure to follow the exercise program given by your health care provider. Maintain a healthy diet. Do not change your diet before surgery unless your health care provider tells you to do that. Do not drink any alcohol for at least 48 hours before surgery. Plan your recovery In the first couple of weeks after surgery, it will be harder for you to do some of your  regular activities. You may get tired easily, and you will have limited movement in your leg. To make sure you have all the help you need after your surgery: Plan to have a responsible adult take you home from the hospital. Your health care provider will tell you how many days you can expect to be in the hospital. Cancel all work, caregiving, and volunteer responsibilities for at least 4-6 weeks after surgery. Plan to have a responsible adult stay with you day and night for the first week. This person should be someone you are comfortable with. You may need this person to help you with your exercises and personal care, such as bathing and using the toilet. If you live alone, arrange for someone to take care of your home and pets for the first 4-6 weeks after surgery. Arrange for drivers to take you to follow-up visits, the grocery store, and other places you may need to go for at least 4-6 weeks. Consider applying for a disability parking permit. To get an application, call your local department of motor vehicles [facility]) or your health care provider's office. Summary Getting prepared before knee replacement surgery can make your recovery easier and more comfortable. Keep all visits to your health care provider before surgery. You will have an exam and may have tests to make sure that you are ready for your surgery. Prepare your home and arrange for help at home. Plan to have a responsible adult take you home from the hospital. Also, plan to have a responsible adult stay with you day and night for the first week after you leave the hospital. This information is not intended to replace advice given to you by your health care provider. Make sure you discuss any questions you have with your health care provider. Document Revised: 11/28/2019 Document Reviewed: 11/28/2019 Elsevier Patient Education  2024 [employer].

## 2022-12-02 DIAGNOSIS — M25561 Pain in right knee: Secondary | ICD-10-CM | POA: Diagnosis not present

## 2022-12-03 NOTE — Progress Notes (Signed)
COVID Vaccine received:  []  No [x]  Yes Date of any COVID positive Test in last 90 days:  PCP - [facility] FNP Cardiologist -   Chest x-ray - 11/02/22 EPIC EKG -  11/02/22  EPIC Stress Test - 01/21/2011 EPIC ECHO - 01/21/2011 EPIC Cardiac Cath -   Medical clearance Mary-Margaret Daphine Deutscher FNP 11/02/22 EPIC  Bowel Prep - [x]  No  []   Yes ______  Pacemaker / ICD device []  No []  Yes   Spinal Cord Stimulator:[]  No []  Yes       History of Sleep Apnea? []  No []  Yes   CPAP used?- []  No []  Yes    Does the patient monitor blood sugar?          [x]  No []  Yes  []  N/A  Patient has: [x]  NO Hx DM   []  Pre-DM                 []  DM1  []   DM2 Does patient have a [employer] or [employer]? []  No []  Yes   Fasting Blood Sugar Ranges-  Checks Blood Sugar _____ times a day  GLP1 agonist / usual dose -  GLP1 instructions:  SGLT-2 inhibitors / usual dose -  SGLT-2 instructions:   Blood Thinner / Instructions:Plavix Aspirin Instructions:  Comments:   Activity level: Patient is able / unable to climb a flight of stairs without difficulty; []  No CP  []  No SOB, but would have ___   Patient can / can not perform ADLs without assistance.   Anesthesia review:   Patient denies shortness of breath, fever, cough and chest pain at PAT appointment.  Patient verbalized understanding and agreement to the Pre-Surgical Instructions that were given to them at this PAT appointment. Patient was also educated of the need to review these PAT instructions again prior to his/her surgery.I reviewed the appropriate phone numbers to call if they have any and questions or concerns.

## 2022-12-03 NOTE — H&P (Signed)
TOTAL KNEE ADMISSION H&P  Patient is being admitted for left total knee arthroplasty.  Subjective:  Chief Complaint: Left knee pain.  HPI: Kimberly Walsh, 77 y.o. female has a history of pain and functional disability in the left knee due to arthritis and has failed non-surgical conservative treatments for greater than 12 weeks to include NSAID's and/or analgesics, corticosteriod injections, viscosupplementation injections, and activity modification. Onset of symptoms was gradual, starting 6 years ago with gradually worsening course since that time. The patient noted no past surgery on the left knee.  Patient currently rates pain in the left knee at 8 out of 10 with activity. Patient has worsening of pain with activity and weight bearing and pain that interferes with activities of daily living. Patient has evidence of subchondral cysts, periarticular osteophytes, and joint space narrowing by imaging studies. There is no active infection.  Patient Active Problem List   Diagnosis Date Noted   Completed stroke (HCC) 09/22/2019   PVC (premature ventricular contraction) 09/22/2019   Late effect of cerebrovascular accident (CVA) 04/20/2019   CKD (chronic kidney disease) stage 3, GFR 30-59 ml/min (HCC) 03/07/2018   Peripheral edema 09/15/2017   BMI 37.0-37.9, adult 09/28/2016   Mixed hyperlipidemia 05/05/2016   Essential hypertension, benign 03/01/2014   Hypothyroidism 03/01/2014   Osteoarthritis 04/25/2010    Past Medical History:  Diagnosis Date   Hyperlipidemia    Venous stasis    Vertigo     Past Surgical History:  Procedure Laterality Date   basal carcinoma rt leg     CHOLECYSTECTOMY     FINGER SURGERY Right    Ring finger   MIDDLE EAR SURGERY     Fungus   TUBAL LIGATION     VEIN SURGERY      Prior to Admission medications   Medication Sig Start Date End Date Taking? Authorizing Provider  acetaminophen (TYLENOL) 500 MG tablet Take 1,000 mg by mouth every 8 (eight) hours as  needed for moderate pain.   Yes [provider]  atorvastatin (LIPITOR) 40 MG tablet Take 1 tablet (40 mg total) by mouth daily. 11/02/22  Yes Daphine Deutscher, Mary-Margaret, FNP  clopidogrel (PLAVIX) 75 MG tablet Take 1 tablet (75 mg total) by mouth daily. 11/02/22  Yes Daphine Deutscher, Mary-Margaret, FNP  cyclobenzaprine (FLEXERIL) 10 MG tablet Take 1 tablet (10 mg total) by mouth 3 (three) times daily as needed for muscle spasms. 09/29/22  Yes Daphine Deutscher, Mary-Margaret, FNP  furosemide (LASIX) 40 MG tablet Take 1 tablet (40 mg total) by mouth daily. 11/02/22  Yes Daphine Deutscher, Mary-Margaret, FNP  levothyroxine (SYNTHROID) 50 MCG tablet Take 1 tablet (50 mcg total) by mouth daily. 11/02/22  Yes Martin, Mary-Margaret, FNP  losartan (COZAAR) 25 MG tablet Take 1 tablet (25 mg total) by mouth daily. 11/02/22  Yes Daphine Deutscher, Mary-Margaret, FNP  meclizine (ANTIVERT) 25 MG tablet Take 1 tablet by mouth every 6 hours as need for vertigo 06/04/22  Yes Daphine Deutscher, Mary-Margaret, FNP  scopolamine (TRANSDERM-SCOP) 1 MG/3DAYS Place 1 patch (1.5 mg total) onto the skin every 3 (three) days. Patient not taking: Reported on 12/02/2022 11/02/22   Bennie Pierini, FNP    Allergies  Allergen Reactions   Ace Inhibitors Cough    Social History   Socioeconomic History   Marital status: Widowed    Spouse name: Not on file   Number of children: 3   Years of education: 12   Highest education level: High school graduate  Occupational History    Employer: [employer]  Comment: part time  Tobacco Use   Smoking status: Never   Smokeless tobacco: Never  Vaping Use   Vaping Use: Never used  Substance and Sexual Activity   Alcohol use: No   Drug use: No   Sexual activity: Not Currently  Other Topics Concern   Not on file  Social History Narrative   Retired Photographer   3 children-2 daughters and 1 son   10 grandchildren   Widowed since 04/22/2019.   Social Determinants of Health   Financial Resource Strain: Low Risk  (06/24/2022)    Overall Financial Resource Strain (CARDIA)    Difficulty of Paying Living Expenses: Not hard at all  Food Insecurity: No Food Insecurity (06/24/2022)   Hunger Vital Sign    Worried About Running Out of Food in the Last Year: Never true    Ran Out of Food in the Last Year: Never true  Transportation Needs: No Transportation Needs (06/24/2022)   PRAPARE - Administrator, Civil Service (Medical): No    Lack of Transportation (Non-Medical): No  Physical Activity: Insufficiently Active (06/24/2022)   Exercise Vital Sign    Days of Exercise per Week: 3 days    Minutes of Exercise per Session: 30 min  Stress: No Stress Concern Present (06/24/2022)   [employer] of Occupational Health - Occupational Stress Questionnaire    Feeling of Stress : Not at all  Social Connections: Moderately Integrated (06/24/2022)   Social Connection and Isolation Panel [NHANES]    Frequency of Communication with Friends and Family: More than three times a week    Frequency of Social Gatherings with Friends and Family: More than three times a week    Attends Religious Services: More than 4 times per year    Active Member of [employer] or Organizations: Yes    Attends Banker Meetings: More than 4 times per year    Marital Status: Widowed  Intimate Partner Violence: Not At Risk (06/24/2022)   Humiliation, Afraid, Rape, and Kick questionnaire    Fear of Current or Ex-Partner: No    Emotionally Abused: No    Physically Abused: No    Sexually Abused: No    Tobacco Use: Low Risk  (12/01/2022)   Patient History    Smoking Tobacco Use: Never    Smokeless Tobacco Use: Never    Passive Exposure: Not on file   Social History   Substance and Sexual Activity  Alcohol Use No    Family History  Problem Relation Age of Onset   COPD Father     ROS  Objective:  Physical Exam: - Well-developed female, alert and oriented, no apparent distress.  - Antalgic gait pattern on the right.  - Right  knee No effusion, varus deformity present. Range of motion is approximately 5-120. Moderate crepitus on range of motion. Tender medially. No lateral tenderness or instability.  - Left knee No effusion. Range of motion is 0-125. Crepitus on range of motion. Tender medially. No lateral tenderness or instability.    IMAGING:  Radiographs of both knees (AP and lateral) taken today demonstrate advanced end-stage arthritis in both knees. The right knee shows bone-on-bone contact in the medial and patellofemoral compartments, with massive osteophytes and tibial subluxation. The left knee also exhibits bone-on-bone contact in the medial and patellofemoral compartments, although not as severe as the right knee.  Assessment/Plan:  End stage arthritis, left knee   The patient history, physical examination, clinical judgment of the provider and imaging studies  are consistent with end stage degenerative joint disease of the left knee and total knee arthroplasty is deemed medically necessary. The treatment options including medical management, injection therapy arthroscopy and arthroplasty were discussed at length. The risks and benefits of total knee arthroplasty were presented and reviewed. The risks due to aseptic loosening, infection, stiffness, patella tracking problems, thromboembolic complications and other imponderables were discussed. The patient acknowledged the explanation, agreed to proceed with the plan and consent was signed. Patient is being admitted for inpatient treatment for surgery, pain control, PT, OT, prophylactic antibiotics, VTE prophylaxis, progressive ambulation and ADLs and discharge planning. The patient is planning to be discharged  home with OPPT scheduled .   Patient's anticipated LOS is less than 2 midnights, meeting these requirements: - Younger than 54 - Lives within 1 hour of care - Has a competent adult at home to recover with post-op recover - NO history of  - Chronic pain  requiring opiods  - Diabetes  - Coronary Artery Disease  - Heart failure  - Heart attack  - Stroke  - DVT/VTE  - Cardiac arrhythmia  - Respiratory Failure/COPD  - Renal failure  - Anemia  - Advanced Liver disease  Therapy Plans: Beryle Quant, referral faxed Disposition: Home with daughters Planned DVT Prophylaxis: ASA + Plavix DME Needed: None, has RW PCP: Bennie Pierini, FNP-C (clearance received) TXA: Topical Allergies: None Anesthesia Concerns: Nausea/vomiting BMI: 35.6 Last HgbA1c: Not diabetic  Pharmacy: [facility] in [place]  Other: -Per PCP, stop Plavix 5 days prior to surgery and resume on day of surgery   - Patient was instructed on what medications to stop prior to surgery. - Follow-up visit in 2 weeks with Dr. Lequita Halt - Begin physical therapy following surgery - Pre-operative lab work as pre-surgical testing - Prescriptions will be provided in hospital at time of discharge  Weston Brass, PA-C Orthopedic Surgery [facility] [place]

## 2022-12-03 NOTE — Patient Instructions (Signed)
SURGICAL WAITING ROOM VISITATION  Patients having surgery or a procedure may have no more than 2 support people in the waiting area - these visitors may rotate.    Children under the age of 48 must have an adult with them who is not the patient.  Due to an increase in RSV and influenza rates and associated hospitalizations, children ages 76 and under may not visit patients in [facility] hospitals.  If the patient needs to stay at the hospital during part of their recovery, the visitor guidelines for inpatient rooms apply. Pre-op nurse will coordinate an appropriate time for 1 support person to accompany patient in pre-op.  This support person may not rotate.    Please refer to the [facility] website for the visitor guidelines for Inpatients (after your surgery is over and you are in a regular room).       Your procedure is scheduled on: 12/14/22   Report to [facility] Main Entrance    Report to admitting at 5:15 AM   Call this number if you have problems the morning of surgery [phone]   Do not eat food :After Midnight.   After Midnight you may have the following liquids until 4:15 AM DAY OF SURGERY  Water Non-Citrus Juices (without pulp, NO RED-Apple, White grape, White cranberry) Black Coffee (NO MILK/CREAM OR CREAMERS, sugar ok)  Clear Tea (NO MILK/CREAM OR CREAMERS, sugar ok) regular and decaf                             Plain Jell-O (NO RED)                                           Fruit ices (not with fruit pulp, NO RED)                                     Popsicles (NO RED)                                                               Sports drinks like Gatorade (NO RED)                  The day of surgery:  Drink ONE (1) Pre-Surgery Clear Ensure  at 4:15 AM the morning of surgery. Drink in one sitting. Do not sip.  This drink was given to you during your hospital  pre-op appointment visit. Nothing else to drink after completing the  Pre-Surgery Clear  Ensure     Oral Hygiene is also important to reduce your risk of infection.                                    Remember - BRUSH YOUR TEETH THE MORNING OF SURGERY WITH YOUR REGULAR TOOTHPASTE  DENTURES WILL BE REMOVED PRIOR TO SURGERY PLEASE DO NOT APPLY "Poly grip" OR ADHESIVES!!!   Do NOT smoke after Midnight   Take these medicines the morning of surgery with  A SIP OF WATER: Tylenol, Atorvastatin(Lipitor), Synthroid(Levothyroxine)  Bring CPAP mask and tubing day of surgery.                              You may not have any metal on your body including hair pins, jewelry, and body piercing             Do not wear make-up, lotions, powders, perfumes, or deodorant  Do not wear nail polish including gel and S&S, artificial/acrylic nails, or any other type of covering on natural nails including finger and toenails. If you have artificial nails, gel coating, etc. that needs to be removed by a nail salon please have this removed prior to surgery or surgery may need to be canceled/ delayed if the surgeon/ anesthesia feels like they are unable to be safely monitored.   Do not shave  48 hours prior to surgery.     Do not bring valuables to the hospital. [facility] IS NOT             RESPONSIBLE   FOR VALUABLES.   Contacts, glasses, dentures or bridgework may not be worn into surgery.   Bring small overnight bag day of surgery.   DO NOT BRING YOUR HOME MEDICATIONS TO THE HOSPITAL. PHARMACY WILL DISPENSE MEDICATIONS LISTED ON YOUR MEDICATION LIST TO YOU DURING YOUR ADMISSION IN THE HOSPITAL!    Patients discharged on the day of surgery will not be allowed to drive home.  Someone NEEDS to stay with you for the first 24 hours after anesthesia.   Special Instructions: Bring a copy of your healthcare power of attorney and living will documents the day of surgery if you haven't scanned them before.              Please read over the following fact sheets you were given: IF YOU HAVE QUESTIONS  ABOUT YOUR PRE-OP INSTRUCTIONS PLEASE CALL [phone]   If you received a COVID test during your pre-op visit  it is requested that you wear a mask when out in public, stay away from anyone that may not be feeling well and notify your surgeon if you develop symptoms. If you test positive for Covid or have been in contact with anyone that has tested positive in the last 10 days please notify you surgeon.      Pre-operative 5 CHG Bath Instructions   You can play a key role in reducing the risk of infection after surgery. Your skin needs to be as free of germs as possible. You can reduce the number of germs on your skin by washing with CHG (chlorhexidine gluconate) soap before surgery. CHG is an antiseptic soap that kills germs and continues to kill germs even after washing.   DO NOT use if you have an allergy to chlorhexidine/CHG or antibacterial soaps. If your skin becomes reddened or irritated, stop using the CHG and notify one of our RNs at [phone].   Please shower with the CHG soap starting 4 days before surgery using the following schedule:     Please keep in mind the following:  DO NOT shave, including legs and underarms, starting the day of your first shower.   You may shave your face at any point before/day of surgery.  Place clean sheets on your bed the day you start using CHG soap. Use a clean washcloth (not used since being washed) for each shower. DO NOT sleep with pets once  you start using the CHG.   CHG Shower Instructions:  If you choose to wash your hair and private area, wash first with your normal shampoo/soap.  After you use shampoo/soap, rinse your hair and body thoroughly to remove shampoo/soap residue.  Turn the water OFF and apply about 3 tablespoons (45 ml) of CHG soap to a CLEAN washcloth.  Apply CHG soap ONLY FROM YOUR NECK DOWN TO YOUR TOES (washing for 3-5 minutes)  DO NOT use CHG soap on face, private areas, open wounds, or sores.  Pay special attention  to the area where your surgery is being performed.  If you are having back surgery, having someone wash your back for you may be helpful. Wait 2 minutes after CHG soap is applied, then you may rinse off the CHG soap.  Pat dry with a clean towel  Put on clean clothes/pajamas   If you choose to wear lotion, please use ONLY the CHG-compatible lotions on the back of this paper.     Additional instructions for the day of surgery: DO NOT APPLY any lotions, deodorants, cologne, or perfumes.   Put on clean/comfortable clothes.  Brush your teeth.  Ask your nurse before applying any prescription medications to the skin.      CHG Compatible Lotions   Aveeno Moisturizing lotion  Cetaphil Moisturizing Cream  Cetaphil Moisturizing Lotion  Clairol Herbal Essence Moisturizing Lotion, Dry Skin  Clairol Herbal Essence Moisturizing Lotion, Extra Dry Skin  Clairol Herbal Essence Moisturizing Lotion, Normal Skin  Curel Age Defying Therapeutic Moisturizing Lotion with Alpha Hydroxy  Curel Extreme Care Body Lotion  Curel Soothing Hands Moisturizing Hand Lotion  Curel Therapeutic Moisturizing Cream, Fragrance-Free  Curel Therapeutic Moisturizing Lotion, Fragrance-Free  Curel Therapeutic Moisturizing Lotion, Original Formula  Eucerin Daily Replenishing Lotion  Eucerin Dry Skin Therapy Plus Alpha Hydroxy Crme  Eucerin Dry Skin Therapy Plus Alpha Hydroxy Lotion  Eucerin Original Crme  Eucerin Original Lotion  Eucerin Plus Crme Eucerin Plus Lotion  Eucerin TriLipid Replenishing Lotion  Keri Anti-Bacterial Hand Lotion  Keri Deep Conditioning Original Lotion Dry Skin Formula Softly Scented  Keri Deep Conditioning Original Lotion, Fragrance Free Sensitive Skin Formula  Keri Lotion Fast Absorbing Fragrance Free Sensitive Skin Formula  Keri Lotion Fast Absorbing Softly Scented Dry Skin Formula  Keri Original Lotion  Keri Skin Renewal Lotion Keri Silky Smooth Lotion  Keri Silky Smooth Sensitive Skin  Lotion  Nivea Body Creamy Conditioning Oil  Nivea Body Extra Enriched Lotion  Nivea Body Original Lotion  Nivea Body Sheer Moisturizing Lotion Nivea Crme  Nivea Skin Firming Lotion  NutraDerm 30 Skin Lotion  NutraDerm Skin Lotion  NutraDerm Therapeutic Skin Cream  NutraDerm Therapeutic Skin Lotion  ProShield Protective Hand Cream  Provon moisturizing lotion Incentive Spirometer (Watch this video at home: [email or web address])  An incentive spirometer is a tool that can help keep your lungs clear and active. This tool measures how well you are filling your lungs with each breath. Taking long deep breaths may help reverse or decrease the chance of developing breathing (pulmonary) problems (especially infection) following: A long period of time when you are unable to move or be active. BEFORE THE PROCEDURE  If the spirometer includes an indicator to show your best effort, your nurse or respiratory therapist will set it to a desired goal. If possible, sit up straight or lean slightly forward. Try not to slouch. Hold the incentive spirometer in an upright position. INSTRUCTIONS FOR USE  Sit on the edge of your bed if possible,  or sit up as far as you can in bed or on a chair. Hold the incentive spirometer in an upright position. Breathe out normally. Place the mouthpiece in your mouth and seal your lips tightly around it. Breathe in slowly and as deeply as possible, raising the piston or the ball toward the top of the column. Hold your breath for 3-5 seconds or for as long as possible. Allow the piston or ball to fall to the bottom of the column. Remove the mouthpiece from your mouth and breathe out normally. Rest for a few seconds and repeat Steps 1 through 7 at least 10 times every 1-2 hours when you are awake. Take your time and take a few normal breaths between deep breaths. The spirometer may include an indicator to show your best effort. Use the indicator as a  goal to work toward during each repetition. After each set of 10 deep breaths, practice coughing to be sure your lungs are clear. If you have an incision (the cut made at the time of surgery), support your incision when coughing by placing a pillow or rolled up towels firmly against it. Once you are able to get out of bed, walk around indoors and cough well. You may stop using the incentive spirometer when instructed by your caregiver.  RISKS AND COMPLICATIONS Take your time so you do not get dizzy or light-headed. If you are in pain, you may need to take or ask for pain medication before doing incentive spirometry. It is harder to take a deep breath if you are having pain. AFTER USE Rest and breathe slowly and easily. It can be helpful to keep track of a log of your progress. Your caregiver can provide you with a simple table to help with this. If you are using the spirometer at home, follow these instructions: SEEK MEDICAL CARE IF:  You are having difficultly using the spirometer. You have trouble using the spirometer as often as instructed. Your pain medication is not giving enough relief while using the spirometer. You develop fever of 100.5 F (38.1 C) or higher. SEEK IMMEDIATE MEDICAL CARE IF:  You cough up bloody sputum that had not been present before. You develop fever of 102 F (38.9 C) or greater. You develop worsening pain at or near the incision site. MAKE SURE YOU:  Understand these instructions. Will watch your condition. Will get help right away if you are not doing well or get worse. Document Released: 10/19/2006 Document Revised: 08/31/2011 Document Reviewed: 12/20/2006 [facility] Patient Information 2014 [place], [place].

## 2022-12-07 ENCOUNTER — Encounter (HOSPITAL_COMMUNITY)
Admission: RE | Admit: 2022-12-07 | Discharge: 2022-12-07 | Disposition: A | Discharge status: Home / Self Care | Payer: Medicare Other | Source: Ambulatory Visit | Attending: Orthopedic Surgery | Admitting: Orthopedic Surgery

## 2022-12-07 ENCOUNTER — Encounter (HOSPITAL_COMMUNITY): Payer: Self-pay

## 2022-12-07 ENCOUNTER — Other Ambulatory Visit: Payer: Self-pay

## 2022-12-07 DIAGNOSIS — I10 Essential (primary) hypertension: Secondary | ICD-10-CM | POA: Insufficient documentation

## 2022-12-07 DIAGNOSIS — Z01812 Encounter for preprocedural laboratory examination: Secondary | ICD-10-CM | POA: Diagnosis not present

## 2022-12-07 DIAGNOSIS — Z01818 Encounter for other preprocedural examination: Secondary | ICD-10-CM

## 2022-12-07 HISTORY — DX: Hypothyroidism, unspecified: E03.9

## 2022-12-07 HISTORY — DX: Nausea with vomiting, unspecified: R11.2

## 2022-12-07 HISTORY — DX: Essential (primary) hypertension: I10

## 2022-12-07 HISTORY — DX: Other specified postprocedural states: Z98.890

## 2022-12-07 LAB — BASIC METABOLIC PANEL
Anion gap: 10 (ref 5–15)
BUN: 20 mg/dL (ref 8–23)
CO2: 28 mmol/L (ref 22–32)
Calcium: 9.2 mg/dL (ref 8.9–10.3)
Chloride: 103 mmol/L (ref 98–111)
Creatinine, Ser: 1.15 mg/dL — ABNORMAL HIGH (ref 0.44–1.00)
GFR, Estimated: 50 mL/min — ABNORMAL LOW (ref >60)
Glucose, Bld: 97 mg/dL (ref 70–99)
Potassium: 3.8 mmol/L (ref 3.5–5.1)
Sodium: 141 mmol/L (ref 135–145)

## 2022-12-07 LAB — CBC
HCT: 45.4 % (ref 36.0–46.0)
Hemoglobin: 14.6 g/dL (ref 12.0–15.0)
MCH: 28.9 pg (ref 26.0–34.0)
MCHC: 32.2 g/dL (ref 30.0–36.0)
MCV: 89.9 fL (ref 80.0–100.0)
Platelets: 222 10*3/uL (ref 150–400)
RBC: 5.05 MIL/uL (ref 3.87–5.11)
RDW: 13.5 % (ref 11.5–15.5)
WBC: 7.7 10*3/uL (ref 4.0–10.5)
nRBC: 0 % (ref 0.0–0.2)

## 2022-12-07 LAB — SURGICAL PCR SCREEN
MRSA, PCR: NEGATIVE
Staphylococcus aureus: NEGATIVE

## 2022-12-07 NOTE — Progress Notes (Addendum)
COVID Vaccine received:  []  No [x]  Yes Date of any COVID positive Test in last 26 days:No  PCP - [facility] FNP Cardiologist - Dr. Kennyth Arnold  Chest x-ray - 11/02/22 EPIC EKG -  11/02/22  EPIC Stress Test - 01/21/2011 EPIC ECHO - 01/21/2011 EPIC Cardiac Cath - No  Medical clearance Mary-Margaret Daphine Deutscher FNP 11/02/22 EPIC  Bowel Prep - [x]  No  []   Yes ______  Pacemaker / ICD device [x]  No []  Yes   Spinal Cord Stimulator:[x]  No []  Yes       History of Sleep Apnea? [x]  No []  Yes   CPAP used?- [x]  No []  Yes    Does the patient monitor blood sugar?          [x]  No []  Yes  []  N/A  Patient has: [x]  NO Hx DM   []  Pre-DM                 []  DM1  []   DM2 Does patient have a [employer] or Dexacom? [x]  No []  Yes   Fasting Blood Sugar Ranges-  Checks Blood Sugar _____ times a day  GLP1 agonist / usual dose - No GLP1 instructions:  SGLT-2 inhibitors / usual dose - No SGLT-2 instructions:   Blood Thinner / Instructions:Plavix- Hold x 5 days prior to surgery per MD Aspirin Instructions:  Comments:   Activity level: Patient is able to climb a flight of stairs without difficulty; [x]  No CP  [x]  No SOB, but would have _right knee pain__   Patient can  perform ADLs without assistance.   Anesthesia review:   Patient denies shortness of breath, fever, cough and chest pain at PAT appointment.  Patient verbalized understanding and agreement to the Pre-Surgical Instructions that were given to them at this PAT appointment. Patient was also educated of the need to review these PAT instructions again prior to his/her surgery.I reviewed the appropriate phone numbers to call if they have any and questions or concerns.

## 2022-12-07 NOTE — Patient Instructions (Signed)
SURGICAL WAITING ROOM VISITATION  Patients having surgery or a procedure may have no more than 2 support people in the waiting area - these visitors may rotate.    Children under the age of 16 must have an adult with them who is not the patient.  Due to an increase in RSV and influenza rates and associated hospitalizations, children ages 72 and under may not visit patients in [facility] hospitals.  If the patient needs to stay at the hospital during part of their recovery, the visitor guidelines for inpatient rooms apply. Pre-op nurse will coordinate an appropriate time for 1 support person to accompany patient in pre-op.  This support person may not rotate.    Please refer to the [facility] website for the visitor guidelines for Inpatients (after your surgery is over and you are in a regular room).       Your procedure is scheduled on: 12/14/22   Report to [facility] Main Entrance    Report to admitting at  5:15  AM   Call this number if you have problems the morning of surgery [phone]   Do not eat food :After Midnight.   After Midnight you may have the following liquids until 4:14 AM DAY OF SURGERY  Water Non-Citrus Juices (without pulp, NO RED-Apple, White grape, White cranberry) Black Coffee (NO MILK/CREAM OR CREAMERS, sugar ok)  Clear Tea (NO MILK/CREAM OR CREAMERS, sugar ok) regular and decaf                             Plain Jell-O (NO RED)                                           Fruit ices (not with fruit pulp, NO RED)                                     Popsicles (NO RED)                                                               Sports drinks like Gatorade (NO RED)                  The day of surgery:  Drink ONE (1) Pre-Surgery Clear Ensure  at 4:15 AM the morning of surgery. Drink in one sitting. Do not sip.  This drink was given to you during your hospital  pre-op appointment visit. Nothing else to drink after completing the  Pre-Surgery  Clear Ensure    Oral Hygiene is also important to reduce your risk of infection.                                    Remember - BRUSH YOUR TEETH THE MORNING OF SURGERY WITH YOUR REGULAR TOOTHPASTE    Take these medicines the morning of surgery with A SIP OF WATER:  Tylenol, Lipitor, Synthroid             You  may not have any metal on your body including hair pins, jewelry, and body piercing             Do not wear make-up, lotions, powders, perfumes/cologne, or deodorant  Do not wear nail polish including gel and S&S, artificial/acrylic nails, or any other type of covering on natural nails including finger and toenails. If you have artificial nails, gel coating, etc. that needs to be removed by a nail salon please have this removed prior to surgery or surgery may need to be canceled/ delayed if the surgeon/ anesthesia feels like they are unable to be safely monitored.   Do not shave  48 hours prior to surgery.    Do not bring valuables to the hospital. [place] IS NOT             RESPONSIBLE   FOR VALUABLES.   Contacts, glasses, dentures or bridgework may not be worn into surgery.   Bring small overnight bag day of surgery.   DO NOT BRING YOUR HOME MEDICATIONS TO THE HOSPITAL. PHARMACY WILL DISPENSE MEDICATIONS LISTED ON YOUR MEDICATION LIST TO YOU DURING YOUR ADMISSION IN THE HOSPITAL!    Patients discharged on the day of surgery will not be allowed to drive home.  Someone NEEDS to stay with you for the first 24 hours after anesthesia.   Special Instructions: Bring a copy of your healthcare power of attorney and living will documents the day of surgery if you haven't scanned them before.              Please read over the following fact sheets you were given: IF YOU HAVE QUESTIONS ABOUT YOUR PRE-OP INSTRUCTIONS PLEASE CALL [phone]   If you received a COVID test during your pre-op visit  it is requested that you wear a mask when out in public, stay away from anyone that may  not be feeling well and notify your surgeon if you develop symptoms. If you test positive for Covid or have been in contact with anyone that has tested positive in the last 10 days please notify you surgeon.      Pre-operative 5 CHG Bath Instructions   You can play a key role in reducing the risk of infection after surgery. Your skin needs to be as free of germs as possible. You can reduce the number of germs on your skin by washing with CHG (chlorhexidine gluconate) soap before surgery. CHG is an antiseptic soap that kills germs and continues to kill germs even after washing.   DO NOT use if you have an allergy to chlorhexidine/CHG or antibacterial soaps. If your skin becomes reddened or irritated, stop using the CHG and notify one of our RNs at [phone].   Please shower with the CHG soap starting 4 days before surgery using the following schedule:     Please keep in mind the following:  DO NOT shave, including legs and underarms, starting the day of your first shower.   You may shave your face at any point before/day of surgery.  Place clean sheets on your bed the day you start using CHG soap. Use a clean washcloth (not used since being washed) for each shower. DO NOT sleep with pets once you start using the CHG.   CHG Shower Instructions:  If you choose to wash your hair and private area, wash first with your normal shampoo/soap.  After you use shampoo/soap, rinse your hair and body thoroughly to remove shampoo/soap residue.  Turn the water OFF  and apply about 3 tablespoons (45 ml) of CHG soap to a CLEAN washcloth.  Apply CHG soap ONLY FROM YOUR NECK DOWN TO YOUR TOES (washing for 3-5 minutes)  DO NOT use CHG soap on face, private areas, open wounds, or sores.  Pay special attention to the area where your surgery is being performed.  If you are having back surgery, having someone wash your back for you may be helpful. Wait 2 minutes after CHG soap is applied, then you may rinse  off the CHG soap.  Pat dry with a clean towel  Put on clean clothes/pajamas   If you choose to wear lotion, please use ONLY the CHG-compatible lotions on the back of this paper.     Additional instructions for the day of surgery: DO NOT APPLY any lotions, deodorants, cologne, or perfumes.   Put on clean/comfortable clothes.  Brush your teeth.  Ask your nurse before applying any prescription medications to the skin.      CHG Compatible Lotions   Aveeno Moisturizing lotion  Cetaphil Moisturizing Cream  Cetaphil Moisturizing Lotion  Clairol Herbal Essence Moisturizing Lotion, Dry Skin  Clairol Herbal Essence Moisturizing Lotion, Extra Dry Skin  Clairol Herbal Essence Moisturizing Lotion, Normal Skin  Curel Age Defying Therapeutic Moisturizing Lotion with Alpha Hydroxy  Curel Extreme Care Body Lotion  Curel Soothing Hands Moisturizing Hand Lotion  Curel Therapeutic Moisturizing Cream, Fragrance-Free  Curel Therapeutic Moisturizing Lotion, Fragrance-Free  Curel Therapeutic Moisturizing Lotion, Original Formula  Eucerin Daily Replenishing Lotion  Eucerin Dry Skin Therapy Plus Alpha Hydroxy Crme  Eucerin Dry Skin Therapy Plus Alpha Hydroxy Lotion  Eucerin Original Crme  Eucerin Original Lotion  Eucerin Plus Crme Eucerin Plus Lotion  Eucerin TriLipid Replenishing Lotion  Keri Anti-Bacterial Hand Lotion  Keri Deep Conditioning Original Lotion Dry Skin Formula Softly Scented  Keri Deep Conditioning Original Lotion, Fragrance Free Sensitive Skin Formula  Keri Lotion Fast Absorbing Fragrance Free Sensitive Skin Formula  Keri Lotion Fast Absorbing Softly Scented Dry Skin Formula  Keri Original Lotion  Keri Skin Renewal Lotion Keri Silky Smooth Lotion  Keri Silky Smooth Sensitive Skin Lotion  Nivea Body Creamy Conditioning Oil  Nivea Body Extra Enriched Lotion  Nivea Body Original Lotion  Nivea Body Sheer Moisturizing Lotion Nivea Crme  Nivea Skin Firming Lotion  NutraDerm 30  Skin Lotion  NutraDerm Skin Lotion  NutraDerm Therapeutic Skin Cream  NutraDerm Therapeutic Skin Lotion  ProShield Protective Hand Cream  Provon moisturizing lotion Incentive Spirometer (Watch this video at home: [email or web address])  An incentive spirometer is a tool that can help keep your lungs clear and active. This tool measures how well you are filling your lungs with each breath. Taking long deep breaths may help reverse or decrease the chance of developing breathing (pulmonary) problems (especially infection) following: A long period of time when you are unable to move or be active. BEFORE THE PROCEDURE  If the spirometer includes an indicator to show your best effort, your nurse or respiratory therapist will set it to a desired goal. If possible, sit up straight or lean slightly forward. Try not to slouch. Hold the incentive spirometer in an upright position. INSTRUCTIONS FOR USE  Sit on the edge of your bed if possible, or sit up as far as you can in bed or on a chair. Hold the incentive spirometer in an upright position. Breathe out normally. Place the mouthpiece in your mouth and seal your lips tightly around it. Breathe in slowly and as deeply as possible, raising  the piston or the ball toward the top of the column. Hold your breath for 3-5 seconds or for as long as possible. Allow the piston or ball to fall to the bottom of the column. Remove the mouthpiece from your mouth and breathe out normally. Rest for a few seconds and repeat Steps 1 through 7 at least 10 times every 1-2 hours when you are awake. Take your time and take a few normal breaths between deep breaths. The spirometer may include an indicator to show your best effort. Use the indicator as a goal to work toward during each repetition. After each set of 10 deep breaths, practice coughing to be sure your lungs are clear. If you have an incision (the cut made at the time of surgery), support  your incision when coughing by placing a pillow or rolled up towels firmly against it. Once you are able to get out of bed, walk around indoors and cough well. You may stop using the incentive spirometer when instructed by your caregiver.  RISKS AND COMPLICATIONS Take your time so you do not get dizzy or light-headed. If you are in pain, you may need to take or ask for pain medication before doing incentive spirometry. It is harder to take a deep breath if you are having pain. AFTER USE Rest and breathe slowly and easily. It can be helpful to keep track of a log of your progress. Your caregiver can provide you with a simple table to help with this. If you are using the spirometer at home, follow these instructions: SEEK MEDICAL CARE IF:  You are having difficultly using the spirometer. You have trouble using the spirometer as often as instructed. Your pain medication is not giving enough relief while using the spirometer. You develop fever of 100.5 F (38.1 C) or higher. SEEK IMMEDIATE MEDICAL CARE IF:  You cough up bloody sputum that had not been present before. You develop fever of 102 F (38.9 C) or greater. You develop worsening pain at or near the incision site. MAKE SURE YOU:  Understand these instructions. Will watch your condition. Will get help right away if you are not doing well or get worse. Document Released: 10/19/2006 Document Revised: 08/31/2011 Document Reviewed: 12/20/2006 [facility] Patient Information 2014 [place], [place].

## 2022-12-07 NOTE — Progress Notes (Signed)
COVID Vaccine received:  []  No [x]  Yes Date of any COVID positive Test in last 90 days:NO  PCP - Mary-Margaret [facility] FNP Cardiologist -   Chest x-ray - 11/02/22 EPIC EKG -  11/02/22  EPIC Stress Test - 01/21/2011 EPIC ECHO - 01/21/2011 EPIC Cardiac Cath - No  Medical clearance Mary-Margaret Daphine Deutscher FNP 11/02/22 EPIC  Bowel Prep - [x]  No  []   Yes ______  Pacemaker / ICD device [x]  No []  Yes   Spinal Cord Stimulator:[x]  No []  Yes       History of Sleep Apnea? [x]  No []  Yes   CPAP used?- [x]  No []  Yes    Does the patient monitor blood sugar?          [x]  No []  Yes  []  N/A  Patient has: [x]  NO Hx DM   []  Pre-DM                 []  DM1  []   DM2 Does patient have a [employer] or Dexacom? [x]  No []  Yes   Fasting Blood Sugar Ranges-  Checks Blood Sugar _____ times a day  GLP1 agonist / usual dose - NO GLP1 instructions:  SGLT-2 inhibitors / usual dose - NO SGLT-2 instructions:   Blood Thinner / Instructions:Plavix Hold Plavix 5 days priot to surgery per Paulene Floor FNP:  Comments:   Activity level: Patient is able to climb a flight of stairs without difficulty; [x]  No CP  [x]  No SOB, but would have ___   Patient canperform ADLs without assistance.   Anesthesia review:   Patient denies shortness of breath, fever, cough and chest pain at PAT appointment.  Patient verbalized understanding and agreement to the Pre-Surgical Instructions that were given to them at this PAT appointment. Patient was also educated of the need to review these PAT instructions again prior to his/her surgery.I reviewed the appropriate phone numbers to call if they have any and questions or concerns.

## 2022-12-14 ENCOUNTER — Ambulatory Visit (HOSPITAL_BASED_OUTPATIENT_CLINIC_OR_DEPARTMENT_OTHER): Payer: Medicare Other | Admitting: Registered Nurse

## 2022-12-14 ENCOUNTER — Ambulatory Visit (HOSPITAL_COMMUNITY): Payer: Medicare Other | Admitting: Registered Nurse

## 2022-12-14 ENCOUNTER — Observation Stay (HOSPITAL_COMMUNITY)
Admission: RE | Admit: 2022-12-14 | Discharge: 2022-12-15 | Disposition: A | Discharge status: Home / Self Care | Payer: Medicare Other | Source: Ambulatory Visit | Attending: Orthopedic Surgery | Admitting: Orthopedic Surgery

## 2022-12-14 ENCOUNTER — Encounter (HOSPITAL_COMMUNITY): Payer: Self-pay | Admitting: Orthopedic Surgery

## 2022-12-14 ENCOUNTER — Encounter (HOSPITAL_COMMUNITY)
Admission: RE | Disposition: A | Discharge status: Home / Self Care | Payer: Self-pay | Source: Ambulatory Visit | Attending: Orthopedic Surgery

## 2022-12-14 ENCOUNTER — Other Ambulatory Visit: Payer: Self-pay

## 2022-12-14 DIAGNOSIS — Z7902 Long term (current) use of antithrombotics/antiplatelets: Secondary | ICD-10-CM | POA: Insufficient documentation

## 2022-12-14 DIAGNOSIS — Z79899 Other long term (current) drug therapy: Secondary | ICD-10-CM | POA: Insufficient documentation

## 2022-12-14 DIAGNOSIS — M1711 Unilateral primary osteoarthritis, right knee: Principal | ICD-10-CM

## 2022-12-14 DIAGNOSIS — G8918 Other acute postprocedural pain: Secondary | ICD-10-CM | POA: Diagnosis not present

## 2022-12-14 DIAGNOSIS — I10 Essential (primary) hypertension: Secondary | ICD-10-CM | POA: Diagnosis not present

## 2022-12-14 DIAGNOSIS — I129 Hypertensive chronic kidney disease with stage 1 through stage 4 chronic kidney disease, or unspecified chronic kidney disease: Secondary | ICD-10-CM | POA: Insufficient documentation

## 2022-12-14 DIAGNOSIS — N183 Chronic kidney disease, stage 3 unspecified: Secondary | ICD-10-CM | POA: Insufficient documentation

## 2022-12-14 DIAGNOSIS — Z85828 Personal history of other malignant neoplasm of skin: Secondary | ICD-10-CM | POA: Insufficient documentation

## 2022-12-14 DIAGNOSIS — Z8673 Personal history of transient ischemic attack (TIA), and cerebral infarction without residual deficits: Secondary | ICD-10-CM | POA: Insufficient documentation

## 2022-12-14 DIAGNOSIS — M179 Osteoarthritis of knee, unspecified: Secondary | ICD-10-CM

## 2022-12-14 DIAGNOSIS — E039 Hypothyroidism, unspecified: Secondary | ICD-10-CM | POA: Insufficient documentation

## 2022-12-14 DIAGNOSIS — Z01818 Encounter for other preprocedural examination: Secondary | ICD-10-CM

## 2022-12-14 DIAGNOSIS — M1712 Unilateral primary osteoarthritis, left knee: Secondary | ICD-10-CM | POA: Diagnosis present

## 2022-12-14 HISTORY — PX: TOTAL KNEE ARTHROPLASTY: SHX125

## 2022-12-14 SURGERY — ARTHROPLASTY, KNEE, TOTAL
Anesthesia: Spinal | Site: Knee | Laterality: Right

## 2022-12-14 MED ORDER — TRANEXAMIC ACID-NACL 1000-0.7 MG/100ML-% IV SOLN
INTRAVENOUS | Status: AC
  Filled 2022-12-14: qty 100

## 2022-12-14 MED ORDER — METOCLOPRAMIDE HCL 5 MG/ML IJ SOLN: 5.0000 mg | Freq: Three times a day (TID) | INTRAMUSCULAR | Status: DC | PRN

## 2022-12-14 MED ORDER — OXYCODONE HCL 5 MG PO TABS
5.0000 mg | ORAL_TABLET | ORAL | Status: DC | PRN
  Administered 2022-12-14: 10 mg via ORAL
  Administered 2022-12-14 (×2): 5 mg via ORAL
  Administered 2022-12-14 – 2022-12-15 (×5): 10 mg via ORAL
  Filled 2022-12-14 (×4): qty 2
  Filled 2022-12-14: qty 1
  Filled 2022-12-14: qty 2
  Filled 2022-12-14: qty 1
  Filled 2022-12-14: qty 2

## 2022-12-14 MED ORDER — DEXAMETHASONE SODIUM PHOSPHATE 10 MG/ML IJ SOLN
8.0000 mg | Freq: Once | INTRAMUSCULAR | Status: AC
  Administered 2022-12-14: 8 mg via INTRAVENOUS

## 2022-12-14 MED ORDER — TRANEXAMIC ACID-NACL 1000-0.7 MG/100ML-% IV SOLN
1000.0000 mg | INTRAVENOUS | Status: AC
  Administered 2022-12-14: 1000 mg via INTRAVENOUS

## 2022-12-14 MED ORDER — BUPIVACAINE LIPOSOME 1.3 % IJ SUSP: 20.0000 mL | Freq: Once | INTRAMUSCULAR | Status: DC

## 2022-12-14 MED ORDER — TRAMADOL HCL 50 MG PO TABS
50.0000 mg | ORAL_TABLET | Freq: Four times a day (QID) | ORAL | Status: DC | PRN
  Administered 2022-12-14: 50 mg via ORAL
  Administered 2022-12-15: 100 mg via ORAL
  Filled 2022-12-14: qty 2
  Filled 2022-12-14: qty 1

## 2022-12-14 MED ORDER — EPHEDRINE 5 MG/ML INJ
INTRAVENOUS | Status: AC
  Filled 2022-12-14: qty 5

## 2022-12-14 MED ORDER — LACTATED RINGERS IV SOLN: INTRAVENOUS | Status: DC

## 2022-12-14 MED ORDER — MECLIZINE HCL 25 MG PO TABS: 25.0000 mg | ORAL_TABLET | Freq: Three times a day (TID) | ORAL | Status: DC | PRN

## 2022-12-14 MED ORDER — ONDANSETRON HCL 4 MG PO TABS: 4.0000 mg | ORAL_TABLET | Freq: Four times a day (QID) | ORAL | Status: DC | PRN

## 2022-12-14 MED ORDER — BISACODYL 10 MG RE SUPP: 10.0000 mg | Freq: Every day | RECTAL | Status: DC | PRN

## 2022-12-14 MED ORDER — CEFAZOLIN SODIUM-DEXTROSE 2-4 GM/100ML-% IV SOLN
2.0000 g | INTRAVENOUS | Status: AC
  Administered 2022-12-14: 2 g via INTRAVENOUS

## 2022-12-14 MED ORDER — 0.9 % SODIUM CHLORIDE (POUR BTL) OPTIME
TOPICAL | Status: DC | PRN
  Administered 2022-12-14: 1000 mL

## 2022-12-14 MED ORDER — METOCLOPRAMIDE HCL 5 MG PO TABS: 5.0000 mg | ORAL_TABLET | Freq: Three times a day (TID) | ORAL | Status: DC | PRN

## 2022-12-14 MED ORDER — ASPIRIN 325 MG PO TBEC
325.0000 mg | DELAYED_RELEASE_TABLET | Freq: Every day | ORAL | Status: DC
  Administered 2022-12-15: 325 mg via ORAL
  Filled 2022-12-14: qty 1

## 2022-12-14 MED ORDER — CHLORHEXIDINE GLUCONATE 0.12 % MT SOLN
15.0000 mL | Freq: Once | OROMUCOSAL | Status: AC
  Administered 2022-12-14: 15 mL via OROMUCOSAL

## 2022-12-14 MED ORDER — DOCUSATE SODIUM 100 MG PO CAPS
100.0000 mg | ORAL_CAPSULE | Freq: Two times a day (BID) | ORAL | Status: DC
  Administered 2022-12-14 – 2022-12-15 (×2): 100 mg via ORAL
  Filled 2022-12-14 (×2): qty 1

## 2022-12-14 MED ORDER — BUPIVACAINE IN DEXTROSE 0.75-8.25 % IT SOLN
INTRATHECAL | Status: DC | PRN
  Administered 2022-12-14: .6 mL via INTRATHECAL

## 2022-12-14 MED ORDER — BUPIVACAINE LIPOSOME 1.3 % IJ SUSP
INTRAMUSCULAR | Status: DC | PRN
  Administered 2022-12-14: 20 mL

## 2022-12-14 MED ORDER — ONDANSETRON HCL 4 MG/2ML IJ SOLN: 4.0000 mg | Freq: Four times a day (QID) | INTRAMUSCULAR | Status: DC | PRN

## 2022-12-14 MED ORDER — CLOPIDOGREL BISULFATE 75 MG PO TABS
75.0000 mg | ORAL_TABLET | Freq: Every day | ORAL | Status: DC
  Administered 2022-12-15: 75 mg via ORAL
  Filled 2022-12-14: qty 1

## 2022-12-14 MED ORDER — ROPIVACAINE HCL 5 MG/ML IJ SOLN
INTRAMUSCULAR | Status: DC | PRN
  Administered 2022-12-14: 20 mL via PERINEURAL

## 2022-12-14 MED ORDER — EPHEDRINE SULFATE-NACL 50-0.9 MG/10ML-% IV SOSY
PREFILLED_SYRINGE | INTRAVENOUS | Status: DC | PRN
  Administered 2022-12-14: 5 mg via INTRAVENOUS

## 2022-12-14 MED ORDER — LEVOTHYROXINE SODIUM 50 MCG PO TABS
50.0000 ug | ORAL_TABLET | Freq: Every day | ORAL | Status: DC
  Administered 2022-12-15: 50 ug via ORAL
  Filled 2022-12-14: qty 1

## 2022-12-14 MED ORDER — CLONIDINE HCL (ANALGESIA) 100 MCG/ML EP SOLN
EPIDURAL | Status: DC | PRN
  Administered 2022-12-14: 50 ug

## 2022-12-14 MED ORDER — ORAL CARE MOUTH RINSE: 15.0000 mL | Freq: Once | OROMUCOSAL | Status: AC

## 2022-12-14 MED ORDER — ONDANSETRON HCL 4 MG/2ML IJ SOLN
INTRAMUSCULAR | Status: AC
  Filled 2022-12-14: qty 2

## 2022-12-14 MED ORDER — ONDANSETRON HCL 4 MG/2ML IJ SOLN
INTRAMUSCULAR | Status: DC | PRN
  Administered 2022-12-14: 4 mg via INTRAVENOUS

## 2022-12-14 MED ORDER — ATORVASTATIN CALCIUM 40 MG PO TABS
40.0000 mg | ORAL_TABLET | Freq: Every day | ORAL | Status: DC
  Administered 2022-12-15: 40 mg via ORAL
  Filled 2022-12-14: qty 1

## 2022-12-14 MED ORDER — POVIDONE-IODINE 10 % EX SWAB: 2.0000 | Freq: Once | CUTANEOUS | Status: DC

## 2022-12-14 MED ORDER — SODIUM CHLORIDE (PF) 0.9 % IJ SOLN
INTRAMUSCULAR | Status: AC
  Filled 2022-12-14: qty 50

## 2022-12-14 MED ORDER — HYDROMORPHONE HCL 1 MG/ML IJ SOLN: 0.5000 mg | INTRAMUSCULAR | Status: DC | PRN

## 2022-12-14 MED ORDER — CEFAZOLIN SODIUM-DEXTROSE 2-4 GM/100ML-% IV SOLN
INTRAVENOUS | Status: AC
  Filled 2022-12-14: qty 100

## 2022-12-14 MED ORDER — DIPHENHYDRAMINE HCL 12.5 MG/5ML PO ELIX: 12.5000 mg | ORAL_SOLUTION | ORAL | Status: DC | PRN

## 2022-12-14 MED ORDER — METHOCARBAMOL 500 MG IVPB - SIMPLE MED: 500.0000 mg | Freq: Four times a day (QID) | INTRAVENOUS | Status: DC | PRN

## 2022-12-14 MED ORDER — FENTANYL CITRATE (PF) 100 MCG/2ML IJ SOLN
INTRAMUSCULAR | Status: DC | PRN
  Administered 2022-12-14: 25 ug via INTRAVENOUS
  Administered 2022-12-14: 50 ug via INTRAVENOUS

## 2022-12-14 MED ORDER — FENTANYL CITRATE PF 50 MCG/ML IJ SOSY: 25.0000 ug | PREFILLED_SYRINGE | INTRAMUSCULAR | Status: DC | PRN

## 2022-12-14 MED ORDER — PROPOFOL 1000 MG/100ML IV EMUL
INTRAVENOUS | Status: AC
  Filled 2022-12-14: qty 100

## 2022-12-14 MED ORDER — PHENOL 1.4 % MT LIQD: 1.0000 | OROMUCOSAL | Status: DC | PRN

## 2022-12-14 MED ORDER — BUPIVACAINE LIPOSOME 1.3 % IJ SUSP
INTRAMUSCULAR | Status: AC
  Filled 2022-12-14: qty 20

## 2022-12-14 MED ORDER — CEFAZOLIN SODIUM-DEXTROSE 2-4 GM/100ML-% IV SOLN
2.0000 g | Freq: Four times a day (QID) | INTRAVENOUS | Status: AC
  Administered 2022-12-14 (×2): 2 g via INTRAVENOUS
  Filled 2022-12-14 (×2): qty 100

## 2022-12-14 MED ORDER — ACETAMINOPHEN 10 MG/ML IV SOLN
1000.0000 mg | Freq: Four times a day (QID) | INTRAVENOUS | Status: DC
  Administered 2022-12-14: 1000 mg via INTRAVENOUS

## 2022-12-14 MED ORDER — MENTHOL 3 MG MT LOZG: 1.0000 | LOZENGE | OROMUCOSAL | Status: DC | PRN

## 2022-12-14 MED ORDER — SODIUM CHLORIDE (PF) 0.9 % IJ SOLN
INTRAMUSCULAR | Status: DC | PRN
  Administered 2022-12-14: 60 mL

## 2022-12-14 MED ORDER — FENTANYL CITRATE (PF) 100 MCG/2ML IJ SOLN
INTRAMUSCULAR | Status: AC
  Filled 2022-12-14: qty 2

## 2022-12-14 MED ORDER — PROPOFOL 500 MG/50ML IV EMUL
INTRAVENOUS | Status: DC | PRN
  Administered 2022-12-14: 40 ug/kg/min via INTRAVENOUS

## 2022-12-14 MED ORDER — FLEET ENEMA 7-19 GM/118ML RE ENEM: 1.0000 | ENEMA | Freq: Once | RECTAL | Status: DC | PRN

## 2022-12-14 MED ORDER — SODIUM CHLORIDE (PF) 0.9 % IJ SOLN
INTRAMUSCULAR | Status: AC
  Filled 2022-12-14: qty 10

## 2022-12-14 MED ORDER — SODIUM CHLORIDE 0.9 % IR SOLN
Status: DC | PRN
  Administered 2022-12-14: 1000 mL

## 2022-12-14 MED ORDER — STERILE WATER FOR IRRIGATION IR SOLN
Status: DC | PRN
  Administered 2022-12-14: 2000 mL

## 2022-12-14 MED ORDER — FUROSEMIDE 40 MG PO TABS
40.0000 mg | ORAL_TABLET | Freq: Every day | ORAL | Status: DC
  Filled 2022-12-14: qty 1

## 2022-12-14 MED ORDER — SODIUM CHLORIDE 0.9 % IV SOLN: INTRAVENOUS | Status: DC

## 2022-12-14 MED ORDER — PHENYLEPHRINE HCL-NACL 20-0.9 MG/250ML-% IV SOLN
INTRAVENOUS | Status: DC | PRN
  Administered 2022-12-14: 25 ug/min via INTRAVENOUS

## 2022-12-14 MED ORDER — AMISULPRIDE (ANTIEMETIC) 5 MG/2ML IV SOLN: 10.0000 mg | Freq: Once | INTRAVENOUS | Status: DC | PRN

## 2022-12-14 MED ORDER — LOSARTAN POTASSIUM 25 MG PO TABS
25.0000 mg | ORAL_TABLET | Freq: Every day | ORAL | Status: DC
  Filled 2022-12-14: qty 1

## 2022-12-14 MED ORDER — ACETAMINOPHEN 500 MG PO TABS
1000.0000 mg | ORAL_TABLET | Freq: Four times a day (QID) | ORAL | Status: AC
  Administered 2022-12-14 – 2022-12-15 (×3): 1000 mg via ORAL
  Filled 2022-12-14 (×4): qty 2

## 2022-12-14 MED ORDER — METHOCARBAMOL 500 MG PO TABS
500.0000 mg | ORAL_TABLET | Freq: Four times a day (QID) | ORAL | Status: DC | PRN
  Administered 2022-12-14 – 2022-12-15 (×3): 500 mg via ORAL
  Filled 2022-12-14 (×3): qty 1

## 2022-12-14 MED ORDER — POLYETHYLENE GLYCOL 3350 17 G PO PACK: 17.0000 g | PACK | Freq: Every day | ORAL | Status: DC | PRN

## 2022-12-14 MED ORDER — DEXAMETHASONE SODIUM PHOSPHATE 10 MG/ML IJ SOLN
INTRAMUSCULAR | Status: AC
  Filled 2022-12-14: qty 1

## 2022-12-14 MED ORDER — DEXAMETHASONE SODIUM PHOSPHATE 10 MG/ML IJ SOLN
10.0000 mg | Freq: Once | INTRAMUSCULAR | Status: AC
  Administered 2022-12-15: 10 mg via INTRAVENOUS
  Filled 2022-12-14: qty 1

## 2022-12-14 SURGICAL SUPPLY — 57 items
ADH SKN CLS APL DERMABOND .7 (GAUZE/BANDAGES/DRESSINGS) ×1
ATTUNE PS FEM RT SZ 5 CEM KNEE (Femur) IMPLANT
ATTUNE PSRP INSE SZ5 7 KNEE (Insert) IMPLANT
BAG COUNTER SPONGE SURGICOUNT (BAG) IMPLANT
BAG SPEC THK2 15X12 ZIP CLS (MISCELLANEOUS) ×1
BAG SPNG CNTER NS LX DISP (BAG)
BAG ZIPLOCK 12X15 (MISCELLANEOUS) ×2 IMPLANT
BASEPLATE TIBIAL ROTATING SZ 4 (Knees) IMPLANT
BLADE SAG 18X100X1.27 (BLADE) ×2 IMPLANT
BLADE SAW SGTL 11.0X1.19X90.0M (BLADE) ×2 IMPLANT
BNDG CMPR 5X62 HK CLSR LF (GAUZE/BANDAGES/DRESSINGS) ×1
BNDG CMPR MED 10X6 ELC LF (GAUZE/BANDAGES/DRESSINGS) ×1
BNDG ELASTIC 6INX 5YD STR LF (GAUZE/BANDAGES/DRESSINGS) ×2 IMPLANT
BNDG ELASTIC 6X10 VLCR STRL LF (GAUZE/BANDAGES/DRESSINGS) IMPLANT
BOWL SMART MIX CTS (DISPOSABLE) ×2 IMPLANT
BSPLAT TIB 4 CMNT ROT PLAT STR (Knees) ×1 IMPLANT
CEMENT HV SMART SET (Cement) ×4 IMPLANT
COVER SURGICAL LIGHT HANDLE (MISCELLANEOUS) ×2 IMPLANT
CUFF TOURN SGL QUICK 34 (TOURNIQUET CUFF) ×1
CUFF TRNQT CYL 34X4.125X (TOURNIQUET CUFF) ×2 IMPLANT
DERMABOND ADVANCED .7 DNX12 (GAUZE/BANDAGES/DRESSINGS) ×2 IMPLANT
DRAPE INCISE IOBAN 66X45 STRL (DRAPES) ×2 IMPLANT
DRAPE U-SHAPE 47X51 STRL (DRAPES) ×2 IMPLANT
DRSG AQUACEL AG ADV 3.5X10 (GAUZE/BANDAGES/DRESSINGS) ×2 IMPLANT
DURAPREP 26ML APPLICATOR (WOUND CARE) ×2 IMPLANT
ELECT REM PT RETURN 15FT ADLT (MISCELLANEOUS) ×2 IMPLANT
GLOVE BIO SURGEON STRL SZ 6.5 (GLOVE) IMPLANT
GLOVE BIO SURGEON STRL SZ8 (GLOVE) ×2 IMPLANT
GLOVE BIOGEL PI IND STRL 6.5 (GLOVE) IMPLANT
GLOVE BIOGEL PI IND STRL 7.0 (GLOVE) IMPLANT
GLOVE BIOGEL PI IND STRL 8 (GLOVE) ×2 IMPLANT
GOWN STRL REUS W/ TWL LRG LVL3 (GOWN DISPOSABLE) ×2 IMPLANT
GOWN STRL REUS W/TWL LRG LVL3 (GOWN DISPOSABLE) ×1
HANDPIECE INTERPULSE COAX TIP (DISPOSABLE) ×1
HOLDER FOLEY CATH W/STRAP (MISCELLANEOUS) IMPLANT
IMMOBILIZER KNEE 20 (SOFTGOODS) ×1
IMMOBILIZER KNEE 20 THIGH 36 (SOFTGOODS) ×2 IMPLANT
KIT TURNOVER KIT A (KITS) IMPLANT
MANIFOLD NEPTUNE II (INSTRUMENTS) ×2 IMPLANT
NS IRRIG 1000ML POUR BTL (IV SOLUTION) ×2 IMPLANT
PACK TOTAL KNEE CUSTOM (KITS) ×2 IMPLANT
PADDING CAST ABS COTTON 6X4 NS (CAST SUPPLIES) IMPLANT
PADDING CAST COTTON 6X4 STRL (CAST SUPPLIES) ×4 IMPLANT
PATELLA MEDIAL ATTUN 35MM KNEE (Knees) IMPLANT
PIN STEINMAN FIXATION KNEE (PIN) IMPLANT
PROTECTOR NERVE ULNAR (MISCELLANEOUS) ×2 IMPLANT
SET HNDPC FAN SPRY TIP SCT (DISPOSABLE) ×2 IMPLANT
SPIKE FLUID TRANSFER (MISCELLANEOUS) ×2 IMPLANT
SUT MNCRL AB 4-0 PS2 18 (SUTURE) ×2 IMPLANT
SUT STRATAFIX 0 PDS 27 VIOLET (SUTURE) ×1
SUT VIC AB 2-0 CT1 27 (SUTURE) ×3
SUT VIC AB 2-0 CT1 TAPERPNT 27 (SUTURE) ×6 IMPLANT
SUTURE STRATFX 0 PDS 27 VIOLET (SUTURE) ×2 IMPLANT
TRAY FOLEY MTR SLVR 16FR STAT (SET/KITS/TRAYS/PACK) ×2 IMPLANT
TUBE SUCTION HIGH CAP CLEAR NV (SUCTIONS) ×2 IMPLANT
WATER STERILE IRR 1000ML POUR (IV SOLUTION) ×4 IMPLANT
WRAP KNEE MAXI GEL POST OP (GAUZE/BANDAGES/DRESSINGS) ×2 IMPLANT

## 2022-12-14 NOTE — Progress Notes (Signed)
Orthopedic Tech Progress Note Patient Details:  Kimberly Walsh 10-16-1945 [id number]  CPM Right Knee CPM Right Knee: On Right Knee Flexion (Degrees): 40 Right Knee Extension (Degrees): 10  Post Interventions Patient Tolerated: Well  Darleen Crocker 12/14/2022, 9:12 AM

## 2022-12-14 NOTE — Care Plan (Signed)
Ortho Bundle Case Management Note  Patient Details  Name: Kimberly Walsh MRN: [id number] Date of Birth: Sep 04, 1945                  R TKA on 12/14/22.  DCP: Home with daughters and granddaughter.  DME: No needs. Has RW.  PT: [facility]. Referral faxed.   DME Arranged:  N/A DME Agency:       Additional Comments: Please contact me with any questions of if this plan should need to change.    Despina Pole, Case Manager  [facility]  [phone] 12/14/2022, 2:23 PM

## 2022-12-14 NOTE — Anesthesia Preprocedure Evaluation (Signed)
Anesthesia Evaluation  Patient identified by MRN, date of birth, ID band Patient awake    Reviewed: Allergy & Precautions, NPO status , Patient's Chart, lab work & pertinent test results  History of Anesthesia Complications (+) PONV and history of anesthetic complications  Airway Mallampati: III  TM Distance: >3 FB Neck ROM: Full    Dental  (+) Dental Advisory Given   Pulmonary neg pulmonary ROS   breath sounds clear to auscultation       Cardiovascular hypertension, Pt. on medications  Rhythm:Regular Rate:Normal     Neuro/Psych CVA    GI/Hepatic negative GI ROS, Neg liver ROS,,,  Endo/Other  Hypothyroidism    Renal/GU Renal disease     Musculoskeletal  (+) Arthritis ,    Abdominal   Peds  Hematology  (+) Blood dyscrasia (Last plavix 6/18)   Anesthesia Other Findings   Reproductive/Obstetrics                             Anesthesia Physical Anesthesia Plan  ASA: 3  Anesthesia Plan: Spinal   Post-op Pain Management: Regional block* and Ofirmev IV (intra-op)*   Induction:   PONV Risk Score and Plan: 3 and Propofol infusion, Dexamethasone, Ondansetron and Treatment may vary due to age or medical condition  Airway Management Planned: Natural Airway and Simple Face Mask  Additional Equipment:   Intra-op Plan:   Post-operative Plan:   Informed Consent: I have reviewed the patients History and Physical, chart, labs and discussed the procedure including the risks, benefits and alternatives for the proposed anesthesia with the patient or authorized representative who has indicated his/her understanding and acceptance.       Plan Discussed with: CRNA  Anesthesia Plan Comments:        Anesthesia Quick Evaluation

## 2022-12-14 NOTE — Op Note (Signed)
OPERATIVE REPORT-TOTAL KNEE ARTHROPLASTY   Pre-operative diagnosis- Osteoarthritis  Right knee(s)  Post-operative diagnosis- Osteoarthritis Right knee(s)  Procedure-  Right  Total Knee Arthroplasty  Surgeon- Kimberly Walsh. Kimberly Vasbinder, MD  Assistant- Arther Abbott, PA-C   Anesthesia-   Adductor canal block and spinal  EBL-50 mL   Drains None  Tourniquet time-  Total Tourniquet Time Documented: Thigh (Right) - 35 minutes Total: Thigh (Right) - 35 minutes     Complications- None  Condition-PACU - hemodynamically stable.   Brief Clinical Note  Kimberly Walsh is a 77 y.o. year old female with end stage OA of her right knee with progressively worsening pain and dysfunction. She has constant pain, with activity and at rest and significant functional deficits with difficulties even with ADLs. She has had extensive non-op management including analgesics, injections of cortisone and viscosupplements, and home exercise program, but remains in significant pain with significant dysfunction.Radiographs show bone on bone arthritis medial and patellofemoral. She presents now for right Total Knee Arthroplasty.     Procedure in detail---   The patient is brought into the operating room and positioned supine on the operating table. After successful administration of  Adductor canal block and spinal,   a tourniquet is placed high on the  Right thigh(s) and the lower extremity is prepped and draped in the usual sterile fashion. Time out is performed by the operating team and then the  Right lower extremity is wrapped in Esmarch, knee flexed and the tourniquet inflated to 300 mmHg.       A midline incision is made with a ten blade through the subcutaneous tissue to the level of the extensor mechanism. A fresh blade is used to make a medial parapatellar arthrotomy. Soft tissue over the proximal medial tibia is subperiosteally elevated to the joint line with a knife and into the semimembranosus bursa with a  Cobb elevator. Soft tissue over the proximal lateral tibia is elevated with attention being paid to avoiding the patellar tendon on the tibial tubercle. The patella is everted, knee flexed 90 degrees and the ACL and PCL are removed. Findings are bone on bone medial and patellofemoral with large global osteophytes        The drill is used to create a starting hole in the distal femur and the canal is thoroughly irrigated with sterile saline to remove the fatty contents. The 5 degree Right  valgus alignment guide is placed into the femoral canal and the distal femoral cutting block is pinned to remove 9 mm off the distal femur. Resection is made with an oscillating saw.      The tibia is subluxed forward and the menisci are removed. The extramedullary alignment guide is placed referencing proximally at the medial aspect of the tibial tubercle and distally along the second metatarsal axis and tibial crest. The block is pinned to remove 2mm off the more deficient medial  side. Resection is made with an oscillating saw. Size 4is the most appropriate size for the tibia and the proximal tibia is prepared with the modular drill and keel punch for that size.      The femoral sizing guide is placed and size 5 is most appropriate. Rotation is marked off the epicondylar axis and confirmed by creating a rectangular flexion gap at 90 degrees. The size 5 cutting block is pinned in this rotation and the anterior, posterior and chamfer cuts are made with the oscillating saw. The intercondylar block is then placed and that cut is made.  Trial size 4 tibial component, trial size 5 posterior stabilized femur and a 7  mm posterior stabilized rotating platform insert trial is placed. Full extension is achieved with excellent varus/valgus and anterior/posterior balance throughout full range of motion. The patella is everted and thickness measured to be 22  mm. Free hand resection is taken to 12 mm, a 35 template is placed, lug  holes are drilled, trial patella is placed, and it tracks normally. Osteophytes are removed off the posterior femur with the trial in place. All trials are removed and the cut bone surfaces prepared with pulsatile lavage. Cement is mixed and once ready for implantation, the size 4 tibial implant, size  5 posterior stabilized femoral component, and the size 35 patella are cemented in place and the patella is held with the clamp. The trial insert is placed and the knee held in full extension. The Exparel (20 ml mixed with 60 ml saline) is injected into the extensor mechanism, posterior capsule, medial and lateral gutters and subcutaneous tissues.  All extruded cement is removed and once the cement is hard the permanent 7 mm posterior stabilized rotating platform insert is placed into the tibial tray.      The wound is copiously irrigated with saline solution and the extensor mechanism closed with # 0 Stratofix suture. The tourniquet is released for a total tourniquet time of 35  minutes. Flexion against gravity is 140 degrees and the patella tracks normally. Subcutaneous tissue is closed with 2.0 vicryl and subcuticular with running 4.0 Monocryl. The incision is cleaned and dried and steri-strips and a bulky sterile dressing are applied. The limb is placed into a knee immobilizer and the patient is awakened and transported to recovery in stable condition.      Please note that a surgical assistant was a medical necessity for this procedure in order to perform it in a safe and expeditious manner. Surgical assistant was necessary to retract the ligaments and vital neurovascular structures to prevent injury to them and also necessary for proper positioning of the limb to allow for anatomic placement of the prosthesis.   Kimberly Walsh Kimberly Ospina, MD    12/14/2022, 8:15 AM

## 2022-12-14 NOTE — Anesthesia Procedure Notes (Signed)
Spinal  Patient location during procedure: OR Start time: 12/14/2022 7:11 AM End time: 12/14/2022 7:16 AM Reason for block: surgical anesthesia Staffing Performed: anesthesiologist  Anesthesiologist: Marcene Duos, MD Performed by: Marcene Duos, MD Authorized by: Marcene Duos, MD   Preanesthetic Checklist Completed: patient identified, IV checked, site marked, risks and benefits discussed, surgical consent, monitors and equipment checked, pre-op evaluation and timeout performed Spinal Block Patient position: sitting Prep: DuraPrep Patient monitoring: heart rate, cardiac monitor, continuous pulse ox and blood pressure Approach: midline Location: L4-5 Injection technique: single-shot Needle Needle type: Pencan  Needle gauge: 24 G Needle length: 9 cm Assessment Sensory level: T4 Events: CSF return

## 2022-12-14 NOTE — Discharge Instructions (Signed)
Kimberly Gross, MD Total Joint Specialist [facility] [address], [address] [place], [place] [address] [phone]  TOTAL KNEE REPLACEMENT POSTOPERATIVE DIRECTIONS    Knee Rehabilitation, Guidelines Following Surgery  Results after knee surgery are often greatly improved when you follow the exercise, range of motion and muscle strengthening exercises prescribed by your doctor. Safety measures are also important to protect the knee from further injury. If any of these exercises cause you to have increased pain or swelling in your knee joint, decrease the amount until you are comfortable again and slowly increase them. If you have problems or questions, call your caregiver or physical therapist for advice.   BLOOD CLOT PREVENTION Take a 325 mg aspirin once a day for three weeks with your usual Plavix. Then take an 81 mg aspirin once a day with your usual Plavix. Then discontinue.  You may resume your vitamins/supplements upon discharge from the hospital.  HOME CARE INSTRUCTIONS  Remove items at home which could result in a fall. This includes throw rugs or furniture in walking pathways.  ICE to the affected knee as much as tolerated. Icing helps control swelling. If the swelling is well controlled you will be more comfortable and rehab easier. Continue to use ice on the knee for pain and swelling from surgery. You may notice swelling that will progress down to the foot and ankle. This is normal after surgery. Elevate the leg when you are not up walking on it.    Continue to use the breathing machine which will help keep your temperature down. It is common for your temperature to cycle up and down following surgery, especially at night when you are not up moving around and exerting yourself. The breathing machine keeps your lungs expanded and your temperature down. Do not place pillow under the operative knee, focus on keeping the knee straight while resting  DIET You  may resume your previous home diet once you are discharged from the hospital.  DRESSING / WOUND CARE / SHOWERING Keep your bulky bandage on for 2 days. On the third post-operative day you may remove the Ace bandage and gauze. There is a waterproof adhesive bandage on your skin which will stay in place until your first follow-up appointment. Once you remove this you will not need to place another bandage You may begin showering 3 days following surgery, but do not submerge the incision under water.  ACTIVITY For the first 5 days, the key is rest and control of pain and swelling Do your home exercises twice a day starting on post-operative day 3. On the days you go to physical therapy, just do the home exercises once that day. You should rest, ice and elevate the leg for 50 minutes out of every hour. Get up and walk/stretch for 10 minutes per hour. After 5 days you can increase your activity slowly as tolerated. Walk with your walker as instructed. Use the walker until you are comfortable transitioning to a cane. Walk with the cane in the opposite hand of the operative leg. You may discontinue the cane once you are comfortable and walking steadily. Avoid periods of inactivity such as sitting longer than an hour when not asleep. This helps prevent blood clots.  You may discontinue the knee immobilizer once you are able to perform a straight leg raise while lying down. You may resume a sexual relationship in one month or when given the OK by your doctor.  You may return to work once you  are cleared by your doctor.  Do not drive a car for 6 weeks or until released by your surgeon.  Do not drive while taking narcotics.  TED HOSE STOCKINGS Wear the elastic stockings on both legs for three weeks following surgery during the day. You may remove them at night for sleeping.  WEIGHT BEARING Weight bearing as tolerated with assist device (walker, cane, etc) as directed, use it as long as suggested by your  surgeon or therapist, typically at least 4-6 weeks.  POSTOPERATIVE CONSTIPATION PROTOCOL Constipation - defined medically as fewer than three stools per week and severe constipation as less than one stool per week.  One of the most common issues patients have following surgery is constipation.  Even if you have a regular bowel pattern at home, your normal regimen is likely to be disrupted due to multiple reasons following surgery.  Combination of anesthesia, postoperative narcotics, change in appetite and fluid intake all can affect your bowels.  In order to avoid complications following surgery, here are some recommendations in order to help you during your recovery period.  Colace (docusate) - Pick up an over-the-counter form of Colace or another stool softener and take twice a day as long as you are requiring postoperative pain medications.  Take with a full glass of water daily.  If you experience loose stools or diarrhea, hold the colace until you stool forms back up. If your symptoms do not get better within 1 week or if they get worse, check with your doctor. Dulcolax (bisacodyl) - Pick up over-the-counter and take as directed by the product packaging as needed to assist with the movement of your bowels.  Take with a full glass of water.  Use this product as needed if not relieved by Colace only.  MiraLax (polyethylene glycol) - Pick up over-the-counter to have on hand. MiraLax is a solution that will increase the amount of water in your bowels to assist with bowel movements.  Take as directed and can mix with a glass of water, juice, soda, coffee, or tea. Take if you go more than two days without a movement. Do not use MiraLax more than once per day. Call your doctor if you are still constipated or irregular after using this medication for 7 days in a row.  If you continue to have problems with postoperative constipation, please contact the office for further assistance and recommendations.  If  you experience "the worst abdominal pain ever" or develop nausea or vomiting, please contact the office immediatly for further recommendations for treatment.  ITCHING If you experience itching with your medications, try taking only a single pain pill, or even half a pain pill at a time.  You can also use Benadryl over the counter for itching or also to help with sleep.   MEDICATIONS See your medication summary on the "After Visit Summary" that the nursing staff will review with you prior to discharge.  You may have some home medications which will be placed on hold until you complete the course of blood thinner medication.  It is important for you to complete the blood thinner medication as prescribed by your surgeon.  Continue your approved medications as instructed at time of discharge.  PRECAUTIONS If you experience chest pain or shortness of breath - call 911 immediately for transfer to the hospital emergency department.  If you develop a fever greater that 101 F, purulent drainage from wound, increased redness or drainage from wound, foul odor from the wound/dressing, or  calf pain - CONTACT YOUR SURGEON.                                                   FOLLOW-UP APPOINTMENTS Make sure you keep all of your appointments after your operation with your surgeon and caregivers. You should call the office at the above phone number and make an appointment for approximately two weeks after the date of your surgery or on the date instructed by your surgeon outlined in the "After Visit Summary".  RANGE OF MOTION AND STRENGTHENING EXERCISES  Rehabilitation of the knee is important following a knee injury or an operation. After just a few days of immobilization, the muscles of the thigh which control the knee become weakened and shrink (atrophy). Knee exercises are designed to build up the tone and strength of the thigh muscles and to improve knee motion. Often times heat used for twenty to thirty minutes  before working out will loosen up your tissues and help with improving the range of motion but do not use heat for the first two weeks following surgery. These exercises can be done on a training (exercise) mat, on the floor, on a table or on a bed. Use what ever works the best and is most comfortable for you Knee exercises include:  Leg Lifts - While your knee is still immobilized in a splint or cast, you can do straight leg raises. Lift the leg to 60 degrees, hold for 3 sec, and slowly lower the leg. Repeat 10-20 times 2-3 times daily. Perform this exercise against resistance later as your knee gets better.  Quad and Hamstring Sets - Tighten up the muscle on the front of the thigh (Quad) and hold for 5-10 sec. Repeat this 10-20 times hourly. Hamstring sets are done by pushing the foot backward against an object and holding for 5-10 sec. Repeat as with quad sets.  Leg Slides: Lying on your back, slowly slide your foot toward your buttocks, bending your knee up off the floor (only go as far as is comfortable). Then slowly slide your foot back down until your leg is flat on the floor again. Angel Wings: Lying on your back spread your legs to the side as far apart as you can without causing discomfort.  A rehabilitation program following serious knee injuries can speed recovery and prevent re-injury in the future due to weakened muscles. Contact your doctor or a physical therapist for more information on knee rehabilitation.   POST-OPERATIVE OPIOID TAPER INSTRUCTIONS: It is important to wean off of your opioid medication as soon as possible. If you do not need pain medication after your surgery it is ok to stop day one. Opioids include: Codeine, Hydrocodone(Norco, Vicodin), Oxycodone(Percocet, oxycontin) and hydromorphone amongst others.  Long term and even short term use of opiods can cause: Increased pain response Dependence Constipation Depression Respiratory depression And more.  Withdrawal  symptoms can include Flu like symptoms Nausea, vomiting And more Techniques to manage these symptoms Hydrate well Eat regular healthy meals Stay active Use relaxation techniques(deep breathing, meditating, yoga) Do Not substitute Alcohol to help with tapering If you have been on opioids for less than two weeks and do not have pain than it is ok to stop all together.  Plan to wean off of opioids This plan should start within one week post op of your  joint replacement. Maintain the same interval or time between taking each dose and first decrease the dose.  Cut the total daily intake of opioids by one tablet each day Next start to increase the time between doses. The last dose that should be eliminated is the evening dose.   IF YOU ARE TRANSFERRED TO A SKILLED REHAB FACILITY If the patient is transferred to a skilled rehab facility following release from the hospital, a list of the current medications will be sent to the facility for the patient to continue.  When discharged from the skilled rehab facility, please have the facility set up the patient's Home Health Physical Therapy prior to being released. Also, the skilled facility will be responsible for providing the patient with their medications at time of release from the facility to include their pain medication, the muscle relaxants, and their blood thinner medication. If the patient is still at the rehab facility at time of the two week follow up appointment, the skilled rehab facility will also need to assist the patient in arranging follow up appointment in our office and any transportation needs.  MAKE SURE YOU:  Understand these instructions.  Get help right away if you are not doing well or get worse.   DENTAL ANTIBIOTICS:  In most cases prophylactic antibiotics for Dental procdeures after total joint surgery are not necessary.  Exceptions are as follows:  1. History of prior total joint infection  2. Severely  immunocompromised (Organ Transplant, cancer chemotherapy, Rheumatoid biologic meds such as Humera)  3. Poorly controlled diabetes (A1C &gt; 8.0, blood glucose over 200)  If you have one of these conditions, contact your surgeon for an antibiotic prescription, prior to your dental procedure.    Pick up stool softner and laxative for home use following surgery while on pain medications. Do not submerge incision under water. Please use good hand washing techniques while changing dressing each day. May shower starting three days after surgery. Please use a clean towel to pat the incision dry following showers. Continue to use ice for pain and swelling after surgery. Do not use any lotions or creams on the incision until instructed by your surgeon.

## 2022-12-14 NOTE — Anesthesia Procedure Notes (Signed)
Procedure Name: MAC Date/Time: 12/14/2022 7:10 AM  Performed by: Elisabeth Cara, CRNAPre-anesthesia Checklist: Patient identified, Emergency Drugs available, Suction available, Patient being monitored and Timeout performed Patient Re-evaluated:Patient Re-evaluated prior to induction Oxygen Delivery Method: Simple face mask Placement Confirmation: positive ETCO2 Dental Injury: Teeth and Oropharynx as per pre-operative assessment

## 2022-12-14 NOTE — Anesthesia Procedure Notes (Signed)
Anesthesia Regional Block: Adductor canal block   Pre-Anesthetic Checklist: , timeout performed,  Correct Patient, Correct Site, Correct Laterality,  Correct Procedure, Correct Position, site marked,  Risks and benefits discussed,  Surgical consent,  Pre-op evaluation,  At surgeon's request and post-op pain management  Laterality: Right  Prep: chloraprep       Needles:  Injection technique: Single-shot  Needle Type: Echogenic Needle     Needle Length: 9cm  Needle Gauge: 21     Additional Needles:   Procedures:,,,, ultrasound used (permanent image in chart),,    Narrative:  Start time: 12/14/2022 6:51 AM End time: 12/14/2022 6:56 AM Injection made incrementally with aspirations every 5 mL.  Performed by: Personally  Anesthesiologist: Marcene Duos, MD

## 2022-12-14 NOTE — Interval H&P Note (Signed)
History and Physical Interval Note:  12/14/2022 6:30 AM  Kimberly Walsh  has presented today for surgery, with the diagnosis of right knee osteoarthritis.  The various methods of treatment have been discussed with the patient and family. After consideration of risks, benefits and other options for treatment, the patient has consented to  Procedure(s): TOTAL KNEE ARTHROPLASTY (Right) as a surgical intervention.  The patient's history has been reviewed, patient examined, no change in status, stable for surgery.  I have reviewed the patient's chart and labs.  Questions were answered to the patient's satisfaction.     Homero Fellers Lilienne Weins

## 2022-12-14 NOTE — Evaluation (Signed)
Physical Therapy Evaluation Patient Details Name: Kimberly Walsh MRN: [id number] DOB: Jul 20, 1945 Today's Date: 12/14/2022  History of Present Illness  77 yo female presents to therapy s/p R TKA on 12/14/2022 due to failure of conservative measures. Pt PMH includes but is not limited to: CVA, PVC, CKD III, B LE Edema, HTN,HDL, hypothyroidism, and vertigo.  Clinical Impression    Kimberly Walsh is a 77 y.o. female POD 0 s/p R TKA. Patient reports IND with mobility at baseline. Patient is now limited by functional impairments (see PT problem list below) and requires min guard for bed mobility and min guard and cues for transfers. Patient was able to ambulate 40 feet with RW and min guard level of assist. Patient instructed in exercise to facilitate ROM and circulation to manage edema. Patient will benefit from continued skilled PT interventions to address impairments and progress towards PLOF. Acute PT will follow to progress mobility and stair training in preparation for safe discharge home with family support and OPPT services.      Recommendations for follow up therapy are one component of a multi-disciplinary discharge planning process, led by the attending physician.  Recommendations may be updated based on patient status, additional functional criteria and insurance authorization.  Follow Up Recommendations       Assistance Recommended at Discharge Intermittent Supervision/Assistance  Patient can return home with the following  A little help with walking and/or transfers;A little help with bathing/dressing/bathroom;Assistance with cooking/housework;Assist for transportation;Help with stairs or ramp for entrance    Equipment Recommendations Rolling walker (2 wheels)  Recommendations for Other Services       Functional Status Assessment Patient has had a recent decline in their functional status and demonstrates the ability to make significant improvements in function in a reasonable and  predictable amount of time.     Precautions / Restrictions Precautions Precautions: Knee;Fall Restrictions Weight Bearing Restrictions: No      Mobility  Bed Mobility Overal bed mobility: Needs Assistance Bed Mobility: Supine to Sit     Supine to sit: Min guard, HOB elevated     General bed mobility comments: cues    Transfers Overall transfer level: Needs assistance Equipment used: Rolling walker (2 wheels) Transfers: Sit to/from Stand Sit to Stand: Min guard           General transfer comment: cues for proper UE placement    Ambulation/Gait Ambulation/Gait assistance: Min guard Gait Distance (Feet): 40 Feet Assistive device: Rolling walker (2 wheels) Gait Pattern/deviations: Step-to pattern, Antalgic Gait velocity: decreased        Stairs            Wheelchair Mobility    Modified Rankin (Stroke Patients Only)       Balance Overall balance assessment: Needs assistance Sitting-balance support: Feet supported Sitting balance-Leahy Scale: Good     Standing balance support: Bilateral upper extremity supported, During functional activity, Reliant on assistive device for balance Standing balance-Leahy Scale: Poor                               Pertinent Vitals/Pain Pain Assessment Pain Assessment: 0-10 Pain Score: 7  Pain Location: R knee Pain Descriptors / Indicators: Aching, Discomfort, Operative site guarding Pain Intervention(s): Limited activity within patient's tolerance, Monitored during session, Premedicated before session, Repositioned, Ice applied    Home Living Family/patient expects to be discharged to:: Private residence Living Arrangements: Alone Available Help at Discharge: Family Type of  Home: House Home Access: Stairs to enter Entrance Stairs-Rails: None Entrance Stairs-Number of Steps: 1   Home Layout: One level Home Equipment: Cane - single point;Cane - quad (walking stick)      Prior Function Prior  Level of Function : Independent/Modified Independent             Mobility Comments: IND with all ADLs, self care tasks, IADLs, driving       Hand Dominance        Extremity/Trunk Assessment        Lower Extremity Assessment Lower Extremity Assessment: RLE deficits/detail RLE Deficits / Details: ankle DF/PF 5/5; SLR < 10 degree lag RLE Sensation: WNL    Cervical / Trunk Assessment Cervical / Trunk Assessment:  (wfl)  Communication   Communication: No difficulties (increased volume)  Cognition Arousal/Alertness: Awake/alert Behavior During Therapy: WFL for tasks assessed/performed Overall Cognitive Status: Within Functional Limits for tasks assessed                                          General Comments      Exercises Total Joint Exercises Ankle Circles/Pumps: AROM, Both, 20 reps   Assessment/Plan    PT Assessment Patient needs continued PT services  PT Problem List Decreased strength;Decreased range of motion;Decreased activity tolerance;Decreased balance;Decreased mobility;Decreased coordination;Decreased cognition;Pain       PT Treatment Interventions DME instruction;Gait training;Stair training;Functional mobility training;Therapeutic activities;Therapeutic exercise;Balance training;Neuromuscular re-education;Patient/family education;Modalities    PT Goals (Current goals can be found in the Care Plan section)  Acute Rehab PT Goals Patient Stated Goal: to be able to walk better and navigate the bleachers PT Goal Formulation: With patient/family Time For Goal Achievement: 12/28/22 Potential to Achieve Goals: Good    Frequency 7X/week     Co-evaluation               AM-PAC PT "6 Clicks" Mobility  Outcome Measure Help needed turning from your back to your side while in a flat bed without using bedrails?: A Little Help needed moving from lying on your back to sitting on the side of a flat bed without using bedrails?: A  Little Help needed moving to and from a bed to a chair (including a wheelchair)?: A Little Help needed standing up from a chair using your arms (e.g., wheelchair or bedside chair)?: A Little Help needed to walk in hospital room?: A Little Help needed climbing 3-5 steps with a railing? : A Lot 6 Click Score: 17    End of Session Equipment Utilized During Treatment: Gait belt Activity Tolerance: Patient tolerated treatment well Patient left: in chair;with call bell/phone within reach;with family/visitor present Nurse Communication: Mobility status;Patient requests pain meds (pain medication administered during eval) PT Visit Diagnosis: Unsteadiness on feet (R26.81);Other abnormalities of gait and mobility (R26.89);Muscle weakness (generalized) (M62.81);Difficulty in walking, not elsewhere classified (R26.2);Pain Pain - Right/Left: Right Pain - part of body: Knee    Time: 1331-1404 PT Time Calculation (min) (ACUTE ONLY): 33 min   Charges:   PT Evaluation $PT Eval Low Complexity: 1 Low PT Treatments $Gait Training: 8-22 mins         Johnny Bridge, PT Acute Rehab   Jacqualyn Posey 12/14/2022, 4:33 PM

## 2022-12-14 NOTE — Transfer of Care (Signed)
Immediate Anesthesia Transfer of Care Note  Patient: Kimberly Walsh  Procedure(s) Performed: TOTAL KNEE ARTHROPLASTY (Right: Knee)  Patient Location: PACU  Anesthesia Type:MAC and Spinal  Level of Consciousness: awake, alert , oriented, and patient cooperative  Airway & Oxygen Therapy: Patient Spontanous Breathing and Patient connected to face mask oxygen  Post-op Assessment: Report given to RN and Post -op Vital signs reviewed and stable  Post vital signs: Reviewed and stable  Last Vitals:  Vitals Value Taken Time  BP 133/57 12/14/22 0843  Temp    Pulse 89 12/14/22 0844  Resp 19 12/14/22 0844  SpO2 100 % 12/14/22 0844  Vitals shown include unvalidated device data.  Last Pain:  Vitals:   12/14/22 0635  TempSrc: Oral  PainSc:          Complications: No notable events documented.

## 2022-12-14 NOTE — Anesthesia Postprocedure Evaluation (Signed)
Anesthesia Post Note  Patient: Kimberly Walsh  Procedure(s) Performed: TOTAL KNEE ARTHROPLASTY (Right: Knee)     Patient location during evaluation: PACU Anesthesia Type: Spinal Level of consciousness: awake and alert Pain management: pain level controlled Vital Signs Assessment: post-procedure vital signs reviewed and stable Respiratory status: spontaneous breathing and respiratory function stable Cardiovascular status: blood pressure returned to baseline and stable Postop Assessment: spinal receding Anesthetic complications: no  No notable events documented.  Last Vitals:  Vitals:   12/14/22 1056 12/14/22 1304  BP: 120/67 (!) 122/59  Pulse: 76 87  Resp: 16 17  Temp: (!) 36.4 C 36.8 C  SpO2: 97% 100%    Last Pain:  Vitals:   12/14/22 1343  TempSrc:   PainSc: 7                  Kennieth Rad

## 2022-12-14 NOTE — Progress Notes (Signed)
Orthopedic Tech Progress Note Patient Details:  Kimberly Walsh 1946/04/25 [id number]  CPM Right Knee CPM Right Knee: Off Right Knee Flexion (Degrees): 40 Right Knee Extension (Degrees): 10  Post Interventions Patient Tolerated: Well  Darleen Crocker 12/14/2022, 1:18 PM

## 2022-12-15 DIAGNOSIS — I129 Hypertensive chronic kidney disease with stage 1 through stage 4 chronic kidney disease, or unspecified chronic kidney disease: Secondary | ICD-10-CM | POA: Diagnosis not present

## 2022-12-15 DIAGNOSIS — Z8673 Personal history of transient ischemic attack (TIA), and cerebral infarction without residual deficits: Secondary | ICD-10-CM | POA: Diagnosis not present

## 2022-12-15 DIAGNOSIS — Z85828 Personal history of other malignant neoplasm of skin: Secondary | ICD-10-CM | POA: Diagnosis not present

## 2022-12-15 DIAGNOSIS — E039 Hypothyroidism, unspecified: Secondary | ICD-10-CM | POA: Diagnosis not present

## 2022-12-15 DIAGNOSIS — M1711 Unilateral primary osteoarthritis, right knee: Secondary | ICD-10-CM | POA: Diagnosis not present

## 2022-12-15 DIAGNOSIS — N183 Chronic kidney disease, stage 3 unspecified: Secondary | ICD-10-CM | POA: Diagnosis not present

## 2022-12-15 LAB — CBC
HCT: 35.5 % — ABNORMAL LOW (ref 36.0–46.0)
Hemoglobin: 11.5 g/dL — ABNORMAL LOW (ref 12.0–15.0)
MCH: 29.5 pg (ref 26.0–34.0)
MCHC: 32.4 g/dL (ref 30.0–36.0)
MCV: 91 fL (ref 80.0–100.0)
Platelets: 180 10*3/uL (ref 150–400)
RBC: 3.9 MIL/uL (ref 3.87–5.11)
RDW: 13.3 % (ref 11.5–15.5)
WBC: 13.4 10*3/uL — ABNORMAL HIGH (ref 4.0–10.5)
nRBC: 0 % (ref 0.0–0.2)

## 2022-12-15 LAB — BASIC METABOLIC PANEL
Anion gap: 6 (ref 5–15)
BUN: 18 mg/dL (ref 8–23)
CO2: 24 mmol/L (ref 22–32)
Calcium: 8.4 mg/dL — ABNORMAL LOW (ref 8.9–10.3)
Chloride: 106 mmol/L (ref 98–111)
Creatinine, Ser: 1.04 mg/dL — ABNORMAL HIGH (ref 0.44–1.00)
GFR, Estimated: 56 mL/min — ABNORMAL LOW (ref >60)
Glucose, Bld: 147 mg/dL — ABNORMAL HIGH (ref 70–99)
Potassium: 4.6 mmol/L (ref 3.5–5.1)
Sodium: 136 mmol/L (ref 135–145)

## 2022-12-15 MED ORDER — ONDANSETRON HCL 4 MG PO TABS: 4.0000 mg | ORAL_TABLET | Freq: Four times a day (QID) | ORAL | 0 refills | Status: AC | PRN

## 2022-12-15 MED ORDER — ASPIRIN 325 MG PO TBEC: 325.0000 mg | DELAYED_RELEASE_TABLET | Freq: Every day | ORAL | 0 refills | Status: AC

## 2022-12-15 MED ORDER — TRAMADOL HCL 50 MG PO TABS: 50.0000 mg | ORAL_TABLET | Freq: Four times a day (QID) | ORAL | 0 refills | Status: DC | PRN

## 2022-12-15 MED ORDER — METHOCARBAMOL 500 MG PO TABS: 500.0000 mg | ORAL_TABLET | Freq: Four times a day (QID) | ORAL | 0 refills | Status: DC | PRN

## 2022-12-15 MED ORDER — OXYCODONE HCL 5 MG PO TABS: 5.0000 mg | ORAL_TABLET | Freq: Three times a day (TID) | ORAL | 0 refills | Status: DC | PRN

## 2022-12-15 NOTE — TOC Transition Note (Signed)
Transition of Care [facility]) - CM/SW Discharge Note  Patient Details  Name: Kimberly Walsh MRN: [id number] Date of Birth: [birth year]  Transition of Care [facility]) CM/SW Contact:  Ewing Schlein, LCSW Phone Number: 12/15/2022, 11:16 AM  Clinical Narrative: Patient is expected to discharge home after working with PT. CSW met with patient to confirm discharge plan and needs. Patient will go home with OPPT at [facility]. Patient will need a rolling walker, which [employer] delivered to patient's room. TOC signing off.    Final next level of care: OP Rehab Barriers to Discharge: No Barriers Identified  Patient Goals and CMS Choice CMS Medicare.gov Compare Post Acute Care list provided to:: Patient Choice offered to / list presented to : Patient  Discharge Plan and Services Additional resources added to the After Visit Summary for         DME Arranged: Walker rolling DME Agency: [employer] Date DME Agency Contacted: 12/15/22 Representative spoke with at DME Agency: Yvonna Alanis  Social Determinants of Health (SDOH) Interventions SDOH Screenings   Food Insecurity: No Food Insecurity (12/14/2022)  Housing: Low Risk  (12/14/2022)  Transportation Needs: No Transportation Needs (12/14/2022)  Utilities: Not At Risk (12/14/2022)  Alcohol Screen: Low Risk  (06/24/2022)  Depression (PHQ2-9): Low Risk  (11/02/2022)  Financial Resource Strain: Low Risk  (06/24/2022)  Physical Activity: Insufficiently Active (06/24/2022)  Social Connections: Moderately Integrated (06/24/2022)  Stress: No Stress Concern Present (06/24/2022)  Tobacco Use: Low Risk  (12/14/2022)   Readmission Risk Interventions     No data to display

## 2022-12-15 NOTE — Plan of Care (Signed)

## 2022-12-15 NOTE — Care Management Obs Status (Signed)
MEDICARE OBSERVATION STATUS NOTIFICATION   Patient Details  Name: Kimberly Walsh MRN: [id number] Date of Birth: 20-Feb-1946   Medicare Observation Status Notification Given:       Ewing Schlein, [place] 12/15/2022, 9:38 AM

## 2022-12-15 NOTE — Progress Notes (Signed)
Physical Therapy Treatment Patient Details Name: Kimberly Walsh MRN: [id number] DOB: 1946/01/17 Today's Date: 12/15/2022   History of Present Illness 77 yo female presents to therapy s/p R TKA on 12/14/2022 due to failure of conservative measures. Pt PMH includes but is not limited to: CVA, PVC, CKD III, B LE Edema, HTN,HDL, hypothyroidism, and vertigo.    PT Comments    POD # 1 pm session Assisted with amb in hallway and completed remaining TE's. Addressed all mobility questions, discussed appropriate activity, educated on use of ICE.  Pt ready for D/C to home.   Recommendations for follow up therapy are one component of a multi-disciplinary discharge planning process, led by the attending physician.  Recommendations may be updated based on patient status, additional functional criteria and insurance authorization.  Follow Up Recommendations       Assistance Recommended at Discharge Intermittent Supervision/Assistance  Patient can return home with the following A little help with walking and/or transfers;A little help with bathing/dressing/bathroom;Assistance with cooking/housework;Assist for transportation;Help with stairs or ramp for entrance   Equipment Recommendations  Rolling walker (2 wheels)    Recommendations for Other Services       Precautions / Restrictions Precautions Precautions: Knee;Fall Precaution Comments: no pillow under knee Restrictions Weight Bearing Restrictions: No Other Position/Activity Restrictions: WBAT     Mobility  Bed Mobility               General bed mobility comments: OOB in recliner    Transfers Overall transfer level: Needs assistance Equipment used: Rolling walker (2 wheels) Transfers: Sit to/from Stand Sit to Stand: Supervision, Min guard           General transfer comment: cues for proper UE placement plus increased time    Ambulation/Gait Ambulation/Gait assistance: Supervision Gait Distance (Feet): 55  Feet Assistive device: Rolling walker (2 wheels) Gait Pattern/deviations: Step-to pattern, Antalgic, Decreased stance time - right Gait velocity: decreased     General Gait Details: VC's on proper sequencing plus safety with turns   Stairs Stairs: Yes Stairs assistance: Min guard, Supervision Stair Management: No rails, Step to pattern, Forwards, With walker Number of Stairs: 1 General stair comments: VC's on proper walker placement plus sequencing.   Wheelchair Mobility    Modified Rankin (Stroke Patients Only)       Balance                                            Cognition Arousal/Alertness: Awake/alert Behavior During Therapy: WFL for tasks assessed/performed Overall Cognitive Status: Within Functional Limits for tasks assessed                                 General Comments: AxO x 3 with intermittent confusion (pain meds/HOH) requiring some repeat Instructions.  Asking appropriate questions.  Pleasant.  Granddaughter present during session.        Exercises  05 reps all seated TKR TE's following HEP.    General Comments        Pertinent Vitals/Pain Pain Assessment Pain Assessment: 0-10 Pain Score: 5  Pain Location: R knee Pain Descriptors / Indicators: Aching, Discomfort, Operative site guarding Pain Intervention(s): Monitored during session, Premedicated before session, Repositioned, Ice applied    Home Living  Prior Function            PT Goals (current goals can now be found in the care plan section) Progress towards PT goals: Progressing toward goals    Frequency    7X/week      PT Plan Current plan remains appropriate    Co-evaluation              AM-PAC PT "6 Clicks" Mobility   Outcome Measure  Help needed turning from your back to your side while in a flat bed without using bedrails?: A Little Help needed moving from lying on your back to sitting on the  side of a flat bed without using bedrails?: A Little Help needed moving to and from a bed to a chair (including a wheelchair)?: A Little Help needed standing up from a chair using your arms (e.g., wheelchair or bedside chair)?: A Little Help needed to walk in hospital room?: A Little Help needed climbing 3-5 steps with a railing? : A Little 6 Click Score: 18    End of Session Equipment Utilized During Treatment: Gait belt Activity Tolerance: Patient tolerated treatment well Patient left: in chair;with call bell/phone within reach;with family/visitor present Nurse Communication: Mobility status;Patient requests pain meds PT Visit Diagnosis: Unsteadiness on feet (R26.81);Other abnormalities of gait and mobility (R26.89);Muscle weakness (generalized) (M62.81);Difficulty in walking, not elsewhere classified (R26.2);Pain Pain - Right/Left: Right Pain - part of body: Knee     Time: 1345-1410 PT Time Calculation (min) (ACUTE ONLY): 25 min  Charges:  $Gait Training: 8-22 mins $Therapeutic Exercise: 8-22 mins                    Felecia Shelling  PTA Acute  Rehabilitation Services Office M-F          [phone]

## 2022-12-15 NOTE — Progress Notes (Signed)
Physical Therapy Treatment Patient Details Name: Kimberly Walsh MRN: [id number] DOB: [birth year] Today's Date: 12/15/2022   History of Present Illness 77 yo female presents to therapy s/p R TKA on 12/14/2022 due to failure of conservative measures. Pt PMH includes but is not limited to: CVA, PVC, CKD III, B LE Edema, HTN,HDL, hypothyroidism, and vertigo.    PT Comments    POD # 1 am session General Comments: AxO x 3 with intermittent confusion (pain meds/HOH) requiring some repeat Instructions.  Asking appropriate questions.  Pleasant.  Granddaughter present during session. Assisted with amb in hallway and practice ONE step she has to enter home.  Then returned to room to perform some TE's following HEP handout.  Instructed on proper tech, freq as well as use of ICE.   Pt will need another PT session to complete HEP Education and repeat ONE step.     Recommendations for follow up therapy are one component of a multi-disciplinary discharge planning process, led by the attending physician.  Recommendations may be updated based on patient status, additional functional criteria and insurance authorization.  Follow Up Recommendations       Assistance Recommended at Discharge Intermittent Supervision/Assistance  Patient can return home with the following A little help with walking and/or transfers;A little help with bathing/dressing/bathroom;Assistance with cooking/housework;Assist for transportation;Help with stairs or ramp for entrance   Equipment Recommendations  Rolling walker (2 wheels)    Recommendations for Other Services       Precautions / Restrictions Precautions Precautions: Knee;Fall Precaution Comments: no pillow under knee Restrictions Weight Bearing Restrictions: No Other Position/Activity Restrictions: WBAT     Mobility  Bed Mobility               General bed mobility comments: OOB in recliner    Transfers Overall transfer level: Needs  assistance Equipment used: Rolling walker (2 wheels) Transfers: Sit to/from Stand Sit to Stand: Supervision, Min guard           General transfer comment: cues for proper UE placement plus increased time    Ambulation/Gait Ambulation/Gait assistance: Supervision, Min guard Gait Distance (Feet): 42 Feet Assistive device: Rolling walker (2 wheels) Gait Pattern/deviations: Step-to pattern, Antalgic, Decreased stance time - right Gait velocity: decreased     General Gait Details: VC's on proper sequencing plus safety with turns   Stairs Stairs: Yes Stairs assistance: Min assist Stair Management: No rails, Step to pattern, Forwards, With walker Number of Stairs: 1 General stair comments: VC's on proper walker placement plus sequencing.   Wheelchair Mobility    Modified Rankin (Stroke Patients Only)       Balance                                            Cognition Arousal/Alertness: Awake/alert Behavior During Therapy: WFL for tasks assessed/performed Overall Cognitive Status: Within Functional Limits for tasks assessed                                 General Comments: AxO x 3 with intermittent confusion (pain meds/HOH) requiring some repeat Instructions.  Asking appropriate questions.  Pleasant.  Granddaughter present during session.        Exercises   Total Knee Replacement TE's following HEP handout 10 reps B LE ankle pumps 05 reps towel squeezes 05 reps  knee presses 05 reps heel slides  05 reps SAQ's 05 reps SLR's 05 reps ABD Educated on use of gait belt to assist with TE's Followed by ICE    General Comments        Pertinent Vitals/Pain Pain Assessment Pain Assessment: 0-10 Pain Score: 5  Pain Location: R knee Pain Descriptors / Indicators: Aching, Discomfort, Operative site guarding Pain Intervention(s): Monitored during session, Premedicated before session, Repositioned, Ice applied    Home Living                           Prior Function            PT Goals (current goals can now be found in the care plan section) Progress towards PT goals: Progressing toward goals    Frequency    7X/week      PT Plan Current plan remains appropriate    Co-evaluation              AM-PAC PT "6 Clicks" Mobility   Outcome Measure  Help needed turning from your back to your side while in a flat bed without using bedrails?: A Little Help needed moving from lying on your back to sitting on the side of a flat bed without using bedrails?: A Little Help needed moving to and from a bed to a chair (including a wheelchair)?: A Little Help needed standing up from a chair using your arms (e.g., wheelchair or bedside chair)?: A Little Help needed to walk in hospital room?: A Little Help needed climbing 3-5 steps with a railing? : A Little 6 Click Score: 18    End of Session Equipment Utilized During Treatment: Gait belt Activity Tolerance: Patient tolerated treatment well Patient left: in chair;with call bell/phone within reach;with family/visitor present Nurse Communication: Mobility status;Patient requests pain meds PT Visit Diagnosis: Unsteadiness on feet (R26.81);Other abnormalities of gait and mobility (R26.89);Muscle weakness (generalized) (M62.81);Difficulty in walking, not elsewhere classified (R26.2);Pain Pain - Right/Left: Right Pain - part of body: Knee     Time: 9604-5409 PT Time Calculation (min) (ACUTE ONLY): 40 min  Charges:  $Gait Training: 8-22 mins $Therapeutic Exercise: 8-22 mins $Therapeutic Activity: 8-22 mins                     Felecia Shelling  PTA Acute  Rehabilitation Services Office M-F          [phone]

## 2022-12-15 NOTE — Progress Notes (Signed)
   Subjective: 1 Day Post-Op Procedure(s) (LRB): TOTAL KNEE ARTHROPLASTY (Right) Patient reports pain as mild.   Patient seen in rounds by Dr. Lequita Halt. Patient is well, and has had no acute complaints or problems No issues overnight. Denies chest pain, SOB, or calf pain. Foley catheter removed this AM.  We will continue therapy today, ambulated 40' yesterday.   Objective: Vital signs in last 24 hours: Temp:  [97.4 F (36.3 C)-98.2 F (36.8 C)] 97.8 F (36.6 C) (06/25 0521) Pulse Rate:  [58-89] 58 (06/25 0521) Resp:  [13-21] 17 (06/25 0521) BP: (107-138)/(49-89) 119/52 (06/25 0521) SpO2:  [94 %-100 %] 96 % (06/25 0521)  Intake/Output from previous day:  Intake/Output Summary (Last 24 hours) at 12/15/2022 0817 Last data filed at 12/15/2022 1191 Gross per 24 hour  Intake 1749.23 ml  Output 1400 ml  Net 349.23 ml     Intake/Output this shift: No intake/output data recorded.  Labs: Recent Labs    12/15/22 0333  HGB 11.5*   Recent Labs    12/15/22 0333  WBC 13.4*  RBC 3.90  HCT 35.5*  PLT 180   Recent Labs    12/15/22 0333  NA 136  K 4.6  CL 106  CO2 24  BUN 18  CREATININE 1.04*  GLUCOSE 147*  CALCIUM 8.4*   No results for input(s): "LABPT", "INR" in the last 72 hours.  Exam: General - Patient is Alert and Oriented Extremity - Neurologically intact Neurovascular intact Sensation intact distally Dorsiflexion/Plantar flexion intact Dressing - dressing C/D/I Motor Function - intact, moving foot and toes well on exam.   Past Medical History:  Diagnosis Date   Cancer (HCC) 2016   skin cancer   Hyperlipidemia    Hypertension    Hypothyroidism    PONV (postoperative nausea and vomiting)    Venous stasis    Vertigo     Assessment/Plan: 1 Day Post-Op Procedure(s) (LRB): TOTAL KNEE ARTHROPLASTY (Right) Principal Problem:   OA (osteoarthritis) of knee Active Problems:   Primary osteoarthritis of left knee   Primary osteoarthritis of right  knee  Estimated body mass index is 34.52 kg/m as calculated from the following:   Height as of this encounter: 5\' 6"  (1.676 m).   Weight as of this encounter: 97 kg. Advance diet Up with therapy D/C IV fluids   Patient's anticipated LOS is less than 2 midnights, meeting these requirements: - Lives within 1 hour of care - Has a competent adult at home to recover with post-op recover - NO history of  - Chronic pain requiring opioids  - Diabetes  - Coronary Artery Disease  - Heart failure  - Heart attack  - DVT/VTE  - Cardiac arrhythmia  - Respiratory Failure/COPD  - Renal failure  - Anemia  - Advanced Liver disease  DVT Prophylaxis - Aspirin and Plavix Weight bearing as tolerated. Continue therapy.  Plan is to go Home after hospital stay. Plan for discharge later today if progresses with therapy and meeting goals. Scheduled for OPPT at [facility]). Follow-up in the office in 2 weeks.  The PDMP database was reviewed today prior to any opioid medications being prescribed to this patient.  Arther Abbott, PA-C Orthopedic Surgery [phone] 12/15/2022, 8:17 AM

## 2022-12-16 ENCOUNTER — Encounter (HOSPITAL_COMMUNITY): Payer: Self-pay | Admitting: Orthopedic Surgery

## 2022-12-18 ENCOUNTER — Other Ambulatory Visit: Payer: Self-pay

## 2022-12-18 ENCOUNTER — Ambulatory Visit: Payer: Medicare Other | Attending: Orthopedic Surgery | Admitting: Physical Therapy

## 2022-12-18 DIAGNOSIS — R600 Localized edema: Secondary | ICD-10-CM | POA: Insufficient documentation

## 2022-12-18 DIAGNOSIS — M25661 Stiffness of right knee, not elsewhere classified: Secondary | ICD-10-CM | POA: Diagnosis not present

## 2022-12-18 DIAGNOSIS — M25561 Pain in right knee: Secondary | ICD-10-CM | POA: Diagnosis not present

## 2022-12-18 DIAGNOSIS — M6281 Muscle weakness (generalized): Secondary | ICD-10-CM | POA: Diagnosis not present

## 2022-12-18 DIAGNOSIS — G8929 Other chronic pain: Secondary | ICD-10-CM | POA: Insufficient documentation

## 2022-12-18 NOTE — Therapy (Signed)
OUTPATIENT PHYSICAL THERAPY LOWER EXTREMITY EVALUATION   Patient Name: Kimberly Walsh MRN: [id number] DOB:1946-03-03, 77 y.o., female Today's Date: 12/18/2022  END OF SESSION:  PT End of Session - 12/18/22 1341     Visit Number 1    Number of Visits 12    Date for PT Re-Evaluation 01/15/23    Authorization Type FOTO.    PT Start Time 1102    PT Stop Time 1153    PT Time Calculation (min) 51 min    Activity Tolerance Patient tolerated treatment well    Behavior During Therapy WFL for tasks assessed/performed             Past Medical History:  Diagnosis Date   Cancer (HCC) 2016   skin cancer   Hyperlipidemia    Hypertension    Hypothyroidism    PONV (postoperative nausea and vomiting)    Venous stasis    Vertigo    Past Surgical History:  Procedure Laterality Date   basal carcinoma rt leg     CHOLECYSTECTOMY     FINGER SURGERY Right    Ring finger   MIDDLE EAR SURGERY     Fungus   TOTAL KNEE ARTHROPLASTY Right 12/14/2022   Procedure: TOTAL KNEE ARTHROPLASTY;  Surgeon: Ollen Gross, MD;  Location: [facility] ORS;  Service: Orthopedics;  Laterality: Right;   TUBAL LIGATION     VEIN SURGERY     Patient Active Problem List   Diagnosis Date Noted   Primary osteoarthritis of left knee 12/14/2022   Primary osteoarthritis of right knee 12/14/2022   Completed stroke (HCC) 09/22/2019   PVC (premature ventricular contraction) 09/22/2019   Late effect of cerebrovascular accident (CVA) 04/20/2019   CKD (chronic kidney disease) stage 3, GFR 30-59 ml/min (HCC) 03/07/2018   Peripheral edema 09/15/2017   BMI 37.0-37.9, adult 09/28/2016   Mixed hyperlipidemia 05/05/2016   Essential hypertension, benign 03/01/2014   Hypothyroidism 03/01/2014   OA (osteoarthritis) of knee 04/25/2010    REFERRING PROVIDER: Ollen Gross MD  REFERRING DIAG: Right total knee arthroplasty  THERAPY DIAG:  Chronic pain of right knee  Stiffness of right knee, not elsewhere  classified  Localized edema  Muscle weakness (generalized)  Rationale for Evaluation and Treatment: Rehabilitation  ONSET DATE: 12/14/22 (surgery date).  SUBJECTIVE:   SUBJECTIVE STATEMENT: The patient presents to the clinic s/p right total knee replacement performed on 12/14/22.  She was provided with an HEP from the hospital and she was encouraged to be compliant to it.  Her resting pain -level is a 6/10 today and increases with movement.  Rest and ice decrease pain.  She is using a walker for safe ambulation.  Her Aquacel is intact and she is wearing high high TED hose.  PERTINENT HISTORY: HTN, hypothyroidism.   PAIN:  Are you having pain? Yes: NPRS scale: 6/10 Pain location: Right knee. Pain description: Ache, throb, sore. Aggravating factors: As above. Relieving factors: as above.  PRECAUTIONS: Other: No ultrasound.  WEIGHT BEARING RESTRICTIONS: No  FALLS:  Has patient fallen in last 6 months? No  LIVING ENVIRONMENT: Stairs: One step. Has following equipment at home: Dan Humphreys - 2 wheeled  OCCUPATION: Retired.  PLOF: Independent with basic ADLs  PATIENT GOALS: Not have knee pain.   OBJECTIVE:   EDEMA:  Circumferential: RT knee 7 cms > LT.  PALPATION: C/o diffuse right knee pain currently.  LOWER EXTREMITY ROM:  In supine:  Right knee extension to -18 degrees and passive to -14 degrees and flexed measured  from seated position is 70 degrees.  LOWER EXTREMITY MMT:  Right hip flexion and  knee extension is 3 to 3+/5.  GAIT: Step-to gait pattern with a FWW.  TRANSFERS:  Sit to supine to sit with supervision.  TODAY'S TREATMENT:                                                                                                                              DATE: Nustep level 1 x 5 minutes f/b vasopneumatic to patient's right knee on low with bilateral LE elevation.      PATIENT EDUCATION:  Education details: Discussed compliance to HEP that she was given to in  the hospital. Person educated: Patient and Child(ren) Education method: Explanation Education comprehension: verbalized understanding  HOME EXERCISE PROGRAM:   ASSESSMENT:  CLINICAL IMPRESSION: The patient presents to OPPT s/p right total knee replacement performed on 12/14/22.  She is currently lacking right knee flexion and extension. Encouraged patient to be compliant to her HEP.  She is able to perform an antigravity SLR and SAQ.  She demonstrates moderate+ edema currently.  Her Aquacel is intact and sh is wearing knee high TED hose.  She is walking safely with a FWW with a step-to gait pattern. Patient will benefit from skilled physical therapy intervention to address pain and deficits.   OBJECTIVE IMPAIRMENTS: Abnormal gait, decreased activity tolerance, decreased ROM, decreased strength, increased edema, and pain.   ACTIVITY LIMITATIONS: carrying, lifting, bending, standing, stairs, bathing, and locomotion level  PARTICIPATION LIMITATIONS: meal prep, cleaning, and laundry  PERSONAL FACTORS: Time since onset of injury/illness/exacerbation are also affecting patient's functional outcome.   REHAB POTENTIAL: Excellent  CLINICAL DECISION MAKING: Stable/uncomplicated  EVALUATION COMPLEXITY: Low   GOALS:  SHORT TERM GOALS: Target date: 01/01/23.  Ind with an initial HEP. Goal status: INITIAL  2.  Full right knee extension.  Goal status: INITIAL  3.  Active right knee flexion to 90 degrees+.  Goal status: INITIAL   LONG TERM GOALS: Target date: 01/15/23  Ind with an advanced HEP.  Goal status: INITIAL  2.  Active right knee flexion to 115 degrees+ so the patient can perform functional tasks and do so with pain not > 2-3/10.  Goal status: INITIAL  3.  Increase right hip and knee strength to a solid 4+/5 to provide good stability for accomplishment of functional activities.  Goal status: INITIAL  4.  Perform a reciprocating stair gait with one railing with pain not >  2-3/10.  Goal status: INITIAL  PLAN:  PT FREQUENCY:  2-3 times a week  PT DURATION: 4 weeks  PLANNED INTERVENTIONS: Therapeutic exercises, Therapeutic activity, Neuromuscular re-education, Gait training, Patient/Family education, Self Care, Stair training, Electrical stimulation, Cryotherapy, Moist heat, Vasopneumatic device, and Manual therapy  PLAN FOR NEXT SESSION: Nustep, PROM, TKA protocol progression.  LE elevation and vasopneumatic.   Nalla Purdy, [place], PT 12/18/2022, 2:08 PM

## 2022-12-21 NOTE — Discharge Summary (Signed)
Patient ID: [name] MRN: [id number] DOB/AGE: Jul 29, 1945 77 y.o.  Admit date: 12/14/2022 Discharge date: 12/15/2022  Admission Diagnoses:  Principal Problem:   OA (osteoarthritis) of knee Active Problems:   Primary osteoarthritis of left knee   Primary osteoarthritis of right knee   Discharge Diagnoses:  Same  Past Medical History:  Diagnosis Date   Cancer (HCC) 2016   skin cancer   Hyperlipidemia    Hypertension    Hypothyroidism    PONV (postoperative nausea and vomiting)    Venous stasis    Vertigo     Surgeries: Procedure(s): TOTAL KNEE ARTHROPLASTY on 12/14/2022   Consultants:   Discharged Condition: Improved  Hospital Course: Kimberly Walsh is an 77 y.o. female who was admitted 12/14/2022 for operative treatment ofOA (osteoarthritis) of knee. Patient has severe unremitting pain that affects sleep, daily activities, and work/hobbies. After pre-op clearance the patient was taken to the operating room on 12/14/2022 and underwent  Procedure(s): TOTAL KNEE ARTHROPLASTY.    Patient was given perioperative antibiotics:  Anti-infectives (From admission, onward)    Start     Dose/Rate Route Frequency Ordered Stop   12/14/22 1400  ceFAZolin (ANCEF) IVPB 2g/100 mL premix        2 g 200 mL/hr over 30 Minutes Intravenous Every 6 hours 12/14/22 1057 12/15/22 1418   12/14/22 0600  ceFAZolin (ANCEF) IVPB 2g/100 mL premix        2 g 200 mL/hr over 30 Minutes Intravenous On call to O.R. 12/14/22 0541 12/14/22 0747   12/14/22 0546  ceFAZolin (ANCEF) 2-4 GM/100ML-% IVPB       Note to Pharmacy: Richardean Sale M: cabinet override      12/14/22 0546 12/14/22 4098        Patient was given sequential compression devices, early ambulation, and chemoprophylaxis to prevent DVT.  Patient benefited maximally from hospital stay and there were no complications.    Recent vital signs: No data found.   Recent laboratory studies: No results for input(s): "WBC", "HGB", "HCT",  "PLT", "NA", "K", "CL", "CO2", "BUN", "CREATININE", "GLUCOSE", "INR", "CALCIUM" in the last 72 hours.  Invalid input(s): "PT", "2"   Discharge Medications:   Allergies as of 12/15/2022       Reactions   Ace Inhibitors Cough        Medication List     STOP taking these medications    cyclobenzaprine 10 MG tablet Commonly known as: FLEXERIL   scopolamine 1 MG/3DAYS Commonly known as: TRANSDERM-SCOP       TAKE these medications    acetaminophen 500 MG tablet Commonly known as: TYLENOL Take 1,000 mg by mouth every 8 (eight) hours as needed for moderate pain.   aspirin EC 325 MG tablet Take 1 tablet (325 mg total) by mouth daily for 20 days. Then take one 81 mg aspirin once a day for three weeks. Then discontinue aspirin.   atorvastatin 40 MG tablet Commonly known as: LIPITOR Take 1 tablet (40 mg total) by mouth daily.   clopidogrel 75 MG tablet Commonly known as: PLAVIX Take 1 tablet (75 mg total) by mouth daily.   furosemide 40 MG tablet Commonly known as: LASIX Take 1 tablet (40 mg total) by mouth daily.   levothyroxine 50 MCG tablet Commonly known as: SYNTHROID Take 1 tablet (50 mcg total) by mouth daily.   losartan 25 MG tablet Commonly known as: COZAAR Take 1 tablet (25 mg total) by mouth daily.   meclizine 25 MG tablet Commonly known as:  ANTIVERT Take 1 tablet by mouth every 6 hours as need for vertigo   methocarbamol 500 MG tablet Commonly known as: ROBAXIN Take 1 tablet (500 mg total) by mouth every 6 (six) hours as needed for muscle spasms.   ondansetron 4 MG tablet Commonly known as: ZOFRAN Take 1 tablet (4 mg total) by mouth every 6 (six) hours as needed for nausea.   oxyCODONE 5 MG immediate release tablet Commonly known as: Oxy IR/ROXICODONE Take 1-2 tablets (5-10 mg total) by mouth every 8 (eight) hours as needed for severe pain.   traMADol 50 MG tablet Commonly known as: ULTRAM Take 1-2 tablets (50-100 mg total) by mouth every 6  (six) hours as needed for moderate pain.               Discharge Care Instructions  (From admission, onward)           Start     Ordered   12/15/22 0000  Weight bearing as tolerated        12/15/22 0819   12/15/22 0000  Change dressing       Comments: You may remove the bulky bandage (ACE wrap and gauze) two days after surgery. You will have an adhesive waterproof bandage underneath. Leave this in place until your first follow-up appointment.   12/15/22 0819            Diagnostic Studies: No results found.  Disposition: Discharge disposition: 01-Home or Self Care       Discharge Instructions     Call MD / Call 911   Complete by: As directed    If you experience chest pain or shortness of breath, CALL 911 and be transported to the hospital emergency room.  If you develope a fever above 101 F, pus (white drainage) or increased drainage or redness at the wound, or calf pain, call your surgeon's office.   Change dressing   Complete by: As directed    You may remove the bulky bandage (ACE wrap and gauze) two days after surgery. You will have an adhesive waterproof bandage underneath. Leave this in place until your first follow-up appointment.   Constipation Prevention   Complete by: As directed    Drink plenty of fluids.  Prune juice may be helpful.  You may use a stool softener, such as Colace (over the counter) 100 mg twice a day.  Use MiraLax (over the counter) for constipation as needed.   Diet - low sodium heart healthy   Complete by: As directed    Do not put a pillow under the knee. Place it under the heel.   Complete by: As directed    Driving restrictions   Complete by: As directed    No driving for two weeks   Post-operative opioid taper instructions:   Complete by: As directed    POST-OPERATIVE OPIOID TAPER INSTRUCTIONS: It is important to wean off of your opioid medication as soon as possible. If you do not need pain medication after your surgery it  is ok to stop day one. Opioids include: Codeine, Hydrocodone(Norco, Vicodin), Oxycodone(Percocet, oxycontin) and hydromorphone amongst others.  Long term and even short term use of opiods can cause: Increased pain response Dependence Constipation Depression Respiratory depression And more.  Withdrawal symptoms can include Flu like symptoms Nausea, vomiting And more Techniques to manage these symptoms Hydrate well Eat regular healthy meals Stay active Use relaxation techniques(deep breathing, meditating, yoga) Do Not substitute Alcohol to help with tapering If you have  been on opioids for less than two weeks and do not have pain than it is ok to stop all together.  Plan to wean off of opioids This plan should start within one week post op of your joint replacement. Maintain the same interval or time between taking each dose and first decrease the dose.  Cut the total daily intake of opioids by one tablet each day Next start to increase the time between doses. The last dose that should be eliminated is the evening dose.      TED hose   Complete by: As directed    Use stockings (TED hose) for three weeks on both leg(s).  You may remove them at night for sleeping.   Weight bearing as tolerated   Complete by: As directed         Follow-up Information     Ollen Gross, MD. Go on 12/29/2022.   Specialty: Orthopedic Surgery Why: You are scheduled for first post op appt on Tuesday July 9 at 2:00pm. Contact information: [address] [place] [address] [phone]                  Signed: Arther Abbott 12/21/2022, 7:25 AM

## 2022-12-22 ENCOUNTER — Ambulatory Visit: Payer: Medicare Other | Attending: Orthopedic Surgery | Admitting: Physical Therapy

## 2022-12-22 ENCOUNTER — Encounter: Payer: Self-pay | Admitting: Physical Therapy

## 2022-12-22 DIAGNOSIS — M25561 Pain in right knee: Secondary | ICD-10-CM | POA: Diagnosis not present

## 2022-12-22 DIAGNOSIS — R600 Localized edema: Secondary | ICD-10-CM | POA: Insufficient documentation

## 2022-12-22 DIAGNOSIS — G8929 Other chronic pain: Secondary | ICD-10-CM | POA: Insufficient documentation

## 2022-12-22 DIAGNOSIS — M25661 Stiffness of right knee, not elsewhere classified: Secondary | ICD-10-CM | POA: Insufficient documentation

## 2022-12-22 DIAGNOSIS — M6281 Muscle weakness (generalized): Secondary | ICD-10-CM | POA: Insufficient documentation

## 2022-12-22 NOTE — Therapy (Signed)
OUTPATIENT PHYSICAL THERAPY LOWER EXTREMITY TREATMENT   Patient Name: Kimberly Walsh MRN: [id number] DOB:12/24/1945, 77 y.o., female Today's Date: 12/22/2022  END OF SESSION:  PT End of Session - 12/22/22 1433     Visit Number 2    Number of Visits 12    Date for PT Re-Evaluation 01/15/23    Authorization Type FOTO.    PT Start Time 1431    PT Stop Time 1527    PT Time Calculation (min) 56 min    Activity Tolerance Patient tolerated treatment well    Behavior During Therapy WFL for tasks assessed/performed            Past Medical History:  Diagnosis Date   Cancer (HCC) 2016   skin cancer   Hyperlipidemia    Hypertension    Hypothyroidism    PONV (postoperative nausea and vomiting)    Venous stasis    Vertigo    Past Surgical History:  Procedure Laterality Date   basal carcinoma rt leg     CHOLECYSTECTOMY     FINGER SURGERY Right    Ring finger   MIDDLE EAR SURGERY     Fungus   TOTAL KNEE ARTHROPLASTY Right 12/14/2022   Procedure: TOTAL KNEE ARTHROPLASTY;  Surgeon: Ollen Gross, MD;  Location: [facility];  Service: Orthopedics;  Laterality: Right;   TUBAL LIGATION     VEIN SURGERY     Patient Active Problem List   Diagnosis Date Noted   Primary osteoarthritis of left knee 12/14/2022   Primary osteoarthritis of right knee 12/14/2022   Completed stroke (HCC) 09/22/2019   PVC (premature ventricular contraction) 09/22/2019   Late effect of cerebrovascular accident (CVA) 04/20/2019   CKD (chronic kidney disease) stage 3, GFR 30-59 ml/min (HCC) 03/07/2018   Peripheral edema 09/15/2017   BMI 37.0-37.9, adult 09/28/2016   Mixed hyperlipidemia 05/05/2016   Essential hypertension, benign 03/01/2014   Hypothyroidism 03/01/2014   OA (osteoarthritis) of knee 04/25/2010   REFERRING PROVIDER: Ollen Gross MD  REFERRING DIAG: Right total knee arthroplasty  THERAPY DIAG:  Chronic pain of right knee  Stiffness of right knee, not elsewhere classified  Localized  edema  Muscle weakness (generalized)  Rationale for Evaluation and Treatment: Rehabilitation  ONSET DATE: 12/14/22 (surgery date).  SUBJECTIVE:   SUBJECTIVE STATEMENT: Reports she has been compliant with hospital HEP.  PERTINENT HISTORY: HTN, hypothyroidism.    PAIN:  Are you having pain? Yes: NPRS scale: 4/10 Pain location: Right knee. Pain description: Ache, throb, sore. Aggravating factors: As above. Relieving factors: as above.  PRECAUTIONS: Other: No ultrasound.  LIVING ENVIRONMENT: Stairs: One step. Has following equipment at home: Dan Humphreys - 2 wheeled  PATIENT GOALS: Not have knee pain.  NEXT MD VISIT:  12/29/2022  OBJECTIVE:   EDEMA:  Circumferential: RT knee 7 cms > LT.  PALPATION: Tone palpable of R HS, calf  LOWER EXTREMITY ROM:    Active  Right AROM  Right AROM 12/22/22  Hip flexion    Hip extension    Hip abduction    Hip adduction    Hip internal rotation    Hip external rotation    Knee flexion 70 72  Knee extension -18 -18  Ankle dorsiflexion    Ankle plantarflexion    Ankle inversion    Ankle eversion     (Blank rows = not tested)   LOWER EXTREMITY MMT: Right hip flexion and  knee extension is 3 to 3+/5.   GAIT: Step-to gait pattern with a FWW.  TRANSFERS:  Sit to supine to sit with supervision.  TODAY'S TREATMENT:                                                                                                                              DATE: 12/22/22 EXERCISE LOG  Exercise Repetitions and Resistance Comments  Nustep L4, seat 11-10 x15 min   Rockerboard X2 min   Lunges 6" step x20 reps   Hip flexion to step 6" step x20 reps Practice proper hip flexion  SAQ X20 reps AROM   Heel slides X20 reps   Heel prop for extension X3 min    Blank cell = exercise not performed today   Modalities  Date: 12/22/22 Vaso: Knee, Low, 10 mins, Pain and Edema  PATIENT EDUCATION:  Education details: Discussed compliance to HEP that she was given  to in the hospital. Person educated: Patient and Child(ren) Education method: Explanation Education comprehension: verbalized understanding  HOME EXERCISE PROGRAM:  ASSESSMENT:  CLINICAL IMPRESSION: Patient presented in clinic with reports mod R knee pain and soreness. Patient reports compliance with hospital provided HEP. Patient guarded with both hip and knee ROM due to pain and increased edema notable throughout RLE. Patient guided through light ROM and quad activation techniques with reports of pain with calf stretching as well as knee flexion. Hip flexion to step completed in order to enhance knee and hip flexion and avoid hip circumduction. Fair quad activation noted with SAQ. Patient encouraged to continue elevation under R ankle as well as icing and HEP. Normal vasopneumatic response noted following removal of the modality.  OBJECTIVE IMPAIRMENTS: Abnormal gait, decreased activity tolerance, decreased ROM, decreased strength, increased edema, and pain.   ACTIVITY LIMITATIONS: carrying, lifting, bending, standing, stairs, bathing, and locomotion level  PARTICIPATION LIMITATIONS: meal prep, cleaning, and laundry  PERSONAL FACTORS: Time since onset of injury/illness/exacerbation are also affecting patient's functional outcome.   REHAB POTENTIAL: Excellent  CLINICAL DECISION MAKING: Stable/uncomplicated  EVALUATION COMPLEXITY: Low  GOALS:  SHORT TERM GOALS: Target date: 01/01/23.  Ind with an initial HEP. Goal status: INITIAL  2.  Full right knee extension.  Goal status: INITIAL  3.  Active right knee flexion to 90 degrees+.  Goal status: INITIAL  LONG TERM GOALS: Target date: 01/15/23  Ind with an advanced HEP.  Goal status: INITIAL  2.  Active right knee flexion to 115 degrees+ so the patient can perform functional tasks and do so with pain not > 2-3/10.  Goal status: INITIAL  3.  Increase right hip and knee strength to a solid 4+/5 to provide good stability for  accomplishment of functional activities.  Goal status: INITIAL  4.  Perform a reciprocating stair gait with one railing with pain not > 2-3/10.  Goal status: INITIAL  PLAN:  PT FREQUENCY:  2-3 times a week  PT DURATION: 4 weeks  PLANNED INTERVENTIONS: Therapeutic exercises, Therapeutic activity, Neuromuscular re-education, Gait training, Patient/Family education, Self Care, Stair training, Lobbyist  stimulation, Cryotherapy, Moist heat, Vasopneumatic device, and Manual therapy  PLAN FOR NEXT SESSION: Nustep, PROM, TKA protocol progression.  LE elevation and vasopneumatic.  Marvell Fuller, PTA 12/22/2022, 3:40 PM

## 2022-12-25 ENCOUNTER — Encounter: Payer: Self-pay | Admitting: Physical Therapy

## 2022-12-25 ENCOUNTER — Ambulatory Visit: Payer: Medicare Other | Admitting: Physical Therapy

## 2022-12-25 DIAGNOSIS — M25661 Stiffness of right knee, not elsewhere classified: Secondary | ICD-10-CM | POA: Diagnosis not present

## 2022-12-25 DIAGNOSIS — R600 Localized edema: Secondary | ICD-10-CM

## 2022-12-25 DIAGNOSIS — M6281 Muscle weakness (generalized): Secondary | ICD-10-CM

## 2022-12-25 DIAGNOSIS — G8929 Other chronic pain: Secondary | ICD-10-CM

## 2022-12-25 DIAGNOSIS — M25561 Pain in right knee: Secondary | ICD-10-CM | POA: Diagnosis not present

## 2022-12-25 NOTE — Therapy (Addendum)
OUTPATIENT PHYSICAL THERAPY LOWER EXTREMITY TREATMENT   Patient Name: Kimberly Walsh MRN: [id number] DOB:Apr 20, 1946, 77 y.o., female Today's Date: 12/25/2022  END OF SESSION:  PT End of Session - 12/25/22 1201     Visit Number 3    Number of Visits 12    Date for PT Re-Evaluation 01/15/23    Authorization Type FOTO.    PT Start Time 1148    PT Stop Time 1239    PT Time Calculation (min) 51 min    Activity Tolerance Patient tolerated treatment well    Behavior During Therapy WFL for tasks assessed/performed            Past Medical History:  Diagnosis Date   Cancer (HCC) 2016   skin cancer   Hyperlipidemia    Hypertension    Hypothyroidism    PONV (postoperative nausea and vomiting)    Venous stasis    Vertigo    Past Surgical History:  Procedure Laterality Date   basal carcinoma rt leg     CHOLECYSTECTOMY     FINGER SURGERY Right    Ring finger   MIDDLE EAR SURGERY     Fungus   TOTAL KNEE ARTHROPLASTY Right 12/14/2022   Procedure: TOTAL KNEE ARTHROPLASTY;  Surgeon: Ollen Gross, MD;  Location: [facility];  Service: Orthopedics;  Laterality: Right;   TUBAL LIGATION     VEIN SURGERY     Patient Active Problem List   Diagnosis Date Noted   Primary osteoarthritis of left knee 12/14/2022   Primary osteoarthritis of right knee 12/14/2022   Completed stroke (HCC) 09/22/2019   PVC (premature ventricular contraction) 09/22/2019   Late effect of cerebrovascular accident (CVA) 04/20/2019   CKD (chronic kidney disease) stage 3, GFR 30-59 ml/min (HCC) 03/07/2018   Peripheral edema 09/15/2017   BMI 37.0-37.9, adult 09/28/2016   Mixed hyperlipidemia 05/05/2016   Essential hypertension, benign 03/01/2014   Hypothyroidism 03/01/2014   OA (osteoarthritis) of knee 04/25/2010   REFERRING PROVIDER: Ollen Gross MD  REFERRING DIAG: Right total knee arthroplasty  THERAPY DIAG:  Chronic pain of right knee  Stiffness of right knee, not elsewhere classified  Localized  edema  Muscle weakness (generalized)  Rationale for Evaluation and Treatment: Rehabilitation  ONSET DATE: 12/14/22 (surgery date).  SUBJECTIVE:   SUBJECTIVE STATEMENT: Reports she was very sore after last treatment. Has had a lot of heel soreness as well. Has been nauseated with the pain.  PERTINENT HISTORY: HTN, hypothyroidism.    PAIN:  Are you having pain? Yes: NPRS scale: 7/10 Pain location: Right knee. Pain description: Sore. Aggravating factors: As above. Relieving factors: as above.  PRECAUTIONS: Other: No ultrasound.  LIVING ENVIRONMENT: Stairs: One step. Has following equipment at home: Dan Humphreys - 2 wheeled  PATIENT GOALS: Not have knee pain.  NEXT MD VISIT:  12/29/2022  OBJECTIVE:   EDEMA:  Circumferential: RT knee 7 cms > LT.  PALPATION: Tone palpable of R HS, calf  LOWER EXTREMITY ROM:    Active  Right AROM  Right AROM 12/22/22  Hip flexion    Hip extension    Hip abduction    Hip adduction    Hip internal rotation    Hip external rotation    Knee flexion 70 72  Knee extension -18 -18  Ankle dorsiflexion    Ankle plantarflexion    Ankle inversion    Ankle eversion     (Blank rows = not tested)   LOWER EXTREMITY MMT: Right hip flexion and  knee extension  is 3 to 3+/5.   GAIT: Step-to gait pattern with a FWW.  TRANSFERS:  Sit to supine to sit with supervision.  TODAY'S TREATMENT:                                                                                                                              DATE: 12/25/22 EXERCISE LOG  Exercise Repetitions and Resistance Comments  Nustep L4, seat 10 x18 min   Lunges 6" step x15 reps   Hip flexion standing X15 reps Practice proper hip flexion  SAQ X15 reps AROM   Heel slides X15 reps    Blank cell = exercise not performed today   Modalities  Date: 12/25/22 Vaso: Knee, Low, 15 mins, Pain and Edema  PATIENT EDUCATION:  Education details: Discussed compliance to HEP that she was given to in  the hospital. Person educated: Patient and Child(ren) Education method: Explanation Education comprehension: verbalized understanding  HOME EXERCISE PROGRAM:  ASSESSMENT:  CLINICAL IMPRESSION: Patient presented in clinic with reports of greater R knee soreness and R heel soreness since last treatment. Patient states that she rests with a blanket under R heel for comfort. Patient able to tolerate therex fairly well with limited reps due to soreness indicated. Patient's R knee remains mod edema and aquacell donned, R TED hose donned. Mild improvement with knee flexion in heel slides and quad activation with SAQ. Normal vasopneumatic response noted following removal of the modality.  OBJECTIVE IMPAIRMENTS: Abnormal gait, decreased activity tolerance, decreased ROM, decreased strength, increased edema, and pain.   ACTIVITY LIMITATIONS: carrying, lifting, bending, standing, stairs, bathing, and locomotion level  PARTICIPATION LIMITATIONS: meal prep, cleaning, and laundry  PERSONAL FACTORS: Time since onset of injury/illness/exacerbation are also affecting patient's functional outcome.   REHAB POTENTIAL: Excellent  CLINICAL DECISION MAKING: Stable/uncomplicated  EVALUATION COMPLEXITY: Low  GOALS:  SHORT TERM GOALS: Target date: 01/01/23.  Ind with an initial HEP. Goal status: INITIAL  2.  Full right knee extension.  Goal status: INITIAL  3.  Active right knee flexion to 90 degrees+.  Goal status: INITIAL  LONG TERM GOALS: Target date: 01/15/23  Ind with an advanced HEP.  Goal status: INITIAL  2.  Active right knee flexion to 115 degrees+ so the patient can perform functional tasks and do so with pain not > 2-3/10.  Goal status: INITIAL  3.  Increase right hip and knee strength to a solid 4+/5 to provide good stability for accomplishment of functional activities.  Goal status: INITIAL  4.  Perform a reciprocating stair gait with one railing with pain not > 2-3/10.  Goal  status: INITIAL  PLAN:  PT FREQUENCY:  2-3 times a week  PT DURATION: 4 weeks  PLANNED INTERVENTIONS: Therapeutic exercises, Therapeutic activity, Neuromuscular re-education, Gait training, Patient/Family education, Self Care, Stair training, Electrical stimulation, Cryotherapy, Moist heat, Vasopneumatic device, and Manual therapy  PLAN FOR NEXT SESSION: Nustep, PROM, TKA protocol progression.  LE elevation and vasopneumatic.  Marvell Fuller, PTA 12/25/2022, 12:53 PM

## 2022-12-29 ENCOUNTER — Encounter: Payer: Self-pay | Admitting: Physical Therapy

## 2022-12-29 ENCOUNTER — Ambulatory Visit: Payer: Medicare Other | Admitting: Physical Therapy

## 2022-12-29 ENCOUNTER — Other Ambulatory Visit (HOSPITAL_COMMUNITY): Payer: Medicare Other

## 2022-12-29 DIAGNOSIS — R600 Localized edema: Secondary | ICD-10-CM

## 2022-12-29 DIAGNOSIS — M25661 Stiffness of right knee, not elsewhere classified: Secondary | ICD-10-CM

## 2022-12-29 DIAGNOSIS — M25561 Pain in right knee: Secondary | ICD-10-CM | POA: Diagnosis not present

## 2022-12-29 DIAGNOSIS — M6281 Muscle weakness (generalized): Secondary | ICD-10-CM | POA: Diagnosis not present

## 2022-12-29 DIAGNOSIS — G8929 Other chronic pain: Secondary | ICD-10-CM | POA: Diagnosis not present

## 2022-12-29 NOTE — Therapy (Signed)
OUTPATIENT PHYSICAL THERAPY LOWER EXTREMITY TREATMENT   Patient Name: Kimberly Walsh MRN: [id number] DOB:Dec 02, 1945, 77 y.o., female Today's Date: 12/29/2022  END OF SESSION:  PT End of Session - 12/29/22 1007     Visit Number 4    Number of Visits 12    Date for PT Re-Evaluation 01/15/23    Authorization Type FOTO.    PT Start Time 1016    PT Stop Time 1100    PT Time Calculation (min) 44 min    Activity Tolerance Patient tolerated treatment well    Behavior During Therapy WFL for tasks assessed/performed            Past Medical History:  Diagnosis Date   Cancer (HCC) 2016   skin cancer   Hyperlipidemia    Hypertension    Hypothyroidism    PONV (postoperative nausea and vomiting)    Venous stasis    Vertigo    Past Surgical History:  Procedure Laterality Date   basal carcinoma rt leg     CHOLECYSTECTOMY     FINGER SURGERY Right    Ring finger   MIDDLE EAR SURGERY     Fungus   TOTAL KNEE ARTHROPLASTY Right 12/14/2022   Procedure: TOTAL KNEE ARTHROPLASTY;  Surgeon: Ollen Gross, MD;  Location: [facility];  Service: Orthopedics;  Laterality: Right;   TUBAL LIGATION     VEIN SURGERY     Patient Active Problem List   Diagnosis Date Noted   Primary osteoarthritis of left knee 12/14/2022   Primary osteoarthritis of right knee 12/14/2022   Completed stroke (HCC) 09/22/2019   PVC (premature ventricular contraction) 09/22/2019   Late effect of cerebrovascular accident (CVA) 04/20/2019   CKD (chronic kidney disease) stage 3, GFR 30-59 ml/min (HCC) 03/07/2018   Peripheral edema 09/15/2017   BMI 37.0-37.9, adult 09/28/2016   Mixed hyperlipidemia 05/05/2016   Essential hypertension, benign 03/01/2014   Hypothyroidism 03/01/2014   OA (osteoarthritis) of knee 04/25/2010   REFERRING PROVIDER: Ollen Gross MD  REFERRING DIAG: Right total knee arthroplasty  THERAPY DIAG:  Chronic pain of right knee  Stiffness of right knee, not elsewhere classified  Muscle  weakness (generalized)  Localized edema  Rationale for Evaluation and Treatment: Rehabilitation  ONSET DATE: 12/14/22 (surgery date).  SUBJECTIVE:   SUBJECTIVE STATEMENT: Hasn't had much appetite since TKR. States that she does intermittently have spasm feeling in R lateral knee/quad.  PERTINENT HISTORY: HTN, hypothyroidism.    PAIN:  Are you having pain? Yes: NPRS scale: 4-5/10 Pain location: Right knee. Pain description: Sore. Aggravating factors: As above. Relieving factors: as above.  PRECAUTIONS: Other: No ultrasound.  LIVING ENVIRONMENT: Stairs: One step. Has following equipment at home: Dan Humphreys - 2 wheeled  PATIENT GOALS: Not have knee pain.  NEXT MD VISIT:  12/29/2022  OBJECTIVE:   EDEMA:  Circumferential: RT knee 7 cms > LT.  PALPATION: Tone palpable of R HS, calf  LOWER EXTREMITY ROM:    Active  Right AROM  Right AROM 12/22/22  Hip flexion    Hip extension    Hip abduction    Hip adduction    Hip internal rotation    Hip external rotation    Knee flexion 70 72  Knee extension -18 -18  Ankle dorsiflexion    Ankle plantarflexion    Ankle inversion    Ankle eversion     (Blank rows = not tested)   LOWER EXTREMITY MMT: Right hip flexion and  knee extension is 3 to 3+/5.  GAIT: Step-to gait pattern with a FWW.  TRANSFERS:  Sit to supine to sit with supervision.  TODAY'S TREATMENT:                                                                                                                              DATE: 12/29/22 EXERCISE LOG  Exercise Repetitions and Resistance Comments  Nustep L4, seat 9-8 x15 min   Slant board X2 min   Lunges 6" step x20 reps   Hip flexion standing X15 reps to 6" step Practice proper hip flexion  Seated LE short sitting X2 min   LAQ X15 reps AROM    Blank cell = exercise not performed today   Modalities  Date: 12/29/22 Vaso: Knee, Low, 15 mins, Pain and Edema  PATIENT EDUCATION:  Education details: Discussed  compliance to HEP that she was given to in the hospital. Person educated: Patient and Child(ren) Education method: Explanation Education comprehension: verbalized understanding  HOME EXERCISE PROGRAM:  ASSESSMENT:  CLINICAL IMPRESSION: Patient presented in clinic with moderate R knee pain. Patient states that she does intermittently have pain along lateral R knee which lasts a few seconds. Patient able to progress with standing exercises for ROM and stretching. Hip flexion to step was completed in order to avoid hip circumduction in standing. Patient does demonstrate min quad deficiency with LAQ. Patient continues to use FWW for gait and L knee brace due to chronic L knee pain. Normal vasopneumatic response noted following removal of the modality.  OBJECTIVE IMPAIRMENTS: Abnormal gait, decreased activity tolerance, decreased ROM, decreased strength, increased edema, and pain.   ACTIVITY LIMITATIONS: carrying, lifting, bending, standing, stairs, bathing, and locomotion level  PARTICIPATION LIMITATIONS: meal prep, cleaning, and laundry  PERSONAL FACTORS: Time since onset of injury/illness/exacerbation are also affecting patient's functional outcome.   REHAB POTENTIAL: Excellent  CLINICAL DECISION MAKING: Stable/uncomplicated  EVALUATION COMPLEXITY: Low  GOALS:  SHORT TERM GOALS: Target date: 01/01/23.  Ind with an initial HEP. Goal status: On-going  2.  Full right knee extension.  Goal status: On-going  3.  Active right knee flexion to 90 degrees+.  Goal status: On-going  LONG TERM GOALS: Target date: 01/15/23  Ind with an advanced HEP.  Goal status: On-going  2.  Active right knee flexion to 115 degrees+ so the patient can perform functional tasks and do so with pain not > 2-3/10.  Goal status: On-going  3.  Increase right hip and knee strength to a solid 4+/5 to provide good stability for accomplishment of functional activities.  Goal status: On-going  4.  Perform a  reciprocating stair gait with one railing with pain not > 2-3/10.  Goal status: On-going  PLAN:  PT FREQUENCY:  2-3 times a week  PT DURATION: 4 weeks  PLANNED INTERVENTIONS: Therapeutic exercises, Therapeutic activity, Neuromuscular re-education, Gait training, Patient/Family education, Self Care, Stair training, Electrical stimulation, Cryotherapy, Moist heat, Vasopneumatic device, and Manual therapy  PLAN FOR NEXT SESSION: Nustep,  PROM, TKA protocol progression.  LE elevation and vasopneumatic.  Marvell Fuller, PTA 12/29/2022, 11:02 AM

## 2022-12-31 ENCOUNTER — Ambulatory Visit: Payer: Medicare Other | Admitting: Physical Therapy

## 2022-12-31 DIAGNOSIS — G8929 Other chronic pain: Secondary | ICD-10-CM

## 2022-12-31 DIAGNOSIS — M25561 Pain in right knee: Secondary | ICD-10-CM | POA: Diagnosis not present

## 2022-12-31 DIAGNOSIS — M25661 Stiffness of right knee, not elsewhere classified: Secondary | ICD-10-CM

## 2022-12-31 DIAGNOSIS — R600 Localized edema: Secondary | ICD-10-CM | POA: Diagnosis not present

## 2022-12-31 DIAGNOSIS — M6281 Muscle weakness (generalized): Secondary | ICD-10-CM | POA: Diagnosis not present

## 2022-12-31 NOTE — Therapy (Addendum)
OUTPATIENT PHYSICAL THERAPY LOWER EXTREMITY TREATMENT   Patient Name: Kimberly Walsh MRN: [id number] DOB:1946-01-26, 77 y.o., female Today's Date: 12/31/2022  END OF SESSION:  PT End of Session - 12/31/22 1720     Visit Number 5    Number of Visits 12    Date for PT Re-Evaluation 01/15/23    Authorization Type FOTO.    PT Start Time 0400    PT Stop Time 0451    PT Time Calculation (min) 51 min    Activity Tolerance Patient tolerated treatment well    Behavior During Therapy WFL for tasks assessed/performed            Past Medical History:  Diagnosis Date   Cancer (HCC) 2016   skin cancer   Hyperlipidemia    Hypertension    Hypothyroidism    PONV (postoperative nausea and vomiting)    Venous stasis    Vertigo    Past Surgical History:  Procedure Laterality Date   basal carcinoma rt leg     CHOLECYSTECTOMY     FINGER SURGERY Right    Ring finger   MIDDLE EAR SURGERY     Fungus   TOTAL KNEE ARTHROPLASTY Right 12/14/2022   Procedure: TOTAL KNEE ARTHROPLASTY;  Surgeon: Ollen Gross, MD;  Location: [facility];  Service: Orthopedics;  Laterality: Right;   TUBAL LIGATION     VEIN SURGERY     Patient Active Problem List   Diagnosis Date Noted   Primary osteoarthritis of left knee 12/14/2022   Primary osteoarthritis of right knee 12/14/2022   Completed stroke (HCC) 09/22/2019   PVC (premature ventricular contraction) 09/22/2019   Late effect of cerebrovascular accident (CVA) 04/20/2019   CKD (chronic kidney disease) stage 3, GFR 30-59 ml/min (HCC) 03/07/2018   Peripheral edema 09/15/2017   BMI 37.0-37.9, adult 09/28/2016   Mixed hyperlipidemia 05/05/2016   Essential hypertension, benign 03/01/2014   Hypothyroidism 03/01/2014   OA (osteoarthritis) of knee 04/25/2010   REFERRING PROVIDER: Ollen Gross MD  REFERRING DIAG: Right total knee arthroplasty  THERAPY DIAG:  Chronic pain of right knee  Stiffness of right knee, not elsewhere classified  Muscle  weakness (generalized)  Localized edema  Rationale for Evaluation and Treatment: Rehabilitation  ONSET DATE: 12/14/22 (surgery date).  SUBJECTIVE:   SUBJECTIVE STATEMENT: Aquacel removed at MD visit.  They said I was doing good.  PERTINENT HISTORY: HTN, hypothyroidism.    PAIN:  Are you having pain? Yes: NPRS scale: 4-5/10 Pain location: Right knee. Pain description: Sore. Aggravating factors: As above. Relieving factors: as above.  PRECAUTIONS: Other: No ultrasound.  LIVING ENVIRONMENT: Stairs: One step. Has following equipment at home: Dan Humphreys - 2 wheeled  PATIENT GOALS: Not have knee pain.  NEXT MD VISIT:  12/29/2022  OBJECTIVE:   EDEMA:  Circumferential: RT knee 7 cms > LT.  PALPATION: Tone palpable of R HS, calf  LOWER EXTREMITY ROM:    Active  Right AROM  Right AROM 12/22/22  Hip flexion    Hip extension    Hip abduction    Hip adduction    Hip internal rotation    Hip external rotation    Knee flexion 70 72  Knee extension -18 -18  Ankle dorsiflexion    Ankle plantarflexion    Ankle inversion    Ankle eversion     (Blank rows = not tested)   LOWER EXTREMITY MMT: Right hip flexion and  knee extension is 3 to 3+/5.   GAIT: Step-to gait pattern with  a FWW.  TRANSFERS:  Sit to supine to sit with supervision.  TODAY'S TREATMENT:                                                                                                                              DATE: 12/31/22 EXERCISE LOG  Exercise Repetitions and Resistance Comments  Nustep Level 4 x 20 minutes moving seat forward x 3 to increase knee flexion.                       Seated and supine:  PROM x 6 minutes into right knee flexion and extension f/b  LE elevation and vasopneumatic x 15 minutes to patient's right knee.    PATIENT EDUCATION:  Education details: Discussed compliance to HEP that she was given to in the hospital. Person educated: Patient and Child(ren) Education method:  Explanation Education comprehension: verbalized understanding  HOME EXERCISE PROGRAM:  ASSESSMENT:  CLINICAL IMPRESSION: Patient without Aquacel as it was removed at MD visit.  Dr. Herbie Saxon with her progress.  She did well with Nustep today and gentle low load long duration stretching technique.   OBJECTIVE IMPAIRMENTS: Abnormal gait, decreased activity tolerance, decreased ROM, decreased strength, increased edema, and pain.   ACTIVITY LIMITATIONS: carrying, lifting, bending, standing, stairs, bathing, and locomotion level  PARTICIPATION LIMITATIONS: meal prep, cleaning, and laundry  PERSONAL FACTORS: Time since onset of injury/illness/exacerbation are also affecting patient's functional outcome.   REHAB POTENTIAL: Excellent  CLINICAL DECISION MAKING: Stable/uncomplicated  EVALUATION COMPLEXITY: Low  GOALS:  SHORT TERM GOALS: Target date: 01/01/23.  Ind with an initial HEP. Goal status: On-going  2.  Full right knee extension.  Goal status: On-going  3.  Active right knee flexion to 90 degrees+.  Goal status: On-going  LONG TERM GOALS: Target date: 01/15/23  Ind with an advanced HEP.  Goal status: On-going  2.  Active right knee flexion to 115 degrees+ so the patient can perform functional tasks and do so with pain not > 2-3/10.  Goal status: On-going  3.  Increase right hip and knee strength to a solid 4+/5 to provide good stability for accomplishment of functional activities.  Goal status: On-going  4.  Perform a reciprocating stair gait with one railing with pain not > 2-3/10.  Goal status: On-going  PLAN:  PT FREQUENCY:  2-3 times a week  PT DURATION: 4 weeks  PLANNED INTERVENTIONS: Therapeutic exercises, Therapeutic activity, Neuromuscular re-education, Gait training, Patient/Family education, Self Care, Stair training, Electrical stimulation, Cryotherapy, Moist heat, Vasopneumatic device, and Manual therapy  PLAN FOR NEXT SESSION: Nustep, PROM, TKA  protocol progression.  LE elevation and vasopneumatic.  Tremaine Fuhriman, [place], PT 12/31/2022, 5:30 PM

## 2023-01-05 ENCOUNTER — Ambulatory Visit: Payer: Medicare Other | Admitting: Physical Therapy

## 2023-01-05 DIAGNOSIS — R600 Localized edema: Secondary | ICD-10-CM | POA: Diagnosis not present

## 2023-01-05 DIAGNOSIS — G8929 Other chronic pain: Secondary | ICD-10-CM

## 2023-01-05 DIAGNOSIS — M6281 Muscle weakness (generalized): Secondary | ICD-10-CM | POA: Diagnosis not present

## 2023-01-05 DIAGNOSIS — M25561 Pain in right knee: Secondary | ICD-10-CM | POA: Diagnosis not present

## 2023-01-05 DIAGNOSIS — M25661 Stiffness of right knee, not elsewhere classified: Secondary | ICD-10-CM | POA: Diagnosis not present

## 2023-01-05 NOTE — Therapy (Signed)
OUTPATIENT PHYSICAL THERAPY LOWER EXTREMITY TREATMENT   Patient Name: Kimberly Walsh MRN: [id number] DOB:[birth year], 77 y.o., female Today's Date: 01/05/2023  END OF SESSION:  PT End of Session - 01/05/23 1457     Visit Number 6    Number of Visits 12    Date for PT Re-Evaluation 01/15/23    Authorization Type FOTO.    PT Start Time 0230    PT Stop Time 0320    PT Time Calculation (min) 50 min    Activity Tolerance Patient tolerated treatment well    Behavior During Therapy WFL for tasks assessed/performed            Past Medical History:  Diagnosis Date   Cancer (HCC) 2016   skin cancer   Hyperlipidemia    Hypertension    Hypothyroidism    PONV (postoperative nausea and vomiting)    Venous stasis    Vertigo    Past Surgical History:  Procedure Laterality Date   basal carcinoma rt leg     CHOLECYSTECTOMY     FINGER SURGERY Right    Ring finger   MIDDLE EAR SURGERY     Fungus   TOTAL KNEE ARTHROPLASTY Right 12/14/2022   Procedure: TOTAL KNEE ARTHROPLASTY;  Surgeon: Ollen Gross, MD;  Location: [facility];  Service: Orthopedics;  Laterality: Right;   TUBAL LIGATION     VEIN SURGERY     Patient Active Problem List   Diagnosis Date Noted   Primary osteoarthritis of left knee 12/14/2022   Primary osteoarthritis of right knee 12/14/2022   Completed stroke (HCC) 09/22/2019   PVC (premature ventricular contraction) 09/22/2019   Late effect of cerebrovascular accident (CVA) 04/20/2019   CKD (chronic kidney disease) stage 3, GFR 30-59 ml/min (HCC) 03/07/2018   Peripheral edema 09/15/2017   BMI 37.0-37.9, adult 09/28/2016   Mixed hyperlipidemia 05/05/2016   Essential hypertension, benign 03/01/2014   Hypothyroidism 03/01/2014   OA (osteoarthritis) of knee 04/25/2010   REFERRING PROVIDER: Ollen Gross MD  REFERRING DIAG: Right total knee arthroplasty  THERAPY DIAG:  Chronic pain of right knee  Stiffness of right knee, not elsewhere classified  Muscle  weakness (generalized)  Localized edema  Rationale for Evaluation and Treatment: Rehabilitation  ONSET DATE: 12/14/22 (surgery date).  SUBJECTIVE:   SUBJECTIVE STATEMENT: Achy/sore outside of knee.  PERTINENT HISTORY: HTN, hypothyroidism.    PAIN:  Are you having pain? Yes: NPRS scale: 3/10 Pain location: Right knee. Pain description: Sore. Aggravating factors: As above. Relieving factors: as above.  PRECAUTIONS: Other: No ultrasound.  LIVING ENVIRONMENT: Stairs: One step. Has following equipment at home: Dan Humphreys - 2 wheeled  PATIENT GOALS: Not have knee pain.  NEXT MD VISIT:  12/29/2022  OBJECTIVE:   EDEMA:  Circumferential: RT knee 7 cms > LT.  PALPATION: Tone palpable of R HS, calf  LOWER EXTREMITY ROM:    Active  Right AROM  Right AROM 12/22/22  Hip flexion    Hip extension    Hip abduction    Hip adduction    Hip internal rotation    Hip external rotation    Knee flexion 70 72  Knee extension -18 -18  Ankle dorsiflexion    Ankle plantarflexion    Ankle inversion    Ankle eversion     (Blank rows = not tested)   LOWER EXTREMITY MMT: Right hip flexion and  knee extension is 3 to 3+/5.   GAIT: Step-to gait pattern with a FWW.  TRANSFERS:  Sit to supine  to sit with supervision.  TODAY'S TREATMENT:                                                                                                                              DATE: 01/05/23 EXERCISE LOG  Exercise Repetitions and Resistance Comments  Nustep Level 4 x 15 minutes moving seat forward x 2 to increase knee flexion.                       Seated and supine:  PROM into extension x  5 minutes f/b SAQ's with 3# x 3 minutes f/b supine "knee glide" x 3 minutes f/b LE elevation and vasopneumatic x 15 minutes.     PATIENT EDUCATION:  Education details: Discussed compliance to HEP that she was given to in the hospital. Person educated: Patient and Child(ren) Education method:  Explanation Education comprehension: verbalized understanding  HOME EXERCISE PROGRAM:  ASSESSMENT:  CLINICAL IMPRESSION: Patient doing very well.  Some pain complaints at distal ITB/bursal region.  Excellent tolerance for therex and PROM today.  Patient pleased with her progress.   OBJECTIVE IMPAIRMENTS: Abnormal gait, decreased activity tolerance, decreased ROM, decreased strength, increased edema, and pain.   ACTIVITY LIMITATIONS: carrying, lifting, bending, standing, stairs, bathing, and locomotion level  PARTICIPATION LIMITATIONS: meal prep, cleaning, and laundry  PERSONAL FACTORS: Time since onset of injury/illness/exacerbation are also affecting patient's functional outcome.   REHAB POTENTIAL: Excellent  CLINICAL DECISION MAKING: Stable/uncomplicated  EVALUATION COMPLEXITY: Low  GOALS:  SHORT TERM GOALS: Target date: 01/01/23.  Ind with an initial HEP. Goal status: On-going  2.  Full right knee extension.  Goal status: On-going  3.  Active right knee flexion to 90 degrees+.  Goal status: On-going  LONG TERM GOALS: Target date: 01/15/23  Ind with an advanced HEP.  Goal status: On-going  2.  Active right knee flexion to 115 degrees+ so the patient can perform functional tasks and do so with pain not > 2-3/10.  Goal status: On-going  3.  Increase right hip and knee strength to a solid 4+/5 to provide good stability for accomplishment of functional activities.  Goal status: On-going  4.  Perform a reciprocating stair gait with one railing with pain not > 2-3/10.  Goal status: On-going  PLAN:  PT FREQUENCY:  2-3 times a week  PT DURATION: 4 weeks  PLANNED INTERVENTIONS: Therapeutic exercises, Therapeutic activity, Neuromuscular re-education, Gait training, Patient/Family education, Self Care, Stair training, Electrical stimulation, Cryotherapy, Moist heat, Vasopneumatic device, and Manual therapy  PLAN FOR NEXT SESSION: Nustep, PROM, TKA protocol progression.   LE elevation and vasopneumatic.  Sanela Evola, [place], PT 01/05/2023, 3:22 PM

## 2023-01-07 ENCOUNTER — Ambulatory Visit: Payer: Medicare Other | Admitting: Physical Therapy

## 2023-01-07 DIAGNOSIS — M25661 Stiffness of right knee, not elsewhere classified: Secondary | ICD-10-CM

## 2023-01-07 DIAGNOSIS — R600 Localized edema: Secondary | ICD-10-CM

## 2023-01-07 DIAGNOSIS — G8929 Other chronic pain: Secondary | ICD-10-CM

## 2023-01-07 DIAGNOSIS — M25561 Pain in right knee: Secondary | ICD-10-CM | POA: Diagnosis not present

## 2023-01-07 DIAGNOSIS — M6281 Muscle weakness (generalized): Secondary | ICD-10-CM

## 2023-01-07 NOTE — Therapy (Signed)
OUTPATIENT PHYSICAL THERAPY LOWER EXTREMITY TREATMENT   Patient Name: Kimberly Walsh MRN: [id number] DOB:03-26-1946, 77 y.o., female Today's Date: 01/07/2023  END OF SESSION:  PT End of Session - 01/07/23 1519     Visit Number 7    Number of Visits 12    Date for PT Re-Evaluation 01/15/23    Authorization Type FOTO.    PT Start Time 0230    PT Stop Time 0317    PT Time Calculation (min) 47 min    Activity Tolerance Patient tolerated treatment well    Behavior During Therapy Kimberly Walsh for tasks assessed/performed            Past Medical History:  Diagnosis Date   Cancer (HCC) 2016   skin cancer   Hyperlipidemia    Hypertension    Hypothyroidism    PONV (postoperative nausea and vomiting)    Venous stasis    Vertigo    Past Surgical History:  Procedure Laterality Date   basal carcinoma rt leg     CHOLECYSTECTOMY     FINGER SURGERY Right    Ring finger   MIDDLE EAR SURGERY     Fungus   TOTAL KNEE ARTHROPLASTY Right 12/14/2022   Procedure: TOTAL KNEE ARTHROPLASTY;  Surgeon: Ollen Gross, MD;  Location: [facility];  Service: Orthopedics;  Laterality: Right;   TUBAL LIGATION     VEIN SURGERY     Patient Active Problem List   Diagnosis Date Noted   Primary osteoarthritis of left knee 12/14/2022   Primary osteoarthritis of right knee 12/14/2022   Completed stroke (HCC) 09/22/2019   PVC (premature ventricular contraction) 09/22/2019   Late effect of cerebrovascular accident (CVA) 04/20/2019   CKD (chronic kidney disease) stage 3, GFR 30-59 ml/min (HCC) 03/07/2018   Peripheral edema 09/15/2017   BMI 37.0-37.9, adult 09/28/2016   Mixed hyperlipidemia 05/05/2016   Essential hypertension, benign 03/01/2014   Hypothyroidism 03/01/2014   OA (osteoarthritis) of knee 04/25/2010   REFERRING PROVIDER: Ollen Gross MD  REFERRING DIAG: Right total knee arthroplasty  THERAPY DIAG:  Chronic pain of right knee  Stiffness of right knee, not elsewhere classified  Muscle  weakness (generalized)  Localized edema  Rationale for Evaluation and Treatment: Rehabilitation  ONSET DATE: 12/14/22 (surgery date).  SUBJECTIVE:   SUBJECTIVE STATEMENT: Patient wanting to go a little easier today due to some anterior knee pain.  PERTINENT HISTORY: HTN, hypothyroidism.    PAIN:  Are you having pain? Yes: NPRS scale: 4/10 Pain location: Right knee. Pain description: Sore. Aggravating factors: As above. Relieving factors: as above.  PRECAUTIONS: Other: No ultrasound.  LIVING ENVIRONMENT: Stairs: One step. Has following equipment at home: Dan Humphreys - 2 wheeled  PATIENT GOALS: Not have knee pain.  NEXT MD VISIT:  12/29/2022  OBJECTIVE:   EDEMA:  Circumferential: RT knee 7 cms > LT.  PALPATION: Tone palpable of R HS, calf  LOWER EXTREMITY ROM:    Active  Right AROM  Right AROM 12/22/22 Right 01/07/23  Hip flexion     Hip extension     Hip abduction     Hip adduction     Hip internal rotation     Hip external rotation     Knee flexion 70 72 105   Knee extension -18 -18   Ankle dorsiflexion     Ankle plantarflexion     Ankle inversion     Ankle eversion      (Blank rows = not tested)   LOWER EXTREMITY MMT: Right  hip flexion and  knee extension is 3 to 3+/5.   GAIT: Step-to gait pattern with a FWW.  TRANSFERS:  Sit to supine to sit with supervision.  TODAY'S TREATMENT:                                                                                                                              DATE: 01/07/23 EXERCISE LOG  Exercise Repetitions and Resistance Comments  Nustep Level 1 x 20 minutes moving seat forward x 1.   3# SAQ's 4 minutes.                    f/b LE elevation and vasopneumatic to patient's right knee x 15 minutes.     PATIENT EDUCATION:  Education details: Discussed compliance to HEP that she was given to in the [facility]. Person educated: Patient and Child(ren) Education method: Explanation Education comprehension:  verbalized understanding  HOME EXERCISE PROGRAM:  ASSESSMENT:  CLINICAL IMPRESSION: Patient with some increased pain at the beginning of treatment but feeling better following.  She achieved right knee flexion to 105 degrees today.  OBJECTIVE IMPAIRMENTS: Abnormal gait, decreased activity tolerance, decreased ROM, decreased strength, increased edema, and pain.   ACTIVITY LIMITATIONS: carrying, lifting, bending, standing, stairs, bathing, and locomotion level  PARTICIPATION LIMITATIONS: meal prep, cleaning, and laundry  PERSONAL FACTORS: Time since onset of injury/illness/exacerbation are also affecting patient's functional outcome.   REHAB POTENTIAL: Excellent  CLINICAL DECISION MAKING: Stable/uncomplicated  EVALUATION COMPLEXITY: Low  GOALS:  SHORT TERM GOALS: Target date: 01/01/23.  Ind with an initial HEP. Goal status: On-going  2.  Full right knee extension.  Goal status: On-going  3.  Active right knee flexion to 90 degrees+.  Goal status: On-going  LONG TERM GOALS: Target date: 01/15/23  Ind with an advanced HEP.  Goal status: On-going  2.  Active right knee flexion to 115 degrees+ so the patient can perform functional tasks and do so with pain not > 2-3/10.  Goal status: On-going  3.  Increase right hip and knee strength to a solid 4+/5 to provide good stability for accomplishment of functional activities.  Goal status: On-going  4.  Perform a reciprocating stair gait with one railing with pain not > 2-3/10.  Goal status: On-going  PLAN:  PT FREQUENCY:  2-3 times a week  PT DURATION: 4 weeks  PLANNED INTERVENTIONS: Therapeutic exercises, Therapeutic activity, Neuromuscular re-education, Gait training, Patient/Family education, Self Care, Stair training, Electrical stimulation, Cryotherapy, Moist heat, Vasopneumatic device, and Manual therapy  PLAN FOR NEXT SESSION: Nustep, PROM, TKA protocol progression.  LE elevation and vasopneumatic.  Kimberly Walsh,  [place], PT 01/07/2023, 5:44 PM

## 2023-01-11 ENCOUNTER — Ambulatory Visit: Payer: Medicare Other | Admitting: Physical Therapy

## 2023-01-11 ENCOUNTER — Encounter: Payer: Self-pay | Admitting: Physical Therapy

## 2023-01-11 DIAGNOSIS — M25661 Stiffness of right knee, not elsewhere classified: Secondary | ICD-10-CM

## 2023-01-11 DIAGNOSIS — M6281 Muscle weakness (generalized): Secondary | ICD-10-CM

## 2023-01-11 DIAGNOSIS — G8929 Other chronic pain: Secondary | ICD-10-CM

## 2023-01-11 DIAGNOSIS — M25561 Pain in right knee: Secondary | ICD-10-CM | POA: Diagnosis not present

## 2023-01-11 DIAGNOSIS — R600 Localized edema: Secondary | ICD-10-CM | POA: Diagnosis not present

## 2023-01-11 NOTE — Therapy (Signed)
OUTPATIENT PHYSICAL THERAPY LOWER EXTREMITY TREATMENT   Patient Name: Kimberly Walsh MRN: [id number] DOB:17-Jan-1946, 77 y.o., female Today's Date: 01/11/2023  END OF SESSION:  PT End of Session - 01/11/23 1449     Visit Number 8    Number of Visits 12    Date for PT Re-Evaluation 01/15/23    Authorization Type FOTO.    PT Start Time 1433    PT Stop Time 1538    PT Time Calculation (min) 65 min    Activity Tolerance Patient tolerated treatment well    Behavior During Therapy WFL for tasks assessed/performed            Past Medical History:  Diagnosis Date   Cancer (HCC) 2016   skin cancer   Hyperlipidemia    Hypertension    Hypothyroidism    PONV (postoperative nausea and vomiting)    Venous stasis    Vertigo    Past Surgical History:  Procedure Laterality Date   basal carcinoma rt leg     CHOLECYSTECTOMY     FINGER SURGERY Right    Ring finger   MIDDLE EAR SURGERY     Fungus   TOTAL KNEE ARTHROPLASTY Right 12/14/2022   Procedure: TOTAL KNEE ARTHROPLASTY;  Surgeon: Ollen Gross, MD;  Location: [facility];  Service: Orthopedics;  Laterality: Right;   TUBAL LIGATION     VEIN SURGERY     Patient Active Problem List   Diagnosis Date Noted   Primary osteoarthritis of left knee 12/14/2022   Primary osteoarthritis of right knee 12/14/2022   Completed stroke (HCC) 09/22/2019   PVC (premature ventricular contraction) 09/22/2019   Late effect of cerebrovascular accident (CVA) 04/20/2019   CKD (chronic kidney disease) stage 3, GFR 30-59 ml/min (HCC) 03/07/2018   Peripheral edema 09/15/2017   BMI 37.0-37.9, adult 09/28/2016   Mixed hyperlipidemia 05/05/2016   Essential hypertension, benign 03/01/2014   Hypothyroidism 03/01/2014   OA (osteoarthritis) of knee 04/25/2010   REFERRING PROVIDER: Ollen Gross MD  REFERRING DIAG: Right total knee arthroplasty  THERAPY DIAG:  Chronic pain of right knee  Stiffness of right knee, not elsewhere classified  Muscle  weakness (generalized)  Localized edema  Rationale for Evaluation and Treatment: Rehabilitation  ONSET DATE: 12/14/22 (surgery date).  SUBJECTIVE:   SUBJECTIVE STATEMENT: Has more lateral R knee pain today and greater L knee pain as well. Was up for approximately 7 hours yesterday with church, eating, etc.  PERTINENT HISTORY: HTN, hypothyroidism.    PAIN:  Are you having pain? Yes: NPRS scale: 4/10 Pain location: Right lateral knee. Pain description: Sore. Aggravating factors: As above. Relieving factors: as above.  PRECAUTIONS: Other: No ultrasound.  LIVING ENVIRONMENT: Stairs: One step. Has following equipment at home: Dan Humphreys - 2 wheeled  PATIENT GOALS: Not have knee pain.  NEXT MD VISIT:  12/29/2022  OBJECTIVE:   EDEMA:  Circumferential: RT knee 7 cms > LT.  PALPATION: Tone palpable of R HS, calf  LOWER EXTREMITY ROM:    Active  Right AROM  Right AROM 12/22/22 Right 01/07/23 Right 01/11/23  Hip flexion      Hip extension      Hip abduction      Hip adduction      Hip internal rotation      Hip external rotation      Knee flexion 70 72 105  105  Knee extension -18 -18  -12  Ankle dorsiflexion      Ankle plantarflexion  Ankle inversion      Ankle eversion       (Blank rows = not tested)   LOWER EXTREMITY MMT: Right hip flexion and  knee extension is 3 to 3+/5.   GAIT: Step-to gait pattern with a FWW.  TRANSFERS:  Sit to supine to sit with supervision.  TODAY'S TREATMENT:                                                                                                                              DATE: 01/11/23 EXERCISE LOG  Exercise Repetitions and Resistance Comments  nustep L3, seat 10 x16 min   LAQ AROM x20 reps   Slant board stretch X2 min   Lunges X20 reps 8" step   Forward step up 6" step x20 reps   B hip abduction AROM x15 reps   Heel raises X20 reps   Heel prop for ext X3 min    Modalities  Date: 01/11/2023 Vaso: Knee, Low, 10  mins, Pain and Edema  PATIENT EDUCATION:  Education details: Discussed compliance to HEP that she was given to in the hospital. Person educated: Patient and Child(ren) Education method: Explanation Education comprehension: verbalized understanding  HOME EXERCISE PROGRAM:  ASSESSMENT:  CLINICAL IMPRESSION: Patient presented in clinic with greater lateral R knee pain. Patient also indicating greater L knee pain as well but has TKR scheduled for L knee. Patient able to progress with functional strengthening and ROM focused exercises but rest breaks provided due to L knee pain. Patient's R knee extension noted as improving. Normal vasopnuematic response noted following removal of the modalities. Patient not willing to release from [facility] for gait due to the buckling of the R knee.  OBJECTIVE IMPAIRMENTS: Abnormal gait, decreased activity tolerance, decreased ROM, decreased strength, increased edema, and pain.   ACTIVITY LIMITATIONS: carrying, lifting, bending, standing, stairs, bathing, and locomotion level  PARTICIPATION LIMITATIONS: meal prep, cleaning, and laundry  PERSONAL FACTORS: Time since onset of injury/illness/exacerbation are also affecting patient's functional outcome.   REHAB POTENTIAL: Excellent  CLINICAL DECISION MAKING: Stable/uncomplicated  EVALUATION COMPLEXITY: Low  GOALS:  SHORT TERM GOALS: Target date: 01/01/23.  Ind with an initial HEP. Goal status: On-going  2.  Full right knee extension.  Goal status: On-going  3.  Active right knee flexion to 90 degrees+.  Goal status: On-going  LONG TERM GOALS: Target date: 01/15/23  Ind with an advanced HEP.  Goal status: On-going  2.  Active right knee flexion to 115 degrees+ so the patient can perform functional tasks and do so with pain not > 2-3/10.  Goal status: On-going  3.  Increase right hip and knee strength to a solid 4+/5 to provide good stability for accomplishment of functional activities.  Goal  status: On-going  4.  Perform a reciprocating stair gait with one railing with pain not > 2-3/10.  Goal status: On-going  PLAN:  PT FREQUENCY:  2-3 times a week  PT DURATION: 4  weeks  PLANNED INTERVENTIONS: Therapeutic exercises, Therapeutic activity, Neuromuscular re-education, Gait training, Patient/Family education, Self Care, Stair training, Electrical stimulation, Cryotherapy, Moist heat, Vasopneumatic device, and Manual therapy  PLAN FOR NEXT SESSION: Nustep, PROM, TKA protocol progression.  LE elevation and vasopneumatic.  Marvell Fuller, PTA 01/11/2023, 4:00 PM

## 2023-01-14 ENCOUNTER — Ambulatory Visit (INDEPENDENT_AMBULATORY_CARE_PROVIDER_SITE_OTHER): Payer: Medicare Other

## 2023-01-14 ENCOUNTER — Ambulatory Visit: Payer: Medicare Other | Admitting: Physical Therapy

## 2023-01-14 ENCOUNTER — Encounter: Payer: Self-pay | Admitting: Physical Therapy

## 2023-01-14 DIAGNOSIS — M25661 Stiffness of right knee, not elsewhere classified: Secondary | ICD-10-CM | POA: Diagnosis not present

## 2023-01-14 DIAGNOSIS — E538 Deficiency of other specified B group vitamins: Secondary | ICD-10-CM

## 2023-01-14 DIAGNOSIS — R600 Localized edema: Secondary | ICD-10-CM | POA: Diagnosis not present

## 2023-01-14 DIAGNOSIS — M6281 Muscle weakness (generalized): Secondary | ICD-10-CM | POA: Diagnosis not present

## 2023-01-14 DIAGNOSIS — G8929 Other chronic pain: Secondary | ICD-10-CM

## 2023-01-14 DIAGNOSIS — M25561 Pain in right knee: Secondary | ICD-10-CM | POA: Diagnosis not present

## 2023-01-14 NOTE — Progress Notes (Signed)
Cyanocobalamin injection given to left deltoid.  Patient tolerated well. 

## 2023-01-14 NOTE — Therapy (Signed)
OUTPATIENT PHYSICAL THERAPY LOWER EXTREMITY TREATMENT   Patient Name: Kimberly Walsh MRN: [id number] DOB:February 16, 1946, 77 y.o., female Today's Date: 01/14/2023  END OF SESSION:  PT End of Session - 01/14/23 1435     Visit Number 9    Number of Visits 12    Date for PT Re-Evaluation 01/15/23    Authorization Type FOTO.    PT Start Time 1431    PT Stop Time 1515    PT Time Calculation (min) 44 min    Activity Tolerance Patient tolerated treatment well    Behavior During Therapy WFL for tasks assessed/performed            Past Medical History:  Diagnosis Date   Cancer (HCC) 2016   skin cancer   Hyperlipidemia    Hypertension    Hypothyroidism    PONV (postoperative nausea and vomiting)    Venous stasis    Vertigo    Past Surgical History:  Procedure Laterality Date   basal carcinoma rt leg     CHOLECYSTECTOMY     FINGER SURGERY Right    Ring finger   MIDDLE EAR SURGERY     Fungus   TOTAL KNEE ARTHROPLASTY Right 12/14/2022   Procedure: TOTAL KNEE ARTHROPLASTY;  Surgeon: Ollen Gross, MD;  Location: [facility];  Service: Orthopedics;  Laterality: Right;   TUBAL LIGATION     VEIN SURGERY     Patient Active Problem List   Diagnosis Date Noted   Primary osteoarthritis of left knee 12/14/2022   Primary osteoarthritis of right knee 12/14/2022   Completed stroke (HCC) 09/22/2019   PVC (premature ventricular contraction) 09/22/2019   Late effect of cerebrovascular accident (CVA) 04/20/2019   CKD (chronic kidney disease) stage 3, GFR 30-59 ml/min (HCC) 03/07/2018   Peripheral edema 09/15/2017   BMI 37.0-37.9, adult 09/28/2016   Mixed hyperlipidemia 05/05/2016   Essential hypertension, benign 03/01/2014   Hypothyroidism 03/01/2014   OA (osteoarthritis) of knee 04/25/2010   REFERRING PROVIDER: Ollen Gross MD  REFERRING DIAG: Right total knee arthroplasty  THERAPY DIAG:  Chronic pain of right knee  Stiffness of right knee, not elsewhere classified  Muscle  weakness (generalized)  Localized edema  Rationale for Evaluation and Treatment: Rehabilitation  ONSET DATE: 12/14/22 (surgery date).  SUBJECTIVE:   SUBJECTIVE STATEMENT: No issues other than swelling of R ankle. Had skipped fluid meds but has started taking them again due to swelling of the R ankle.  PERTINENT HISTORY: HTN, hypothyroidism.    PAIN:  Are you having pain? Yes: NPRS scale: 4/10 Pain location: Right lateral knee. Pain description: Sore. Aggravating factors: As above. Relieving factors: as above.  PRECAUTIONS: Other: No ultrasound.  LIVING ENVIRONMENT: Stairs: One step. Has following equipment at home: Dan Humphreys - 2 wheeled  PATIENT GOALS: Not have knee pain.  NEXT MD VISIT:  01/19/2023  OBJECTIVE:   EDEMA:  Circumferential: RT knee 7 cms > LT.  PALPATION: Tone palpable of R HS, calf  LOWER EXTREMITY ROM:    Active  Right AROM  Right AROM 12/22/22 Right 01/07/23 Right 01/11/23  Hip flexion      Hip extension      Hip abduction      Hip adduction      Hip internal rotation      Hip external rotation      Knee flexion 70 72 105  105  Knee extension -18 -18  -12  Ankle dorsiflexion      Ankle plantarflexion  Ankle inversion      Ankle eversion       (Blank rows = not tested)   LOWER EXTREMITY MMT: Right hip flexion and  knee extension is 3 to 3+/5.   GAIT: Step-to gait pattern with a FWW.  TRANSFERS:  Sit to supine to sit with supervision.  TODAY'S TREATMENT:                                                                                                                              DATE: 01/14/23 EXERCISE LOG  Exercise Repetitions and Resistance Comments  nustep L3, seat 8-7 x16 min   LAQ 3# x20 reps   Lunges X20 reps 12" step   Forward step up 6" step x15 reps   B hip abduction AROM x10 reps   Heel raises X20 reps   Heel prop for ext X3 min 3# overpressure     Modalities  Date: 01/14/2023 Vaso: Knee, Low, 10 mins, Pain and  Edema  PATIENT EDUCATION:  Education details: Discussed compliance to HEP that she was given to in the hospital. Person educated: Patient and Child(ren) Education method: Explanation Education comprehension: verbalized understanding  HOME EXERCISE PROGRAM:  ASSESSMENT:  CLINICAL IMPRESSION: Patient presented in clinic with reports of no issues and did not use AD today. Patient able to tolerate therex but also reporting more lateral R knee pain. Patient has started using fluid medication again. Limited therex due to lateral R knee pain. Normal vasopneumatic response noted following removal of the modality.  OBJECTIVE IMPAIRMENTS: Abnormal gait, decreased activity tolerance, decreased ROM, decreased strength, increased edema, and pain.   ACTIVITY LIMITATIONS: carrying, lifting, bending, standing, stairs, bathing, and locomotion level  PARTICIPATION LIMITATIONS: meal prep, cleaning, and laundry  PERSONAL FACTORS: Time since onset of injury/illness/exacerbation are also affecting patient's functional outcome.   REHAB POTENTIAL: Excellent  CLINICAL DECISION MAKING: Stable/uncomplicated  EVALUATION COMPLEXITY: Low  GOALS:  SHORT TERM GOALS: Target date: 01/01/23.  Ind with an initial HEP. Goal status: On-going  2.  Full right knee extension.  Goal status: On-going  3.  Active right knee flexion to 90 degrees+.  Goal status: On-going  LONG TERM GOALS: Target date: 01/15/23  Ind with an advanced HEP.  Goal status: On-going  2.  Active right knee flexion to 115 degrees+ so the patient can perform functional tasks and do so with pain not > 2-3/10.  Goal status: On-going  3.  Increase right hip and knee strength to a solid 4+/5 to provide good stability for accomplishment of functional activities.  Goal status: On-going  4.  Perform a reciprocating stair gait with one railing with pain not > 2-3/10.  Goal status: On-going  PLAN:  PT FREQUENCY:  2-3 times a week  PT  DURATION: 4 weeks  PLANNED INTERVENTIONS: Therapeutic exercises, Therapeutic activity, Neuromuscular re-education, Gait training, Patient/Family education, Self Care, Stair training, Electrical stimulation, Cryotherapy, Moist heat, Vasopneumatic device, and Manual therapy  PLAN FOR  NEXT SESSION: Nustep, PROM, TKA protocol progression.  LE elevation and vasopneumatic.  Marvell Fuller, PTA 01/14/2023, 4:14 PM

## 2023-01-19 ENCOUNTER — Encounter: Payer: Self-pay | Admitting: Physical Therapy

## 2023-01-19 ENCOUNTER — Ambulatory Visit: Payer: Medicare Other | Admitting: Physical Therapy

## 2023-01-19 DIAGNOSIS — G8929 Other chronic pain: Secondary | ICD-10-CM

## 2023-01-19 DIAGNOSIS — M25561 Pain in right knee: Secondary | ICD-10-CM | POA: Diagnosis not present

## 2023-01-19 DIAGNOSIS — M6281 Muscle weakness (generalized): Secondary | ICD-10-CM | POA: Diagnosis not present

## 2023-01-19 DIAGNOSIS — R600 Localized edema: Secondary | ICD-10-CM | POA: Diagnosis not present

## 2023-01-19 DIAGNOSIS — M25661 Stiffness of right knee, not elsewhere classified: Secondary | ICD-10-CM

## 2023-01-19 DIAGNOSIS — Z5189 Encounter for other specified aftercare: Secondary | ICD-10-CM | POA: Diagnosis not present

## 2023-01-19 NOTE — Therapy (Signed)
OUTPATIENT PHYSICAL THERAPY LOWER EXTREMITY TREATMENT   Patient Name: Kimberly Walsh MRN: [id number] DOB:Sep 02, 1945, 77 y.o., female Today's Date: 01/19/2023  END OF SESSION:  PT End of Session - 01/19/23 1643     Visit Number 10    Number of Visits 12    Date for PT Re-Evaluation 01/15/23    Authorization Type FOTO.    Activity Tolerance Patient tolerated treatment well    Behavior During Therapy [facility] for tasks assessed/performed            Past Medical History:  Diagnosis Date   Cancer (HCC) 2016   skin cancer   Hyperlipidemia    Hypertension    Hypothyroidism    PONV (postoperative nausea and vomiting)    Venous stasis    Vertigo    Past Surgical History:  Procedure Laterality Date   basal carcinoma rt leg     CHOLECYSTECTOMY     FINGER SURGERY Right    Ring finger   MIDDLE EAR SURGERY     Fungus   TOTAL KNEE ARTHROPLASTY Right 12/14/2022   Procedure: TOTAL KNEE ARTHROPLASTY;  Surgeon: Ollen Gross, MD;  Location: [facility];  Service: Orthopedics;  Laterality: Right;   TUBAL LIGATION     VEIN SURGERY     Patient Active Problem List   Diagnosis Date Noted   Primary osteoarthritis of left knee 12/14/2022   Primary osteoarthritis of right knee 12/14/2022   Completed stroke (HCC) 09/22/2019   PVC (premature ventricular contraction) 09/22/2019   Late effect of cerebrovascular accident (CVA) 04/20/2019   CKD (chronic kidney disease) stage 3, GFR 30-59 ml/min (HCC) 03/07/2018   Peripheral edema 09/15/2017   BMI 37.0-37.9, adult 09/28/2016   Mixed hyperlipidemia 05/05/2016   Essential hypertension, benign 03/01/2014   Hypothyroidism 03/01/2014   OA (osteoarthritis) of knee 04/25/2010   REFERRING PROVIDER: Ollen Gross MD  REFERRING DIAG: Right total knee arthroplasty  THERAPY DIAG:  Chronic pain of right knee  Stiffness of right knee, not elsewhere classified  Muscle weakness (generalized)  Localized edema  Rationale for Evaluation and Treatment:  Rehabilitation  ONSET DATE: 12/14/22 (surgery date).  SUBJECTIVE:   SUBJECTIVE STATEMENT: Pain comes and goes.  PERTINENT HISTORY: HTN, hypothyroidism.    PAIN:  Are you having pain? Yes: NPRS scale: Come and goes/10 Pain location: Right lateral knee. Pain description: Sore. Aggravating factors: As above. Relieving factors: as above.  PRECAUTIONS: Other: No ultrasound.  LIVING ENVIRONMENT: Stairs: One step. Has following equipment at home: Dan Humphreys - 2 wheeled  PATIENT GOALS: Not have knee pain.  NEXT MD VISIT:  01/19/2023  OBJECTIVE:   EDEMA:  Circumferential: RT knee 7 cms > LT.  PALPATION: Tone palpable of R HS, calf  LOWER EXTREMITY ROM:    Active  Right AROM  Right AROM 12/22/22 Right 01/07/23 Right 01/11/23  Hip flexion      Hip extension      Hip abduction      Hip adduction      Hip internal rotation      Hip external rotation      Knee flexion 70 72 105  105  Knee extension -18 -18  -12  Ankle dorsiflexion      Ankle plantarflexion      Ankle inversion      Ankle eversion       (Blank rows = not tested)   LOWER EXTREMITY MMT: Right hip flexion and  knee extension is 3 to 3+/5.   GAIT: Step-to gait pattern  with a FWW.  TRANSFERS:  Sit to supine to sit with supervision.  TODAY'S TREATMENT:                                                                                                                              DATE: 01/19/23 EXERCISE LOG  Exercise Repetitions and Resistance Comments  nustep L3 15 min moving seat forward x 2 to increase flexion.   Rockerboard 4 minutes   LAQ's  4 minutes.                   In supine:  Sustained one minute flexion stretch to patient's right knee f/b LE elevation and vasopneumatic x 15 minutes.    PATIENT EDUCATION:  Education details: Discussed compliance to HEP that she was given to in the hospital. Person educated: Patient and Child(ren) Education method: Explanation Education comprehension:  verbalized understanding  HOME EXERCISE PROGRAM:  ASSESSMENT:  CLINICAL IMPRESSION: Patient doing well.  Going to MD this afternoon.  She has some continued soreness over her right distal ITB region.  Her range of motion has improved significantly since starting PT.  OBJECTIVE IMPAIRMENTS: Abnormal gait, decreased activity tolerance, decreased ROM, decreased strength, increased edema, and pain.   ACTIVITY LIMITATIONS: carrying, lifting, bending, standing, stairs, bathing, and locomotion level  PARTICIPATION LIMITATIONS: meal prep, cleaning, and laundry  PERSONAL FACTORS: Time since onset of injury/illness/exacerbation are also affecting patient's functional outcome.   REHAB POTENTIAL: Excellent  CLINICAL DECISION MAKING: Stable/uncomplicated  EVALUATION COMPLEXITY: Low  GOALS:  SHORT TERM GOALS: Target date: 01/01/23.  Ind with an initial HEP. Goal status: On-going  2.  Full right knee extension.  Goal status: On-going  3.  Active right knee flexion to 90 degrees+.  Goal status: On-going  LONG TERM GOALS: Target date: 01/15/23  Ind with an advanced HEP.  Goal status: On-going  2.  Active right knee flexion to 115 degrees+ so the patient can perform functional tasks and do so with pain not > 2-3/10.  Goal status: On-going  3.  Increase right hip and knee strength to a solid 4+/5 to provide good stability for accomplishment of functional activities.  Goal status: On-going  4.  Perform a reciprocating stair gait with one railing with pain not > 2-3/10.  Goal status: On-going  PLAN:  PT FREQUENCY:  2-3 times a week  PT DURATION: 4 weeks  PLANNED INTERVENTIONS: Therapeutic exercises, Therapeutic activity, Neuromuscular re-education, Gait training, Patient/Family education, Self Care, Stair training, Electrical stimulation, Cryotherapy, Moist heat, Vasopneumatic device, and Manual therapy  PLAN FOR NEXT SESSION: Nustep, PROM, TKA protocol progression.  LE elevation  and vasopneumatic.  Cashawn Yanko, [place], PT 01/19/2023, 4:54 PM

## 2023-01-21 ENCOUNTER — Ambulatory Visit: Payer: Medicare Other | Attending: Orthopedic Surgery | Admitting: Physical Therapy

## 2023-01-21 ENCOUNTER — Encounter: Payer: Self-pay | Admitting: Physical Therapy

## 2023-01-21 DIAGNOSIS — R600 Localized edema: Secondary | ICD-10-CM | POA: Diagnosis not present

## 2023-01-21 DIAGNOSIS — M25661 Stiffness of right knee, not elsewhere classified: Secondary | ICD-10-CM | POA: Insufficient documentation

## 2023-01-21 DIAGNOSIS — G8929 Other chronic pain: Secondary | ICD-10-CM | POA: Insufficient documentation

## 2023-01-21 DIAGNOSIS — M25561 Pain in right knee: Secondary | ICD-10-CM | POA: Insufficient documentation

## 2023-01-21 DIAGNOSIS — M6281 Muscle weakness (generalized): Secondary | ICD-10-CM | POA: Insufficient documentation

## 2023-01-21 NOTE — Therapy (Addendum)
OUTPATIENT PHYSICAL THERAPY LOWER EXTREMITY TREATMENT   Patient Name: Kimberly Walsh MRN: [id number] DOB:21-Mar-1946, 77 y.o., female Today's Date: 01/21/2023  END OF SESSION:  PT End of Session - 01/21/23 1431     Visit Number 11    Number of Visits 12    Date for PT Re-Evaluation 01/15/23    Authorization Type FOTO.    PT Start Time 1431    Activity Tolerance Patient tolerated treatment well    Behavior During Therapy [facility] for tasks assessed/performed            Past Medical History:  Diagnosis Date   Cancer (HCC) 2016   skin cancer   Hyperlipidemia    Hypertension    Hypothyroidism    PONV (postoperative nausea and vomiting)    Venous stasis    Vertigo    Past Surgical History:  Procedure Laterality Date   basal carcinoma rt leg     CHOLECYSTECTOMY     FINGER SURGERY Right    Ring finger   MIDDLE EAR SURGERY     Fungus   TOTAL KNEE ARTHROPLASTY Right 12/14/2022   Procedure: TOTAL KNEE ARTHROPLASTY;  Surgeon: Ollen Gross, MD;  Location: [facility];  Service: Orthopedics;  Laterality: Right;   TUBAL LIGATION     VEIN SURGERY     Patient Active Problem List   Diagnosis Date Noted   Primary osteoarthritis of left knee 12/14/2022   Primary osteoarthritis of right knee 12/14/2022   Completed stroke (HCC) 09/22/2019   PVC (premature ventricular contraction) 09/22/2019   Late effect of cerebrovascular accident (CVA) 04/20/2019   CKD (chronic kidney disease) stage 3, GFR 30-59 ml/min (HCC) 03/07/2018   Peripheral edema 09/15/2017   BMI 37.0-37.9, adult 09/28/2016   Mixed hyperlipidemia 05/05/2016   Essential hypertension, benign 03/01/2014   Hypothyroidism 03/01/2014   OA (osteoarthritis) of knee 04/25/2010   REFERRING PROVIDER: Ollen Gross MD  REFERRING DIAG: Right total knee arthroplasty  THERAPY DIAG:  Chronic pain of right knee  Stiffness of right knee, not elsewhere classified  Muscle weakness (generalized)  Localized edema  Rationale for  Evaluation and Treatment: Rehabilitation  ONSET DATE: 12/14/22 (surgery date).  SUBJECTIVE:   SUBJECTIVE STATEMENT: Got a good report from Dr. Despina Hick and he measured 110 deg of knee flexion while there. Reports that she mowed her yard yesterday but doesn't have to use her legs. States that she felt good to get out of the house.  PERTINENT HISTORY: HTN, hypothyroidism.    PAIN:  Are you having pain? Yes: NPRS scale: Come and goes/10 Pain location: Right lateral knee. Pain description: Sore. Aggravating factors: As above. Relieving factors: as above.  PRECAUTIONS: Other: No ultrasound.  LIVING ENVIRONMENT: Stairs: One step. Has following equipment at home: Dan Humphreys - 2 wheeled  PATIENT GOALS: Not have knee pain.  NEXT MD VISIT:  01/19/2023  OBJECTIVE:   EDEMA:  Circumferential: RT knee 7 cms > LT.  PALPATION: Tone palpable of R HS, calf  LOWER EXTREMITY ROM:    Active  Right AROM  Right AROM 12/22/22 Right 01/07/23 Right 01/11/23 Right 01/21/2023  Hip flexion       Hip extension       Hip abduction       Hip adduction       Hip internal rotation       Hip external rotation       Knee flexion 70 72 105  105 110  Knee extension -18 -18  -12 -10  Ankle  dorsiflexion       Ankle plantarflexion       Ankle inversion       Ankle eversion        (Blank rows = not tested)   LOWER EXTREMITY MMT: Right hip flexion and  knee extension is 4+/5  GAIT: Step-to gait pattern with a FWW.  TRANSFERS:  Sit to supine to sit with supervision.  TODAY'S TREATMENT:                                                                                                                              DATE: 01/21/23 EXERCISE LOG  Exercise Repetitions and Resistance Comments  Nustep L3, seat 9-7 x18 min   Stair training Descending with RLE, ascending with LLE Non-reciprical due to R knee pain  Rockerboard X3 min   Lunges 8" step x20 reps   Hip abduction AROM 3x10 reps   LAQ 4# x30 reps   Heel  prop into ext X2 min    Modalities  Date: 01/21/2023 Vaso: Knee, Low, 10 mins, Edema  PATIENT EDUCATION:  Education details: Discussed compliance to HEP that she was given to in the hospital. Person educated: Patient and Child(ren) Education method: Explanation Education comprehension: verbalized understanding  HOME EXERCISE PROGRAM:  ASSESSMENT:  CLINICAL IMPRESSION: Patient presented in clinic with mild R knee discomfort but trying to return to as much of normal activity as possible by the L knee pain. Stairs goal is more greatly affected by the L knee as descending with LLE causes increased R knee pain or unable to weightbear to ascending reciprically. Mainly goals that were not met were ROM goals or limited by L knee pain. Normal vasopneumatic response noted following removal of the modality. Patient was educated to continue mobility and heel prop stretch while waiting for the L TKR.  OBJECTIVE IMPAIRMENTS: Abnormal gait, decreased activity tolerance, decreased ROM, decreased strength, increased edema, and pain.   ACTIVITY LIMITATIONS: carrying, lifting, bending, standing, stairs, bathing, and locomotion level  PARTICIPATION LIMITATIONS: meal prep, cleaning, and laundry  PERSONAL FACTORS: Time since onset of injury/illness/exacerbation are also affecting patient's functional outcome.   REHAB POTENTIAL: Excellent  CLINICAL DECISION MAKING: Stable/uncomplicated  EVALUATION COMPLEXITY: Low  GOALS:  SHORT TERM GOALS: Target date: 01/01/23.  Ind with an initial HEP. Goal status: MET  2.  Full right knee extension.  Goal status: NOT MET  3.  Active right knee flexion to 90 degrees+.  Goal status: MET  LONG TERM GOALS: Target date: 01/15/23  Ind with an advanced HEP.  Goal status: UNABLE TO ASSESS  2.  Active right knee flexion to 115 degrees+ so the patient can perform functional tasks and do so with pain not > 2-3/10.  Goal status: NOT MET  3.  Increase right hip and  knee strength to a solid 4+/5 to provide good stability for accomplishment of functional activities.  Goal status: MET   4.  Perform a reciprocating stair  gait with one railing with pain not > 2-3/10.  Goal status: NOT MET/ secondary to R knee pain  PLAN:  PT FREQUENCY:  2-3 times a week  PT DURATION: 4 weeks  PLANNED INTERVENTIONS: Therapeutic exercises, Therapeutic activity, Neuromuscular re-education, Gait training, Patient/Family education, Self Care, Stair training, Electrical stimulation, Cryotherapy, Moist heat, Vasopneumatic device, and Manual therapy  PLAN FOR NEXT SESSION: DC  Marvell Fuller, PTA 01/21/2023, 3:22 PM   PHYSICAL THERAPY DISCHARGE SUMMARY  Visits from Start of Care: 11.  Current functional level related to goals / functional outcomes: See above.   Remaining deficits: Please see goal section.   Education / Equipment: HEP.   Patient agrees to discharge. Patient goals were partially met. Patient is being discharged due to being pleased with the current functional level.    Italy Applegate MPT

## 2023-01-28 ENCOUNTER — Encounter: Payer: Medicare Other | Admitting: Physical Therapy

## 2023-02-11 DIAGNOSIS — Z5189 Encounter for other specified aftercare: Secondary | ICD-10-CM | POA: Diagnosis not present

## 2023-02-16 ENCOUNTER — Ambulatory Visit (INDEPENDENT_AMBULATORY_CARE_PROVIDER_SITE_OTHER): Payer: Medicare Other

## 2023-02-16 DIAGNOSIS — E538 Deficiency of other specified B group vitamins: Secondary | ICD-10-CM

## 2023-02-16 MED ORDER — CYANOCOBALAMIN 1000 MCG/ML IJ SOLN
1000.0000 ug | INTRAMUSCULAR | Status: DC
Start: 2023-02-16 — End: 2023-12-15
  Administered 2023-02-16 – 2023-11-02 (×8): 1000 ug via INTRAMUSCULAR

## 2023-02-16 NOTE — Progress Notes (Signed)
Cyanocobalamin injection given to right deltoid.  Patient tolerated well. 

## 2023-03-10 ENCOUNTER — Telehealth: Payer: Self-pay

## 2023-03-10 NOTE — Telephone Encounter (Signed)
Patient is having surgery in October. She should she stop taking her plavix and restart?

## 2023-03-11 NOTE — Telephone Encounter (Signed)
Stop 2 days prior to surgery

## 2023-03-17 ENCOUNTER — Encounter (HOSPITAL_COMMUNITY): Payer: Self-pay

## 2023-03-17 NOTE — Progress Notes (Signed)
Surgery orders requested via Epic inbox. °

## 2023-03-17 NOTE — Patient Instructions (Signed)
SURGICAL WAITING ROOM VISITATION Patients having surgery or a procedure may have no more than 2 support people in the waiting area - these visitors may rotate.    Children under the age of 25 must have an adult with them who is not the patient.  If the patient needs to stay at the hospital during part of their recovery, the visitor guidelines for inpatient rooms apply. Pre-op nurse will coordinate an appropriate time for 1 support person to accompany patient in pre-op.  This support person may not rotate.    Please refer to the [facility] website for the visitor guidelines for Inpatients (after your surgery is over and you are in a regular room).       Your procedure is scheduled on: 03-29-23   Report to [facility] Main Entrance    Report to admitting at 5:15 AM   Call this number if you have problems the morning of surgery [phone]   Do not eat food :After Midnight.   After Midnight you may have the following liquids until 4:15 AM DAY OF SURGERY  Water Non-Citrus Juices (without pulp, NO RED-Apple, White grape, White cranberry) Black Coffee (NO MILK/CREAM OR CREAMERS, sugar ok)  Clear Tea (NO MILK/CREAM OR CREAMERS, sugar ok) regular and decaf                             Plain Jell-O (NO RED)                                           Fruit ices (not with fruit pulp, NO RED)                                     Popsicles (NO RED)                                                               Sports drinks like Gatorade (NO RED)                   The day of surgery:  Drink ONE (1) Pre-Surgery Clear Ensure by 4:15 AM the morning of surgery. Drink in one sitting. Do not sip.  This drink was given to you during your hospital  pre-op appointment visit. Nothing else to drink after completing the Pre-Surgery Clear Ensure.          If you have questions, please contact your surgeon's office.   FOLLOW ANY ADDITIONAL PRE OP INSTRUCTIONS YOU RECEIVED FROM YOUR SURGEON'S  OFFICE!!!     Oral Hygiene is also important to reduce your risk of infection.                                    Remember - BRUSH YOUR TEETH THE MORNING OF SURGERY WITH YOUR REGULAR TOOTHPASTE   Do NOT smoke after Midnight   Take these medicines the morning of surgery with A SIP OF WATER:   Atorvastatin  Levothyroxine  If needed  Tylenol, Meclizine, Oxycodone or Tramadol  Stop all vitamins and herbal supplements 7 days before surgery                             You may not have any metal on your body including hair pins, jewelry, and body piercing             Do not wear make-up, lotions, powders, perfumes or deodorant  Do not wear nail polish including gel and S&S, artificial/acrylic nails, or any other type of covering on natural nails including finger and toenails. If you have artificial nails, gel coating, etc. that needs to be removed by a nail salon please have this removed prior to surgery or surgery may need to be canceled/ delayed if the surgeon/ anesthesia feels like they are unable to be safely monitored.   Do not shave  48 hours prior to surgery.    Do not bring valuables to the hospital. [facility] IS NOT RESPONSIBLE   FOR VALUABLES.   Contacts, dentures or bridgework may not be worn into surgery.   Bring small overnight bag day of surgery.   DO NOT BRING YOUR HOME MEDICATIONS TO THE HOSPITAL. PHARMACY WILL DISPENSE MEDICATIONS LISTED ON YOUR MEDICATION LIST TO YOU DURING YOUR ADMISSION IN THE HOSPITAL!   Special Instructions: Bring a copy of your healthcare power of attorney and living will documents the day of surgery if you haven't scanned them before.              Please read over the following fact sheets you were given: IF YOU HAVE QUESTIONS ABOUT YOUR PRE-OP INSTRUCTIONS PLEASE CALL [phone]  If you received a COVID test during your pre-op visit  it is requested that you wear a mask when out in public, stay away from anyone that may not be feeling well and  notify your surgeon if you develop symptoms. If you test positive for Covid or have been in contact with anyone that has tested positive in the last 10 days please notify you surgeon.    Pre-operative 5 CHG Bath Instructions   You can play a key role in reducing the risk of infection after surgery. Your skin needs to be as free of germs as possible. You can reduce the number of germs on your skin by washing with CHG (chlorhexidine gluconate) soap before surgery. CHG is an antiseptic soap that kills germs and continues to kill germs even after washing.   DO NOT use if you have an allergy to chlorhexidine/CHG or antibacterial soaps. If your skin becomes reddened or irritated, stop using the CHG and notify one of our RNs at [phone].   Please shower with the CHG soap starting 4 days before surgery using the following schedule:     Please keep in mind the following:  DO NOT shave, including legs and underarms, starting the day of your first shower.   You may shave your face at any point before/day of surgery.  Place clean sheets on your bed the day you start using CHG soap. Use a clean washcloth (not used since being washed) for each shower. DO NOT sleep with pets once you start using the CHG.   CHG Shower Instructions:  If you choose to wash your hair and private area, wash first with your normal shampoo/soap.  After you use shampoo/soap, rinse your hair and body thoroughly to remove shampoo/soap residue.  Turn the water OFF and apply  about 3 tablespoons (45 ml) of CHG soap to a CLEAN washcloth.  Apply CHG soap ONLY FROM YOUR NECK DOWN TO YOUR TOES (washing for 3-5 minutes)  DO NOT use CHG soap on face, private areas, open wounds, or sores.  Pay special attention to the area where your surgery is being performed.  If you are having back surgery, having someone wash your back for you may be helpful. Wait 2 minutes after CHG soap is applied, then you may rinse off the CHG soap.  Pat dry  with a clean towel  Put on clean clothes/pajamas   If you choose to wear lotion, please use ONLY the CHG-compatible lotions on the back of this paper.     Additional instructions for the day of surgery: DO NOT APPLY any lotions, deodorants, cologne, or perfumes.   Put on clean/comfortable clothes.  Brush your teeth.  Ask your nurse before applying any prescription medications to the skin.      CHG Compatible Lotions   Aveeno Moisturizing lotion  Cetaphil Moisturizing Cream  Cetaphil Moisturizing Lotion  Clairol Herbal Essence Moisturizing Lotion, Dry Skin  Clairol Herbal Essence Moisturizing Lotion, Extra Dry Skin  Clairol Herbal Essence Moisturizing Lotion, Normal Skin  Curel Age Defying Therapeutic Moisturizing Lotion with Alpha Hydroxy  Curel Extreme Care Body Lotion  Curel Soothing Hands Moisturizing Hand Lotion  Curel Therapeutic Moisturizing Cream, Fragrance-Free  Curel Therapeutic Moisturizing Lotion, Fragrance-Free  Curel Therapeutic Moisturizing Lotion, Original Formula  Eucerin Daily Replenishing Lotion  Eucerin Dry Skin Therapy Plus Alpha Hydroxy Crme  Eucerin Dry Skin Therapy Plus Alpha Hydroxy Lotion  Eucerin Original Crme  Eucerin Original Lotion  Eucerin Plus Crme Eucerin Plus Lotion  Eucerin TriLipid Replenishing Lotion  Keri Anti-Bacterial Hand Lotion  Keri Deep Conditioning Original Lotion Dry Skin Formula Softly Scented  Keri Deep Conditioning Original Lotion, Fragrance Free Sensitive Skin Formula  Keri Lotion Fast Absorbing Fragrance Free Sensitive Skin Formula  Keri Lotion Fast Absorbing Softly Scented Dry Skin Formula  Keri Original Lotion  Keri Skin Renewal Lotion Keri Silky Smooth Lotion  Keri Silky Smooth Sensitive Skin Lotion  Nivea Body Creamy Conditioning Oil  Nivea Body Extra Enriched Teacher, adult education Moisturizing Lotion Nivea Crme  Nivea Skin Firming Lotion  NutraDerm 30 Skin Lotion  NutraDerm Skin  Lotion  NutraDerm Therapeutic Skin Cream  NutraDerm Therapeutic Skin Lotion  ProShield Protective Hand Cream  Provon moisturizing lotion   PATIENT SIGNATURE_________________________________  NURSE SIGNATURE__________________________________  ________________________________________________________________________

## 2023-03-17 NOTE — H&P (Signed)
TOTAL KNEE ADMISSION H&P  Patient is being admitted for left total knee arthroplasty.  Subjective:  Chief Complaint: Left knee pain.  HPI: Kimberly Walsh, 77 y.o. female has a history of pain and functional disability in the left knee due to arthritis and has failed non-surgical conservative treatments for greater than 12 weeks to include corticosteriod injections, viscosupplementation injections, and activity modification. Onset of symptoms was gradual, starting 4 years ago with gradually worsening course since that time. The patient noted no past surgery on the left knee.  Patient currently rates pain in the left knee at 8 out of 10 with activity. Patient has worsening of pain with activity and weight bearing and pain that interferes with activities of daily living. Patient has evidence of periarticular osteophytes and joint space narrowing by imaging studies. There is no active infection.  Patient Active Problem List   Diagnosis Date Noted   Primary osteoarthritis of left knee 12/14/2022   Primary osteoarthritis of right knee 12/14/2022   Completed stroke (HCC) 09/22/2019   PVC (premature ventricular contraction) 09/22/2019   Late effect of cerebrovascular accident (CVA) 04/20/2019   CKD (chronic kidney disease) stage 3, GFR 30-59 ml/min (HCC) 03/07/2018   Peripheral edema 09/15/2017   BMI 37.0-37.9, adult 09/28/2016   Mixed hyperlipidemia 05/05/2016   Essential hypertension, benign 03/01/2014   Hypothyroidism 03/01/2014   OA (osteoarthritis) of knee 04/25/2010    Past Medical History:  Diagnosis Date   Cancer (HCC) 2016   skin cancer   Hyperlipidemia    Hypertension    Hypothyroidism    PONV (postoperative nausea and vomiting)    Stroke (HCC)    Venous stasis    Vertigo     Past Surgical History:  Procedure Laterality Date   basal carcinoma rt leg     CHOLECYSTECTOMY     FINGER SURGERY Right    Ring finger   MIDDLE EAR SURGERY     Fungus   TOTAL KNEE ARTHROPLASTY  Right 12/14/2022   Procedure: TOTAL KNEE ARTHROPLASTY;  Surgeon: Ollen Gross, MD;  Location: [facility];  Service: Orthopedics;  Laterality: Right;   TUBAL LIGATION     VEIN SURGERY      Prior to Admission medications   Medication Sig Start Date End Date Taking? Authorizing Provider  acetaminophen (TYLENOL) 500 MG tablet Take 1,000 mg by mouth every 8 (eight) hours as needed for moderate pain.   Yes [provider]  atorvastatin (LIPITOR) 40 MG tablet Take 1 tablet (40 mg total) by mouth daily. 11/02/22  Yes Daphine Deutscher, Mary-Margaret, FNP  clopidogrel (PLAVIX) 75 MG tablet Take 1 tablet (75 mg total) by mouth daily. 11/02/22  Yes Daphine Deutscher, Mary-Margaret, FNP  furosemide (LASIX) 40 MG tablet Take 1 tablet (40 mg total) by mouth daily. 11/02/22  Yes Daphine Deutscher, Mary-Margaret, FNP  levothyroxine (SYNTHROID) 50 MCG tablet Take 1 tablet (50 mcg total) by mouth daily. 11/02/22  Yes Martin, Mary-Margaret, FNP  losartan (COZAAR) 25 MG tablet Take 1 tablet (25 mg total) by mouth daily. 11/02/22  Yes Daphine Deutscher, Mary-Margaret, FNP  meclizine (ANTIVERT) 25 MG tablet Take 1 tablet by mouth every 6 hours as need for vertigo 06/04/22  Yes Daphine Deutscher, Mary-Margaret, FNP  methocarbamol (ROBAXIN) 500 MG tablet Take 1 tablet (500 mg total) by mouth every 6 (six) hours as needed for muscle spasms. 12/15/22  Yes Edmisten, Kristie L, PA  ondansetron (ZOFRAN) 4 MG tablet Take 1 tablet (4 mg total) by mouth every 6 (six) hours as needed for nausea. 12/15/22  Yes Edmisten, Kristie L, PA  traMADol (ULTRAM) 50 MG tablet Take 1-2 tablets (50-100 mg total) by mouth every 6 (six) hours as needed for moderate pain. 12/15/22  Yes Edmisten, Kristie L, PA  oxyCODONE (OXY IR/ROXICODONE) 5 MG immediate release tablet Take 1-2 tablets (5-10 mg total) by mouth every 8 (eight) hours as needed for severe pain. 12/15/22   Edmisten, Lyn Hollingshead, PA    Allergies  Allergen Reactions   Ace Inhibitors Cough    Social History   Socioeconomic History    Marital status: Widowed    Spouse name: Not on file   Number of children: 3   Years of education: 12   Highest education level: High school graduate  Occupational History    Employer: [employer]    Comment: part time  Tobacco Use   Smoking status: Never   Smokeless tobacco: Never  Vaping Use   Vaping status: Never Used  Substance and Sexual Activity   Alcohol use: No   Drug use: No   Sexual activity: Not Currently  Other Topics Concern   Not on file  Social History Narrative   Retired Photographer   3 children-2 daughters and 1 son   10 grandchildren   Widowed since 04/22/2019.   Social Determinants of Health   Financial Resource Strain: Low Risk  (06/24/2022)   Overall Financial Resource Strain (CARDIA)    Difficulty of Paying Living Expenses: Not hard at all  Food Insecurity: No Food Insecurity (12/14/2022)   Hunger Vital Sign    Worried About Running Out of Food in the Last Year: Never true    Ran Out of Food in the Last Year: Never true  Transportation Needs: No Transportation Needs (12/14/2022)   PRAPARE - Administrator, Civil Service (Medical): No    Lack of Transportation (Non-Medical): No  Physical Activity: Insufficiently Active (06/24/2022)   Exercise Vital Sign    Days of Exercise per Week: 3 days    Minutes of Exercise per Session: 30 min  Stress: No Stress Concern Present (06/24/2022)   [employer] of Occupational Health - Occupational Stress Questionnaire    Feeling of Stress : Not at all  Social Connections: Moderately Integrated (06/24/2022)   Social Connection and Isolation Panel [NHANES]    Frequency of Communication with Friends and Family: More than three times a week    Frequency of Social Gatherings with Friends and Family: More than three times a week    Attends Religious Services: More than 4 times per year    Active Member of [employer] or Organizations: Yes    Attends Banker Meetings: More than 4 times per year    Marital  Status: Widowed  Intimate Partner Violence: Not At Risk (12/14/2022)   Humiliation, Afraid, Rape, and Kick questionnaire    Fear of Current or Ex-Partner: No    Emotionally Abused: No    Physically Abused: No    Sexually Abused: No    Tobacco Use: Low Risk  (01/21/2023)   Patient History    Smoking Tobacco Use: Never    Smokeless Tobacco Use: Never    Passive Exposure: Not on file   Social History   Substance and Sexual Activity  Alcohol Use No    Family History  Problem Relation Age of Onset   COPD Father     ROS  Objective:  Physical Exam: - Well-developed female, alert and oriented, no apparent distress.   - Left knee No effusion. Range  of motion is 0-125. Crepitus on range of motion. Tender medially. No lateral tenderness or instability.  IMAGING:  Radiographs of the patient's knees were taken. The AP and lateral views of the left knee, AP of both knees, and lateral of the right knee were examined. The prosthesis on the right knee is in an excellent position with no periprosthetic abnormalities. The left knee demonstrates severe bone-on-bone in the medial compartment. A previous lateral view of the left knee also showed bone-on-bone patellofemoral.  Assessment/Plan:  End stage arthritis, left knee   The patient history, physical examination, clinical judgment of the provider and imaging studies are consistent with end stage degenerative joint disease of the left knee and total knee arthroplasty is deemed medically necessary. The treatment options including medical management, injection therapy arthroscopy and arthroplasty were discussed at length. The risks and benefits of total knee arthroplasty were presented and reviewed. The risks due to aseptic loosening, infection, stiffness, patella tracking problems, thromboembolic complications and other imponderables were discussed. The patient acknowledged the explanation, agreed to proceed with the plan and consent was signed.  Patient is being admitted for inpatient treatment for surgery, pain control, PT, OT, prophylactic antibiotics, VTE prophylaxis, progressive ambulation and ADLs and discharge planning. The patient is planning to be discharged home.   Patient's anticipated LOS is less than 2 midnights, meeting these requirements: - Younger than 62 - Lives within 1 hour of care - Has a competent adult at home to recover with post-op recover - NO history of  - Chronic pain requiring opiods  - Diabetes  - Coronary Artery Disease  - Heart failure  - Heart attack  - DVT/VTE  - Cardiac arrhythmia  - Respiratory Failure/COPD  - Renal failure  - Anemia  - Advanced Liver disease  Therapy Plans: [facility] Disposition: Home with granddaughter Planned DVT Prophylaxis: Plavix + ASA 325mg  QD DME Needed: None PCP: Mary-Margaret Daphine Deutscher, FNP (clearance received) TXA: TOPICAL Allergies: NKDA Anesthesia Concerns: Nausea BMI: 35.1 Last HgbA1c: Not diabetic Pharmacy: [facility] in [place]  Other: -Stop Plavix 5 days prior to surgery and restart on DOS, per PCP  - Patient was instructed on what medications to stop prior to surgery. - Follow-up visit in 2 weeks with Dr. Lequita Halt - Begin physical therapy following surgery - Pre-operative lab work as pre-surgical testing - Prescriptions will be provided in hospital at time of discharge  Weston Brass, PA-C Orthopedic Surgery [facility] [place]

## 2023-03-18 ENCOUNTER — Encounter (HOSPITAL_COMMUNITY)
Admission: RE | Admit: 2023-03-18 | Discharge: 2023-03-18 | Disposition: A | Discharge status: Home / Self Care | Payer: Medicare Other | Source: Ambulatory Visit | Attending: Orthopedic Surgery | Admitting: Orthopedic Surgery

## 2023-03-18 ENCOUNTER — Other Ambulatory Visit: Payer: Self-pay

## 2023-03-18 ENCOUNTER — Encounter (HOSPITAL_COMMUNITY): Payer: Self-pay

## 2023-03-18 VITALS — BP 145/69 | HR 75 | Temp 98.2°F | Resp 16 | Ht 66.0 in | Wt 210.0 lb

## 2023-03-18 DIAGNOSIS — Z01818 Encounter for other preprocedural examination: Secondary | ICD-10-CM | POA: Diagnosis present

## 2023-03-18 DIAGNOSIS — I10 Essential (primary) hypertension: Secondary | ICD-10-CM | POA: Diagnosis not present

## 2023-03-18 DIAGNOSIS — Z01812 Encounter for preprocedural laboratory examination: Secondary | ICD-10-CM | POA: Insufficient documentation

## 2023-03-18 HISTORY — DX: Unspecified osteoarthritis, unspecified site: M19.90

## 2023-03-18 HISTORY — DX: Cerebral infarction, unspecified: I63.9

## 2023-03-18 LAB — SURGICAL PCR SCREEN
MRSA, PCR: NEGATIVE
Staphylococcus aureus: NEGATIVE

## 2023-03-18 LAB — CBC
HCT: 43.5 % (ref 36.0–46.0)
Hemoglobin: 14.2 g/dL (ref 12.0–15.0)
MCH: 29 pg (ref 26.0–34.0)
MCHC: 32.6 g/dL (ref 30.0–36.0)
MCV: 89 fL (ref 80.0–100.0)
Platelets: 229 10*3/uL (ref 150–400)
RBC: 4.89 MIL/uL (ref 3.87–5.11)
RDW: 14.2 % (ref 11.5–15.5)
WBC: 7.4 10*3/uL (ref 4.0–10.5)
nRBC: 0 % (ref 0.0–0.2)

## 2023-03-18 NOTE — Progress Notes (Addendum)
PCP - Bennie Pierini   clearance 12-01-22 for previous surgery Cardiologist - Foster Simpson, MD  LOV 04-29-22 epic Neuro- Emmie Niemann, NP  LOV 08-04-22 epic  PPM/ICD -  Device Orders -  Rep Notified -   Chest x-ray - 11-02-22 epic EKG - 11-02-22 epic Stress Test -  ECHO - 10-24-21 CE Cardiac Cath -  Monitor- 04-03-23 CE  Sleep Study -  CPAP -   Fasting Blood Sugar -  Checks Blood Sugar _____ times a day  Blood Thinner Instructions:Plavix  stop 5 days Aspirin Instructions:  ERAS Protcol - PRE-SURGERY Ensure or G2-    COVID vaccine -  Activity--Able to complete ADL's with no cp or SOB  Anesthesia review: HTN, CVA, CKD  Patient denies shortness of breath, fever, cough and chest pain at PAT appointment   All instructions explained to the patient, with a verbal understanding of the material. Patient agrees to go over the instructions while at home for a better understanding. Patient also instructed to self quarantine after being tested for COVID-19. The opportunity to ask questions was provided.

## 2023-03-19 ENCOUNTER — Ambulatory Visit: Payer: Medicare Other

## 2023-03-19 NOTE — Anesthesia Preprocedure Evaluation (Addendum)
Anesthesia Evaluation  Patient identified by MRN, date of birth, ID band Patient awake    Reviewed: Allergy & Precautions, NPO status , Patient's Chart, lab work & pertinent test results, reviewed documented beta blocker date and time   History of Anesthesia Complications (+) PONV and history of anesthetic complications  Airway Mallampati: III  TM Distance: >3 FB Neck ROM: Limited    Dental no notable dental hx. (+) Dental Advisory Given   Pulmonary neg pulmonary ROS   Pulmonary exam normal breath sounds clear to auscultation       Cardiovascular hypertension, Pt. on medications Normal cardiovascular exam Rhythm:Regular Rate:Normal     Neuro/Psych CVA, Residual Symptoms  negative psych ROS   GI/Hepatic negative GI ROS,,,  Endo/Other  Hypothyroidism  Obesity HLD  Renal/GU Renal InsufficiencyRenal disease  negative genitourinary   Musculoskeletal  (+) Arthritis , Osteoarthritis,  OA Left knee   Abdominal  (+) + obese  Peds  Hematology Plavix therapy- last dose 10/1   Anesthesia Other Findings   Reproductive/Obstetrics                             Anesthesia Physical Anesthesia Plan  ASA: 3  Anesthesia Plan: Spinal   Post-op Pain Management: Regional block* and Minimal or no pain anticipated   Induction: Intravenous  PONV Risk Score and Plan: 4 or greater and Treatment may vary due to age or medical condition, Propofol infusion, Ondansetron and Dexamethasone  Airway Management Planned: Natural Airway  Additional Equipment: None  Intra-op Plan:   Post-operative Plan:   Informed Consent: I have reviewed the patients History and Physical, chart, labs and discussed the procedure including the risks, benefits and alternatives for the proposed anesthesia with the patient or authorized representative who has indicated his/her understanding and acceptance.     Dental advisory  given  Plan Discussed with: CRNA and Anesthesiologist  Anesthesia Plan Comments: (See PAT note from 9/26 by Sherlie Ban PA-C )        Anesthesia Quick Evaluation

## 2023-03-19 NOTE — Progress Notes (Signed)
DISCUSSION: Kimberly Walsh is a 77 yo female who presents to PAT prior to L TKA. PMH of HTN, hx of TIA, hypothyroidism, vertigo, arthritis s/p R TKA on 12/14/22.  Prior anesthesia complications include PONV. Surgery for R TKA in June was uncomplicated.  Patient follows with Neurology for subacute cerebellar ischemic stroke versus artifact in 12/2018. Last seen on 08/04/22. TTE revealed evidence of severe posterior mitral annular calcification ( potential for thrombus formation), which can be a cause of recurrent embolic strokes. Originally she was on anticoagulation, subsequent cardiology follow up she was switched to plavix.   Patient follows with Cardiology for hx of severe mitral annular calcification which is being treated with Plavix. Last seen on 04/29/2022. Advised continue present management. F/u in 1 year.  Will hold Plavix x 5 days  VS: BP (!) 145/69   Pulse 75   Temp 36.8 C (Oral)   Resp 16   Ht 5\' 6"  (1.676 m)   Wt 95.3 kg   SpO2 100%   BMI 33.89 kg/m   PROVIDERS: Bennie Pierini, FNP Neurology: Emmie Niemann, NP Cardiology: Foster Simpson, MD  LABS: Labs reviewed: Acceptable for surgery. (all labs ordered are listed, but only abnormal results are displayed)  Labs Reviewed  SURGICAL PCR SCREEN  CBC     IMAGES:   EKG:   CV:  Echo 10/24/2021:  SUMMARY  The left ventricular size is normal with moderate concentric  hypertrophy.  Left ventricular systolic function is normal.  LV ejection fraction = 60-65%.  The left atrium is mildly dilated.  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  There is no significant valvular stenosis or regurgitation.  The aortic sinus is normal size.  IVC size was normal.  There is no pericardial effusion.  Probably no significant change in comparison with the prior study  noted    Past Medical History:  Diagnosis Date   Arthritis    Cancer (HCC) 2016   skin cancer    Hyperlipidemia    Hypertension    Hypothyroidism    PONV (postoperative nausea and vomiting)    Stroke (HCC)    per pt. stroke was ruled out still on plavix   Venous stasis    Vertigo     Past Surgical History:  Procedure Laterality Date   basal carcinoma rt leg     CHOLECYSTECTOMY     FINGER SURGERY Left    Ring finger   MIDDLE EAR SURGERY     Fungus   TOTAL KNEE ARTHROPLASTY Right 12/14/2022   Procedure: TOTAL KNEE ARTHROPLASTY;  Surgeon: Ollen Gross, MD;  Location: [facility];  Service: Orthopedics;  Laterality: Right;   TUBAL LIGATION     VEIN SURGERY      MEDICATIONS:  acetaminophen (TYLENOL) 500 MG tablet   atorvastatin (LIPITOR) 40 MG tablet   clopidogrel (PLAVIX) 75 MG tablet   furosemide (LASIX) 40 MG tablet   levothyroxine (SYNTHROID) 50 MCG tablet   losartan (COZAAR) 25 MG tablet   meclizine (ANTIVERT) 25 MG tablet   methocarbamol (ROBAXIN) 500 MG tablet   ondansetron (ZOFRAN) 4 MG tablet   oxyCODONE (OXY IR/ROXICODONE) 5 MG immediate release tablet   traMADol (ULTRAM) 50 MG tablet    cyanocobalamin (VITAMIN B12) injection 1,000 mcg   Marcille Blanco [facility] Short Stay/Anesthesiology [facility] Phone [phone] 03/19/2023 12:01 PM

## 2023-03-25 ENCOUNTER — Ambulatory Visit: Payer: Medicare Other

## 2023-03-25 DIAGNOSIS — E538 Deficiency of other specified B group vitamins: Secondary | ICD-10-CM | POA: Diagnosis not present

## 2023-03-25 NOTE — Progress Notes (Signed)
B - 12 injection given ; left deltoid ; patient tolerated well

## 2023-03-26 ENCOUNTER — Other Ambulatory Visit: Payer: Self-pay | Admitting: Nurse Practitioner

## 2023-03-26 DIAGNOSIS — Z1211 Encounter for screening for malignant neoplasm of colon: Secondary | ICD-10-CM

## 2023-03-26 DIAGNOSIS — Z1212 Encounter for screening for malignant neoplasm of rectum: Secondary | ICD-10-CM

## 2023-03-28 ENCOUNTER — Encounter (HOSPITAL_COMMUNITY): Payer: Self-pay | Admitting: Orthopedic Surgery

## 2023-03-29 ENCOUNTER — Ambulatory Visit (HOSPITAL_COMMUNITY): Payer: Self-pay | Admitting: Anesthesiology

## 2023-03-29 ENCOUNTER — Encounter (HOSPITAL_COMMUNITY): Payer: Self-pay | Admitting: Orthopedic Surgery

## 2023-03-29 ENCOUNTER — Other Ambulatory Visit: Payer: Self-pay

## 2023-03-29 ENCOUNTER — Observation Stay (HOSPITAL_COMMUNITY)
Admission: RE | Admit: 2023-03-29 | Discharge: 2023-03-30 | Disposition: A | Discharge status: Home / Self Care | Payer: Medicare Other | Source: Ambulatory Visit | Attending: Orthopedic Surgery | Admitting: Orthopedic Surgery

## 2023-03-29 ENCOUNTER — Ambulatory Visit (HOSPITAL_COMMUNITY): Payer: Medicare Other | Admitting: Medical

## 2023-03-29 ENCOUNTER — Encounter (HOSPITAL_COMMUNITY)
Admission: RE | Disposition: A | Discharge status: Home / Self Care | Payer: Self-pay | Source: Ambulatory Visit | Attending: Orthopedic Surgery

## 2023-03-29 DIAGNOSIS — Z7902 Long term (current) use of antithrombotics/antiplatelets: Secondary | ICD-10-CM | POA: Diagnosis not present

## 2023-03-29 DIAGNOSIS — Z79899 Other long term (current) drug therapy: Secondary | ICD-10-CM | POA: Diagnosis not present

## 2023-03-29 DIAGNOSIS — I10 Essential (primary) hypertension: Secondary | ICD-10-CM

## 2023-03-29 DIAGNOSIS — G8918 Other acute postprocedural pain: Secondary | ICD-10-CM | POA: Diagnosis not present

## 2023-03-29 DIAGNOSIS — E039 Hypothyroidism, unspecified: Secondary | ICD-10-CM | POA: Diagnosis not present

## 2023-03-29 DIAGNOSIS — Z96651 Presence of right artificial knee joint: Secondary | ICD-10-CM | POA: Diagnosis not present

## 2023-03-29 DIAGNOSIS — Z8673 Personal history of transient ischemic attack (TIA), and cerebral infarction without residual deficits: Secondary | ICD-10-CM | POA: Diagnosis not present

## 2023-03-29 DIAGNOSIS — N183 Chronic kidney disease, stage 3 unspecified: Secondary | ICD-10-CM | POA: Diagnosis not present

## 2023-03-29 DIAGNOSIS — Z85828 Personal history of other malignant neoplasm of skin: Secondary | ICD-10-CM | POA: Insufficient documentation

## 2023-03-29 DIAGNOSIS — M179 Osteoarthritis of knee, unspecified: Principal | ICD-10-CM | POA: Diagnosis present

## 2023-03-29 DIAGNOSIS — M1712 Unilateral primary osteoarthritis, left knee: Secondary | ICD-10-CM | POA: Diagnosis not present

## 2023-03-29 DIAGNOSIS — I129 Hypertensive chronic kidney disease with stage 1 through stage 4 chronic kidney disease, or unspecified chronic kidney disease: Secondary | ICD-10-CM | POA: Diagnosis not present

## 2023-03-29 HISTORY — PX: TOTAL KNEE ARTHROPLASTY: SHX125

## 2023-03-29 LAB — BASIC METABOLIC PANEL
Anion gap: 9 (ref 5–15)
BUN: 19 mg/dL (ref 8–23)
CO2: 23 mmol/L (ref 22–32)
Calcium: 8.7 mg/dL — ABNORMAL LOW (ref 8.9–10.3)
Chloride: 106 mmol/L (ref 98–111)
Creatinine, Ser: 1.25 mg/dL — ABNORMAL HIGH (ref 0.44–1.00)
GFR, Estimated: 45 mL/min — ABNORMAL LOW (ref >60)
Glucose, Bld: 88 mg/dL (ref 70–99)
Potassium: 3.7 mmol/L (ref 3.5–5.1)
Sodium: 138 mmol/L (ref 135–145)

## 2023-03-29 SURGERY — ARTHROPLASTY, KNEE, TOTAL
Anesthesia: Spinal | Site: Knee | Laterality: Left

## 2023-03-29 MED ORDER — ATORVASTATIN CALCIUM 40 MG PO TABS
40.0000 mg | ORAL_TABLET | Freq: Every day | ORAL | Status: DC
  Administered 2023-03-30: 40 mg via ORAL
  Filled 2023-03-29: qty 1

## 2023-03-29 MED ORDER — DOCUSATE SODIUM 100 MG PO CAPS
100.0000 mg | ORAL_CAPSULE | Freq: Two times a day (BID) | ORAL | Status: DC
  Administered 2023-03-29 – 2023-03-30 (×3): 100 mg via ORAL
  Filled 2023-03-29 (×3): qty 1

## 2023-03-29 MED ORDER — BUPIVACAINE LIPOSOME 1.3 % IJ SUSP: 20.0000 mL | Freq: Once | INTRAMUSCULAR | Status: DC

## 2023-03-29 MED ORDER — DEXAMETHASONE SODIUM PHOSPHATE 10 MG/ML IJ SOLN: 8.0000 mg | Freq: Once | INTRAMUSCULAR | Status: DC

## 2023-03-29 MED ORDER — MENTHOL 3 MG MT LOZG: 1.0000 | LOZENGE | OROMUCOSAL | Status: DC | PRN

## 2023-03-29 MED ORDER — ONDANSETRON HCL 4 MG/2ML IJ SOLN
INTRAMUSCULAR | Status: DC | PRN
  Administered 2023-03-29: 4 mg via INTRAVENOUS

## 2023-03-29 MED ORDER — 0.9 % SODIUM CHLORIDE (POUR BTL) OPTIME
TOPICAL | Status: DC | PRN
  Administered 2023-03-29: 1000 mL

## 2023-03-29 MED ORDER — OXYCODONE HCL 5 MG/5ML PO SOLN: 5.0000 mg | Freq: Once | ORAL | Status: DC | PRN

## 2023-03-29 MED ORDER — FENTANYL CITRATE (PF) 100 MCG/2ML IJ SOLN
INTRAMUSCULAR | Status: DC | PRN
  Administered 2023-03-29 (×2): 50 ug via INTRAVENOUS

## 2023-03-29 MED ORDER — CEFAZOLIN SODIUM-DEXTROSE 2-4 GM/100ML-% IV SOLN
2.0000 g | INTRAVENOUS | Status: AC
  Administered 2023-03-29: 2 g via INTRAVENOUS
  Filled 2023-03-29: qty 100

## 2023-03-29 MED ORDER — PROPOFOL 1000 MG/100ML IV EMUL
INTRAVENOUS | Status: AC
  Filled 2023-03-29: qty 100

## 2023-03-29 MED ORDER — ONDANSETRON HCL 4 MG/2ML IJ SOLN
INTRAMUSCULAR | Status: AC
  Filled 2023-03-29: qty 6

## 2023-03-29 MED ORDER — POVIDONE-IODINE 10 % EX SWAB: 2.0000 | Freq: Once | CUTANEOUS | Status: DC

## 2023-03-29 MED ORDER — STERILE WATER FOR IRRIGATION IR SOLN
Status: DC | PRN
  Administered 2023-03-29: 2000 mL

## 2023-03-29 MED ORDER — SODIUM CHLORIDE (PF) 0.9 % IJ SOLN
INTRAMUSCULAR | Status: DC | PRN
  Administered 2023-03-29: 80 mL

## 2023-03-29 MED ORDER — LIDOCAINE HCL (PF) 2 % IJ SOLN
INTRAMUSCULAR | Status: AC
  Filled 2023-03-29: qty 15

## 2023-03-29 MED ORDER — METHOCARBAMOL 500 MG IVPB - SIMPLE MED: 500.0000 mg | Freq: Four times a day (QID) | INTRAVENOUS | Status: DC | PRN

## 2023-03-29 MED ORDER — ACETAMINOPHEN 500 MG PO TABS
1000.0000 mg | ORAL_TABLET | Freq: Four times a day (QID) | ORAL | Status: AC
  Administered 2023-03-29 – 2023-03-30 (×4): 1000 mg via ORAL
  Filled 2023-03-29 (×4): qty 2

## 2023-03-29 MED ORDER — OXYCODONE HCL 5 MG PO TABS
10.0000 mg | ORAL_TABLET | ORAL | Status: DC | PRN
  Administered 2023-03-30: 15 mg via ORAL
  Filled 2023-03-29: qty 3

## 2023-03-29 MED ORDER — ORAL CARE MOUTH RINSE: 15.0000 mL | Freq: Once | OROMUCOSAL | Status: AC

## 2023-03-29 MED ORDER — ONDANSETRON HCL 4 MG PO TABS: 4.0000 mg | ORAL_TABLET | Freq: Four times a day (QID) | ORAL | Status: DC | PRN

## 2023-03-29 MED ORDER — ONDANSETRON HCL 4 MG/2ML IJ SOLN: 4.0000 mg | Freq: Once | INTRAMUSCULAR | Status: DC | PRN

## 2023-03-29 MED ORDER — ASPIRIN 325 MG PO TBEC
325.0000 mg | DELAYED_RELEASE_TABLET | Freq: Every day | ORAL | Status: DC
  Administered 2023-03-30: 325 mg via ORAL
  Filled 2023-03-29: qty 1

## 2023-03-29 MED ORDER — LOSARTAN POTASSIUM 25 MG PO TABS
25.0000 mg | ORAL_TABLET | Freq: Every day | ORAL | Status: DC
  Administered 2023-03-30: 25 mg via ORAL
  Filled 2023-03-29: qty 1

## 2023-03-29 MED ORDER — MIDAZOLAM HCL 5 MG/5ML IJ SOLN
INTRAMUSCULAR | Status: DC | PRN
  Administered 2023-03-29 (×2): 1 mg via INTRAVENOUS

## 2023-03-29 MED ORDER — DIPHENHYDRAMINE HCL 12.5 MG/5ML PO ELIX: 12.5000 mg | ORAL_SOLUTION | ORAL | Status: DC | PRN

## 2023-03-29 MED ORDER — DEXAMETHASONE SODIUM PHOSPHATE 10 MG/ML IJ SOLN
INTRAMUSCULAR | Status: DC | PRN
  Administered 2023-03-29: 6 mg via INTRAVENOUS

## 2023-03-29 MED ORDER — EPHEDRINE SULFATE-NACL 50-0.9 MG/10ML-% IV SOSY
PREFILLED_SYRINGE | INTRAVENOUS | Status: DC | PRN
  Administered 2023-03-29 (×2): 10 mg via INTRAVENOUS

## 2023-03-29 MED ORDER — INFLUENZA VAC A&B SURF ANT ADJ 0.5 ML IM SUSY
0.5000 mL | PREFILLED_SYRINGE | INTRAMUSCULAR | Status: DC
  Filled 2023-03-29: qty 0.5

## 2023-03-29 MED ORDER — FENTANYL CITRATE (PF) 100 MCG/2ML IJ SOLN
INTRAMUSCULAR | Status: AC
  Filled 2023-03-29: qty 2

## 2023-03-29 MED ORDER — ROPIVACAINE HCL 5 MG/ML IJ SOLN
INTRAMUSCULAR | Status: DC | PRN
Start: 2023-03-29 — End: 2023-03-29
  Administered 2023-03-29: 30 mL via PERINEURAL

## 2023-03-29 MED ORDER — BISACODYL 10 MG RE SUPP: 10.0000 mg | Freq: Every day | RECTAL | Status: DC | PRN

## 2023-03-29 MED ORDER — BUPIVACAINE IN DEXTROSE 0.75-8.25 % IT SOLN
INTRATHECAL | Status: DC | PRN
  Administered 2023-03-29: 1.7 mL via INTRATHECAL

## 2023-03-29 MED ORDER — SODIUM CHLORIDE 0.9 % IV SOLN: INTRAVENOUS | Status: DC

## 2023-03-29 MED ORDER — HYDROMORPHONE HCL 1 MG/ML IJ SOLN: 0.2500 mg | INTRAMUSCULAR | Status: DC | PRN

## 2023-03-29 MED ORDER — BUPIVACAINE LIPOSOME 1.3 % IJ SUSP
INTRAMUSCULAR | Status: AC
  Filled 2023-03-29: qty 20

## 2023-03-29 MED ORDER — MECLIZINE HCL 25 MG PO TABS
25.0000 mg | ORAL_TABLET | Freq: Three times a day (TID) | ORAL | Status: DC | PRN
  Administered 2023-03-29 – 2023-03-30 (×2): 25 mg via ORAL
  Filled 2023-03-29 (×2): qty 1

## 2023-03-29 MED ORDER — SODIUM CHLORIDE 0.9 % IR SOLN
Status: DC | PRN
  Administered 2023-03-29: 1000 mL

## 2023-03-29 MED ORDER — EPHEDRINE 5 MG/ML INJ
INTRAVENOUS | Status: AC
  Filled 2023-03-29: qty 5

## 2023-03-29 MED ORDER — CLOPIDOGREL BISULFATE 75 MG PO TABS
75.0000 mg | ORAL_TABLET | Freq: Every day | ORAL | Status: DC
  Administered 2023-03-30: 75 mg via ORAL
  Filled 2023-03-29: qty 1

## 2023-03-29 MED ORDER — CHLORHEXIDINE GLUCONATE 0.12 % MT SOLN
15.0000 mL | Freq: Once | OROMUCOSAL | Status: AC
  Administered 2023-03-29: 15 mL via OROMUCOSAL

## 2023-03-29 MED ORDER — HYDROMORPHONE HCL 1 MG/ML IJ SOLN
0.5000 mg | INTRAMUSCULAR | Status: DC | PRN
  Administered 2023-03-29 (×4): 1 mg via INTRAVENOUS
  Filled 2023-03-29 (×4): qty 1

## 2023-03-29 MED ORDER — DEXAMETHASONE SODIUM PHOSPHATE 10 MG/ML IJ SOLN
10.0000 mg | Freq: Once | INTRAMUSCULAR | Status: AC
  Administered 2023-03-30: 10 mg via INTRAVENOUS
  Filled 2023-03-29: qty 1

## 2023-03-29 MED ORDER — PHENYLEPHRINE 80 MCG/ML (10ML) SYRINGE FOR IV PUSH (FOR BLOOD PRESSURE SUPPORT)
PREFILLED_SYRINGE | INTRAVENOUS | Status: DC | PRN
  Administered 2023-03-29 (×4): 120 ug via INTRAVENOUS
  Administered 2023-03-29: 80 ug via INTRAVENOUS
  Administered 2023-03-29 (×3): 120 ug via INTRAVENOUS

## 2023-03-29 MED ORDER — METOCLOPRAMIDE HCL 5 MG/ML IJ SOLN
5.0000 mg | Freq: Three times a day (TID) | INTRAMUSCULAR | Status: DC | PRN
  Administered 2023-03-29: 10 mg via INTRAVENOUS
  Filled 2023-03-29: qty 2

## 2023-03-29 MED ORDER — MORPHINE SULFATE (PF) 2 MG/ML IV SOLN: 1.0000 mg | INTRAVENOUS | Status: DC | PRN

## 2023-03-29 MED ORDER — LACTATED RINGERS IV SOLN: INTRAVENOUS | Status: DC

## 2023-03-29 MED ORDER — PROPOFOL 10 MG/ML IV BOLUS
INTRAVENOUS | Status: AC
  Filled 2023-03-29: qty 20

## 2023-03-29 MED ORDER — ONDANSETRON HCL 4 MG/2ML IJ SOLN
4.0000 mg | Freq: Four times a day (QID) | INTRAMUSCULAR | Status: DC | PRN
  Administered 2023-03-29 (×2): 4 mg via INTRAVENOUS
  Filled 2023-03-29 (×2): qty 2

## 2023-03-29 MED ORDER — PHENYLEPHRINE 80 MCG/ML (10ML) SYRINGE FOR IV PUSH (FOR BLOOD PRESSURE SUPPORT)
PREFILLED_SYRINGE | INTRAVENOUS | Status: AC
  Filled 2023-03-29: qty 10

## 2023-03-29 MED ORDER — POLYETHYLENE GLYCOL 3350 17 G PO PACK: 17.0000 g | PACK | Freq: Every day | ORAL | Status: DC | PRN

## 2023-03-29 MED ORDER — ACETAMINOPHEN 10 MG/ML IV SOLN
1000.0000 mg | Freq: Four times a day (QID) | INTRAVENOUS | Status: DC
  Administered 2023-03-29: 1000 mg via INTRAVENOUS
  Filled 2023-03-29: qty 100

## 2023-03-29 MED ORDER — SODIUM CHLORIDE (PF) 0.9 % IJ SOLN
INTRAMUSCULAR | Status: AC
  Filled 2023-03-29: qty 10

## 2023-03-29 MED ORDER — CEFAZOLIN SODIUM-DEXTROSE 2-4 GM/100ML-% IV SOLN
2.0000 g | Freq: Four times a day (QID) | INTRAVENOUS | Status: AC
  Administered 2023-03-29 (×2): 2 g via INTRAVENOUS
  Filled 2023-03-29 (×2): qty 100

## 2023-03-29 MED ORDER — TRANEXAMIC ACID-NACL 1000-0.7 MG/100ML-% IV SOLN
1000.0000 mg | INTRAVENOUS | Status: AC
  Administered 2023-03-29: 1000 mg via INTRAVENOUS
  Filled 2023-03-29: qty 100

## 2023-03-29 MED ORDER — TRAMADOL HCL 50 MG PO TABS
50.0000 mg | ORAL_TABLET | Freq: Four times a day (QID) | ORAL | Status: DC | PRN
  Administered 2023-03-30: 50 mg via ORAL
  Filled 2023-03-29: qty 1

## 2023-03-29 MED ORDER — SODIUM CHLORIDE (PF) 0.9 % IJ SOLN
INTRAMUSCULAR | Status: AC
  Filled 2023-03-29: qty 50

## 2023-03-29 MED ORDER — MIDAZOLAM HCL 2 MG/2ML IJ SOLN
INTRAMUSCULAR | Status: AC
  Filled 2023-03-29: qty 2

## 2023-03-29 MED ORDER — PHENOL 1.4 % MT LIQD: 1.0000 | OROMUCOSAL | Status: DC | PRN

## 2023-03-29 MED ORDER — METOCLOPRAMIDE HCL 5 MG PO TABS: 5.0000 mg | ORAL_TABLET | Freq: Three times a day (TID) | ORAL | Status: DC | PRN

## 2023-03-29 MED ORDER — METHOCARBAMOL 500 MG PO TABS
500.0000 mg | ORAL_TABLET | Freq: Four times a day (QID) | ORAL | Status: DC | PRN
  Administered 2023-03-29: 500 mg via ORAL
  Filled 2023-03-29: qty 1

## 2023-03-29 MED ORDER — FLEET ENEMA RE ENEM: 1.0000 | ENEMA | Freq: Once | RECTAL | Status: DC | PRN

## 2023-03-29 MED ORDER — PROPOFOL 10 MG/ML IV BOLUS
INTRAVENOUS | Status: DC | PRN
  Administered 2023-03-29: 75 ug/kg/min via INTRAVENOUS

## 2023-03-29 MED ORDER — OXYCODONE HCL 5 MG PO TABS: 5.0000 mg | ORAL_TABLET | Freq: Once | ORAL | Status: DC | PRN

## 2023-03-29 MED ORDER — DEXAMETHASONE SODIUM PHOSPHATE 10 MG/ML IJ SOLN
INTRAMUSCULAR | Status: AC
  Filled 2023-03-29: qty 3

## 2023-03-29 MED ORDER — OXYCODONE HCL 5 MG PO TABS
5.0000 mg | ORAL_TABLET | ORAL | Status: DC | PRN
  Administered 2023-03-29: 5 mg via ORAL
  Administered 2023-03-29: 10 mg via ORAL
  Administered 2023-03-29: 5 mg via ORAL
  Administered 2023-03-30 (×2): 10 mg via ORAL
  Filled 2023-03-29: qty 1
  Filled 2023-03-29: qty 2
  Filled 2023-03-29: qty 1
  Filled 2023-03-29 (×2): qty 2

## 2023-03-29 MED ORDER — LEVOTHYROXINE SODIUM 50 MCG PO TABS
50.0000 ug | ORAL_TABLET | Freq: Every day | ORAL | Status: DC
  Administered 2023-03-30: 50 ug via ORAL
  Filled 2023-03-29: qty 1

## 2023-03-29 SURGICAL SUPPLY — 52 items
ADH SKN CLS APL DERMABOND .7 (GAUZE/BANDAGES/DRESSINGS) ×1
ATTUNE MED DOME PAT 38 KNEE (Knees) IMPLANT
ATTUNE PS FEM LT SZ 5 CEM KNEE (Femur) IMPLANT
ATTUNE PSRP INSE SZ5 7 KNEE (Insert) IMPLANT
BAG COUNTER SPONGE SURGICOUNT (BAG) IMPLANT
BAG SPEC THK2 15X12 ZIP CLS (MISCELLANEOUS) ×1
BAG SPNG CNTER NS LX DISP (BAG)
BAG ZIPLOCK 12X15 (MISCELLANEOUS) ×2 IMPLANT
BASEPLATE TIBIAL ROTATING SZ 4 (Knees) IMPLANT
BLADE SAG 18X100X1.27 (BLADE) ×2 IMPLANT
BLADE SAW SGTL 11.0X1.19X90.0M (BLADE) ×2 IMPLANT
BNDG CMPR 6 X 5 YARDS HK CLSR (GAUZE/BANDAGES/DRESSINGS) ×1
BNDG ELASTIC 6INX 5YD STR LF (GAUZE/BANDAGES/DRESSINGS) ×2 IMPLANT
BOWL SMART MIX CTS (DISPOSABLE) ×2 IMPLANT
BSPLAT TIB 4 CMNT ROT PLAT STR (Knees) ×1 IMPLANT
CEMENT HV SMART SET (Cement) ×4 IMPLANT
COVER SURGICAL LIGHT HANDLE (MISCELLANEOUS) ×2 IMPLANT
CUFF TOURN SGL QUICK 34 (TOURNIQUET CUFF) ×1
CUFF TRNQT CYL 34X4.125X (TOURNIQUET CUFF) ×2 IMPLANT
DERMABOND ADVANCED .7 DNX12 (GAUZE/BANDAGES/DRESSINGS) ×2 IMPLANT
DRAPE U-SHAPE 47X51 STRL (DRAPES) ×2 IMPLANT
DRSG AQUACEL AG ADV 3.5X10 (GAUZE/BANDAGES/DRESSINGS) ×2 IMPLANT
DURAPREP 26ML APPLICATOR (WOUND CARE) ×2 IMPLANT
ELECT REM PT RETURN 15FT ADLT (MISCELLANEOUS) ×2 IMPLANT
GLOVE BIO SURGEON STRL SZ 6.5 (GLOVE) IMPLANT
GLOVE BIO SURGEON STRL SZ8 (GLOVE) ×2 IMPLANT
GLOVE BIOGEL PI IND STRL 6.5 (GLOVE) IMPLANT
GLOVE BIOGEL PI IND STRL 7.0 (GLOVE) IMPLANT
GLOVE BIOGEL PI IND STRL 8 (GLOVE) ×2 IMPLANT
GOWN STRL REUS W/ TWL LRG LVL3 (GOWN DISPOSABLE) ×2 IMPLANT
GOWN STRL REUS W/TWL LRG LVL3 (GOWN DISPOSABLE) ×1
HANDPIECE INTERPULSE COAX TIP (DISPOSABLE) ×1
HOLDER FOLEY CATH W/STRAP (MISCELLANEOUS) IMPLANT
IMMOBILIZER KNEE 20 (SOFTGOODS) ×1
IMMOBILIZER KNEE 20 THIGH 36 (SOFTGOODS) ×2 IMPLANT
KIT TURNOVER KIT A (KITS) IMPLANT
MANIFOLD NEPTUNE II (INSTRUMENTS) ×2 IMPLANT
NS IRRIG 1000ML POUR BTL (IV SOLUTION) ×2 IMPLANT
PACK TOTAL KNEE CUSTOM (KITS) ×2 IMPLANT
PADDING CAST COTTON 6X4 STRL (CAST SUPPLIES) ×4 IMPLANT
PIN STEINMAN FIXATION KNEE (PIN) IMPLANT
PROTECTOR NERVE ULNAR (MISCELLANEOUS) ×2 IMPLANT
SET HNDPC FAN SPRY TIP SCT (DISPOSABLE) ×2 IMPLANT
SUT MNCRL AB 4-0 PS2 18 (SUTURE) ×2 IMPLANT
SUT STRATAFIX 0 PDS 27 VIOLET (SUTURE) ×1
SUT VIC AB 2-0 CT1 27 (SUTURE) ×3
SUT VIC AB 2-0 CT1 TAPERPNT 27 (SUTURE) ×6 IMPLANT
SUTURE STRATFX 0 PDS 27 VIOLET (SUTURE) ×2 IMPLANT
TRAY FOLEY MTR SLVR 16FR STAT (SET/KITS/TRAYS/PACK) IMPLANT
TUBE SUCTION HIGH CAP CLEAR NV (SUCTIONS) ×2 IMPLANT
WATER STERILE IRR 1000ML POUR (IV SOLUTION) ×4 IMPLANT
WRAP KNEE MAXI GEL POST OP (GAUZE/BANDAGES/DRESSINGS) ×2 IMPLANT

## 2023-03-29 NOTE — Op Note (Signed)
OPERATIVE REPORT-TOTAL KNEE ARTHROPLASTY   Pre-operative diagnosis- Osteoarthritis  Left knee(s)  Post-operative diagnosis- Osteoarthritis Left knee(s)  Procedure-  Left  Total Knee Arthroplasty  Surgeon- Gus Rankin. Angeliz Settlemyre, MD  Assistant- Arther Abbott, PA-C   Anesthesia-   Adductor canal block and spinal  EBL- 25 ml   Drains None  Tourniquet time-  Total Tourniquet Time Documented: Thigh (Left) - 34 minutes Total: Thigh (Left) - 34 minutes     Complications- None  Condition-PACU - hemodynamically stable.   Brief Clinical Note  Kimberly Walsh is a 77 y.o. year old female with end stage OA of her left knee with progressively worsening pain and dysfunction. She has constant pain, with activity and at rest and significant functional deficits with difficulties even with ADLs. She has had extensive non-op management including analgesics, injections of cortisone and viscosupplements, and home exercise program, but remains in significant pain with significant dysfunction. Radiographs show bone on bone arthritis medial and patellofemoral. She presents now for left Total Knee Arthroplasty.     Procedure in detail---   The patient is brought into the operating room and positioned supine on the operating table. After successful administration of  Adductor canal block and spinal,   a tourniquet is placed high on the  Left thigh(s) and the lower extremity is prepped and draped in the usual sterile fashion. Time out is performed by the operating team and then the  Left lower extremity is wrapped in Esmarch, knee flexed and the tourniquet inflated to 300 mmHg.       A midline incision is made with a ten blade through the subcutaneous tissue to the level of the extensor mechanism. A fresh blade is used to make a medial parapatellar arthrotomy. Soft tissue over the proximal medial tibia is subperiosteally elevated to the joint line with a knife and into the semimembranosus bursa with a Cobb  elevator. Soft tissue over the proximal lateral tibia is elevated with attention being paid to avoiding the patellar tendon on the tibial tubercle. The patella is everted, knee flexed 90 degrees and the ACL and PCL are removed. Findings are bone on bone medial and patellofemoral with large global osteophytes        The drill is used to create a starting hole in the distal femur and the canal is thoroughly irrigated with sterile saline to remove the fatty contents. The 5 degree Left  valgus alignment guide is placed into the femoral canal and the distal femoral cutting block is pinned to remove 9 mm off the distal femur. Resection is made with an oscillating saw.      The tibia is subluxed forward and the menisci are removed. The extramedullary alignment guide is placed referencing proximally at the medial aspect of the tibial tubercle and distally along the second metatarsal axis and tibial crest. The block is pinned to remove 2mm off the more deficient medial  side. Resection is made with an oscillating saw. Size 4is the most appropriate size for the tibia and the proximal tibia is prepared with the modular drill and keel punch for that size.      The femoral sizing guide is placed and size 5 is most appropriate. Rotation is marked off the epicondylar axis and confirmed by creating a rectangular flexion gap at 90 degrees. The size 5 cutting block is pinned in this rotation and the anterior, posterior and chamfer cuts are made with the oscillating saw. The intercondylar block is then placed and that cut  is made.      Trial size 4 tibial component, trial size 5 posterior stabilized femur and a 7  mm posterior stabilized rotating platform insert trial is placed. Full extension is achieved with excellent varus/valgus and anterior/posterior balance throughout full range of motion. The patella is everted and thickness measured to be 22  mm. Free hand resection is taken to 12 mm, a 38 template is placed, lug holes are  drilled, trial patella is placed, and it tracks normally. Osteophytes are removed off the posterior femur with the trial in place. All trials are removed and the cut bone surfaces prepared with pulsatile lavage. Cement is mixed and once ready for implantation, the size 4 tibial implant, size  5 posterior stabilized femoral component, and the size 38 patella are cemented in place and the patella is held with the clamp. The trial insert is placed and the knee held in full extension. The Exparel (20 ml mixed with 60 ml saline) is injected into the extensor mechanism, posterior capsule, medial and lateral gutters and subcutaneous tissues.  All extruded cement is removed and once the cement is hard the permanent 7 mm posterior stabilized rotating platform insert is placed into the tibial tray.      The wound is copiously irrigated with saline solution and the extensor mechanism closed with # 0 Stratofix suture. The tourniquet is released for a total tourniquet time of 34  minutes. Flexion against gravity is 140 degrees and the patella tracks normally. Subcutaneous tissue is closed with 2.0 vicryl and subcuticular with running 4.0 Monocryl. The incision is cleaned and dried and steri-strips and a bulky sterile dressing are applied. The limb is placed into a knee immobilizer and the patient is awakened and transported to recovery in stable condition.      Please note that a surgical assistant was a medical necessity for this procedure in order to perform it in a safe and expeditious manner. Surgical assistant was necessary to retract the ligaments and vital neurovascular structures to prevent injury to them and also necessary for proper positioning of the limb to allow for anatomic placement of the prosthesis.   Gus Rankin [name], MD    03/29/2023, 8:24 AM

## 2023-03-29 NOTE — Care Plan (Signed)
Ortho Bundle Case Management Note  Patient Details  Name: HENRIETTA CIESLEWICZ MRN: [id number] Date of Birth: [birth year]                  L TKA on 04/12/23.  DCP: Home with granddaughter Fleet Contras.  DME: No needs. Has RW.  PT: [facility] Referral faxed.   DME Arranged:  N/A DME Agency:      Additional Comments: Please contact me with any questions of if this plan should need to change.    Despina Pole, CCM Case Manager, [facility]ec[facility]ute  [phone] 03/29/2023, 9:27 AM

## 2023-03-29 NOTE — Transfer of Care (Signed)
Immediate Anesthesia Transfer of Care Note  Patient: Kimberly Walsh  Procedure(s) Performed: Procedure(s): LEFT TOTAL KNEE ARTHROPLASTY (Left)  Patient Location: PACU  Anesthesia Type:Spinal  Level of Consciousness:  sedated, patient cooperative and responds to stimulation  Airway & Oxygen Therapy:Patient Spontanous Breathing and Patient connected to face mask oxgen  Post-op Assessment:  Report given to PACU RN and Post -op Vital signs reviewed and stable  Post vital signs:  Reviewed and stable  Last Vitals:  Vitals:   03/29/23 0609  BP: (!) 152/66  Pulse: 84  Resp: 20  Temp: 36.8 C  SpO2: 98%    Complications: No apparent anesthesia complications

## 2023-03-29 NOTE — Anesthesia Procedure Notes (Signed)
Spinal  Patient location during procedure: OR Start time: 03/29/2023 7:12 AM End time: 03/29/2023 7:15 AM Reason for block: surgical anesthesia Staffing Performed: anesthesiologist  Anesthesiologist: Mal Amabile, MD Performed by: Mal Amabile, MD Authorized by: Mal Amabile, MD   Preanesthetic Checklist Completed: patient identified, IV checked, site marked, risks and benefits discussed, surgical consent, monitors and equipment checked, pre-op evaluation and timeout performed Spinal Block Patient position: sitting Prep: DuraPrep and site prepped and draped Patient monitoring: heart rate, cardiac monitor, continuous pulse ox and blood pressure Approach: midline Location: L3-4 Injection technique: single-shot Needle Needle type: Pencan  Needle gauge: 24 G Needle length: 9 cm Needle insertion depth: 7 cm Assessment Sensory level: T6 Events: CSF return Additional Notes Patient tolerated procedure well. Adequate sensory level.

## 2023-03-29 NOTE — Progress Notes (Signed)
Orthopedic Tech Progress Note Patient Details:  Kimberly Walsh 1945/09/12 [id number]  CPM Left Knee CPM Left Knee: Off Left Knee Flexion (Degrees): 40 Left Knee Extension (Degrees): 10  Post Interventions Patient Tolerated: Well  Darleen Crocker 03/29/2023, 1:31 PM

## 2023-03-29 NOTE — Interval H&P Note (Signed)
History and Physical Interval Note:  03/29/2023 6:33 AM  Kimberly Walsh  has presented today for surgery, with the diagnosis of left knee osteoarthritis.  The various methods of treatment have been discussed with the patient and family. After consideration of risks, benefits and other options for treatment, the patient has consented to  Procedure(s): TOTAL KNEE ARTHROPLASTY (Left) as a surgical intervention.  The patient's history has been reviewed, patient examined, no change in status, stable for surgery.  I have reviewed the patient's chart and labs.  Questions were answered to the patient's satisfaction.     Homero Fellers Brandt Chaney

## 2023-03-29 NOTE — Progress Notes (Signed)
PT Cancellation Note  Patient Details Name: Kimberly Walsh MRN: [id number] DOB: Apr 21, 1946   Cancelled Treatment:    Reason Eval/Treat Not Completed: Medical issues which prohibited therapy (pt is feeling nauseous, RN aware. Will follow.)   Tamala Ser PT 03/29/2023  Acute Rehabilitation Services  Office [phone]

## 2023-03-29 NOTE — Progress Notes (Signed)
Orthopedic Tech Progress Note Patient Details:  Kimberly Walsh [birth year] [id number]  CPM Left Knee CPM Left Knee: On Left Knee Flexion (Degrees): 40 Left Knee Extension (Degrees): 10  Post Interventions Patient Tolerated: Well  Darleen Crocker 03/29/2023, 9:31 AM

## 2023-03-29 NOTE — Discharge Instructions (Signed)
Ollen Gross, MD Total Joint Specialist [facility] [place] [address] [place], [place] [address] [phone]  TOTAL KNEE REPLACEMENT POSTOPERATIVE DIRECTIONS    Knee Rehabilitation, Guidelines Following Surgery  Results after knee surgery are often greatly improved when you follow the exercise, range of motion and muscle strengthening exercises prescribed by your doctor. Safety measures are also important to protect the knee from further injury. If any of these exercises cause you to have increased pain or swelling in your knee joint, decrease the amount until you are comfortable again and slowly increase them. If you have problems or questions, call your caregiver or physical therapist for advice.   BLOOD CLOT PREVENTION Resume your regular dose of Plavix upon discharge from the hospital. Take 325 mg Aspirin once daily for three weeks following surgery. Then take an 81 mg Aspirin once a day for three weeks. Then discontinue Aspirin. You may resume your vitamins/supplements upon discharge from the hospital. Do not take any NSAIDs (Advil, Aleve, Ibuprofen, Meloxicam, etc.).  HOME CARE INSTRUCTIONS  Remove items at home which could result in a fall. This includes throw rugs or furniture in walking pathways.  ICE to the affected knee as much as tolerated. Icing helps control swelling. If the swelling is well controlled you will be more comfortable and rehab easier. Continue to use ice on the knee for pain and swelling from surgery. You may notice swelling that will progress down to the foot and ankle. This is normal after surgery. Elevate the leg when you are not up walking on it.    Continue to use the breathing machine which will help keep your temperature down. It is common for your temperature to cycle up and down following surgery, especially at night when you are not up moving around and exerting yourself. The breathing machine keeps your lungs expanded and  your temperature down. Do not place pillow under the operative knee, focus on keeping the knee straight while resting  DIET You may resume your previous home diet once you are discharged from the hospital.  DRESSING / WOUND CARE / SHOWERING Keep your bulky bandage on for 2 days. On the third post-operative day you may remove the Ace bandage and gauze. There is a waterproof adhesive bandage on your skin which will stay in place until your first follow-up appointment. Once you remove this you will not need to place another bandage You may begin showering 3 days following surgery, but do not submerge the incision under water.  ACTIVITY For the first 5 days, the key is rest and control of pain and swelling Do your home exercises twice a day starting on post-operative day 3. On the days you go to physical therapy, just do the home exercises once that day. You should rest, ice and elevate the leg for 50 minutes out of every hour. Get up and walk/stretch for 10 minutes per hour. After 5 days you can increase your activity slowly as tolerated. Walk with your walker as instructed. Use the walker until you are comfortable transitioning to a cane. Walk with the cane in the opposite hand of the operative leg. You may discontinue the cane once you are comfortable and walking steadily. Avoid periods of inactivity such as sitting longer than an hour when not asleep. This helps prevent blood clots.  You may discontinue the knee immobilizer once you are able to perform a straight leg raise while lying down. You may resume a sexual relationship in one month  or when given the OK by your doctor.  You may return to work once you are cleared by your doctor.  Do not drive a car for 6 weeks or until released by your surgeon.  Do not drive while taking narcotics.  TED HOSE STOCKINGS Wear the elastic stockings on both legs for three weeks following surgery during the day. You may remove them at night for  sleeping.  WEIGHT BEARING Weight bearing as tolerated with assist device (walker, cane, etc) as directed, use it as long as suggested by your surgeon or therapist, typically at least 4-6 weeks.  POSTOPERATIVE CONSTIPATION PROTOCOL Constipation - defined medically as fewer than three stools per week and severe constipation as less than one stool per week.  One of the most common issues patients have following surgery is constipation.  Even if you have a regular bowel pattern at home, your normal regimen is likely to be disrupted due to multiple reasons following surgery.  Combination of anesthesia, postoperative narcotics, change in appetite and fluid intake all can affect your bowels.  In order to avoid complications following surgery, here are some recommendations in order to help you during your recovery period.  Colace (docusate) - Pick up an over-the-counter form of Colace or another stool softener and take twice a day as long as you are requiring postoperative pain medications.  Take with a full glass of water daily.  If you experience loose stools or diarrhea, hold the colace until you stool forms back up. If your symptoms do not get better within 1 week or if they get worse, check with your doctor. Dulcolax (bisacodyl) - Pick up over-the-counter and take as directed by the product packaging as needed to assist with the movement of your bowels.  Take with a full glass of water.  Use this product as needed if not relieved by Colace only.  MiraLax (polyethylene glycol) - Pick up over-the-counter to have on hand. MiraLax is a solution that will increase the amount of water in your bowels to assist with bowel movements.  Take as directed and can mix with a glass of water, juice, soda, coffee, or tea. Take if you go more than two days without a movement. Do not use MiraLax more than once per day. Call your doctor if you are still constipated or irregular after using this medication for 7 days in a  row.  If you continue to have problems with postoperative constipation, please contact the office for further assistance and recommendations.  If you experience "the worst abdominal pain ever" or develop nausea or vomiting, please contact the office immediatly for further recommendations for treatment.  ITCHING If you experience itching with your medications, try taking only a single pain pill, or even half a pain pill at a time.  You can also use Benadryl over the counter for itching or also to help with sleep.   MEDICATIONS See your medication summary on the "After Visit Summary" that the nursing staff will review with you prior to discharge.  You may have some home medications which will be placed on hold until you complete the course of blood thinner medication.  It is important for you to complete the blood thinner medication as prescribed by your surgeon.  Continue your approved medications as instructed at time of discharge.  PRECAUTIONS If you experience chest pain or shortness of breath - call 911 immediately for transfer to the hospital emergency department.  If you develop a fever greater that 101 F,  purulent drainage from wound, increased redness or drainage from wound, foul odor from the wound/dressing, or calf pain - CONTACT YOUR SURGEON.                                                   FOLLOW-UP APPOINTMENTS Make sure you keep all of your appointments after your operation with your surgeon and caregivers. You should call the office at the above phone number and make an appointment for approximately two weeks after the date of your surgery or on the date instructed by your surgeon outlined in the "After Visit Summary".  RANGE OF MOTION AND STRENGTHENING EXERCISES  Rehabilitation of the knee is important following a knee injury or an operation. After just a few days of immobilization, the muscles of the thigh which control the knee become weakened and shrink (atrophy). Knee exercises  are designed to build up the tone and strength of the thigh muscles and to improve knee motion. Often times heat used for twenty to thirty minutes before working out will loosen up your tissues and help with improving the range of motion but do not use heat for the first two weeks following surgery. These exercises can be done on a training (exercise) mat, on the floor, on a table or on a bed. Use what ever works the best and is most comfortable for you Knee exercises include:  Leg Lifts - While your knee is still immobilized in a splint or cast, you can do straight leg raises. Lift the leg to 60 degrees, hold for 3 sec, and slowly lower the leg. Repeat 10-20 times 2-3 times daily. Perform this exercise against resistance later as your knee gets better.  Quad and Hamstring Sets - Tighten up the muscle on the front of the thigh (Quad) and hold for 5-10 sec. Repeat this 10-20 times hourly. Hamstring sets are done by pushing the foot backward against an object and holding for 5-10 sec. Repeat as with quad sets.  Leg Slides: Lying on your back, slowly slide your foot toward your buttocks, bending your knee up off the floor (only go as far as is comfortable). Then slowly slide your foot back down until your leg is flat on the floor again. Angel Wings: Lying on your back spread your legs to the side as far apart as you can without causing discomfort.  A rehabilitation program following serious knee injuries can speed recovery and prevent re-injury in the future due to weakened muscles. Contact your doctor or a physical therapist for more information on knee rehabilitation.   POST-OPERATIVE OPIOID TAPER INSTRUCTIONS: It is important to wean off of your opioid medication as soon as possible. If you do not need pain medication after your surgery it is ok to stop day one. Opioids include: Codeine, Hydrocodone(Norco, Vicodin), Oxycodone(Percocet, oxycontin) and hydromorphone amongst others.  Long term and even short  term use of opiods can cause: Increased pain response Dependence Constipation Depression Respiratory depression And more.  Withdrawal symptoms can include Flu like symptoms Nausea, vomiting And more Techniques to manage these symptoms Hydrate well Eat regular healthy meals Stay active Use relaxation techniques(deep breathing, meditating, yoga) Do Not substitute Alcohol to help with tapering If you have been on opioids for less than two weeks and do not have pain than it is ok to stop all together.  Plan  to wean off of opioids This plan should start within one week post op of your joint replacement. Maintain the same interval or time between taking each dose and first decrease the dose.  Cut the total daily intake of opioids by one tablet each day Next start to increase the time between doses. The last dose that should be eliminated is the evening dose.   IF YOU ARE TRANSFERRED TO A SKILLED REHAB FACILITY If the patient is transferred to a skilled rehab facility following release from the hospital, a list of the current medications will be sent to the facility for the patient to continue.  When discharged from the skilled rehab facility, please have the facility set up the patient's Home Health Physical Therapy prior to being released. Also, the skilled facility will be responsible for providing the patient with their medications at time of release from the facility to include their pain medication, the muscle relaxants, and their blood thinner medication. If the patient is still at the rehab facility at time of the two week follow up appointment, the skilled rehab facility will also need to assist the patient in arranging follow up appointment in our office and any transportation needs.  MAKE SURE YOU:  Understand these instructions.  Get help right away if you are not doing well or get worse.   DENTAL ANTIBIOTICS:  In most cases prophylactic antibiotics for Dental procdeures after  total joint surgery are not necessary.  Exceptions are as follows:  1. History of prior total joint infection  2. Severely immunocompromised (Organ Transplant, cancer chemotherapy, Rheumatoid biologic meds such as Humera)  3. Poorly controlled diabetes (A1C &gt; 8.0, blood glucose over 200)  If you have one of these conditions, contact your surgeon for an antibiotic prescription, prior to your dental procedure.    Pick up stool softner and laxative for home use following surgery while on pain medications. Do not submerge incision under water. Please use good hand washing techniques while changing dressing each day. May shower starting three days after surgery. Please use a clean towel to pat the incision dry following showers. Continue to use ice for pain and swelling after surgery. Do not use any lotions or creams on the incision until instructed by your surgeon.

## 2023-03-29 NOTE — Anesthesia Procedure Notes (Signed)
Anesthesia Regional Block: Adductor canal block   Pre-Anesthetic Checklist: , timeout performed,  Correct Patient, Correct Site, Correct Laterality,  Correct Procedure, Correct Position, site marked,  Risks and benefits discussed,  Surgical consent,  Pre-op evaluation,  At surgeon's request and post-op pain management  Laterality: Left  Prep: chloraprep       Needles:  Injection technique: Single-shot  Needle Type: Echogenic Stimulator Needle     Needle Length: 10cm  Needle Gauge: 21   Needle insertion depth: 7 cm   Additional Needles:   Procedures:,,,, ultrasound used (permanent image in chart),,    Narrative:  Start time: 03/29/2023 6:49 AM End time: 03/29/2023 6:54 AM Injection made incrementally with aspirations every 5 mL.  Performed by: Personally  Anesthesiologist: Mal Amabile, MD  Additional Notes: Timeout performed. Patient sedated. Relevant anatomy ID'd using [place]. Incremental 2-71ml injection of LA with frequent aspiration. Patient tolerated procedure well.

## 2023-03-29 NOTE — Progress Notes (Signed)
Patient tolerating clear liquids, will advance diet to regular.

## 2023-03-29 NOTE — Anesthesia Postprocedure Evaluation (Signed)
Anesthesia Post Note  Patient: Kimberly Walsh  Procedure(s) Performed: LEFT TOTAL KNEE ARTHROPLASTY (Left: Knee)     Patient location during evaluation: PACU Anesthesia Type: Spinal Level of consciousness: oriented and awake and alert Pain management: pain level controlled Vital Signs Assessment: post-procedure vital signs reviewed and stable Respiratory status: spontaneous breathing, respiratory function stable and nonlabored ventilation Cardiovascular status: blood pressure returned to baseline and stable Postop Assessment: no headache, no backache, no apparent nausea or vomiting, spinal receding and patient able to bend at knees Anesthetic complications: no   No notable events documented.  Last Vitals:  Vitals:   03/29/23 0918 03/29/23 0930  BP:  (!) 133/59  Pulse: 82 79  Resp: (!) 21 14  Temp:    SpO2: 94% 94%    Last Pain:  Vitals:   03/29/23 0930  TempSrc:   PainSc: 0-No pain            L Sensory Level: S1-Sole of foot, small toes (03/29/23 0930) R Sensory Level: S1-Sole of foot, small toes (03/29/23 0930)  Tregan Read A.

## 2023-03-30 ENCOUNTER — Encounter (HOSPITAL_COMMUNITY): Payer: Self-pay | Admitting: Orthopedic Surgery

## 2023-03-30 ENCOUNTER — Encounter (HOSPITAL_COMMUNITY): Payer: Medicare Other

## 2023-03-30 ENCOUNTER — Other Ambulatory Visit (HOSPITAL_COMMUNITY): Payer: Self-pay

## 2023-03-30 DIAGNOSIS — Z85828 Personal history of other malignant neoplasm of skin: Secondary | ICD-10-CM | POA: Diagnosis not present

## 2023-03-30 DIAGNOSIS — M1712 Unilateral primary osteoarthritis, left knee: Secondary | ICD-10-CM | POA: Diagnosis not present

## 2023-03-30 DIAGNOSIS — N183 Chronic kidney disease, stage 3 unspecified: Secondary | ICD-10-CM | POA: Diagnosis not present

## 2023-03-30 DIAGNOSIS — I129 Hypertensive chronic kidney disease with stage 1 through stage 4 chronic kidney disease, or unspecified chronic kidney disease: Secondary | ICD-10-CM | POA: Diagnosis not present

## 2023-03-30 DIAGNOSIS — Z8673 Personal history of transient ischemic attack (TIA), and cerebral infarction without residual deficits: Secondary | ICD-10-CM | POA: Diagnosis not present

## 2023-03-30 DIAGNOSIS — E039 Hypothyroidism, unspecified: Secondary | ICD-10-CM | POA: Diagnosis not present

## 2023-03-30 LAB — BASIC METABOLIC PANEL WITH GFR
Anion gap: 9 (ref 5–15)
BUN: 17 mg/dL (ref 8–23)
CO2: 23 mmol/L (ref 22–32)
Calcium: 8.6 mg/dL — ABNORMAL LOW (ref 8.9–10.3)
Chloride: 105 mmol/L (ref 98–111)
Creatinine, Ser: 1.04 mg/dL — ABNORMAL HIGH (ref 0.44–1.00)
GFR, Estimated: 56 mL/min — ABNORMAL LOW
Glucose, Bld: 147 mg/dL — ABNORMAL HIGH (ref 70–99)
Potassium: 5.2 mmol/L — ABNORMAL HIGH (ref 3.5–5.1)
Sodium: 137 mmol/L (ref 135–145)

## 2023-03-30 LAB — CBC
HCT: 37.7 % (ref 36.0–46.0)
Hemoglobin: 11.4 g/dL — ABNORMAL LOW (ref 12.0–15.0)
MCH: 28.9 pg (ref 26.0–34.0)
MCHC: 30.2 g/dL (ref 30.0–36.0)
MCV: 95.7 fL (ref 80.0–100.0)
Platelets: 145 10*3/uL — ABNORMAL LOW (ref 150–400)
RBC: 3.94 MIL/uL (ref 3.87–5.11)
RDW: 13.9 % (ref 11.5–15.5)
WBC: 10.3 10*3/uL (ref 4.0–10.5)
nRBC: 0 % (ref 0.0–0.2)

## 2023-03-30 MED ORDER — OXYCODONE HCL 5 MG PO TABS
5.0000 mg | ORAL_TABLET | Freq: Four times a day (QID) | ORAL | 0 refills | Status: DC | PRN
  Filled 2023-03-30: qty 42, 6d supply, fill #0

## 2023-03-30 MED ORDER — METHOCARBAMOL 500 MG PO TABS
500.0000 mg | ORAL_TABLET | Freq: Four times a day (QID) | ORAL | 0 refills | Status: AC | PRN
  Filled 2023-03-30: qty 40, 10d supply, fill #0

## 2023-03-30 MED ORDER — ASPIRIN 325 MG PO TBEC
325.0000 mg | DELAYED_RELEASE_TABLET | Freq: Every day | ORAL | 0 refills | Status: AC
  Filled 2023-03-30: qty 20, 20d supply, fill #0

## 2023-03-30 MED ORDER — TRAMADOL HCL 50 MG PO TABS
50.0000 mg | ORAL_TABLET | Freq: Four times a day (QID) | ORAL | 0 refills | Status: DC | PRN
  Filled 2023-03-30: qty 40, 5d supply, fill #0

## 2023-03-30 MED ORDER — SODIUM CHLORIDE 0.9% FLUSH: 10.0000 mL | Freq: Two times a day (BID) | INTRAVENOUS | Status: DC

## 2023-03-30 NOTE — Care Management Obs Status (Signed)
MEDICARE OBSERVATION STATUS NOTIFICATION   Patient Details  Name: Kimberly Walsh MRN: [id number] Date of Birth: 11-Dec-1945   Medicare Observation Status Notification Given:  Hart Robinsons, LCSW 03/30/2023, 11:35 AM

## 2023-03-30 NOTE — Plan of Care (Signed)

## 2023-03-30 NOTE — Evaluation (Signed)
Physical Therapy Evaluation Patient Details Name: Kimberly Walsh MRN: [id number] DOB: [birth year] Today's Date: 03/30/2023  History of Present Illness  Pt is a 77 y/o F admitted on 03/29/23 for scheduled L TKA. PMH: HLD, HTN, hypothyroidism, stroke, vertigo, CKD3, R TKA  Clinical Impression  Pt seen for PT evaluation with pt agreeable. Pt reports prior to admission she was independent without AD, living alone, & driving. On this date, PT provides pt with HEP handout & pt performs LLE LAQ x 10 with cuing for technique. Pt is able to progress to walking with RW & CGA fade to supervision. Pt negotiates 3 steps with CGA<>supervision as well. Pt is doing very well & denies concerns re: d/c home.         If plan is discharge home, recommend the following: A little help with walking and/or transfers;A little help with bathing/dressing/bathroom;Assistance with cooking/housework;Assist for transportation;Help with stairs or ramp for entrance   Can travel by private vehicle        Equipment Recommendations None recommended by PT (pt reports she has a RW)  Recommendations for Other Services       Functional Status Assessment Patient has had a recent decline in their functional status and demonstrates the ability to make significant improvements in function in a reasonable and predictable amount of time.     Precautions / Restrictions Precautions Precautions: Fall Restrictions Weight Bearing Restrictions: Yes LLE Weight Bearing: Weight bearing as tolerated      Mobility  Bed Mobility               General bed mobility comments: not tested, pt received & left sitting in recliner    Transfers Overall transfer level: Needs assistance Equipment used: Rolling walker (2 wheels) Transfers: Sit to/from Stand Sit to Stand: Supervision           General transfer comment: STS with cuing re: hand placement    Ambulation/Gait Ambulation/Gait assistance: Contact guard assist,  Supervision Gait Distance (Feet): 80 Feet (+ 125 + 225) Assistive device: Rolling walker (2 wheels) Gait Pattern/deviations: Decreased stride length, Decreased dorsiflexion - right, Decreased dorsiflexion - left, Decreased step length - left, Decreased step length - right Gait velocity: decreased     General Gait Details: decreased L hip/knee flexion during swing phase  Stairs Stairs: Yes Stairs assistance: Contact guard assist, Supervision Stair Management: Two rails, Step to pattern Number of Stairs: 3 (6") General stair comments: Pt negotiates stairs with B rails to simulate hand holds pt has at home. Pt able to demonstrate compensatory pattern with min cuing.  Wheelchair Mobility     Tilt Bed    Modified Rankin (Stroke Patients Only)       Balance Overall balance assessment: Needs assistance Sitting-balance support: No upper extremity supported, Feet supported Sitting balance-Leahy Scale: Good     Standing balance support: Bilateral upper extremity supported, During functional activity, Reliant on assistive device for balance Standing balance-Leahy Scale: Fair                               Pertinent Vitals/Pain Pain Assessment Pain Assessment: 0-10 Pain Score: 5  Pain Location: L knee Pain Descriptors / Indicators: Discomfort, Grimacing, Guarding Pain Intervention(s): Monitored during session    Home Living Family/patient expects to be discharged to:: Private residence Living Arrangements: Alone Available Help at Discharge: Family;Available 24 hours/day (granddaughter (Kimberly Walsh) will stay with pt a d/c) Type of Home: House Home  Access: Stairs to enter Entrance Stairs-Rails:  (no formal rails but pt has something to hold to on B sides) Entrance Stairs-Number of Steps: 2   Home Layout: Multi-level;Laundry or work area in [employer] Equipment: Agricultural consultant (2 wheels)      Prior Function               Mobility Comments: Pt reports she  was independent without AD, denies falls, driving ADLs Comments: Independent with cooking, cleaning, bathing, dressing.     Extremity/Trunk Assessment   Upper Extremity Assessment Upper Extremity Assessment: Overall WFL for tasks assessed    Lower Extremity Assessment Lower Extremity Assessment: LLE deficits/detail LLE Deficits / Details: 2/5 knee extension in sitting    Cervical / Trunk Assessment Cervical / Trunk Assessment: Normal  Communication      Cognition Arousal: Alert Behavior During Therapy: WFL for tasks assessed/performed Overall Cognitive Status: Within Functional Limits for tasks assessed                                 General Comments: verbose but pleasant & motivated to participate.        General Comments General comments (skin integrity, edema, etc.): Pt c/o mild lightheadedness, BP sitting after gait in LUE: 155/70 mmHg MAP 94, HR 66 bpm - nurse made aware    Exercises Total Joint Exercises Long Arc Quad: AROM, Seated, Strengthening, Left, 10 reps   Assessment/Plan    PT Assessment Patient needs continued PT services  PT Problem List Decreased strength;Pain;Decreased range of motion;Decreased activity tolerance;Decreased balance;Decreased mobility;Decreased knowledge of use of DME       PT Treatment Interventions DME instruction;Balance training;Modalities;Gait training;Neuromuscular re-education;Stair training;Functional mobility training;Patient/family education;Therapeutic activities;Therapeutic exercise;Manual techniques    PT Goals (Current goals can be found in the Care Plan section)  Acute Rehab PT Goals Patient Stated Goal: go home PT Goal Formulation: With patient Time For Goal Achievement: 04/13/23 Potential to Achieve Goals: Good    Frequency 7X/week     Co-evaluation               AM-PAC PT "6 Clicks" Mobility  Outcome Measure Help needed turning from your back to your side while in a flat bed without  using bedrails?: None Help needed moving from lying on your back to sitting on the side of a flat bed without using bedrails?: A Little Help needed moving to and from a bed to a chair (including a wheelchair)?: A Little Help needed standing up from a chair using your arms (e.g., wheelchair or bedside chair)?: A Little Help needed to walk in hospital room?: A Little Help needed climbing 3-5 steps with a railing? : A Little 6 Click Score: 19    End of Session Equipment Utilized During Treatment: Gait belt Activity Tolerance: Patient tolerated treatment well Patient left: in chair;with call bell/phone within reach Nurse Communication: Mobility status PT Visit Diagnosis: Pain;Muscle weakness (generalized) (M62.81);Difficulty in walking, not elsewhere classified (R26.2);Other abnormalities of gait and mobility (R26.89) Pain - Right/Left: Left Pain - part of body: Knee    Time: 2130-8657 PT Time Calculation (min) (ACUTE ONLY): 23 min   Charges:   PT Evaluation $PT Eval Low Complexity: 1 Low   PT General Charges $$ ACUTE PT VISIT: 1 Visit         Aleda Grana, PT, DPT 03/30/23, 12:32 PM   Sandi Mariscal 03/30/2023, 12:31 PM

## 2023-03-30 NOTE — TOC Transition Note (Signed)
Transition of Care [facility]) - CM/SW Discharge Note   Patient Details  Name: Kimberly Walsh MRN: [id number] Date of Birth: 1946/03/24  Transition of Care [facility]) CM/SW Contact:  Amada Jupiter, LCSW Phone Number: 03/30/2023, 11:29 AM   Clinical Narrative:     Met with pt who confirms she has needed DME in the home.  OPPT already arranged with [facility]).  No further TOC needs.  Final next level of care: OP Rehab Barriers to Discharge: No Barriers Identified   Patient Goals and CMS Choice      Discharge Placement                         Discharge Plan and Services Additional resources added to the After Visit Summary for                  DME Arranged: N/A                    Social Determinants of Health (SDOH) Interventions SDOH Screenings   Food Insecurity: No Food Insecurity (03/29/2023)  Housing: Low Risk  (03/29/2023)  Transportation Needs: No Transportation Needs (03/29/2023)  Utilities: Not At Risk (03/29/2023)  Alcohol Screen: Low Risk  (06/24/2022)  Depression (PHQ2-9): Low Risk  (11/02/2022)  Financial Resource Strain: Low Risk  (06/24/2022)  Physical Activity: Insufficiently Active (06/24/2022)  Social Connections: Moderately Integrated (06/24/2022)  Stress: No Stress Concern Present (06/24/2022)  Tobacco Use: Low Risk  (03/29/2023)     Readmission Risk Interventions     No data to display

## 2023-03-30 NOTE — Progress Notes (Signed)
Subjective: 1 Day Post-Op Procedure(s) (LRB): LEFT TOTAL KNEE ARTHROPLASTY (Left) Patient reports pain as mild.   Patient seen in rounds by Dr. Lequita Halt. Patient had issues with nausea yesterday, but reports she is feeling much better this morning. Denies chest pain, SOB, or calf pain. Foley catheter removed this AM.  We will continue therapy today  Objective: Vital signs in last 24 hours: Temp:  [97.5 F (36.4 C)-98.6 F (37 C)] 97.5 F (36.4 C) (10/08 0619) Pulse Rate:  [51-89] 64 (10/08 0619) Resp:  [11-25] 17 (10/08 0619) BP: (102-135)/(44-85) 110/54 (10/08 0619) SpO2:  [93 %-100 %] 100 % (10/08 0619) Weight:  [95.3 kg] 95.3 kg (10/07 1035)  Intake/Output from previous day:  Intake/Output Summary (Last 24 hours) at 03/30/2023 0829 Last data filed at 03/30/2023 0655 Gross per 24 hour  Intake 2074.8 ml  Output 2250 ml  Net -175.2 ml     Intake/Output this shift: No intake/output data recorded.  Labs: Recent Labs    03/30/23 0345  HGB 11.4*   Recent Labs    03/30/23 0345  WBC 10.3  RBC 3.94  HCT 37.7  PLT 145*   Recent Labs    03/29/23 0629 03/30/23 0345  NA 138 137  K 3.7 5.2*  CL 106 105  CO2 23 23  BUN 19 17  CREATININE 1.25* 1.04*  GLUCOSE 88 147*  CALCIUM 8.7* 8.6*   No results for input(s): "LABPT", "INR" in the last 72 hours.  Exam: General - Patient is Alert and Oriented Extremity - Neurologically intact Neurovascular intact Sensation intact distally Dorsiflexion/Plantar flexion intact Dressing - dressing C/D/I Motor Function - intact, moving foot and toes well on exam.   Past Medical History:  Diagnosis Date   Arthritis    Cancer (HCC) 2016   skin cancer   Hyperlipidemia    Hypertension    Hypothyroidism    PONV (postoperative nausea and vomiting)    Stroke (HCC)    per pt. stroke was ruled out still on plavix   Venous stasis    Vertigo     Assessment/Plan: 1 Day Post-Op Procedure(s) (LRB): LEFT TOTAL KNEE ARTHROPLASTY  (Left) Principal Problem:   OA (osteoarthritis) of knee Active Problems:   Osteoarthritis of left knee  Estimated body mass index is 33.89 kg/m as calculated from the following:   Height as of this encounter: 5\' 6"  (1.676 m).   Weight as of this encounter: 95.3 kg. Advance diet Up with therapy D/C IV fluids   Patient's anticipated LOS is less than 2 midnights, meeting these requirements: - Lives within 1 hour of care - Has a competent adult at home to recover with post-op recover - NO history of  - Chronic pain requiring opiods  - Diabetes  - Heart failure  - Stroke  - DVT/VTE  - Cardiac arrhythmia  - Respiratory Failure/COPD  - Renal failure  - Anemia  - Advanced Liver disease  DVT Prophylaxis - Aspirin and Plavix Weight bearing as tolerated. Continue therapy.  BP soft this AM, will hold on bolus given national shortage. Low threshold to order if patient has dizziness with therapy this morning.  Plan is to go Home after hospital stay. Plan for discharge later today if progresses with therapy and meeting goals. Scheduled for OPPT at [facility]). Follow-up in the office in 2 weeks.  The PDMP database was reviewed today prior to any opioid medications being prescribed to this patient.  Arther Abbott, PA-C Orthopedic Surgery [phone] 03/30/2023, 8:29  AM

## 2023-03-30 NOTE — Plan of Care (Signed)
  Problem: Education: Goal: Knowledge of the prescribed therapeutic regimen will improve Outcome: Progressing   Problem: Activity: Goal: Range of joint motion will improve Outcome: Progressing   Problem: Clinical Measurements: Goal: Postoperative complications will be avoided or minimized Outcome: Progressing   Problem: Pain Management: Goal: Pain level will decrease with appropriate interventions Outcome: Progressing   Problem: Coping: Goal: Level of anxiety will decrease Outcome: Progressing   Problem: Safety: Goal: Ability to remain free from injury will improve Outcome: Progressing

## 2023-04-02 ENCOUNTER — Ambulatory Visit: Payer: Medicare Other | Attending: Orthopedic Surgery | Admitting: Physical Therapy

## 2023-04-02 ENCOUNTER — Other Ambulatory Visit: Payer: Self-pay

## 2023-04-02 ENCOUNTER — Encounter: Payer: Self-pay | Admitting: Physical Therapy

## 2023-04-02 DIAGNOSIS — R600 Localized edema: Secondary | ICD-10-CM

## 2023-04-02 DIAGNOSIS — G8929 Other chronic pain: Secondary | ICD-10-CM

## 2023-04-02 DIAGNOSIS — M25662 Stiffness of left knee, not elsewhere classified: Secondary | ICD-10-CM

## 2023-04-02 DIAGNOSIS — M6281 Muscle weakness (generalized): Secondary | ICD-10-CM

## 2023-04-02 DIAGNOSIS — M25562 Pain in left knee: Secondary | ICD-10-CM | POA: Insufficient documentation

## 2023-04-02 NOTE — Therapy (Signed)
OUTPATIENT PHYSICAL THERAPY LOWER EXTREMITY EVALUATION   Patient Name: Kimberly Walsh MRN: [id number] DOB:1945/07/15, 77 y.o., female Today's Date: 04/02/2023  END OF SESSION:  PT End of Session - 04/02/23 1343     Visit Number 1    Number of Visits 12    Date for PT Re-Evaluation 07/01/23    Authorization Type FOTO.    PT Start Time 1100    PT Stop Time 1136    PT Time Calculation (min) 36 min    Activity Tolerance Patient tolerated treatment well    Behavior During Therapy WFL for tasks assessed/performed             Past Medical History:  Diagnosis Date   Arthritis    Cancer (HCC) 2016   skin cancer   Hyperlipidemia    Hypertension    Hypothyroidism    PONV (postoperative nausea and vomiting)    Stroke (HCC)    per pt. stroke was ruled out still on plavix   Venous stasis    Vertigo    Past Surgical History:  Procedure Laterality Date   basal carcinoma rt leg     CHOLECYSTECTOMY     FINGER SURGERY Left    Ring finger   MIDDLE EAR SURGERY     Fungus   TOTAL KNEE ARTHROPLASTY Right 12/14/2022   Procedure: TOTAL KNEE ARTHROPLASTY;  Surgeon: Ollen Gross, MD;  Location: [facility];  Service: Orthopedics;  Laterality: Right;   TOTAL KNEE ARTHROPLASTY Left 03/29/2023   Procedure: LEFT TOTAL KNEE ARTHROPLASTY;  Surgeon: Ollen Gross, MD;  Location: [facility];  Service: Orthopedics;  Laterality: Left;   TUBAL LIGATION     VEIN SURGERY     Patient Active Problem List   Diagnosis Date Noted   Osteoarthritis of left knee 03/29/2023   Primary osteoarthritis of left knee 12/14/2022   Primary osteoarthritis of right knee 12/14/2022   Completed stroke (HCC) 09/22/2019   PVC (premature ventricular contraction) 09/22/2019   Late effect of cerebrovascular accident (CVA) 04/20/2019   CKD (chronic kidney disease) stage 3, GFR 30-59 ml/min (HCC) 03/07/2018   Peripheral edema 09/15/2017   BMI 37.0-37.9, adult 09/28/2016   Mixed hyperlipidemia 05/05/2016   Essential  hypertension, benign 03/01/2014   Hypothyroidism 03/01/2014   OA (osteoarthritis) of knee 04/25/2010    REFERRING PROVIDER: Ollen Gross MD  REFERRING DIAG: Left total knee arthroplasty.  THERAPY DIAG:  Chronic pain of left knee  Stiffness of left knee, not elsewhere classified  Localized edema  Muscle weakness (generalized)  Rationale for Evaluation and Treatment: Rehabilitation  ONSET DATE: 03/29/23 (surgery).  SUBJECTIVE:   SUBJECTIVE STATEMENT: The patient presents to the clinic today s/p left total knee replacement performed on 03/29/23.  She experienced some PONV after surgery but is feeling better now, though she is is tired today.  She is walking safely with a FWW.  Her Aquacel is intact but she forgot to put her TED hose on.  She states she will have a family member put them on for her today.  Her pain is rated at an 8/10 today.    PERTINENT HISTORY: Right total knee replacement. PAIN:  Are you having pain? Yes: NPRS scale: 8/10 Pain location: Left knee Pain description: Ache, sore, throbbing, sharp, numb. Aggravating factors: Moving. Relieving factors: Ice and medication.  PRECAUTIONS: Other: No ultrasound.  WEIGHT BEARING RESTRICTIONS: No  FALLS:  Has patient fallen in last 6 months? No  LIVING ENVIRONMENT: Lives in: House/apartment Stairs: One step. Has following  equipment at home: Dan Humphreys - 2 wheeled  OCCUPATION: Retired.  PLOF: Independent with basic ADLs  PATIENT GOALS: decrease pain and do more.  OBJECTIVE:  Note: Objective measures were completed at Evaluation unless otherwise noted.  PATIENT SURVEYS:  FOTO 32.80  EDEMA:  Circumferential: Left 5 cms > right.  PALPATION: Diffuse anterior left knee pain.  LOWER EXTREMITY ROM:  In supine:  -20 degrees of extension and passive to -15 degrees (within 5 degrees of right) and seated flexion to 75 degrees. LOWER EXTREMITY MMT:  Patient currently able to perform an antigravity left SLR and  SAQ.  GAIT: Safe ambulation with a FWW with decreased step length.   TODAY'S TREATMENT:                                                                                                                              DATE: LE elevation and vasopneumatic x 15 minutes to patient's left knee.     PATIENT EDUCATION:  Education details: See below. Person educated: Patient Education method: Explanation Education comprehension: verbalized understanding  HOME EXERCISE PROGRAM: Patient to perform HEP from hospital discharge.  ASSESSMENT:  CLINICAL IMPRESSION: The patient presents to OPPT s/p left total knee replacement performed on 03/29/23.  She is walking safely with a FWW.  She is lacking knee extension and flexion at this time.  She is unable to perform an antigravity SLR and SAQ at this time.  She exhibits moderate edema currently.  Encouraged her to be compliant to her HEP and donn her TED hose when she gets home today.  Patient will benefit from skilled physical therapy intervention to address pain and deficits.  OBJECTIVE IMPAIRMENTS: Abnormal gait, decreased activity tolerance, decreased ROM, decreased strength, increased edema, and pain.   ACTIVITY LIMITATIONS: carrying, lifting, bending, stairs, and locomotion level  PARTICIPATION LIMITATIONS: meal prep, cleaning, and laundry  PERSONAL FACTORS: Time since onset of injury/illness/exacerbation are also affecting patient's functional outcome.   REHAB POTENTIAL: Excellent  CLINICAL DECISION MAKING: Stable/uncomplicated  EVALUATION COMPLEXITY: Low   GOALS:  SHORT TERM GOALS: Target date: 04/16/23  Ind with an initial HEP.   LONG TERM GOALS: Target date: 07/01/23.  Ind with an initial HEP.  Goal status: INITIAL  2.  Active left knee extension to -5 degrees.  Goal status: INITIAL  3.  Active left knee flexion to 115 degrees+ so the patient can perform functional tasks and do so with pain not > 2-3/10.  Goal status:  INITIAL  4.  Increase left hip and knee strength to a solid 4+/5 to provide good stability for accomplishment of functional activities. Goal status: INITIAL  5.  Perform a reciprocating stair gait with one railing with pain not > 2-3/10.  Goal status: INITIAL  6.  Perform ADL's with pain not > 3/10.  Goal status: INITIAL   PLAN:  PT FREQUENCY:  2-3 times a week  PT DURATION: 4 weeks  PLANNED INTERVENTIONS: 97110-Therapeutic exercises, 97530- Therapeutic  activity, [id number]- Neuromuscular re-education, 97535- Self Care, 64403- Manual therapy, [id number]- Gait training, 97014- Electrical stimulation (unattended), 97016- Vasopneumatic device, Patient/Family education, Cryotherapy, and Moist heat  PLAN FOR NEXT SESSION: Nustep.  Progress per TKA protocol.  LE elevation and vasopneumatic.   Anik Wesch, [place], PT 04/02/2023, 2:17 PM

## 2023-04-04 DIAGNOSIS — I10 Essential (primary) hypertension: Secondary | ICD-10-CM | POA: Diagnosis not present

## 2023-04-04 DIAGNOSIS — Z96653 Presence of artificial knee joint, bilateral: Secondary | ICD-10-CM | POA: Diagnosis not present

## 2023-04-04 DIAGNOSIS — K5900 Constipation, unspecified: Secondary | ICD-10-CM | POA: Diagnosis not present

## 2023-04-04 DIAGNOSIS — E039 Hypothyroidism, unspecified: Secondary | ICD-10-CM | POA: Diagnosis not present

## 2023-04-05 ENCOUNTER — Ambulatory Visit: Payer: Medicare Other

## 2023-04-05 NOTE — Discharge Summary (Signed)
Patient ID: Kimberly Walsh MRN: [id number] DOB/AGE: [birth year] 76 y.o.  Admit date: 03/29/2023 Discharge date: 03/30/2023  Admission Diagnoses:  Principal Problem:   OA (osteoarthritis) of knee Active Problems:   Osteoarthritis of left knee   Discharge Diagnoses:  Same  Past Medical History:  Diagnosis Date   Arthritis    Cancer (HCC) 2016   skin cancer   Hyperlipidemia    Hypertension    Hypothyroidism    PONV (postoperative nausea and vomiting)    Stroke (HCC)    per pt. stroke was ruled out still on plavix   Venous stasis    Vertigo     Surgeries: Procedure(s): LEFT TOTAL KNEE ARTHROPLASTY on 03/29/2023   Consultants:   Discharged Condition: Improved  Hospital Course: Kimberly Walsh is an 77 y.o. female who was admitted 03/29/2023 for operative treatment ofOA (osteoarthritis) of knee. Patient has severe unremitting pain that affects sleep, daily activities, and work/hobbies. After pre-op clearance the patient was taken to the operating room on 03/29/2023 and underwent  Procedure(s): LEFT TOTAL KNEE ARTHROPLASTY.    Patient was given perioperative antibiotics:  Anti-infectives (From admission, onward)    Start     Dose/Rate Route Frequency Ordered Stop   03/29/23 1330  ceFAZolin (ANCEF) IVPB 2g/100 mL premix        2 g 200 mL/hr over 30 Minutes Intravenous Every 6 hours 03/29/23 1032 03/29/23 1915   03/29/23 0600  ceFAZolin (ANCEF) IVPB 2g/100 mL premix        2 g 200 mL/hr over 30 Minutes Intravenous On call to O.R. 03/29/23 5784 03/29/23 0720        Patient was given sequential compression devices, early ambulation, and chemoprophylaxis to prevent DVT.  Patient benefited maximally from hospital stay and there were no complications.    Recent vital signs: No data found.   Recent laboratory studies: No results for input(s): "WBC", "HGB", "HCT", "PLT", "NA", "K", "CL", "CO2", "BUN", "CREATININE", "GLUCOSE", "INR", "CALCIUM" in the last 72 hours.  Invalid  input(s): "PT", "2"   Discharge Medications:   Allergies as of 03/30/2023       Reactions   Ace Inhibitors Cough        Medication List     TAKE these medications    acetaminophen 500 MG tablet Commonly known as: TYLENOL Take 1,000 mg by mouth every 8 (eight) hours as needed for moderate pain.   aspirin EC 325 MG tablet Take 1 tablet (325 mg total) by mouth daily for 20 days. Then take one 81 mg aspirin once a day for three weeks. Then discontinue aspirin.   atorvastatin 40 MG tablet Commonly known as: LIPITOR Take 1 tablet (40 mg total) by mouth daily.   clopidogrel 75 MG tablet Commonly known as: PLAVIX Take 1 tablet (75 mg total) by mouth daily.   furosemide 40 MG tablet Commonly known as: LASIX Take 1 tablet (40 mg total) by mouth daily.   levothyroxine 50 MCG tablet Commonly known as: SYNTHROID Take 1 tablet (50 mcg total) by mouth daily.   losartan 25 MG tablet Commonly known as: COZAAR Take 1 tablet (25 mg total) by mouth daily.   meclizine 25 MG tablet Commonly known as: ANTIVERT Take 1 tablet by mouth every 6 hours as need for vertigo   methocarbamol 500 MG tablet Commonly known as: ROBAXIN Take 1 tablet (500 mg total) by mouth every 6 (six) hours as needed for muscle spasms.   ondansetron 4 MG tablet Commonly known as:  ZOFRAN Take 1 tablet (4 mg total) by mouth every 6 (six) hours as needed for nausea.   oxyCODONE 5 MG immediate release tablet Commonly known as: Oxy IR/ROXICODONE Take 1-2 tablets (5-10 mg total) by mouth every 6 (six) hours as needed for severe pain. What changed: when to take this   traMADol 50 MG tablet Commonly known as: ULTRAM Take 1-2 tablets (50-100 mg total) by mouth every 6 (six) hours as needed for moderate pain.               Discharge Care Instructions  (From admission, onward)           Start     Ordered   03/30/23 0000  Weight bearing as tolerated        03/30/23 1157   03/30/23 0000  Change  dressing       Comments: You may remove the bulky bandage (ACE wrap and gauze) two days after surgery. You will have an adhesive waterproof bandage underneath. Leave this in place until your first follow-up appointment.   03/30/23 1157            Diagnostic Studies: No results found.  Disposition: Discharge disposition: 01-Home or Self Care       Discharge Instructions     Call MD / Call 911   Complete by: As directed    If you experience chest pain or shortness of breath, CALL 911 and be transported to the hospital emergency room.  If you develope a fever above 101 F, pus (white drainage) or increased drainage or redness at the wound, or calf pain, call your surgeon's office.   Change dressing   Complete by: As directed    You may remove the bulky bandage (ACE wrap and gauze) two days after surgery. You will have an adhesive waterproof bandage underneath. Leave this in place until your first follow-up appointment.   Constipation Prevention   Complete by: As directed    Drink plenty of fluids.  Prune juice may be helpful.  You may use a stool softener, such as Colace (over the counter) 100 mg twice a day.  Use MiraLax (over the counter) for constipation as needed.   Diet - low sodium heart healthy   Complete by: As directed    Do not put a pillow under the knee. Place it under the heel.   Complete by: As directed    Driving restrictions   Complete by: As directed    No driving for two weeks   Post-operative opioid taper instructions:   Complete by: As directed    POST-OPERATIVE OPIOID TAPER INSTRUCTIONS: It is important to wean off of your opioid medication as soon as possible. If you do not need pain medication after your surgery it is ok to stop day one. Opioids include: Codeine, Hydrocodone(Norco, Vicodin), Oxycodone(Percocet, oxycontin) and hydromorphone amongst others.  Long term and even short term use of opiods can cause: Increased pain  response Dependence Constipation Depression Respiratory depression And more.  Withdrawal symptoms can include Flu like symptoms Nausea, vomiting And more Techniques to manage these symptoms Hydrate well Eat regular healthy meals Stay active Use relaxation techniques(deep breathing, meditating, yoga) Do Not substitute Alcohol to help with tapering If you have been on opioids for less than two weeks and do not have pain than it is ok to stop all together.  Plan to wean off of opioids This plan should start within one week post op of your joint replacement. Maintain the  same interval or time between taking each dose and first decrease the dose.  Cut the total daily intake of opioids by one tablet each day Next start to increase the time between doses. The last dose that should be eliminated is the evening dose.      TED hose   Complete by: As directed    Use stockings (TED hose) for three weeks on both leg(s).  You may remove them at night for sleeping.   Weight bearing as tolerated   Complete by: As directed         Follow-up Information     Ollen Gross, MD. Go on 04/13/2023.   Specialty: Orthopedic Surgery Why: You are scheduled for first post op appt on Tuesday October 22 at 1:00pm. Contact information: [address] [place] [address] [phone]                  Signed: Arther Abbott 04/05/2023, 8:24 AM

## 2023-04-07 DIAGNOSIS — Z888 Allergy status to other drugs, medicaments and biological substances status: Secondary | ICD-10-CM | POA: Diagnosis not present

## 2023-04-07 DIAGNOSIS — R197 Diarrhea, unspecified: Secondary | ICD-10-CM | POA: Diagnosis not present

## 2023-04-07 DIAGNOSIS — R110 Nausea: Secondary | ICD-10-CM | POA: Diagnosis not present

## 2023-04-14 ENCOUNTER — Ambulatory Visit: Payer: Medicare Other | Admitting: Physical Therapy

## 2023-04-14 DIAGNOSIS — G8929 Other chronic pain: Secondary | ICD-10-CM | POA: Diagnosis not present

## 2023-04-14 DIAGNOSIS — M25562 Pain in left knee: Secondary | ICD-10-CM | POA: Diagnosis not present

## 2023-04-14 DIAGNOSIS — R600 Localized edema: Secondary | ICD-10-CM

## 2023-04-14 DIAGNOSIS — M6281 Muscle weakness (generalized): Secondary | ICD-10-CM | POA: Diagnosis not present

## 2023-04-14 DIAGNOSIS — M25662 Stiffness of left knee, not elsewhere classified: Secondary | ICD-10-CM | POA: Diagnosis not present

## 2023-04-14 NOTE — Therapy (Signed)
OUTPATIENT PHYSICAL THERAPY LOWER EXTREMITY EVALUATION   Patient Name: Kimberly Walsh MRN: [id number] DOB:1945/10/01, 77 y.o., female Today's Date: 04/14/2023  END OF SESSION:  PT End of Session - 04/14/23 1312     Visit Number 2    Number of Visits 12    Date for PT Re-Evaluation 07/01/23    Authorization Type FOTO.    PT Start Time 0105    PT Stop Time 0151    PT Time Calculation (min) 46 min    Activity Tolerance Patient tolerated treatment well    Behavior During Therapy WFL for tasks assessed/performed             Past Medical History:  Diagnosis Date   Arthritis    Cancer (HCC) 2016   skin cancer   Hyperlipidemia    Hypertension    Hypothyroidism    PONV (postoperative nausea and vomiting)    Stroke (HCC)    per pt. stroke was ruled out still on plavix   Venous stasis    Vertigo    Past Surgical History:  Procedure Laterality Date   basal carcinoma rt leg     CHOLECYSTECTOMY     FINGER SURGERY Left    Ring finger   MIDDLE EAR SURGERY     Fungus   TOTAL KNEE ARTHROPLASTY Right 12/14/2022   Procedure: TOTAL KNEE ARTHROPLASTY;  Surgeon: Ollen Gross, MD;  Location: [facility];  Service: Orthopedics;  Laterality: Right;   TOTAL KNEE ARTHROPLASTY Left 03/29/2023   Procedure: LEFT TOTAL KNEE ARTHROPLASTY;  Surgeon: Ollen Gross, MD;  Location: [facility];  Service: Orthopedics;  Laterality: Left;   TUBAL LIGATION     VEIN SURGERY     Patient Active Problem List   Diagnosis Date Noted   Osteoarthritis of left knee 03/29/2023   Primary osteoarthritis of left knee 12/14/2022   Primary osteoarthritis of right knee 12/14/2022   Completed stroke (HCC) 09/22/2019   PVC (premature ventricular contraction) 09/22/2019   Late effect of cerebrovascular accident (CVA) 04/20/2019   CKD (chronic kidney disease) stage 3, GFR 30-59 ml/min (HCC) 03/07/2018   Peripheral edema 09/15/2017   BMI 37.0-37.9, adult 09/28/2016   Mixed hyperlipidemia 05/05/2016   Essential  hypertension, benign 03/01/2014   Hypothyroidism 03/01/2014   OA (osteoarthritis) of knee 04/25/2010    REFERRING PROVIDER: Ollen Gross MD  REFERRING DIAG: Left total knee arthroplasty.  THERAPY DIAG:  Chronic pain of left knee  Stiffness of left knee, not elsewhere classified  Localized edema  Muscle weakness (generalized)  Rationale for Evaluation and Treatment: Rehabilitation  ONSET DATE: 03/29/23 (surgery).  SUBJECTIVE:   SUBJECTIVE STATEMENT: The patient has been out of PT due to PONV then constipation then an ED visit for diarrhea.  She is doing much better now.  PERTINENT HISTORY: Right total knee replacement. PAIN:  Are you having pain? Yes: NPRS scale: 8/10 Pain location: Left knee Pain description: Ache, sore, throbbing, sharp, numb. Aggravating factors: Moving. Relieving factors: Ice and medication.  PRECAUTIONS: Other: No ultrasound.  WEIGHT BEARING RESTRICTIONS: No  FALLS:  Has patient fallen in last 6 months? No  LIVING ENVIRONMENT: Lives in: House/apartment Stairs: One step. Has following equipment at home: Dan Humphreys - 2 wheeled  OCCUPATION: Retired.  PLOF: Independent with basic ADLs  PATIENT GOALS: decrease pain and do more.  OBJECTIVE:  Note: Objective measures were completed at Evaluation unless otherwise noted.  PATIENT SURVEYS:  FOTO 32.80  EDEMA:  Circumferential: Left 5 cms > right.  PALPATION: Diffuse anterior  left knee pain.  LOWER EXTREMITY ROM:  In supine:  -20 degrees of extension and passive to -15 degrees (within 5 degrees of right) and seated flexion to 75 degrees.       LOWER EXTREMITY ROM:     Active  Right eval Left 04/14/23  Knee flexion  110 degrees  Knee extension     LOWER EXTREMITY MMT:  Patient currently able to perform an antigravity left SLR and SAQ.  GAIT: Safe ambulation with a FWW with decreased step length.   TODAY'S TREATMENT:                                                                                                                               DATE:   04/14/23:                                       EXERCISE LOG  Exercise Repetitions and Resistance Comments  Nustep  Level 3 x 15 minutes moving seat forward x 2 to increase flexion.   SAQ's 2# x 5 minutes               In supine:  Gentle low load long duration stretching into flexion and extension x 6 minutes f/b LE elevation and vasopneumatic on low to patient's left knee x 15 minutes.       PATIENT EDUCATION:  Education details: See below. Person educated: Patient Education method: Explanation Education comprehension: verbalized understanding  HOME EXERCISE PROGRAM: Patient to perform HEP from hospital discharge.  ASSESSMENT:  CLINICAL IMPRESSION: Patient did very well with treatment today.  She has been out of PT as mentioned above.  Her Aquacel is now doffed as she has returned to her MD office since surgery and they were pleased with progress.  Her active left knee flexion after stretching was 100 degrees today.  She continues to walk with a FWW at this time.  OBJECTIVE IMPAIRMENTS: Abnormal gait, decreased activity tolerance, decreased ROM, decreased strength, increased edema, and pain.   ACTIVITY LIMITATIONS: carrying, lifting, bending, stairs, and locomotion level  PARTICIPATION LIMITATIONS: meal prep, cleaning, and laundry  PERSONAL FACTORS: Time since onset of injury/illness/exacerbation are also affecting patient's functional outcome.   REHAB POTENTIAL: Excellent  CLINICAL DECISION MAKING: Stable/uncomplicated  EVALUATION COMPLEXITY: Low   GOALS:  SHORT TERM GOALS: Target date: 04/16/23  Ind with an initial HEP.   LONG TERM GOALS: Target date: 07/01/23.  Ind with an initial HEP.  Goal status: INITIAL  2.  Active left knee extension to -5 degrees.  Goal status: INITIAL  3.  Active left knee flexion to 115 degrees+ so the patient can perform functional tasks and do so with pain  not > 2-3/10.  Goal status: INITIAL  4.  Increase left hip and knee strength to a solid 4+/5 to provide good stability for accomplishment of functional activities.  Goal status: INITIAL  5.  Perform a reciprocating stair gait with one railing with pain not > 2-3/10.  Goal status: INITIAL  6.  Perform ADL's with pain not > 3/10.  Goal status: INITIAL   PLAN:  PT FREQUENCY:  2-3 times a week  PT DURATION: 4 weeks  PLANNED INTERVENTIONS: 97110-Therapeutic exercises, 97530- Therapeutic activity, [id number]- Neuromuscular re-education, 97535- Self Care, [id number]- Manual therapy, [id number]- Gait training, 97014- Electrical stimulation (unattended), 97016- Vasopneumatic device, Patient/Family education, Cryotherapy, and Moist heat  PLAN FOR NEXT SESSION: Nustep.  Progress per TKA protocol.  LE elevation and vasopneumatic.   Jamichael Knotts, [place], PT 04/14/2023, 2:09 PM

## 2023-04-16 ENCOUNTER — Ambulatory Visit: Payer: Medicare Other | Admitting: Physical Therapy

## 2023-04-16 DIAGNOSIS — R600 Localized edema: Secondary | ICD-10-CM

## 2023-04-16 DIAGNOSIS — M6281 Muscle weakness (generalized): Secondary | ICD-10-CM | POA: Diagnosis not present

## 2023-04-16 DIAGNOSIS — M25562 Pain in left knee: Secondary | ICD-10-CM | POA: Diagnosis not present

## 2023-04-16 DIAGNOSIS — G8929 Other chronic pain: Secondary | ICD-10-CM | POA: Diagnosis not present

## 2023-04-16 DIAGNOSIS — M25662 Stiffness of left knee, not elsewhere classified: Secondary | ICD-10-CM | POA: Diagnosis not present

## 2023-04-16 NOTE — Therapy (Signed)
OUTPATIENT PHYSICAL THERAPY LOWER EXTREMITY EVALUATION   Patient Name: VIRLA CORDERY MRN: [id number] DOB:1945/11/28, 77 y.o., female Today's Date: 04/16/2023  END OF SESSION:  PT End of Session - 04/16/23 1226     Visit Number 3    Number of Visits 12    Date for PT Re-Evaluation 07/01/23    Authorization Type FOTO.    PT Start Time 1141    PT Stop Time 1230    PT Time Calculation (min) 49 min    Activity Tolerance Patient tolerated treatment well    Behavior During Therapy WFL for tasks assessed/performed              Past Medical History:  Diagnosis Date   Arthritis    Cancer (HCC) 2016   skin cancer   Hyperlipidemia    Hypertension    Hypothyroidism    PONV (postoperative nausea and vomiting)    Stroke (HCC)    per pt. stroke was ruled out still on plavix   Venous stasis    Vertigo    Past Surgical History:  Procedure Laterality Date   basal carcinoma rt leg     CHOLECYSTECTOMY     FINGER SURGERY Left    Ring finger   MIDDLE EAR SURGERY     Fungus   TOTAL KNEE ARTHROPLASTY Right 12/14/2022   Procedure: TOTAL KNEE ARTHROPLASTY;  Surgeon: Ollen Gross, MD;  Location: [facility];  Service: Orthopedics;  Laterality: Right;   TOTAL KNEE ARTHROPLASTY Left 03/29/2023   Procedure: LEFT TOTAL KNEE ARTHROPLASTY;  Surgeon: Ollen Gross, MD;  Location: [facility];  Service: Orthopedics;  Laterality: Left;   TUBAL LIGATION     VEIN SURGERY     Patient Active Problem List   Diagnosis Date Noted   Osteoarthritis of left knee 03/29/2023   Primary osteoarthritis of left knee 12/14/2022   Primary osteoarthritis of right knee 12/14/2022   Completed stroke (HCC) 09/22/2019   PVC (premature ventricular contraction) 09/22/2019   Late effect of cerebrovascular accident (CVA) 04/20/2019   CKD (chronic kidney disease) stage 3, GFR 30-59 ml/min (HCC) 03/07/2018   Peripheral edema 09/15/2017   BMI 37.0-37.9, adult 09/28/2016   Mixed hyperlipidemia 05/05/2016   Essential  hypertension, benign 03/01/2014   Hypothyroidism 03/01/2014   OA (osteoarthritis) of knee 04/25/2010    REFERRING PROVIDER: Ollen Gross MD  REFERRING DIAG: Left total knee arthroplasty.  THERAPY DIAG:  Chronic pain of left knee  Stiffness of left knee, not elsewhere classified  Localized edema  Rationale for Evaluation and Treatment: Rehabilitation  ONSET DATE: 03/29/23 (surgery).  SUBJECTIVE:   SUBJECTIVE STATEMENT:  Pain still high.  PERTINENT HISTORY: Right total knee replacement. PAIN:  Are you having pain? Yes: NPRS scale: 8/10 Pain location: Left knee Pain description: Ache, sore, throbbing, sharp, numb. Aggravating factors: Moving. Relieving factors: Ice and medication.  PRECAUTIONS: Other: No ultrasound.  WEIGHT BEARING RESTRICTIONS: No  FALLS:  Has patient fallen in last 6 months? No  LIVING ENVIRONMENT: Lives in: House/apartment Stairs: One step. Has following equipment at home: Dan Humphreys - 2 wheeled  OCCUPATION: Retired.  PLOF: Independent with basic ADLs  PATIENT GOALS: decrease pain and do more.  OBJECTIVE:  Note: Objective measures were completed at Evaluation unless otherwise noted.  PATIENT SURVEYS:  FOTO 32.80  EDEMA:  Circumferential: Left 5 cms > right.  PALPATION: Diffuse anterior left knee pain.  LOWER EXTREMITY ROM:  In supine:  -20 degrees of extension and passive to -15 degrees (within 5 degrees of  right) and seated flexion to 75 degrees.       LOWER EXTREMITY ROM:     Active  Right eval Left 04/14/23  Knee flexion  110 degrees  Knee extension     LOWER EXTREMITY MMT:  Patient currently able to perform an antigravity left SLR and SAQ.  GAIT: Safe ambulation with a FWW with decreased step length.   TODAY'S TREATMENT:                                                                                                                              DATE:    04/16/23:  Nustep level 2 x 16 minutes moving seat forward x  2 to increase flexion f/b 2# SAQ's x 3 minutes f/b PROM into left knee flexion and  extension with low load long duration technique utilized x 7 minutes f/b f/b LE elevation and vasopneumatic on low to patient's left knee x 15 minutes.       04/14/23:                                       EXERCISE LOG  Exercise Repetitions and Resistance Comments  Nustep  Level 3 x 15 minutes moving seat forward x 2 to increase flexion.   SAQ's 2# x 5 minutes               In supine:  Gentle low load long duration stretching into flexion and extension x 6 minutes f/b LE elevation and vasopneumatic on low to patient's left knee x 15 minutes.       PATIENT EDUCATION:  Education details: See below. Person educated: Patient Education method: Explanation Education comprehension: verbalized understanding  HOME EXERCISE PROGRAM: Patient to perform HEP from hospital discharge.  ASSESSMENT:  CLINICAL IMPRESSION: Patient did very well with treatment today.  Her pain was high upon presentation to the clinic but felt better after Nustep exercise.  Her rang eof motion is improving nicely.  OBJECTIVE IMPAIRMENTS: Abnormal gait, decreased activity tolerance, decreased ROM, decreased strength, increased edema, and pain.   ACTIVITY LIMITATIONS: carrying, lifting, bending, stairs, and locomotion level  PARTICIPATION LIMITATIONS: meal prep, cleaning, and laundry  PERSONAL FACTORS: Time since onset of injury/illness/exacerbation are also affecting patient's functional outcome.   REHAB POTENTIAL: Excellent  CLINICAL DECISION MAKING: Stable/uncomplicated  EVALUATION COMPLEXITY: Low   GOALS:  SHORT TERM GOALS: Target date: 04/16/23  Ind with an initial HEP.   LONG TERM GOALS: Target date: 07/01/23.  Ind with an initial HEP.  Goal status: INITIAL  2.  Active left knee extension to -5 degrees.  Goal status: INITIAL  3.  Active left knee flexion to 115 degrees+ so the patient can perform functional  tasks and do so with pain not > 2-3/10.  Goal status: INITIAL  4.  Increase left hip and knee strength to a solid 4+/5 to  provide good stability for accomplishment of functional activities. Goal status: INITIAL  5.  Perform a reciprocating stair gait with one railing with pain not > 2-3/10.  Goal status: INITIAL  6.  Perform ADL's with pain not > 3/10.  Goal status: INITIAL   PLAN:  PT FREQUENCY:  2-3 times a week  PT DURATION: 4 weeks  PLANNED INTERVENTIONS: 97110-Therapeutic exercises, 97530- Therapeutic activity, [id number]- Neuromuscular re-education, 97535- Self Care, 40981- Manual therapy, [id number]- Gait training, 97014- Electrical stimulation (unattended), 97016- Vasopneumatic device, Patient/Family education, Cryotherapy, and Moist heat  PLAN FOR NEXT SESSION: Nustep.  Progress per TKA protocol.  LE elevation and vasopneumatic.   Danese Dorsainvil, [place], PT 04/16/2023, 12:32 PM

## 2023-04-20 ENCOUNTER — Ambulatory Visit: Payer: Medicare Other

## 2023-04-20 DIAGNOSIS — M25662 Stiffness of left knee, not elsewhere classified: Secondary | ICD-10-CM | POA: Diagnosis not present

## 2023-04-20 DIAGNOSIS — R600 Localized edema: Secondary | ICD-10-CM | POA: Diagnosis not present

## 2023-04-20 DIAGNOSIS — G8929 Other chronic pain: Secondary | ICD-10-CM | POA: Diagnosis not present

## 2023-04-20 DIAGNOSIS — M6281 Muscle weakness (generalized): Secondary | ICD-10-CM

## 2023-04-20 DIAGNOSIS — M25562 Pain in left knee: Secondary | ICD-10-CM | POA: Diagnosis not present

## 2023-04-20 NOTE — Therapy (Signed)
OUTPATIENT PHYSICAL THERAPY LOWER EXTREMITY EVALUATION   Patient Name: Kimberly Walsh MRN: [id number] DOB:1946-02-07, 77 y.o., female Today's Date: 04/20/2023  END OF SESSION:  PT End of Session - 04/20/23 1303     Visit Number 4    Number of Visits 12    Date for PT Re-Evaluation 07/01/23    Authorization Type FOTO.    PT Start Time 1300    PT Stop Time 1400    PT Time Calculation (min) 60 min    Activity Tolerance Patient tolerated treatment well    Behavior During Therapy WFL for tasks assessed/performed              Past Medical History:  Diagnosis Date   Arthritis    Cancer (HCC) 2016   skin cancer   Hyperlipidemia    Hypertension    Hypothyroidism    PONV (postoperative nausea and vomiting)    Stroke (HCC)    per pt. stroke was ruled out still on plavix   Venous stasis    Vertigo    Past Surgical History:  Procedure Laterality Date   basal carcinoma rt leg     CHOLECYSTECTOMY     FINGER SURGERY Left    Ring finger   MIDDLE EAR SURGERY     Fungus   TOTAL KNEE ARTHROPLASTY Right 12/14/2022   Procedure: TOTAL KNEE ARTHROPLASTY;  Surgeon: Ollen Gross, MD;  Location: [facility];  Service: Orthopedics;  Laterality: Right;   TOTAL KNEE ARTHROPLASTY Left 03/29/2023   Procedure: LEFT TOTAL KNEE ARTHROPLASTY;  Surgeon: Ollen Gross, MD;  Location: [facility];  Service: Orthopedics;  Laterality: Left;   TUBAL LIGATION     VEIN SURGERY     Patient Active Problem List   Diagnosis Date Noted   Osteoarthritis of left knee 03/29/2023   Primary osteoarthritis of left knee 12/14/2022   Primary osteoarthritis of right knee 12/14/2022   Completed stroke (HCC) 09/22/2019   PVC (premature ventricular contraction) 09/22/2019   Late effect of cerebrovascular accident (CVA) 04/20/2019   CKD (chronic kidney disease) stage 3, GFR 30-59 ml/min (HCC) 03/07/2018   Peripheral edema 09/15/2017   BMI 37.0-37.9, adult 09/28/2016   Mixed hyperlipidemia 05/05/2016   Essential  hypertension, benign 03/01/2014   Hypothyroidism 03/01/2014   OA (osteoarthritis) of knee 04/25/2010    REFERRING PROVIDER: Ollen Gross MD  REFERRING DIAG: Left total knee arthroplasty.  THERAPY DIAG:  Chronic pain of left knee  Stiffness of left knee, not elsewhere classified  Localized edema  Muscle weakness (generalized)  Rationale for Evaluation and Treatment: Rehabilitation  ONSET DATE: 03/29/23 (surgery).  SUBJECTIVE:   SUBJECTIVE STATEMENT:  Pain still high.  PERTINENT HISTORY: Right total knee replacement. PAIN:  Are you having pain? Yes: NPRS scale: 8/10 Pain location: Left knee Pain description: Ache, sore, throbbing, sharp, numb. Aggravating factors: Moving. Relieving factors: Ice and medication.  PRECAUTIONS: Other: No ultrasound.  WEIGHT BEARING RESTRICTIONS: No  FALLS:  Has patient fallen in last 6 months? No  LIVING ENVIRONMENT: Lives in: House/apartment Stairs: One step. Has following equipment at home: Dan Humphreys - 2 wheeled  OCCUPATION: Retired.  PLOF: Independent with basic ADLs  PATIENT GOALS: decrease pain and do more.  OBJECTIVE:  Note: Objective measures were completed at Evaluation unless otherwise noted.  PATIENT SURVEYS:  FOTO 32.80  EDEMA:  Circumferential: Left 5 cms > right.  PALPATION: Diffuse anterior left knee pain.  LOWER EXTREMITY ROM:  In supine:  -20 degrees of extension and passive to -15 degrees (  within 5 degrees of right) and seated flexion to 75 degrees.       LOWER EXTREMITY ROM:     Active  Right eval Left 04/14/23  Knee flexion  110 degrees  Knee extension     LOWER EXTREMITY MMT:  Patient currently able to perform an antigravity left SLR and SAQ.  GAIT: Safe ambulation with a FWW with decreased step length.   TODAY'S TREATMENT:                                                                                                                              DATE:  04/20/23:                                    EXERCISE LOG  Exercise Repetitions and Resistance Comments  Nustep Lvl 3 x 18 mins   Rockerboard 3 mins   Forward Step Ups 4" box x 15 reps   LAQs 2# x 20 reps   Seated Marches 2# x 20 reps   Seated Hip Abduction Red x 2 mins   Seated Hip Adduction 2 mins    Blank cell = exercise not performed today   Modalities  Date:  Vaso: Knee, 34 degrees; low pressure, 15 mins, Pain and Edema   04/16/23:  Nustep level 2 x 16 minutes moving seat forward x 2 to increase flexion f/b 2# SAQ's x 3 minutes f/b PROM into left knee flexion and  extension with low load long duration technique utilized x 7 minutes f/b f/b LE elevation and vasopneumatic on low to patient's left knee x 15 minutes.          PATIENT EDUCATION:  Education details: See below. Person educated: Patient Education method: Explanation Education comprehension: verbalized understanding  HOME EXERCISE PROGRAM: Patient to perform HEP from hospital discharge.  ASSESSMENT:  CLINICAL IMPRESSION: Pt arrives for today's treatment session reporting 8/10 left knee pain.  Pt able to increase FOTO score to 49 today.  Pt instructed in standing and seated LLE exercises today.  Pt requiring min cues for proper technique with all newly added exercises.  Pt reported increased fatigue at completion of today's treatment session.  Normal responses to vaso noted upon removal.  Pt reported decreased pain at completion of today's treatment session.   OBJECTIVE IMPAIRMENTS: Abnormal gait, decreased activity tolerance, decreased ROM, decreased strength, increased edema, and pain.   ACTIVITY LIMITATIONS: carrying, lifting, bending, stairs, and locomotion level  PARTICIPATION LIMITATIONS: meal prep, cleaning, and laundry  PERSONAL FACTORS: Time since onset of injury/illness/exacerbation are also affecting patient's functional outcome.   REHAB POTENTIAL: Excellent  CLINICAL DECISION MAKING: Stable/uncomplicated  EVALUATION COMPLEXITY:  Low   GOALS:  SHORT TERM GOALS: Target date: 04/16/23  Ind with an initial HEP.   LONG TERM GOALS: Target date: 07/01/23.  Ind with an initial HEP.  Goal status: INITIAL  2.  Active left knee extension to -5 degrees.  Goal status: INITIAL  3.  Active left knee flexion to 115 degrees+ so the patient can perform functional tasks and do so with pain not > 2-3/10.  Goal status: INITIAL  4.  Increase left hip and knee strength to a solid 4+/5 to provide good stability for accomplishment of functional activities. Goal status: INITIAL  5.  Perform a reciprocating stair gait with one railing with pain not > 2-3/10.  Goal status: INITIAL  6.  Perform ADL's with pain not > 3/10.  Goal status: INITIAL   PLAN:  PT FREQUENCY:  2-3 times a week  PT DURATION: 4 weeks  PLANNED INTERVENTIONS: 97110-Therapeutic exercises, 97530- Therapeutic activity, [id number]- Neuromuscular re-education, 97535- Self Care, [id number]- Manual therapy, [id number]- Gait training, 97014- Electrical stimulation (unattended), 97016- Vasopneumatic device, Patient/Family education, Cryotherapy, and Moist heat  PLAN FOR NEXT SESSION: Nustep.  Progress per TKA protocol.  LE elevation and vasopneumatic.   Newman Pies, PTA 04/20/2023, 2:06 PM

## 2023-04-22 ENCOUNTER — Ambulatory Visit: Payer: Medicare Other | Admitting: Physical Therapy

## 2023-04-22 DIAGNOSIS — M25662 Stiffness of left knee, not elsewhere classified: Secondary | ICD-10-CM | POA: Diagnosis not present

## 2023-04-22 DIAGNOSIS — M6281 Muscle weakness (generalized): Secondary | ICD-10-CM | POA: Diagnosis not present

## 2023-04-22 DIAGNOSIS — R600 Localized edema: Secondary | ICD-10-CM

## 2023-04-22 DIAGNOSIS — G8929 Other chronic pain: Secondary | ICD-10-CM

## 2023-04-22 DIAGNOSIS — M25562 Pain in left knee: Secondary | ICD-10-CM | POA: Diagnosis not present

## 2023-04-22 NOTE — Therapy (Signed)
OUTPATIENT PHYSICAL THERAPY LOWER EXTREMITY EVALUATION   Patient Name: Kimberly Walsh MRN: [id number] DOB:08-Nov-1945, 77 y.o., female Today's Date: 04/22/2023  END OF SESSION:  PT End of Session - 04/22/23 1309     Visit Number 5    Number of Visits 12    Date for PT Re-Evaluation 07/01/23    Authorization Type FOTO.    PT Start Time 0100    Activity Tolerance Patient tolerated treatment well    Behavior During Therapy WFL for tasks assessed/performed               Past Medical History:  Diagnosis Date   Arthritis    Cancer (HCC) 2016   skin cancer   Hyperlipidemia    Hypertension    Hypothyroidism    PONV (postoperative nausea and vomiting)    Stroke (HCC)    per pt. stroke was ruled out still on plavix   Venous stasis    Vertigo    Past Surgical History:  Procedure Laterality Date   basal carcinoma rt leg     CHOLECYSTECTOMY     FINGER SURGERY Left    Ring finger   MIDDLE EAR SURGERY     Fungus   TOTAL KNEE ARTHROPLASTY Right 12/14/2022   Procedure: TOTAL KNEE ARTHROPLASTY;  Surgeon: Ollen Gross, MD;  Location: [facility];  Service: Orthopedics;  Laterality: Right;   TOTAL KNEE ARTHROPLASTY Left 03/29/2023   Procedure: LEFT TOTAL KNEE ARTHROPLASTY;  Surgeon: Ollen Gross, MD;  Location: [facility];  Service: Orthopedics;  Laterality: Left;   TUBAL LIGATION     VEIN SURGERY     Patient Active Problem List   Diagnosis Date Noted   Osteoarthritis of left knee 03/29/2023   Primary osteoarthritis of left knee 12/14/2022   Primary osteoarthritis of right knee 12/14/2022   Completed stroke (HCC) 09/22/2019   PVC (premature ventricular contraction) 09/22/2019   Late effect of cerebrovascular accident (CVA) 04/20/2019   CKD (chronic kidney disease) stage 3, GFR 30-59 ml/min (HCC) 03/07/2018   Peripheral edema 09/15/2017   BMI 37.0-37.9, adult 09/28/2016   Mixed hyperlipidemia 05/05/2016   Essential hypertension, benign 03/01/2014   Hypothyroidism 03/01/2014    OA (osteoarthritis) of knee 04/25/2010    REFERRING PROVIDER: Ollen Gross MD  REFERRING DIAG: Left total knee arthroplasty.  THERAPY DIAG:  Chronic pain of left knee  Stiffness of left knee, not elsewhere classified  Localized edema  Rationale for Evaluation and Treatment: Rehabilitation  ONSET DATE: 03/29/23 (surgery).  SUBJECTIVE:   SUBJECTIVE STATEMENT: Very sore.  PERTINENT HISTORY: Right total knee replacement. PAIN:  Are you having pain? Yes: NPRS scale: 8/10 Pain location: Left knee Pain description: Ache, sore, throbbing, sharp, numb. Aggravating factors: Moving. Relieving factors: Ice and medication.  PRECAUTIONS: Other: No ultrasound.  WEIGHT BEARING RESTRICTIONS: No  FALLS:  Has patient fallen in last 6 months? No  LIVING ENVIRONMENT: Lives in: House/apartment Stairs: One step. Has following equipment at home: Dan Humphreys - 2 wheeled  OCCUPATION: Retired.  PLOF: Independent with basic ADLs  PATIENT GOALS: decrease pain and do more.  OBJECTIVE:  Note: Objective measures were completed at Evaluation unless otherwise noted.  PATIENT SURVEYS:  FOTO 32.80  EDEMA:  Circumferential: Left 5 cms > right.  PALPATION: Diffuse anterior left knee pain.  LOWER EXTREMITY ROM:  In supine:  -20 degrees of extension and passive to -15 degrees (within 5 degrees of right) and seated flexion to 75 degrees.       LOWER EXTREMITY ROM:  Active  Right eval Left 04/14/23  Knee flexion  110 degrees  Knee extension     LOWER EXTREMITY MMT:  Patient currently able to perform an antigravity left SLR and SAQ.  GAIT: Safe ambulation with a FWW with decreased step length.   TODAY'S TREATMENT:                                                                                                                              DATE:   04/22/23:  Nustep level 3 x 15 minutes f/b SAQ's with 4# x 3 minutes f/b PROM x 8 minutes into left knee flexion and  extension  with low load long duration stretching technique utilized f/b LE elevation and vasopneumatic and IFC at 80-150 Hz on 40% scan x 15 minutes.   04/20/23:                                   EXERCISE LOG  Exercise Repetitions and Resistance Comments  Nustep Lvl 3 x 18 mins   Rockerboard 3 mins   Forward Step Ups 4" box x 15 reps   LAQs 2# x 20 reps   Seated Marches 2# x 20 reps   Seated Hip Abduction Red x 2 mins   Seated Hip Adduction 2 mins    Blank cell = exercise not performed today   Modalities  Date:  Vaso: Knee, 34 degrees; low pressure, 15 mins, Pain and Edema   04/16/23:  Nustep level 2 x 16 minutes moving seat forward x 2 to increase flexion f/b 2# SAQ's x 3 minutes f/b PROM into left knee flexion and  extension with low load long duration technique utilized x 7 minutes f/b f/b LE elevation and vasopneumatic on low to patient's left knee x 15 minutes.   Normal modality response following removal of modality.       PATIENT EDUCATION:  Education details: See below. Person educated: Patient Education method: Explanation Education comprehension: verbalized understanding  HOME EXERCISE PROGRAM: Patient to perform HEP from hospital discharge.  ASSESSMENT:  CLINICAL IMPRESSION: Patient with increased soreness today.  She is walking safely with a straight cane.  She did well with treatment today and her range of motion is improving nicely.  She enjoyed the electrical stimulation and felt better after treatment.  OBJECTIVE IMPAIRMENTS: Abnormal gait, decreased activity tolerance, decreased ROM, decreased strength, increased edema, and pain.   ACTIVITY LIMITATIONS: carrying, lifting, bending, stairs, and locomotion level  PARTICIPATION LIMITATIONS: meal prep, cleaning, and laundry  PERSONAL FACTORS: Time since onset of injury/illness/exacerbation are also affecting patient's functional outcome.   REHAB POTENTIAL: Excellent  CLINICAL DECISION MAKING:  Stable/uncomplicated  EVALUATION COMPLEXITY: Low   GOALS:  SHORT TERM GOALS: Target date: 04/16/23  Ind with an initial HEP.   LONG TERM GOALS: Target date: 07/01/23.  Ind with an initial HEP.  Goal status: INITIAL  2.  Active left knee extension to -5 degrees.  Goal status: INITIAL  3.  Active left knee flexion to 115 degrees+ so the patient can perform functional tasks and do so with pain not > 2-3/10.  Goal status: INITIAL  4.  Increase left hip and knee strength to a solid 4+/5 to provide good stability for accomplishment of functional activities. Goal status: INITIAL  5.  Perform a reciprocating stair gait with one railing with pain not > 2-3/10.  Goal status: INITIAL  6.  Perform ADL's with pain not > 3/10.  Goal status: INITIAL   PLAN:  PT FREQUENCY:  2-3 times a week  PT DURATION: 4 weeks  PLANNED INTERVENTIONS: 97110-Therapeutic exercises, 97530- Therapeutic activity, [id number]- Neuromuscular re-education, 97535- Self Care, [id number]- Manual therapy, [id number]- Gait training, 97014- Electrical stimulation (unattended), 97016- Vasopneumatic device, Patient/Family education, Cryotherapy, and Moist heat  PLAN FOR NEXT SESSION: Nustep.  Progress per TKA protocol.  LE elevation and vasopneumatic.   Seddrick Flax, [place], PT 04/22/2023, 1:09 PM

## 2023-04-26 ENCOUNTER — Ambulatory Visit: Payer: Medicare Other

## 2023-04-27 ENCOUNTER — Ambulatory Visit: Payer: Medicare Other | Attending: Orthopedic Surgery | Admitting: Physical Therapy

## 2023-04-27 DIAGNOSIS — R600 Localized edema: Secondary | ICD-10-CM | POA: Diagnosis not present

## 2023-04-27 DIAGNOSIS — M25562 Pain in left knee: Secondary | ICD-10-CM | POA: Insufficient documentation

## 2023-04-27 DIAGNOSIS — M25662 Stiffness of left knee, not elsewhere classified: Secondary | ICD-10-CM | POA: Insufficient documentation

## 2023-04-27 DIAGNOSIS — M6281 Muscle weakness (generalized): Secondary | ICD-10-CM | POA: Insufficient documentation

## 2023-04-27 DIAGNOSIS — G8929 Other chronic pain: Secondary | ICD-10-CM | POA: Diagnosis not present

## 2023-04-27 NOTE — Therapy (Signed)
OUTPATIENT PHYSICAL THERAPY LOWER EXTREMITY EVALUATION   Patient Name: Kimberly Walsh MRN: [id number] DOB:02/22/1946, 77 y.o., female Today's Date: 04/27/2023  END OF SESSION:  PT End of Session - 04/27/23 1355     Visit Number 6    Number of Visits 12    Date for PT Re-Evaluation 07/01/23    Authorization Type FOTO.    PT Start Time 0145    PT Stop Time 0239    PT Time Calculation (min) 54 min    Activity Tolerance Patient tolerated treatment well    Behavior During Therapy WFL for tasks assessed/performed               Past Medical History:  Diagnosis Date   Arthritis    Cancer (HCC) 2016   skin cancer   Hyperlipidemia    Hypertension    Hypothyroidism    PONV (postoperative nausea and vomiting)    Stroke (HCC)    per pt. stroke was ruled out still on plavix   Venous stasis    Vertigo    Past Surgical History:  Procedure Laterality Date   basal carcinoma rt leg     CHOLECYSTECTOMY     FINGER SURGERY Left    Ring finger   MIDDLE EAR SURGERY     Fungus   TOTAL KNEE ARTHROPLASTY Right 12/14/2022   Procedure: TOTAL KNEE ARTHROPLASTY;  Surgeon: Ollen Gross, MD;  Location: [facility];  Service: Orthopedics;  Laterality: Right;   TOTAL KNEE ARTHROPLASTY Left 03/29/2023   Procedure: LEFT TOTAL KNEE ARTHROPLASTY;  Surgeon: Ollen Gross, MD;  Location: [facility];  Service: Orthopedics;  Laterality: Left;   TUBAL LIGATION     VEIN SURGERY     Patient Active Problem List   Diagnosis Date Noted   Osteoarthritis of left knee 03/29/2023   Primary osteoarthritis of left knee 12/14/2022   Primary osteoarthritis of right knee 12/14/2022   Completed stroke (HCC) 09/22/2019   PVC (premature ventricular contraction) 09/22/2019   Late effect of cerebrovascular accident (CVA) 04/20/2019   CKD (chronic kidney disease) stage 3, GFR 30-59 ml/min (HCC) 03/07/2018   Peripheral edema 09/15/2017   BMI 37.0-37.9, adult 09/28/2016   Mixed hyperlipidemia 05/05/2016   Essential  hypertension, benign 03/01/2014   Hypothyroidism 03/01/2014   OA (osteoarthritis) of knee 04/25/2010    REFERRING PROVIDER: Ollen Gross MD  REFERRING DIAG: Left total knee arthroplasty.  THERAPY DIAG:  Chronic pain of left knee  Stiffness of left knee, not elsewhere classified  Localized edema  Rationale for Evaluation and Treatment: Rehabilitation  ONSET DATE: 03/29/23 (surgery).  SUBJECTIVE:   SUBJECTIVE STATEMENT: Still real sore.  PERTINENT HISTORY: Right total knee replacement. PAIN:  Are you having pain? Yes: NPRS scale: 5/10 Pain location: Left knee Pain description: Ache, sore, throbbing, sharp, numb. Aggravating factors: Moving. Relieving factors: Ice and medication.  PRECAUTIONS: Other: No ultrasound.  WEIGHT BEARING RESTRICTIONS: No  FALLS:  Has patient fallen in last 6 months? No  LIVING ENVIRONMENT: Lives in: House/apartment Stairs: One step. Has following equipment at home: Dan Humphreys - 2 wheeled  OCCUPATION: Retired.  PLOF: Independent with basic ADLs  PATIENT GOALS: decrease pain and do more.  OBJECTIVE:  Note: Objective measures were completed at Evaluation unless otherwise noted.  PATIENT SURVEYS:  FOTO 32.80  EDEMA:  Circumferential: Left 5 cms > right.  PALPATION: Diffuse anterior left knee pain.  LOWER EXTREMITY ROM:  In supine:  -20 degrees of extension and passive to -15 degrees (within 5 degrees of  right) and seated flexion to 75 degrees.       LOWER EXTREMITY ROM:     Active  Right eval Left 04/14/23  Knee flexion  110 degrees  Knee extension     LOWER EXTREMITY MMT:  Patient currently able to perform an antigravity left SLR and SAQ.  GAIT: Safe ambulation with a FWW with decreased step length.   TODAY'S TREATMENT:                                                                                                                              DATE:   04/27/23:  Nustep level 3 x 15 minutes moving seat forward x 2 to  increase flexion f/b STW/M x 8 minutes while receiving a sustained left knee overpressure stretch f/b LE elevation and vasopneumatic and IFC at 80-150 Hz on 40% scan x 20 minutes.  Normal modality response following removal of modality.    04/22/23:  Nustep level 3 x 15 minutes f/b SAQ's with 4# x 3 minutes f/b PROM x 8 minutes into left knee flexion and  extension with low load long duration stretching technique utilized f/b LE elevation and vasopneumatic and IFC at 80-150 Hz on 40% scan x 15 minutes.   04/20/23:                                   EXERCISE LOG  Exercise Repetitions and Resistance Comments  Nustep Lvl 3 x 18 mins   Rockerboard 3 mins   Forward Step Ups 4" box x 15 reps   LAQs 2# x 20 reps   Seated Marches 2# x 20 reps   Seated Hip Abduction Red x 2 mins   Seated Hip Adduction 2 mins    Blank cell = exercise not performed today   Modalities  Date:  Vaso: Knee, 34 degrees; low pressure, 15 mins, Pain and Edema   04/16/23:  Nustep level 2 x 16 minutes moving seat forward x 2 to increase flexion f/b 2# SAQ's x 3 minutes f/b PROM into left knee flexion and  extension with low load long duration technique utilized x 7 minutes f/b f/b LE elevation and vasopneumatic on low to patient's left knee x 15 minutes.   Normal modality response following removal of modality.       PATIENT EDUCATION:  Education details: See below. Person educated: Patient Education method: Explanation Education comprehension: verbalized understanding  HOME EXERCISE PROGRAM: Patient to perform HEP from hospital discharge.  ASSESSMENT:  CLINICAL IMPRESSION: The patient continues to report left knee soreness.  She did well with soft tissue work and felt good after treatment.  Her incision looks excellent.  OBJECTIVE IMPAIRMENTS: Abnormal gait, decreased activity tolerance, decreased ROM, decreased strength, increased edema, and pain.   ACTIVITY LIMITATIONS: carrying, lifting, bending, stairs,  and locomotion level  PARTICIPATION LIMITATIONS: meal prep, cleaning, and laundry  PERSONAL  FACTORS: Time since onset of injury/illness/exacerbation are also affecting patient's functional outcome.   REHAB POTENTIAL: Excellent  CLINICAL DECISION MAKING: Stable/uncomplicated  EVALUATION COMPLEXITY: Low   GOALS:  SHORT TERM GOALS: Target date: 04/16/23  Ind with an initial HEP.   LONG TERM GOALS: Target date: 07/01/23.  Ind with an initial HEP.  Goal status: INITIAL  2.  Active left knee extension to -5 degrees.  Goal status: INITIAL  3.  Active left knee flexion to 115 degrees+ so the patient can perform functional tasks and do so with pain not > 2-3/10.  Goal status: INITIAL  4.  Increase left hip and knee strength to a solid 4+/5 to provide good stability for accomplishment of functional activities. Goal status: INITIAL  5.  Perform a reciprocating stair gait with one railing with pain not > 2-3/10.  Goal status: INITIAL  6.  Perform ADL's with pain not > 3/10.  Goal status: INITIAL   PLAN:  PT FREQUENCY:  2-3 times a week  PT DURATION: 4 weeks  PLANNED INTERVENTIONS: 97110-Therapeutic exercises, 97530- Therapeutic activity, [id number]- Neuromuscular re-education, 97535- Self Care, 10272- Manual therapy, [id number]- Gait training, 97014- Electrical stimulation (unattended), 97016- Vasopneumatic device, Patient/Family education, Cryotherapy, and Moist heat  PLAN FOR NEXT SESSION: Nustep.  Progress per TKA protocol.  LE elevation and vasopneumatic.   Alessio Bogan, [place], PT 04/27/2023, 2:47 PM

## 2023-04-29 ENCOUNTER — Ambulatory Visit: Payer: Medicare Other

## 2023-04-29 DIAGNOSIS — R600 Localized edema: Secondary | ICD-10-CM

## 2023-04-29 DIAGNOSIS — M6281 Muscle weakness (generalized): Secondary | ICD-10-CM | POA: Diagnosis not present

## 2023-04-29 DIAGNOSIS — G8929 Other chronic pain: Secondary | ICD-10-CM | POA: Diagnosis not present

## 2023-04-29 DIAGNOSIS — M25562 Pain in left knee: Secondary | ICD-10-CM | POA: Diagnosis not present

## 2023-04-29 DIAGNOSIS — M25662 Stiffness of left knee, not elsewhere classified: Secondary | ICD-10-CM

## 2023-04-29 NOTE — Therapy (Signed)
OUTPATIENT PHYSICAL THERAPY LOWER EXTREMITY TREATMENT   Patient Name: Kimberly Walsh MRN: [id number] DOB:15-Jun-1946, 77 y.o., female Today's Date: 04/29/2023  END OF SESSION:  PT End of Session - 04/29/23 1305     Visit Number 7    Number of Visits 12    Date for PT Re-Evaluation 07/01/23    Authorization Type FOTO.    PT Start Time 1300    PT Stop Time 1358    PT Time Calculation (min) 58 min    Activity Tolerance Patient tolerated treatment well    Behavior During Therapy WFL for tasks assessed/performed               Past Medical History:  Diagnosis Date   Arthritis    Cancer (HCC) 2016   skin cancer   Hyperlipidemia    Hypertension    Hypothyroidism    PONV (postoperative nausea and vomiting)    Stroke (HCC)    per pt. stroke was ruled out still on plavix   Venous stasis    Vertigo    Past Surgical History:  Procedure Laterality Date   basal carcinoma rt leg     CHOLECYSTECTOMY     FINGER SURGERY Left    Ring finger   MIDDLE EAR SURGERY     Fungus   TOTAL KNEE ARTHROPLASTY Right 12/14/2022   Procedure: TOTAL KNEE ARTHROPLASTY;  Surgeon: Ollen Gross, MD;  Location: [facility];  Service: Orthopedics;  Laterality: Right;   TOTAL KNEE ARTHROPLASTY Left 03/29/2023   Procedure: LEFT TOTAL KNEE ARTHROPLASTY;  Surgeon: Ollen Gross, MD;  Location: [facility];  Service: Orthopedics;  Laterality: Left;   TUBAL LIGATION     VEIN SURGERY     Patient Active Problem List   Diagnosis Date Noted   Osteoarthritis of left knee 03/29/2023   Primary osteoarthritis of left knee 12/14/2022   Primary osteoarthritis of right knee 12/14/2022   Completed stroke (HCC) 09/22/2019   PVC (premature ventricular contraction) 09/22/2019   Late effect of cerebrovascular accident (CVA) 04/20/2019   CKD (chronic kidney disease) stage 3, GFR 30-59 ml/min (HCC) 03/07/2018   Peripheral edema 09/15/2017   BMI 37.0-37.9, adult 09/28/2016   Mixed hyperlipidemia 05/05/2016   Essential  hypertension, benign 03/01/2014   Hypothyroidism 03/01/2014   OA (osteoarthritis) of knee 04/25/2010    REFERRING PROVIDER: Ollen Gross MD  REFERRING DIAG: Left total knee arthroplasty.  THERAPY DIAG:  Chronic pain of left knee  Stiffness of left knee, not elsewhere classified  Localized edema  Muscle weakness (generalized)  Rationale for Evaluation and Treatment: Rehabilitation  ONSET DATE: 03/29/23 (surgery).  SUBJECTIVE:   SUBJECTIVE STATEMENT: Pt reports 4/10 left knee pain today.    PERTINENT HISTORY: Right total knee replacement. PAIN:  Are you having pain? Yes: NPRS scale: 4/10 Pain location: Left knee Pain description: Ache, sore, throbbing, sharp, numb. Aggravating factors: Moving. Relieving factors: Ice and medication.  PRECAUTIONS: Other: No ultrasound.  WEIGHT BEARING RESTRICTIONS: No  FALLS:  Has patient fallen in last 6 months? No  LIVING ENVIRONMENT: Lives in: House/apartment Stairs: One step. Has following equipment at home: Dan Humphreys - 2 wheeled  OCCUPATION: Retired.  PLOF: Independent with basic ADLs  PATIENT GOALS: decrease pain and do more.  OBJECTIVE:  Note: Objective measures were completed at Evaluation unless otherwise noted.  PATIENT SURVEYS:  FOTO 32.80  EDEMA:  Circumferential: Left 5 cms > right.  PALPATION: Diffuse anterior left knee pain.  LOWER EXTREMITY ROM:  In supine:  -20 degrees of  extension and passive to -15 degrees (within 5 degrees of right) and seated flexion to 75 degrees.       LOWER EXTREMITY ROM:     Active  Right eval Left 04/14/23  Knee flexion  110 degrees  Knee extension     LOWER EXTREMITY MMT:  Patient currently able to perform an antigravity left SLR and SAQ.  GAIT: Safe ambulation with a FWW with decreased step length.   TODAY'S TREATMENT:                                                                                                                              DATE:                             04/29/23 EXERCISE LOG  Exercise Repetitions and Resistance Comments  Nustep Lvl 4 x 15 mins   Rockerboard 3 mins        Blank cell = exercise not performed today    Manual Therapy Soft Tissue Mobilization: left knee, STW/M to medial and lateral left knee as well as distal quad to decrease pain and tone    Modalities  Date:  Unattended Estim: Knee, IFC 80-150 Hz, 15 mins, Pain Vaso: Knee, 34 degrees; low pressure, 15 mins, Pain and Edema    04/27/23:  Nustep level 3 x 15 minutes moving seat forward x 2 to increase flexion f/b STW/M x 8 minutes while receiving a sustained left knee overpressure stretch f/b LE elevation and vasopneumatic and IFC at 80-150 Hz on 40% scan x 20 minutes.  Normal modality response following removal of modality.    04/22/23:  Nustep level 3 x 15 minutes f/b SAQ's with 4# x 3 minutes f/b PROM x 8 minutes into left knee flexion and  extension with low load long duration stretching technique utilized f/b LE elevation and vasopneumatic and IFC at 80-150 Hz on 40% scan x 15 minutes.   PATIENT EDUCATION:  Education details: See below. Person educated: Patient Education method: Explanation Education comprehension: verbalized understanding  HOME EXERCISE PROGRAM: Patient to perform HEP from hospital discharge.  ASSESSMENT:  CLINICAL IMPRESSION: Pt arrives for today's treatment session reporting 4/10 left knee pain. Pt reports that her knee felt better after last treatment session.  STW/M performed to left medial and lateral knee as well as distal left quad per patient request with good results.  Normal responses to estim and vaso noted upon removal.  Pt reported decreased pain at completion of today's treatment session.   OBJECTIVE IMPAIRMENTS: Abnormal gait, decreased activity tolerance, decreased ROM, decreased strength, increased edema, and pain.   ACTIVITY LIMITATIONS: carrying, lifting, bending, stairs, and locomotion  level  PARTICIPATION LIMITATIONS: meal prep, cleaning, and laundry  PERSONAL FACTORS: Time since onset of injury/illness/exacerbation are also affecting patient's functional outcome.   REHAB POTENTIAL: Excellent  CLINICAL DECISION MAKING: Stable/uncomplicated  EVALUATION COMPLEXITY: Low   GOALS:  SHORT TERM GOALS: Target date: 04/16/23  Ind with an initial HEP Goal status: MET   LONG TERM GOALS: Target date: 07/01/23.  Ind with an initial HEP.  Goal status: IN PROGRESS  2.  Active left knee extension to -5 degrees.  Goal status: IN PROGRESS  3.  Active left knee flexion to 115 degrees+ so the patient can perform functional tasks and do so with pain not > 2-3/10.  Goal status: IN PROGRESS  4.  Increase left hip and knee strength to a solid 4+/5 to provide good stability for accomplishment of functional activities. Goal status: IN PROGRESS  5.  Perform a reciprocating stair gait with one railing with pain not > 2-3/10.  Goal status: IN PROGRESS  6.  Perform ADL's with pain not > 3/10.  Goal status: IN PROGRESS   PLAN:  PT FREQUENCY:  2-3 times a week  PT DURATION: 4 weeks  PLANNED INTERVENTIONS: 97110-Therapeutic exercises, 97530- Therapeutic activity, [id number]- Neuromuscular re-education, 97535- Self Care, 54098- Manual therapy, [id number]- Gait training, 97014- Electrical stimulation (unattended), 97016- Vasopneumatic device, Patient/Family education, Cryotherapy, and Moist heat  PLAN FOR NEXT SESSION: Nustep.  Progress per TKA protocol.  LE elevation and vasopneumatic.   Newman Pies, PTA 04/29/2023, 2:21 PM

## 2023-05-04 ENCOUNTER — Encounter: Payer: Self-pay | Admitting: Nurse Practitioner

## 2023-05-04 ENCOUNTER — Ambulatory Visit: Payer: Medicare Other | Admitting: Nurse Practitioner

## 2023-05-04 VITALS — BP 135/77 | HR 77 | Temp 97.2°F | Resp 20 | Ht 66.0 in | Wt 207.0 lb

## 2023-05-04 DIAGNOSIS — E538 Deficiency of other specified B group vitamins: Secondary | ICD-10-CM

## 2023-05-04 DIAGNOSIS — I10 Essential (primary) hypertension: Secondary | ICD-10-CM | POA: Diagnosis not present

## 2023-05-04 DIAGNOSIS — Z23 Encounter for immunization: Secondary | ICD-10-CM | POA: Diagnosis not present

## 2023-05-04 DIAGNOSIS — Z6837 Body mass index (BMI) 37.0-37.9, adult: Secondary | ICD-10-CM | POA: Diagnosis not present

## 2023-05-04 DIAGNOSIS — E034 Atrophy of thyroid (acquired): Secondary | ICD-10-CM | POA: Diagnosis not present

## 2023-05-04 DIAGNOSIS — R600 Localized edema: Secondary | ICD-10-CM

## 2023-05-04 DIAGNOSIS — E782 Mixed hyperlipidemia: Secondary | ICD-10-CM | POA: Diagnosis not present

## 2023-05-04 DIAGNOSIS — I6930 Unspecified sequelae of cerebral infarction: Secondary | ICD-10-CM

## 2023-05-04 DIAGNOSIS — N1831 Chronic kidney disease, stage 3a: Secondary | ICD-10-CM

## 2023-05-04 DIAGNOSIS — Z5189 Encounter for other specified aftercare: Secondary | ICD-10-CM | POA: Diagnosis not present

## 2023-05-04 MED ORDER — LEVOTHYROXINE SODIUM 50 MCG PO TABS
50.0000 ug | ORAL_TABLET | Freq: Every day | ORAL | 1 refills | Status: DC
Start: 2023-05-04 — End: 2023-11-02

## 2023-05-04 MED ORDER — CLOPIDOGREL BISULFATE 75 MG PO TABS: 75.0000 mg | ORAL_TABLET | Freq: Every day | ORAL | 1 refills | Status: DC

## 2023-05-04 MED ORDER — LOSARTAN POTASSIUM 25 MG PO TABS: 25.0000 mg | ORAL_TABLET | Freq: Every day | ORAL | 1 refills | Status: DC

## 2023-05-04 MED ORDER — ATORVASTATIN CALCIUM 40 MG PO TABS: 40.0000 mg | ORAL_TABLET | Freq: Every day | ORAL | 1 refills | Status: DC

## 2023-05-04 MED ORDER — FUROSEMIDE 40 MG PO TABS
40.0000 mg | ORAL_TABLET | Freq: Every day | ORAL | 1 refills | Status: DC
Start: 2023-05-04 — End: 2023-11-02

## 2023-05-04 NOTE — Progress Notes (Signed)
Subjective:    Patient ID: Kimberly Walsh, female    DOB: Feb 27, 1946, 77 y.o.   MRN: [id number]   Chief Complaint: medical management of chronic issues     HPI:  Kimberly Walsh is a 77 y.o. who identifies as a female who was assigned female at birth.   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 Essential hypertension, benign No c/o chest pain sob or headache. Doe snot check blood pressure at home. BP Readings from Last 3 Encounters:  03/30/23 (!) 118/58  03/18/23 (!) 145/69  12/15/22 (!) 149/73     2. Late effect of cerebrovascular accident (CVA) No permanent effects  3. Hypothyroidism due to acquired atrophy of thyroid No issues that she is aware of Lab Results  Component Value Date   TSH 4.010 11/02/2022     4. Mixed hyperlipidemia Does not watch diet and does no dedicated exercises Lab Results  Component Value Date   CHOL 115 11/02/2022   HDL 41 11/02/2022   LDLCALC 56 11/02/2022   TRIG 97 11/02/2022   CHOLHDL 2.8 11/02/2022     5. Stage 3a chronic kidney disease (HCC) No voiding issues Lab Results  Component Value Date   CREATININE 1.04 (H) 03/30/2023     6. Peripheral edema Has bil lower ext edema daily  7. BMI 37.0-37.9, adult Weight is down 3lbs Wt Readings from Last 3 Encounters:  05/04/23 207 lb (93.9 kg)  03/29/23 210 lb (95.3 kg)  03/18/23 210 lb (95.3 kg)   BMI Readings from Last 3 Encounters:  05/04/23 33.41 kg/m  03/29/23 33.89 kg/m  03/18/23 33.89 kg/m      New complaints: None today  Allergies  Allergen Reactions   Ace Inhibitors Cough   Outpatient Encounter Medications as of 05/04/2023  Medication Sig   acetaminophen (TYLENOL) 500 MG tablet Take 1,000 mg by mouth every 8 (eight) hours as needed for moderate pain.   atorvastatin (LIPITOR) 40 MG tablet Take 1 tablet (40 mg total) by mouth daily.    clopidogrel (PLAVIX) 75 MG tablet Take 1 tablet (75 mg total) by mouth daily.   furosemide (LASIX) 40 MG tablet Take 1 tablet (40 mg total) by mouth daily.   levothyroxine (SYNTHROID) 50 MCG tablet Take 1 tablet (50 mcg total) by mouth daily.   losartan (COZAAR) 25 MG tablet Take 1 tablet (25 mg total) by mouth daily.   meclizine (ANTIVERT) 25 MG tablet Take 1 tablet by mouth every 6 hours as need for vertigo   methocarbamol (ROBAXIN) 500 MG tablet Take 1 tablet (500 mg total) by mouth every 6 (six) hours as needed for muscle spasms.   ondansetron (ZOFRAN) 4 MG tablet Take 1 tablet (4 mg total) by mouth every 6 (six) hours as needed for nausea.   oxyCODONE (OXY IR/ROXICODONE) 5 MG immediate release tablet Take 1-2 tablets (5-10 mg total) by mouth every 6 (six) hours as needed for severe pain.   traMADol (ULTRAM) 50 MG tablet Take 1-2 tablets (50-100 mg total) by mouth every 6 (six) hours as needed for moderate pain.   Facility-Administered Encounter Medications as of 05/04/2023  Medication   cyanocobalamin (VITAMIN B12) injection 1,000 mcg    Past Surgical History:  Procedure Laterality Date   basal carcinoma rt leg     CHOLECYSTECTOMY     FINGER SURGERY Left    Ring finger  MIDDLE EAR SURGERY     Fungus   TOTAL KNEE ARTHROPLASTY Right 12/14/2022   Procedure: TOTAL KNEE ARTHROPLASTY;  Surgeon: Ollen Gross, MD;  Location: [facility];  Service: Orthopedics;  Laterality: Right;   TOTAL KNEE ARTHROPLASTY Left 03/29/2023   Procedure: LEFT TOTAL KNEE ARTHROPLASTY;  Surgeon: Ollen Gross, MD;  Location: [facility];  Service: Orthopedics;  Laterality: Left;   TUBAL LIGATION     VEIN SURGERY      Family History  Problem Relation Age of Onset   COPD Father       Controlled substance contract: n/a     Review of Systems  Constitutional:  Negative for diaphoresis.  Eyes:  Negative for pain.  Respiratory:  Negative for shortness of breath.   Cardiovascular:  Negative for chest pain,  palpitations and leg swelling.  Gastrointestinal:  Negative for abdominal pain.  Endocrine: Negative for polydipsia.  Skin:  Negative for rash.  Neurological:  Negative for dizziness, weakness and headaches.  Hematological:  Does not bruise/bleed easily.  All other systems reviewed and are negative.      Objective:   Physical Exam Vitals and nursing note reviewed.  Constitutional:      General: She is not in acute distress.    Appearance: Normal appearance. She is well-developed.  HENT:     Head: Normocephalic.     Right Ear: Tympanic membrane normal.     Left Ear: Tympanic membrane normal.     Nose: Nose normal.     Mouth/Throat:     Mouth: Mucous membranes are moist.  Eyes:     Pupils: Pupils are equal, round, and reactive to light.  Neck:     Vascular: No carotid bruit or JVD.  Cardiovascular:     Rate and Rhythm: Normal rate and regular rhythm.     Heart sounds: Normal heart sounds.  Pulmonary:     Effort: Pulmonary effort is normal. No respiratory distress.     Breath sounds: Normal breath sounds. No wheezing or rales.  Chest:     Chest wall: No tenderness.  Abdominal:     General: Bowel sounds are normal. There is no distension or abdominal bruit.     Palpations: Abdomen is soft. There is no hepatomegaly, splenomegaly, mass or pulsatile mass.     Tenderness: There is no abdominal tenderness.  Musculoskeletal:        General: Normal range of motion.     Cervical back: Normal range of motion and neck supple.     Right lower leg: Edema (1+) present.     Left lower leg: No edema.  Lymphadenopathy:     Cervical: No cervical adenopathy.  Skin:    General: Skin is warm and dry.  Neurological:     Mental Status: She is alert and oriented to person, place, and time.     Deep Tendon Reflexes: Reflexes are normal and symmetric.  Psychiatric:        Behavior: Behavior normal.        Thought Content: Thought content normal.        Judgment: Judgment normal.     BP  135/77   Pulse 77   Temp (!) 97.2 F (36.2 C) (Temporal)   Resp 20   Ht 5\' 6"  (1.676 m)   Wt 207 lb (93.9 kg)   SpO2 97%   BMI 33.41 kg/m        Assessment & Plan:   Kimberly Walsh comes in today with chief complaint  of Medical Management of Chronic Issues   Diagnosis and orders addressed:  1. Essential hypertension, benign Low sodium diet - losartan (COZAAR) 25 MG tablet; Take 1 tablet (25 mg total) by mouth daily.  Dispense: 90 tablet; Refill: 1 - CBC with Differential/Platelet - CMP14+EGFR  2. Late effect of cerebrovascular accident (CVA) No permanent effects - clopidogrel (PLAVIX) 75 MG tablet; Take 1 tablet (75 mg total) by mouth daily.  Dispense: 90 tablet; Refill: 1  3. Hypothyroidism due to acquired atrophy of thyroid Labs pending - levothyroxine (SYNTHROID) 50 MCG tablet; Take 1 tablet (50 mcg total) by mouth daily.  Dispense: 90 tablet; Refill: 1 - Thyroid Panel With TSH  4. Mixed hyperlipidemia Low fat diet - atorvastatin (LIPITOR) 40 MG tablet; Take 1 tablet (40 mg total) by mouth daily.  Dispense: 90 tablet; Refill: 1 - Lipid panel  5. Stage 3a chronic kidney disease (HCC) Labs  pending  6. Peripheral edema Elevate legs when sitting - furosemide (LASIX) 40 MG tablet; Take 1 tablet (40 mg total) by mouth daily.  Dispense: 90 tablet; Refill: 1  7. BMI 37.0-37.9, adult Discussed diet and exercise for person with BMI >25 Will recheck weight in 3-6 months    Labs pending Health Maintenance reviewed Diet and exercise encouraged  Follow up plan: 6 months   Kimberly Daphine Deutscher, FNP

## 2023-05-05 DIAGNOSIS — I3481 Nonrheumatic mitral (valve) annulus calcification: Secondary | ICD-10-CM | POA: Diagnosis not present

## 2023-05-05 LAB — CBC WITH DIFFERENTIAL/PLATELET
Basophils Absolute: 0 10*3/uL (ref 0.0–0.2)
Basos: 1 %
EOS (ABSOLUTE): 0.3 10*3/uL (ref 0.0–0.4)
Eos: 4 %
Hematocrit: 41.5 % (ref 34.0–46.6)
Hemoglobin: 13.2 g/dL (ref 11.1–15.9)
Immature Grans (Abs): 0 10*3/uL (ref 0.0–0.1)
Immature Granulocytes: 0 %
Lymphocytes Absolute: 1.7 10*3/uL (ref 0.7–3.1)
Lymphs: 26 %
MCH: 28.5 pg (ref 26.6–33.0)
MCHC: 31.8 g/dL (ref 31.5–35.7)
MCV: 90 fL (ref 79–97)
Monocytes Absolute: 0.5 10*3/uL (ref 0.1–0.9)
Monocytes: 7 %
Neutrophils Absolute: 4.2 10*3/uL (ref 1.4–7.0)
Neutrophils: 62 %
Platelets: 152 10*3/uL (ref 150–450)
RBC: 4.63 x10E6/uL (ref 3.77–5.28)
RDW: 14.4 % (ref 11.7–15.4)
WBC: 6.7 10*3/uL (ref 3.4–10.8)

## 2023-05-05 LAB — CMP14+EGFR
ALT: 11 [IU]/L (ref 0–32)
AST: 18 IU/L (ref 0–40)
Albumin: 4 g/dL (ref 3.8–4.8)
Alkaline Phosphatase: 144 IU/L — ABNORMAL HIGH (ref 44–121)
BUN/Creatinine Ratio: 10 — ABNORMAL LOW (ref 12–28)
BUN: 10 mg/dL (ref 8–27)
Bilirubin Total: 0.6 mg/dL (ref 0.0–1.2)
CO2: 25 mmol/L (ref 20–29)
Calcium: 9.6 mg/dL (ref 8.7–10.3)
Chloride: 102 mmol/L (ref 96–106)
Creatinine, Ser: 0.99 mg/dL (ref 0.57–1.00)
Globulin, Total: 3 g/dL (ref 1.5–4.5)
Glucose: 82 mg/dL (ref 70–99)
Potassium: 4 mmol/L (ref 3.5–5.2)
Sodium: 141 mmol/L (ref 134–144)
Total Protein: 7 g/dL (ref 6.0–8.5)
eGFR: 59 mL/min/{1.73_m2} — ABNORMAL LOW (ref >59)

## 2023-05-05 LAB — THYROID PANEL WITH TSH
Free Thyroxine Index: 2.1 (ref 1.2–4.9)
T3 Uptake Ratio: 29 % (ref 24–39)
T4, Total: 7.1 ug/dL (ref 4.5–12.0)
TSH: 2.14 u[IU]/mL (ref 0.450–4.500)

## 2023-05-05 LAB — LIPID PANEL
Chol/HDL Ratio: 3 ratio (ref 0.0–4.4)
Cholesterol, Total: 115 mg/dL (ref 100–199)
HDL: 38 mg/dL — ABNORMAL LOW (ref >39)
LDL Chol Calc (NIH): 54 mg/dL (ref 0–99)
Triglycerides: 129 mg/dL (ref 0–149)
VLDL Cholesterol Cal: 23 mg/dL (ref 5–40)

## 2023-05-06 ENCOUNTER — Ambulatory Visit: Payer: Medicare Other | Admitting: Physical Therapy

## 2023-05-06 DIAGNOSIS — M6281 Muscle weakness (generalized): Secondary | ICD-10-CM | POA: Diagnosis not present

## 2023-05-06 DIAGNOSIS — M25562 Pain in left knee: Secondary | ICD-10-CM | POA: Diagnosis not present

## 2023-05-06 DIAGNOSIS — G8929 Other chronic pain: Secondary | ICD-10-CM | POA: Diagnosis not present

## 2023-05-06 DIAGNOSIS — R600 Localized edema: Secondary | ICD-10-CM

## 2023-05-06 DIAGNOSIS — M25662 Stiffness of left knee, not elsewhere classified: Secondary | ICD-10-CM | POA: Diagnosis not present

## 2023-05-06 NOTE — Therapy (Signed)
OUTPATIENT PHYSICAL THERAPY LOWER EXTREMITY TREATMENT   Patient Name: Kimberly Walsh MRN: [id number] DOB:09-Oct-1945, 77 y.o., female Today's Date: 05/06/2023  END OF SESSION:  PT End of Session - 05/06/23 1304     Visit Number 8    Number of Visits 12    Date for PT Re-Evaluation 07/01/23    Authorization Type FOTO.    PT Start Time 0100    PT Stop Time 0159    PT Time Calculation (min) 59 min    Activity Tolerance Patient tolerated treatment well    Behavior During Therapy WFL for tasks assessed/performed               Past Medical History:  Diagnosis Date   Arthritis    Cancer (HCC) 2016   skin cancer   Hyperlipidemia    Hypertension    Hypothyroidism    PONV (postoperative nausea and vomiting)    Stroke (HCC)    per pt. stroke was ruled out still on plavix   Venous stasis    Vertigo    Past Surgical History:  Procedure Laterality Date   basal carcinoma rt leg     CHOLECYSTECTOMY     FINGER SURGERY Left    Ring finger   MIDDLE EAR SURGERY     Fungus   TOTAL KNEE ARTHROPLASTY Right 12/14/2022   Procedure: TOTAL KNEE ARTHROPLASTY;  Surgeon: Ollen Gross, MD;  Location: [facility];  Service: Orthopedics;  Laterality: Right;   TOTAL KNEE ARTHROPLASTY Left 03/29/2023   Procedure: LEFT TOTAL KNEE ARTHROPLASTY;  Surgeon: Ollen Gross, MD;  Location: [facility];  Service: Orthopedics;  Laterality: Left;   TUBAL LIGATION     VEIN SURGERY     Patient Active Problem List   Diagnosis Date Noted   Osteoarthritis of left knee 03/29/2023   Primary osteoarthritis of left knee 12/14/2022   Primary osteoarthritis of right knee 12/14/2022   PVC (premature ventricular contraction) 09/22/2019   Late effect of cerebrovascular accident (CVA) 04/20/2019   CKD (chronic kidney disease) stage 3, GFR 30-59 ml/min (HCC) 03/07/2018   Peripheral edema 09/15/2017   BMI 37.0-37.9, adult 09/28/2016   Mixed hyperlipidemia 05/05/2016   Essential hypertension, benign 03/01/2014    Hypothyroidism 03/01/2014   OA (osteoarthritis) of knee 04/25/2010    REFERRING PROVIDER: Ollen Gross MD  REFERRING DIAG: Left total knee arthroplasty.  THERAPY DIAG:  Chronic pain of left knee  Stiffness of left knee, not elsewhere classified  Localized edema  Rationale for Evaluation and Treatment: Rehabilitation  ONSET DATE: 03/29/23 (surgery).  SUBJECTIVE:   SUBJECTIVE STATEMENT: Pain about a 3.  Doctor thinks the pain I am experiencing in nerve pain.  Recommended putting Voltaren on it.  PERTINENT HISTORY: Right total knee replacement. PAIN:  Are you having pain? Yes: NPRS scale: 4/10 Pain location: Left knee Pain description: Ache, sore, throbbing, sharp, numb. Aggravating factors: Moving. Relieving factors: Ice and medication.  PRECAUTIONS: Other: No ultrasound.  WEIGHT BEARING RESTRICTIONS: No  FALLS:  Has patient fallen in last 6 months? No  LIVING ENVIRONMENT: Lives in: House/apartment Stairs: One step. Has following equipment at home: Dan Humphreys - 2 wheeled  OCCUPATION: Retired.  PLOF: Independent with basic ADLs  PATIENT GOALS: decrease pain and do more.  OBJECTIVE:  Note: Objective measures were completed at Evaluation unless otherwise noted.  PATIENT SURVEYS:  FOTO 32.80  EDEMA:  Circumferential: Left 5 cms > right.  PALPATION: Diffuse anterior left knee pain.  LOWER EXTREMITY ROM:  In supine:  -20  degrees of extension and passive to -15 degrees (within 5 degrees of right) and seated flexion to 75 degrees.       LOWER EXTREMITY ROM:     Active  Right eval Left 04/14/23   Knee flexion  110 degrees   Knee extension      LOWER EXTREMITY MMT:  Patient currently able to perform an antigravity left SLR and SAQ.  GAIT: Safe ambulation with a FWW with decreased step length.   TODAY'S TREATMENT:                                                                                                                              DATE:  05/06/23:                                     EXERCISE LOG  Exercise Repetitions and Resistance Comments  Nustep Level 3 x 15 minutes   Knee ext 10# x 3 minutes.   Ham curls 30# x 3 minutes.           In supine:  PROM into left knee flexion and extension x 10 minutes f/b LE elevation and vasopneumatic and IFC at 80-150 Hz on 40% scan x 20 minutes to patient's left knee.  Normal modality response following removal of modality.                             04/29/23 EXERCISE LOG  Exercise Repetitions and Resistance Comments  Nustep Lvl 4 x 15 mins   Rockerboard 3 mins        Blank cell = exercise not performed today    Manual Therapy Soft Tissue Mobilization: left knee, STW/M to medial and lateral left knee as well as distal quad to decrease pain and tone    Modalities  Date:  Unattended Estim: Knee, IFC 80-150 Hz, 15 mins, Pain Vaso: Knee, 34 degrees; low pressure, 15 mins, Pain and Edema    04/27/23:  Nustep level 3 x 15 minutes moving seat forward x 2 to increase flexion f/b STW/M x 8 minutes while receiving a sustained left knee overpressure stretch f/b LE elevation and vasopneumatic and IFC at 80-150 Hz on 40% scan x 20 minutes.  Normal modality response following removal of modality.   PATIENT EDUCATION:  Education details: See below. Person educated: Patient Education method: Explanation Education comprehension: verbalized understanding  HOME EXERCISE PROGRAM: Patient to perform HEP from hospital discharge.  ASSESSMENT:  CLINICAL IMPRESSION: Patient did very well with the addition of weight machines today.  She achieved left knee passive flexion to 115 degrees today.    OBJECTIVE IMPAIRMENTS: Abnormal gait, decreased activity tolerance, decreased ROM, decreased strength, increased edema, and pain.   ACTIVITY LIMITATIONS: carrying, lifting, bending, stairs, and locomotion level  PARTICIPATION LIMITATIONS: meal prep, cleaning, and laundry  PERSONAL FACTORS:  Time since onset of injury/illness/exacerbation are also affecting patient's functional outcome.   REHAB POTENTIAL: Excellent  CLINICAL DECISION MAKING: Stable/uncomplicated  EVALUATION COMPLEXITY: Low   GOALS:  SHORT TERM GOALS: Target date: 04/16/23  Ind with an initial HEP Goal status: MET   LONG TERM GOALS: Target date: 07/01/23.  Ind with an initial HEP.  Goal status: IN PROGRESS  2.  Active left knee extension to -5 degrees.  Goal status: IN PROGRESS  3.  Active left knee flexion to 115 degrees+ so the patient can perform functional tasks and do so with pain not > 2-3/10.  Goal status: IN PROGRESS  4.  Increase left hip and knee strength to a solid 4+/5 to provide good stability for accomplishment of functional activities. Goal status: IN PROGRESS  5.  Perform a reciprocating stair gait with one railing with pain not > 2-3/10.  Goal status: IN PROGRESS  6.  Perform ADL's with pain not > 3/10.  Goal status: IN PROGRESS   PLAN:  PT FREQUENCY:  2-3 times a week  PT DURATION: 4 weeks  PLANNED INTERVENTIONS: 97110-Therapeutic exercises, 97530- Therapeutic activity, [id number]- Neuromuscular re-education, 97535- Self Care, [id number]- Manual therapy, [id number]- Gait training, 97014- Electrical stimulation (unattended), 97016- Vasopneumatic device, Patient/Family education, Cryotherapy, and Moist heat  PLAN FOR NEXT SESSION: Nustep.  Progress per TKA protocol.  LE elevation and vasopneumatic.   Calvina Liptak, [place], PT 05/06/2023, 2:16 PM

## 2023-05-11 ENCOUNTER — Ambulatory Visit: Payer: Medicare Other | Admitting: Physical Therapy

## 2023-05-11 ENCOUNTER — Encounter: Payer: Self-pay | Admitting: Physical Therapy

## 2023-05-11 DIAGNOSIS — G8929 Other chronic pain: Secondary | ICD-10-CM

## 2023-05-11 DIAGNOSIS — M25662 Stiffness of left knee, not elsewhere classified: Secondary | ICD-10-CM | POA: Diagnosis not present

## 2023-05-11 DIAGNOSIS — M25562 Pain in left knee: Secondary | ICD-10-CM | POA: Diagnosis not present

## 2023-05-11 DIAGNOSIS — R600 Localized edema: Secondary | ICD-10-CM | POA: Diagnosis not present

## 2023-05-11 DIAGNOSIS — M6281 Muscle weakness (generalized): Secondary | ICD-10-CM | POA: Diagnosis not present

## 2023-05-11 NOTE — Therapy (Signed)
OUTPATIENT PHYSICAL THERAPY LOWER EXTREMITY TREATMENT   Patient Name: Kimberly Walsh MRN: [id number] DOB:July 05, 1945, 77 y.o., female Today's Date: 05/11/2023  END OF SESSION:  PT End of Session - 05/11/23 1311     Visit Number 9    Number of Visits 12    Date for PT Re-Evaluation 07/01/23    Authorization Type FOTO.    PT Start Time 0100    PT Stop Time 0156    PT Time Calculation (min) 56 min    Activity Tolerance Patient tolerated treatment well    Behavior During Therapy WFL for tasks assessed/performed                Past Medical History:  Diagnosis Date   Arthritis    Cancer (HCC) 2016   skin cancer   Hyperlipidemia    Hypertension    Hypothyroidism    PONV (postoperative nausea and vomiting)    Stroke (HCC)    per pt. stroke was ruled out still on plavix   Venous stasis    Vertigo    Past Surgical History:  Procedure Laterality Date   basal carcinoma rt leg     CHOLECYSTECTOMY     FINGER SURGERY Left    Ring finger   MIDDLE EAR SURGERY     Fungus   TOTAL KNEE ARTHROPLASTY Right 12/14/2022   Procedure: TOTAL KNEE ARTHROPLASTY;  Surgeon: Ollen Gross, MD;  Location: [facility];  Service: Orthopedics;  Laterality: Right;   TOTAL KNEE ARTHROPLASTY Left 03/29/2023   Procedure: LEFT TOTAL KNEE ARTHROPLASTY;  Surgeon: Ollen Gross, MD;  Location: [facility];  Service: Orthopedics;  Laterality: Left;   TUBAL LIGATION     VEIN SURGERY     Patient Active Problem List   Diagnosis Date Noted   Osteoarthritis of left knee 03/29/2023   Primary osteoarthritis of left knee 12/14/2022   Primary osteoarthritis of right knee 12/14/2022   PVC (premature ventricular contraction) 09/22/2019   Late effect of cerebrovascular accident (CVA) 04/20/2019   CKD (chronic kidney disease) stage 3, GFR 30-59 ml/min (HCC) 03/07/2018   Peripheral edema 09/15/2017   BMI 37.0-37.9, adult 09/28/2016   Mixed hyperlipidemia 05/05/2016   Essential hypertension, benign 03/01/2014    Hypothyroidism 03/01/2014   OA (osteoarthritis) of knee 04/25/2010    REFERRING PROVIDER: Ollen Gross MD  REFERRING DIAG: Left total knee arthroplasty.  THERAPY DIAG:  Chronic pain of left knee  Stiffness of left knee, not elsewhere classified  Localized edema  Rationale for Evaluation and Treatment: Rehabilitation  ONSET DATE: 03/29/23 (surgery).  SUBJECTIVE:   SUBJECTIVE STATEMENT: Doing better.  PERTINENT HISTORY: Right total knee replacement. PAIN:  Are you having pain? Yes: NPRS scale: 2/10 Pain location: Left knee Pain description: Ache, sore, throbbing, sharp, numb. Aggravating factors: Moving. Relieving factors: Ice and medication.  PRECAUTIONS: Other: No ultrasound.  WEIGHT BEARING RESTRICTIONS: No  FALLS:  Has patient fallen in last 6 months? No  LIVING ENVIRONMENT: Lives in: House/apartment Stairs: One step. Has following equipment at home: Dan Humphreys - 2 wheeled  OCCUPATION: Retired.  PLOF: Independent with basic ADLs  PATIENT GOALS: decrease pain and do more.  OBJECTIVE:  Note: Objective measures were completed at Evaluation unless otherwise noted.  PATIENT SURVEYS:  FOTO 32.80  EDEMA:  Circumferential: Left 5 cms > right.  PALPATION: Diffuse anterior left knee pain.  LOWER EXTREMITY ROM:  In supine:  -20 degrees of extension and passive to -15 degrees (within 5 degrees of right) and seated flexion to 75  degrees.       LOWER EXTREMITY ROM:     Active  Right eval Left 04/14/23   Knee flexion  110 degrees   Knee extension      LOWER EXTREMITY MMT:  Patient currently able to perform an antigravity left SLR and SAQ.  GAIT: Safe ambulation with a FWW with decreased step length.   TODAY'S TREATMENT:                                                                                                                              DATE:  05/11/23:                                       EXERCISE LOG  Exercise Repetitions and  Resistance Comments  Nustep Level 3 x 15 minutes   Knee ext 10# x 3 minutes   Ham curls 30# x 3 minutes           LE elevation and vasopneumatic and IFC at 80-150 Hz on 40% scan x 20 minutes to patient's left knee.  Normal modality response following removal of modality.    05/06/23:                                     EXERCISE LOG  Exercise Repetitions and Resistance Comments  Nustep Level 3 x 15 minutes   Knee ext 10# x 3 minutes.   Ham curls 30# x 3 minutes.           In supine:  PROM into left knee flexion and extension x 10 minutes f/b LE elevation and vasopneumatic and IFC at 80-150 Hz on 40% scan x 20 minutes to patient's left knee.  Normal modality response following removal of modality.                             04/29/23 EXERCISE LOG  Exercise Repetitions and Resistance Comments  Nustep Lvl 4 x 15 mins   Rockerboard 3 mins        Blank cell = exercise not performed today    Manual Therapy Soft Tissue Mobilization: left knee, STW/M to medial and lateral left knee as well as distal quad to decrease pain and tone    Modalities  Date:  Unattended Estim: Knee, IFC 80-150 Hz, 15 mins, Pain Vaso: Knee, 34 degrees; low pressure, 15 mins, Pain and Edema    04/27/23:  Nustep level 3 x 15 minutes moving seat forward x 2 to increase flexion f/b STW/M x 8 minutes while receiving a sustained left knee overpressure stretch f/b LE elevation and vasopneumatic and IFC at 80-150 Hz on 40% scan x 20 minutes.  Normal modality response following removal of modality.   PATIENT EDUCATION:  Education details: See below. Person educated: Patient Education method: Explanation Education comprehension: verbalized understanding  HOME EXERCISE PROGRAM: Patient to perform HEP from hospital discharge.  ASSESSMENT:  CLINICAL IMPRESSION: Patent did very well with the addition of the leg press machine.  Her FOTO limitation score has improved to 59.  OBJECTIVE IMPAIRMENTS: Abnormal  gait, decreased activity tolerance, decreased ROM, decreased strength, increased edema, and pain.   ACTIVITY LIMITATIONS: carrying, lifting, bending, stairs, and locomotion level  PARTICIPATION LIMITATIONS: meal prep, cleaning, and laundry  PERSONAL FACTORS: Time since onset of injury/illness/exacerbation are also affecting patient's functional outcome.   REHAB POTENTIAL: Excellent  CLINICAL DECISION MAKING: Stable/uncomplicated  EVALUATION COMPLEXITY: Low   GOALS:  SHORT TERM GOALS: Target date: 04/16/23  Ind with an initial HEP Goal status: MET   LONG TERM GOALS: Target date: 07/01/23.  Ind with an initial HEP.  Goal status: IN PROGRESS  2.  Active left knee extension to -5 degrees.  Goal status: IN PROGRESS  3.  Active left knee flexion to 115 degrees+ so the patient can perform functional tasks and do so with pain not > 2-3/10.  Goal status: IN PROGRESS  4.  Increase left hip and knee strength to a solid 4+/5 to provide good stability for accomplishment of functional activities. Goal status: IN PROGRESS  5.  Perform a reciprocating stair gait with one railing with pain not > 2-3/10.  Goal status: IN PROGRESS  6.  Perform ADL's with pain not > 3/10.  Goal status: IN PROGRESS   PLAN:  PT FREQUENCY:  2-3 times a week  PT DURATION: 4 weeks  PLANNED INTERVENTIONS: 97110-Therapeutic exercises, 97530- Therapeutic activity, [id number]- Neuromuscular re-education, 97535- Self Care, 16109- Manual therapy, [id number]- Gait training, 97014- Electrical stimulation (unattended), 97016- Vasopneumatic device, Patient/Family education, Cryotherapy, and Moist heat  PLAN FOR NEXT SESSION: Nustep.  Progress per TKA protocol.  LE elevation and vasopneumatic.   Antasia Haider, [place], PT 05/11/2023, 2:11 PM

## 2023-05-13 ENCOUNTER — Ambulatory Visit: Payer: Medicare Other

## 2023-05-13 DIAGNOSIS — G8929 Other chronic pain: Secondary | ICD-10-CM | POA: Diagnosis not present

## 2023-05-13 DIAGNOSIS — M25662 Stiffness of left knee, not elsewhere classified: Secondary | ICD-10-CM

## 2023-05-13 DIAGNOSIS — M25562 Pain in left knee: Secondary | ICD-10-CM | POA: Diagnosis not present

## 2023-05-13 DIAGNOSIS — M6281 Muscle weakness (generalized): Secondary | ICD-10-CM

## 2023-05-13 DIAGNOSIS — R600 Localized edema: Secondary | ICD-10-CM

## 2023-05-13 NOTE — Therapy (Signed)
OUTPATIENT PHYSICAL THERAPY LOWER EXTREMITY TREATMENT   Patient Name: Kimberly Walsh MRN: [id number] DOB:1945/12/17, 77 y.o., female Today's Date: 05/13/2023  END OF SESSION:  PT End of Session - 05/13/23 1442     Visit Number 10    Number of Visits 12    Date for PT Re-Evaluation 07/01/23    Authorization Type FOTO.    PT Start Time 1430    PT Stop Time 1530    PT Time Calculation (min) 60 min    Activity Tolerance Patient tolerated treatment well    Behavior During Therapy WFL for tasks assessed/performed                 Past Medical History:  Diagnosis Date   Arthritis    Cancer (HCC) 2016   skin cancer   Hyperlipidemia    Hypertension    Hypothyroidism    PONV (postoperative nausea and vomiting)    Stroke (HCC)    per pt. stroke was ruled out still on plavix   Venous stasis    Vertigo    Past Surgical History:  Procedure Laterality Date   basal carcinoma rt leg     CHOLECYSTECTOMY     FINGER SURGERY Left    Ring finger   MIDDLE EAR SURGERY     Fungus   TOTAL KNEE ARTHROPLASTY Right 12/14/2022   Procedure: TOTAL KNEE ARTHROPLASTY;  Surgeon: Ollen Gross, MD;  Location: [facility];  Service: Orthopedics;  Laterality: Right;   TOTAL KNEE ARTHROPLASTY Left 03/29/2023   Procedure: LEFT TOTAL KNEE ARTHROPLASTY;  Surgeon: Ollen Gross, MD;  Location: [facility];  Service: Orthopedics;  Laterality: Left;   TUBAL LIGATION     VEIN SURGERY     Patient Active Problem List   Diagnosis Date Noted   Osteoarthritis of left knee 03/29/2023   Primary osteoarthritis of left knee 12/14/2022   Primary osteoarthritis of right knee 12/14/2022   PVC (premature ventricular contraction) 09/22/2019   Late effect of cerebrovascular accident (CVA) 04/20/2019   CKD (chronic kidney disease) stage 3, GFR 30-59 ml/min (HCC) 03/07/2018   Peripheral edema 09/15/2017   BMI 37.0-37.9, adult 09/28/2016   Mixed hyperlipidemia 05/05/2016   Essential hypertension, benign 03/01/2014    Hypothyroidism 03/01/2014   OA (osteoarthritis) of knee 04/25/2010    REFERRING PROVIDER: Ollen Gross MD  REFERRING DIAG: Left total knee arthroplasty.  THERAPY DIAG:  Chronic pain of left knee  Stiffness of left knee, not elsewhere classified  Localized edema  Muscle weakness (generalized)  Rationale for Evaluation and Treatment: Rehabilitation  ONSET DATE: 03/29/23 (surgery).  SUBJECTIVE:   SUBJECTIVE STATEMENT: Patient reports that she feels alright today.   PERTINENT HISTORY: Right total knee replacement. PAIN:  Are you having pain? Yes: NPRS scale: 2/10 Pain location: Left knee Pain description: Ache, sore, throbbing, sharp, numb. Aggravating factors: Moving. Relieving factors: Ice and medication.  PRECAUTIONS: Other: No ultrasound.  WEIGHT BEARING RESTRICTIONS: No  FALLS:  Has patient fallen in last 6 months? No  LIVING ENVIRONMENT: Lives in: House/apartment Stairs: One step. Has following equipment at home: Dan Humphreys - 2 wheeled  OCCUPATION: Retired.  PLOF: Independent with basic ADLs  PATIENT GOALS: decrease pain and do more.  OBJECTIVE:  Note: Objective measures were completed at Evaluation unless otherwise noted.  PATIENT SURVEYS:  FOTO 41.57 on 05/13/23  EDEMA:  Circumferential: Left 5 cms > right.  PALPATION: Diffuse anterior left knee pain.  LOWER EXTREMITY ROM:     Active  Left 04/14/23 Left 05/13/23  Right 05/13/23  Hip flexion     Hip extension     Hip abduction     Hip adduction     Hip internal rotation     Hip external rotation     Knee flexion 110 degrees 106 105  Knee extension  7 17  Ankle dorsiflexion     Ankle plantarflexion     Ankle inversion     Ankle eversion      (Blank rows = not tested)   LOWER EXTREMITY MMT:    MMT Right 05/13/23 Left 05/13/23  Hip flexion    Hip extension    Hip abduction    Hip adduction    Hip internal rotation    Hip external rotation    Knee flexion 4/5 4/5  Knee extension  4/5; sore 4-/5  Ankle dorsiflexion    Ankle plantarflexion    Ankle inversion    Ankle eversion     (Blank rows = not tested)   GAIT: Safe ambulation with a FWW with decreased step length.   TODAY'S TREATMENT:                                                                                                                              DATE:                                    05/13/23 EXERCISE LOG  Exercise Repetitions and Resistance Comments  Nustep  L4 x 16 minutes; seat 9   Cybex knee flexion 30# x 3 minutes   Cybex knee extension 10# x 3 minutes            Blank cell = exercise not performed today  Modalities: no redness or adverse reaction to today's modalities  Date:  Unattended Estim: left knee, pre mod @ 80-150 Hz, 15 mins, Pain Vaso: Knee, 34 degrees; low pressure, 15 mins, Pain  05/11/23:  EXERCISE LOG  Exercise Repetitions and Resistance Comments  Nustep Level 3 x 15 minutes   Knee ext 10# x 3 minutes   Ham curls 30# x 3 minutes           LE elevation and vasopneumatic and IFC at 80-150 Hz on 40% scan x 20 minutes to patient's left knee.  Normal modality response following removal of modality.    05/06/23: EXERCISE LOG  Exercise Repetitions and Resistance Comments  Nustep Level 3 x 15 minutes   Knee ext 10# x 3 minutes.   Ham curls 30# x 3 minutes.           In supine:  PROM into left knee flexion and extension x 10 minutes f/b LE elevation and vasopneumatic and IFC at 80-150 Hz on 40% scan x 20 minutes to patient's left knee.  Normal modality response following removal of modality.  PATIENT EDUCATION:  Education details: objective measures, knee mobility,  and plan of care Person educated: Patient Education method: Explanation Education comprehension: verbalized understanding  HOME EXERCISE PROGRAM: Patient to perform HEP from hospital discharge.  ASSESSMENT:  CLINICAL IMPRESSION: Patient is making good progress with skilled physical therapy as  evidenced by her objective measures, functional mobility, and progress toward her goals. She was able to demonstrate improved left knee mobility since her initial evaluation on 04/02/23. However, she has yet to meet her long term goals for improved active range of motion and functional mobility secondary to pain and stiffness. Today's interventions focused on familiar interventions for improved knee strength needed for improved functional mobility. She was educated on her progress with therapy and the expectations for healing. She reported understanding. She reported that her knee felt better upon the conclusion of treatment. She continues to require skilled physical therapy to address her remaining impairments to maximize her functional mobility.    OBJECTIVE IMPAIRMENTS: Abnormal gait, decreased activity tolerance, decreased ROM, decreased strength, increased edema, and pain.   ACTIVITY LIMITATIONS: carrying, lifting, bending, stairs, and locomotion level  PARTICIPATION LIMITATIONS: meal prep, cleaning, and laundry  PERSONAL FACTORS: Time since onset of injury/illness/exacerbation are also affecting patient's functional outcome.   REHAB POTENTIAL: Excellent  CLINICAL DECISION MAKING: Stable/uncomplicated  EVALUATION COMPLEXITY: Low   GOALS:  SHORT TERM GOALS: Target date: 04/16/23  Ind with an initial HEP Goal status: MET   LONG TERM GOALS: Target date: 07/01/23.  Ind with an initial HEP.  Goal status: MET  2.  Active left knee extension to -5 degrees.   Baseline: 7 degrees from neutral  Goal status: IN PROGRESS  3.  Active left knee flexion to 115 degrees+ so the patient can perform functional tasks and do so with pain not > 2-3/10.   Baseline: 106 degrees Goal status: IN PROGRESS  4.  Increase left hip and knee strength to a solid 4+/5 to provide good stability for accomplishment of functional activities.  Baseline: see objective Goal status: IN PROGRESS  5.  Perform a  reciprocating stair gait with one railing with pain not > 2-3/10.  Goal status: IN PROGRESS  6.  Perform ADL's with pain not > 3/10.   Baseline: 3-4/10 Goal status: IN PROGRESS   PLAN:  PT FREQUENCY:  2-3 times a week  PT DURATION: 4 weeks  PLANNED INTERVENTIONS: 97110-Therapeutic exercises, 97530- Therapeutic activity, [id number]- Neuromuscular re-education, 97535- Self Care, [id number]- Manual therapy, [id number]- Gait training, 97014- Electrical stimulation (unattended), 97016- Vasopneumatic device, Patient/Family education, Cryotherapy, and Moist heat  PLAN FOR NEXT SESSION: Nustep.  Progress per TKA protocol.  LE elevation and vasopneumatic. Send re certification to include modalities and treatment for the right   Granville Lewis, PT 05/13/2023, 3:41 PM

## 2023-05-17 DIAGNOSIS — L814 Other melanin hyperpigmentation: Secondary | ICD-10-CM | POA: Diagnosis not present

## 2023-05-17 DIAGNOSIS — I781 Nevus, non-neoplastic: Secondary | ICD-10-CM | POA: Diagnosis not present

## 2023-05-17 DIAGNOSIS — L821 Other seborrheic keratosis: Secondary | ICD-10-CM | POA: Diagnosis not present

## 2023-05-17 DIAGNOSIS — Z85828 Personal history of other malignant neoplasm of skin: Secondary | ICD-10-CM | POA: Diagnosis not present

## 2023-05-17 DIAGNOSIS — L570 Actinic keratosis: Secondary | ICD-10-CM | POA: Diagnosis not present

## 2023-05-17 DIAGNOSIS — L853 Xerosis cutis: Secondary | ICD-10-CM | POA: Diagnosis not present

## 2023-05-18 ENCOUNTER — Ambulatory Visit: Payer: Medicare Other | Admitting: Physical Therapy

## 2023-05-18 DIAGNOSIS — G8929 Other chronic pain: Secondary | ICD-10-CM | POA: Diagnosis not present

## 2023-05-18 DIAGNOSIS — M25562 Pain in left knee: Secondary | ICD-10-CM | POA: Diagnosis not present

## 2023-05-18 DIAGNOSIS — R600 Localized edema: Secondary | ICD-10-CM | POA: Diagnosis not present

## 2023-05-18 DIAGNOSIS — M25662 Stiffness of left knee, not elsewhere classified: Secondary | ICD-10-CM

## 2023-05-18 DIAGNOSIS — M6281 Muscle weakness (generalized): Secondary | ICD-10-CM | POA: Diagnosis not present

## 2023-05-18 NOTE — Therapy (Signed)
OUTPATIENT PHYSICAL THERAPY LOWER EXTREMITY TREATMENT   Patient Name: Kimberly Walsh MRN: [id number] DOB:1946/03/18, 77 y.o., female Today's Date: 05/18/2023  END OF SESSION:  PT End of Session - 05/18/23 1312     Visit Number 11    Number of Visits 12    Date for PT Re-Evaluation 07/01/23    Authorization Type FOTO.    PT Start Time 0100    PT Stop Time 0155    PT Time Calculation (min) 55 min    Activity Tolerance Patient tolerated treatment well    Behavior During Therapy WFL for tasks assessed/performed                 Past Medical History:  Diagnosis Date   Arthritis    Cancer (HCC) 2016   skin cancer   Hyperlipidemia    Hypertension    Hypothyroidism    PONV (postoperative nausea and vomiting)    Stroke (HCC)    per pt. stroke was ruled out still on plavix   Venous stasis    Vertigo    Past Surgical History:  Procedure Laterality Date   basal carcinoma rt leg     CHOLECYSTECTOMY     FINGER SURGERY Left    Ring finger   MIDDLE EAR SURGERY     Fungus   TOTAL KNEE ARTHROPLASTY Right 12/14/2022   Procedure: TOTAL KNEE ARTHROPLASTY;  Surgeon: Ollen Gross, MD;  Location: [facility];  Service: Orthopedics;  Laterality: Right;   TOTAL KNEE ARTHROPLASTY Left 03/29/2023   Procedure: LEFT TOTAL KNEE ARTHROPLASTY;  Surgeon: Ollen Gross, MD;  Location: [facility];  Service: Orthopedics;  Laterality: Left;   TUBAL LIGATION     VEIN SURGERY     Patient Active Problem List   Diagnosis Date Noted   Osteoarthritis of left knee 03/29/2023   Primary osteoarthritis of left knee 12/14/2022   Primary osteoarthritis of right knee 12/14/2022   PVC (premature ventricular contraction) 09/22/2019   Late effect of cerebrovascular accident (CVA) 04/20/2019   CKD (chronic kidney disease) stage 3, GFR 30-59 ml/min (HCC) 03/07/2018   Peripheral edema 09/15/2017   BMI 37.0-37.9, adult 09/28/2016   Mixed hyperlipidemia 05/05/2016   Essential hypertension, benign 03/01/2014    Hypothyroidism 03/01/2014   OA (osteoarthritis) of knee 04/25/2010    REFERRING PROVIDER: Ollen Gross MD  REFERRING DIAG: Left total knee arthroplasty.  THERAPY DIAG:  Chronic pain of left knee  Stiffness of left knee, not elsewhere classified  Localized edema  Rationale for Evaluation and Treatment: Rehabilitation  ONSET DATE: 03/29/23 (surgery).  SUBJECTIVE:   SUBJECTIVE STATEMENT:  Pain in left knee low today, 2/10.     PERTINENT HISTORY: Right total knee replacement. PAIN:  Are you having pain? Yes: NPRS scale: 2/10 Pain location: Left knee Pain description: Ache, sore, throbbing, sharp, numb. Aggravating factors: Moving. Relieving factors: Ice and medication.  PRECAUTIONS: Other: No ultrasound.  WEIGHT BEARING RESTRICTIONS: No  FALLS:  Has patient fallen in last 6 months? No  LIVING ENVIRONMENT: Lives in: House/apartment Stairs: One step. Has following equipment at home: Dan Humphreys - 2 wheeled  OCCUPATION: Retired.  PLOF: Independent with basic ADLs  PATIENT GOALS: decrease pain and do more.  OBJECTIVE:  Note: Objective measures were completed at Evaluation unless otherwise noted.  PATIENT SURVEYS:  FOTO 41.57 on 05/13/23  EDEMA:  Circumferential: Left 5 cms > right.  PALPATION: Diffuse anterior left knee pain.  LOWER EXTREMITY ROM:     Active  Left 04/14/23 Left 05/13/23 Right  05/13/23  Hip flexion     Hip extension     Hip abduction     Hip adduction     Hip internal rotation     Hip external rotation     Knee flexion 110 degrees 106 105  Knee extension  7 17  Ankle dorsiflexion     Ankle plantarflexion     Ankle inversion     Ankle eversion      (Blank rows = not tested)   LOWER EXTREMITY MMT:    MMT Right 05/13/23 Left 05/13/23  Hip flexion    Hip extension    Hip abduction    Hip adduction    Hip internal rotation    Hip external rotation    Knee flexion 4/5 4/5  Knee extension 4/5; sore 4-/5  Ankle dorsiflexion     Ankle plantarflexion    Ankle inversion    Ankle eversion     (Blank rows = not tested)   GAIT: Safe ambulation with a FWW with decreased step length.   TODAY'S TREATMENT:                                                                                                                              DATE:   05/18/23:                                     EXERCISE LOG  Exercise Repetitions and Resistance Comments  Nustep  Level 4 x 15 minutes   Knee ext 10# x 3 minutes   Ham curls 30# x 3 minutes   Leg press  2 plates x 3 minutes       Seated with IFC at 80-150 Hz on 40% scan x 20 minutes to patient's left knee.  Normal modality response following removal of modality.                             PATIENT EDUCATION:  Education details: objective measures, knee mobility, and plan of care Person educated: Patient Education method: Explanation Education comprehension: verbalized understanding  HOME EXERCISE PROGRAM: Patient to perform HEP from hospital discharge.  ASSESSMENT:  CLINICAL IMPRESSION: Patient's left knee progression is very good.  She was provided with information to obtain a TENS unit so that she can use at home on both knees.    OBJECTIVE IMPAIRMENTS: Abnormal gait, decreased activity tolerance, decreased ROM, decreased strength, increased edema, and pain.   ACTIVITY LIMITATIONS: carrying, lifting, bending, stairs, and locomotion level  PARTICIPATION LIMITATIONS: meal prep, cleaning, and laundry  PERSONAL FACTORS: Time since onset of injury/illness/exacerbation are also affecting patient's functional outcome.   REHAB POTENTIAL: Excellent  CLINICAL DECISION MAKING: Stable/uncomplicated  EVALUATION COMPLEXITY: Low   GOALS:  SHORT TERM GOALS: Target date: 04/16/23  Ind with an initial HEP Goal status: MET  LONG TERM GOALS: Target date: 07/01/23.  Ind with an initial HEP.  Goal status: MET  2.  Active left knee extension to -5 degrees.    Baseline: 7 degrees from neutral  Goal status: IN PROGRESS  3.  Active left knee flexion to 115 degrees+ so the patient can perform functional tasks and do so with pain not > 2-3/10.   Baseline: 106 degrees Goal status: IN PROGRESS  4.  Increase left hip and knee strength to a solid 4+/5 to provide good stability for accomplishment of functional activities.  Baseline: see objective Goal status: IN PROGRESS  5.  Perform a reciprocating stair gait with one railing with pain not > 2-3/10.  Goal status: IN PROGRESS  6.  Perform ADL's with pain not > 3/10.   Baseline: 3-4/10 Goal status: IN PROGRESS   PLAN:  PT FREQUENCY:  2-3 times a week  PT DURATION: 4 weeks  PLANNED INTERVENTIONS: 97110-Therapeutic exercises, 97530- Therapeutic activity, [id number]- Neuromuscular re-education, 97535- Self Care, [id number]- Manual therapy, [id number]- Gait training, 97014- Electrical stimulation (unattended), 97016- Vasopneumatic device, Patient/Family education, Cryotherapy, and Moist heat  PLAN FOR NEXT SESSION: Nustep.  Progress per TKA protocol.  LE elevation and vasopneumatic. Send re certification to include modalities and treatment for the right   Chasitee Zenker, [place], PT 05/18/2023, 2:12 PM

## 2023-05-26 ENCOUNTER — Ambulatory Visit: Payer: Medicare Other | Attending: Orthopedic Surgery | Admitting: Physical Therapy

## 2023-05-26 DIAGNOSIS — M6281 Muscle weakness (generalized): Secondary | ICD-10-CM | POA: Diagnosis not present

## 2023-05-26 DIAGNOSIS — R600 Localized edema: Secondary | ICD-10-CM | POA: Diagnosis not present

## 2023-05-26 DIAGNOSIS — G8929 Other chronic pain: Secondary | ICD-10-CM | POA: Insufficient documentation

## 2023-05-26 DIAGNOSIS — M25662 Stiffness of left knee, not elsewhere classified: Secondary | ICD-10-CM | POA: Insufficient documentation

## 2023-05-26 DIAGNOSIS — M25562 Pain in left knee: Secondary | ICD-10-CM | POA: Diagnosis not present

## 2023-05-26 NOTE — Therapy (Addendum)
OUTPATIENT PHYSICAL THERAPY LOWER EXTREMITY TREATMENT   Patient Name: Kimberly Walsh MRN: [id number] DOB:September 18, 1945, 77 y.o., female Today's Date: 05/26/2023  END OF SESSION:  PT End of Session - 05/26/23 1342     Visit Number 12    Number of Visits 15    Date for PT Re-Evaluation 07/01/23    Authorization Type FOTO.    PT Start Time 1259    PT Stop Time 1352    PT Time Calculation (min) 53 min    Activity Tolerance Patient tolerated treatment well    Behavior During Therapy WFL for tasks assessed/performed                 Past Medical History:  Diagnosis Date   Arthritis    Cancer (HCC) 2016   skin cancer   Hyperlipidemia    Hypertension    Hypothyroidism    PONV (postoperative nausea and vomiting)    Stroke (HCC)    per pt. stroke was ruled out still on plavix   Venous stasis    Vertigo    Past Surgical History:  Procedure Laterality Date   basal carcinoma rt leg     CHOLECYSTECTOMY     FINGER SURGERY Left    Ring finger   MIDDLE EAR SURGERY     Fungus   TOTAL KNEE ARTHROPLASTY Right 12/14/2022   Procedure: TOTAL KNEE ARTHROPLASTY;  Surgeon: Ollen Gross, MD;  Location: [facility];  Service: Orthopedics;  Laterality: Right;   TOTAL KNEE ARTHROPLASTY Left 03/29/2023   Procedure: LEFT TOTAL KNEE ARTHROPLASTY;  Surgeon: Ollen Gross, MD;  Location: [facility];  Service: Orthopedics;  Laterality: Left;   TUBAL LIGATION     VEIN SURGERY     Patient Active Problem List   Diagnosis Date Noted   Osteoarthritis of left knee 03/29/2023   Primary osteoarthritis of left knee 12/14/2022   Primary osteoarthritis of right knee 12/14/2022   PVC (premature ventricular contraction) 09/22/2019   Late effect of cerebrovascular accident (CVA) 04/20/2019   CKD (chronic kidney disease) stage 3, GFR 30-59 ml/min (HCC) 03/07/2018   Peripheral edema 09/15/2017   BMI 37.0-37.9, adult 09/28/2016   Mixed hyperlipidemia 05/05/2016   Essential hypertension, benign 03/01/2014    Hypothyroidism 03/01/2014   OA (osteoarthritis) of knee 04/25/2010    REFERRING PROVIDER: Ollen Gross MD  REFERRING DIAG: Left total knee arthroplasty.  THERAPY DIAG:  Chronic pain of left knee - Plan: PT plan of care cert/re-cert  Stiffness of left knee, not elsewhere classified - Plan: PT plan of care cert/re-cert  Localized edema - Plan: PT plan of care cert/re-cert  Rationale for Evaluation and Treatment: Rehabilitation  ONSET DATE: 03/29/23 (surgery).  SUBJECTIVE:   SUBJECTIVE STATEMENT: Left knee feels much better.   PERTINENT HISTORY: Right total knee replacement. PAIN:  Are you having pain? Yes: NPRS scale: 2/10 Pain location: Left knee Pain description: Ache, sore, throbbing, sharp, numb. Aggravating factors: Moving. Relieving factors: Ice and medication.  PRECAUTIONS: Other: No ultrasound.  WEIGHT BEARING RESTRICTIONS: No  FALLS:  Has patient fallen in last 6 months? No  LIVING ENVIRONMENT: Lives in: House/apartment Stairs: One step. Has following equipment at home: Dan Humphreys - 2 wheeled  OCCUPATION: Retired.  PLOF: Independent with basic ADLs  PATIENT GOALS: decrease pain and do more.  OBJECTIVE:  Note: Objective measures were completed at Evaluation unless otherwise noted.  PATIENT SURVEYS:  FOTO 41.57 on 05/13/23  EDEMA:  Circumferential: Left 5 cms > right.  PALPATION: Diffuse anterior left knee  pain.  LOWER EXTREMITY ROM:     Active  Left 04/14/23 Left 05/13/23 Right 05/13/23  Hip flexion     Hip extension     Hip abduction     Hip adduction     Hip internal rotation     Hip external rotation     Knee flexion 110 degrees 106 105  Knee extension  7 17  Ankle dorsiflexion     Ankle plantarflexion     Ankle inversion     Ankle eversion      (Blank rows = not tested)   LOWER EXTREMITY MMT:    MMT Right 05/13/23 Left 05/13/23  Hip flexion    Hip extension    Hip abduction    Hip adduction    Hip internal rotation     Hip external rotation    Knee flexion 4/5 4/5  Knee extension 4/5; sore 4-/5  Ankle dorsiflexion    Ankle plantarflexion    Ankle inversion    Ankle eversion     (Blank rows = not tested)   GAIT: Safe ambulation with a FWW with decreased step length.   TODAY'S TREATMENT:                                                                                                                              DATE:   05/26/23:                                       EXERCISE LOG  Exercise Repetitions and Resistance Comments  Nustep  Level 4 x 17 minutes   Knee ext 10# x 3 minutes   Ham curls 30# x 3 minutes   Leg press  2 plates x 3 minutes       Seated with IFC at 80-150 Hz on 40% scan x 20 minutes to patient's left knee.  Normal modality response following removal of modality.    05/18/23:                                     EXERCISE LOG  Exercise Repetitions and Resistance Comments  Nustep  Level 4 x 15 minutes   Knee ext 10# x 3 minutes   Ham curls 30# x 3 minutes   Leg press  2 plates x 3 minutes       Seated with IFC at 80-150 Hz on 40% scan x 20 minutes to patient's left knee.  Normal modality response following removal of modality.                 PATIENT EDUCATION:  Education details: objective measures, knee mobility, and plan of care Person educated: Patient Education method: Explanation Education comprehension: verbalized understanding  HOME EXERCISE PROGRAM: Patient to perform HEP from hospital discharge.  ASSESSMENT:  CLINICAL IMPRESSION: Patient's left knee is doing very well and her pain is consistently been low.  Requesting 3 additional visits and plan discharge following.  OBJECTIVE IMPAIRMENTS: Abnormal gait, decreased activity tolerance, decreased ROM, decreased strength, increased edema, and pain.   ACTIVITY LIMITATIONS: carrying, lifting, bending, stairs, and locomotion level  PARTICIPATION LIMITATIONS: meal prep, cleaning, and  laundry  PERSONAL FACTORS: Time since onset of injury/illness/exacerbation are also affecting patient's functional outcome.   REHAB POTENTIAL: Excellent  CLINICAL DECISION MAKING: Stable/uncomplicated  EVALUATION COMPLEXITY: Low   GOALS:  SHORT TERM GOALS: Target date: 04/16/23  Ind with an initial HEP Goal status: MET   LONG TERM GOALS: Target date: 07/01/23.  Ind with an initial HEP.  Goal status: MET  2.  Active left knee extension to -5 degrees.   Baseline: 7 degrees from neutral  Goal status: IN PROGRESS  3.  Active left knee flexion to 115 degrees+ so the patient can perform functional tasks and do so with pain not > 2-3/10.   Baseline: 106 degrees Goal status: IN PROGRESS  4.  Increase left hip and knee strength to a solid 4+/5 to provide good stability for accomplishment of functional activities.  Baseline: see objective Goal status: IN PROGRESS  5.  Perform a reciprocating stair gait with one railing with pain not > 2-3/10.  Goal status: IN PROGRESS  6.  Perform ADL's with pain not > 3/10.   Baseline: 3-4/10 Goal status: IN PROGRESS   PLAN:  PT FREQUENCY:  2-3 times a week  PT DURATION: 4 weeks  PLANNED INTERVENTIONS: 97110-Therapeutic exercises, 97530- Therapeutic activity, [id number]- Neuromuscular re-education, 97535- Self Care, 10272- Manual therapy, [id number]- Gait training, 97014- Electrical stimulation (unattended), 97016- Vasopneumatic device, Patient/Family education, Cryotherapy, and Moist heat  PLAN FOR NEXT SESSION: Nustep.  Progress per TKA protocol.  LE elevation and vasopneumatic. Send re certification to include modalities and treatment for the right   Gaelan Glennon, [place], PT 05/26/2023, 2:08 PM

## 2023-06-01 DIAGNOSIS — M5481 Occipital neuralgia: Secondary | ICD-10-CM | POA: Diagnosis not present

## 2023-06-01 DIAGNOSIS — E785 Hyperlipidemia, unspecified: Secondary | ICD-10-CM | POA: Diagnosis not present

## 2023-06-01 DIAGNOSIS — Z8673 Personal history of transient ischemic attack (TIA), and cerebral infarction without residual deficits: Secondary | ICD-10-CM | POA: Diagnosis not present

## 2023-06-01 DIAGNOSIS — I672 Cerebral atherosclerosis: Secondary | ICD-10-CM | POA: Diagnosis not present

## 2023-06-01 DIAGNOSIS — I10 Essential (primary) hypertension: Secondary | ICD-10-CM | POA: Diagnosis not present

## 2023-06-01 DIAGNOSIS — I3481 Nonrheumatic mitral (valve) annulus calcification: Secondary | ICD-10-CM | POA: Diagnosis not present

## 2023-06-01 DIAGNOSIS — I6523 Occlusion and stenosis of bilateral carotid arteries: Secondary | ICD-10-CM | POA: Diagnosis not present

## 2023-06-01 DIAGNOSIS — I6389 Other cerebral infarction: Secondary | ICD-10-CM | POA: Diagnosis not present

## 2023-06-01 DIAGNOSIS — Z7902 Long term (current) use of antithrombotics/antiplatelets: Secondary | ICD-10-CM | POA: Diagnosis not present

## 2023-06-01 DIAGNOSIS — F0390 Unspecified dementia without behavioral disturbance: Secondary | ICD-10-CM | POA: Diagnosis not present

## 2023-06-01 DIAGNOSIS — Z09 Encounter for follow-up examination after completed treatment for conditions other than malignant neoplasm: Secondary | ICD-10-CM | POA: Diagnosis not present

## 2023-06-02 ENCOUNTER — Ambulatory Visit: Payer: Medicare Other | Admitting: Physical Therapy

## 2023-06-02 DIAGNOSIS — M25662 Stiffness of left knee, not elsewhere classified: Secondary | ICD-10-CM

## 2023-06-02 DIAGNOSIS — M25562 Pain in left knee: Secondary | ICD-10-CM | POA: Diagnosis not present

## 2023-06-02 DIAGNOSIS — G8929 Other chronic pain: Secondary | ICD-10-CM | POA: Diagnosis not present

## 2023-06-02 DIAGNOSIS — M6281 Muscle weakness (generalized): Secondary | ICD-10-CM | POA: Diagnosis not present

## 2023-06-02 DIAGNOSIS — R600 Localized edema: Secondary | ICD-10-CM | POA: Diagnosis not present

## 2023-06-02 NOTE — Therapy (Signed)
OUTPATIENT PHYSICAL THERAPY LOWER EXTREMITY TREATMENT   Patient Name: Kimberly Walsh MRN: [id number] DOB:June 30, 1945, 77 y.o., female Today's Date: 06/02/2023  END OF SESSION:  PT End of Session - 06/02/23 1323     Visit Number 13    Number of Visits 15    Date for PT Re-Evaluation 07/01/23    Authorization Type FOTO.    PT Start Time 0100    PT Stop Time 0155    PT Time Calculation (min) 55 min    Activity Tolerance Patient tolerated treatment well    Behavior During Therapy WFL for tasks assessed/performed                 Past Medical History:  Diagnosis Date   Arthritis    Cancer (HCC) 2016   skin cancer   Hyperlipidemia    Hypertension    Hypothyroidism    PONV (postoperative nausea and vomiting)    Stroke (HCC)    per pt. stroke was ruled out still on plavix   Venous stasis    Vertigo    Past Surgical History:  Procedure Laterality Date   basal carcinoma rt leg     CHOLECYSTECTOMY     FINGER SURGERY Left    Ring finger   MIDDLE EAR SURGERY     Fungus   TOTAL KNEE ARTHROPLASTY Right 12/14/2022   Procedure: TOTAL KNEE ARTHROPLASTY;  Surgeon: Ollen Gross, MD;  Location: [facility];  Service: Orthopedics;  Laterality: Right;   TOTAL KNEE ARTHROPLASTY Left 03/29/2023   Procedure: LEFT TOTAL KNEE ARTHROPLASTY;  Surgeon: Ollen Gross, MD;  Location: [facility];  Service: Orthopedics;  Laterality: Left;   TUBAL LIGATION     VEIN SURGERY     Patient Active Problem List   Diagnosis Date Noted   Osteoarthritis of left knee 03/29/2023   Primary osteoarthritis of left knee 12/14/2022   Primary osteoarthritis of right knee 12/14/2022   PVC (premature ventricular contraction) 09/22/2019   Late effect of cerebrovascular accident (CVA) 04/20/2019   CKD (chronic kidney disease) stage 3, GFR 30-59 ml/min (HCC) 03/07/2018   Peripheral edema 09/15/2017   BMI 37.0-37.9, adult 09/28/2016   Mixed hyperlipidemia 05/05/2016   Essential hypertension, benign 03/01/2014    Hypothyroidism 03/01/2014   OA (osteoarthritis) of knee 04/25/2010    REFERRING PROVIDER: Ollen Gross MD  REFERRING DIAG: Left total knee arthroplasty.  THERAPY DIAG:  Chronic pain of left knee  Stiffness of left knee, not elsewhere classified  Localized edema  Rationale for Evaluation and Treatment: Rehabilitation  ONSET DATE: 03/29/23 (surgery).  SUBJECTIVE:   SUBJECTIVE STATEMENT: Left knee feels much better.   PERTINENT HISTORY: Right total knee replacement. PAIN:  Are you having pain? Yes: NPRS scale: 2/10 Pain location: Left knee Pain description: Ache, sore, throbbing, sharp, numb. Aggravating factors: Moving. Relieving factors: Ice and medication.  PRECAUTIONS: Other: No ultrasound.  WEIGHT BEARING RESTRICTIONS: No  FALLS:  Has patient fallen in last 6 months? No  LIVING ENVIRONMENT: Lives in: House/apartment Stairs: One step. Has following equipment at home: Dan Humphreys - 2 wheeled  OCCUPATION: Retired.  PLOF: Independent with basic ADLs  PATIENT GOALS: decrease pain and do more.  OBJECTIVE:  Note: Objective measures were completed at Evaluation unless otherwise noted.  PATIENT SURVEYS:  FOTO 41.57 on 05/13/23  EDEMA:  Circumferential: Left 5 cms > right.  PALPATION: Diffuse anterior left knee pain.  LOWER EXTREMITY ROM:     Active  Left 04/14/23 Left 05/13/23 Right 05/13/23  Hip flexion  Hip extension     Hip abduction     Hip adduction     Hip internal rotation     Hip external rotation     Knee flexion 110 degrees 106 105  Knee extension  7 17  Ankle dorsiflexion     Ankle plantarflexion     Ankle inversion     Ankle eversion      (Blank rows = not tested)   LOWER EXTREMITY MMT:    MMT Right 05/13/23 Left 05/13/23  Hip flexion    Hip extension    Hip abduction    Hip adduction    Hip internal rotation    Hip external rotation    Knee flexion 4/5 4/5  Knee extension 4/5; sore 4-/5  Ankle dorsiflexion    Ankle  plantarflexion    Ankle inversion    Ankle eversion     (Blank rows = not tested)   GAIT: Safe ambulation with a FWW with decreased step length.   TODAY'S TREATMENT:                                                                                                                              DATE:   06/02/23:                                     EXERCISE LOG  Exercise Repetitions and Resistance Comments  Nustep Level 4 x 16 minutes.   Knee ext 10# x 3 minutes   Ham curls 30# x 3 minutes   Leg press 2 plates x 3 minutes.         Seated with IFC at 80-150 Hz on 40% scan x 20 minutes to patient's left knee.  Normal modality response following removal of modality.      05/26/23:                                       EXERCISE LOG  Exercise Repetitions and Resistance Comments  Nustep  Level 4 x 17 minutes   Knee ext 10# x 3 minutes   Ham curls 30# x 3 minutes   Leg press         Seated with IFC at 80-150 Hz on 40% scan x 20 minutes to patient's left knee.  Normal modality response following removal of modality.    05/18/23:                                     EXERCISE LOG  Exercise Repetitions and Resistance Comments  Nustep  Level 4 x 15 minutes   Knee ext 10# x 3 minutes   Ham curls 30# x 3 minutes   Leg press  2 plates x 3 minutes       Seated with IFC at 80-150 Hz on 40% scan x 20 minutes to patient's left knee.  Normal modality response following removal of modality.                             PATIENT EDUCATION:  Education details: objective measures, knee mobility, and plan of care Person educated: Patient Education method: Explanation Education comprehension: verbalized understanding  HOME EXERCISE PROGRAM: Patient to perform HEP from hospital discharge.  ASSESSMENT:  CLINICAL IMPRESSION: Patient is doing very well and achieved left knee flexion to 115 degrees with just minimal passive overpressure.    OBJECTIVE IMPAIRMENTS: Abnormal gait,  decreased activity tolerance, decreased ROM, decreased strength, increased edema, and pain.   ACTIVITY LIMITATIONS: carrying, lifting, bending, stairs, and locomotion level  PARTICIPATION LIMITATIONS: meal prep, cleaning, and laundry  PERSONAL FACTORS: Time since onset of injury/illness/exacerbation are also affecting patient's functional outcome.   REHAB POTENTIAL: Excellent  CLINICAL DECISION MAKING: Stable/uncomplicated  EVALUATION COMPLEXITY: Low   GOALS:  SHORT TERM GOALS: Target date: 04/16/23  Ind with an initial HEP Goal status: MET   LONG TERM GOALS: Target date: 07/01/23.  Ind with an initial HEP.  Goal status: MET  2.  Active left knee extension to -5 degrees.   Baseline: 7 degrees from neutral  Goal status: IN PROGRESS  3.  Active left knee flexion to 115 degrees+ so the patient can perform functional tasks and do so with pain not > 2-3/10.   Baseline: 106 degrees Goal status: IN PROGRESS  4.  Increase left hip and knee strength to a solid 4+/5 to provide good stability for accomplishment of functional activities.  Baseline: see objective Goal status: IN PROGRESS  5.  Perform a reciprocating stair gait with one railing with pain not > 2-3/10.  Goal status: IN PROGRESS  6.  Perform ADL's with pain not > 3/10.   Baseline: 3-4/10 Goal status: IN PROGRESS   PLAN:  PT FREQUENCY:  2-3 times a week  PT DURATION: 4 weeks  PLANNED INTERVENTIONS: 97110-Therapeutic exercises, 97530- Therapeutic activity, [id number]- Neuromuscular re-education, 97535- Self Care, [id number]- Manual therapy, [id number]- Gait training, 97014- Electrical stimulation (unattended), 97016- Vasopneumatic device, Patient/Family education, Cryotherapy, and Moist heat  PLAN FOR NEXT SESSION: Nustep.  Progress per TKA protocol.  LE elevation and vasopneumatic. Send re certification to include modalities and treatment for the right   Kj Imbert, [place], PT 06/02/2023, 3:51 PM

## 2023-06-08 ENCOUNTER — Ambulatory Visit (INDEPENDENT_AMBULATORY_CARE_PROVIDER_SITE_OTHER): Payer: Medicare Other

## 2023-06-08 DIAGNOSIS — E538 Deficiency of other specified B group vitamins: Secondary | ICD-10-CM | POA: Diagnosis not present

## 2023-06-08 NOTE — Progress Notes (Signed)
Patient is in office today for a nurse visit for B12 Injection. Patient Injection was given in the  Right deltoid. Patient tolerated injection well.

## 2023-06-11 ENCOUNTER — Ambulatory Visit: Payer: Medicare Other

## 2023-06-11 DIAGNOSIS — M6281 Muscle weakness (generalized): Secondary | ICD-10-CM

## 2023-06-11 DIAGNOSIS — R600 Localized edema: Secondary | ICD-10-CM | POA: Diagnosis not present

## 2023-06-11 DIAGNOSIS — M25562 Pain in left knee: Secondary | ICD-10-CM | POA: Diagnosis not present

## 2023-06-11 DIAGNOSIS — G8929 Other chronic pain: Secondary | ICD-10-CM

## 2023-06-11 DIAGNOSIS — M25662 Stiffness of left knee, not elsewhere classified: Secondary | ICD-10-CM

## 2023-06-11 NOTE — Therapy (Signed)
OUTPATIENT PHYSICAL THERAPY LOWER EXTREMITY TREATMENT   Patient Name: Kimberly Walsh MRN: [id number] DOB:[birth year], 77 y.o., female Today's Date: 06/11/2023  END OF SESSION:  PT End of Session - 06/11/23 1225     Visit Number 14    Number of Visits 15    Date for PT Re-Evaluation 07/01/23    Authorization Type FOTO.    PT Start Time 1145    PT Stop Time 1238    PT Time Calculation (min) 53 min    Activity Tolerance Patient tolerated treatment well    Behavior During Therapy WFL for tasks assessed/performed                  Past Medical History:  Diagnosis Date   Arthritis    Cancer (HCC) 2016   skin cancer   Hyperlipidemia    Hypertension    Hypothyroidism    PONV (postoperative nausea and vomiting)    Stroke (HCC)    per pt. stroke was ruled out still on plavix   Venous stasis    Vertigo    Past Surgical History:  Procedure Laterality Date   basal carcinoma rt leg     CHOLECYSTECTOMY     FINGER SURGERY Left    Ring finger   MIDDLE EAR SURGERY     Fungus   TOTAL KNEE ARTHROPLASTY Right 12/14/2022   Procedure: TOTAL KNEE ARTHROPLASTY;  Surgeon: Ollen Gross, MD;  Location: [facility];  Service: Orthopedics;  Laterality: Right;   TOTAL KNEE ARTHROPLASTY Left 03/29/2023   Procedure: LEFT TOTAL KNEE ARTHROPLASTY;  Surgeon: Ollen Gross, MD;  Location: [facility];  Service: Orthopedics;  Laterality: Left;   TUBAL LIGATION     VEIN SURGERY     Patient Active Problem List   Diagnosis Date Noted   Osteoarthritis of left knee 03/29/2023   Primary osteoarthritis of left knee 12/14/2022   Primary osteoarthritis of right knee 12/14/2022   PVC (premature ventricular contraction) 09/22/2019   Late effect of cerebrovascular accident (CVA) 04/20/2019   CKD (chronic kidney disease) stage 3, GFR 30-59 ml/min (HCC) 03/07/2018   Peripheral edema 09/15/2017   BMI 37.0-37.9, adult 09/28/2016   Mixed hyperlipidemia 05/05/2016   Essential hypertension, benign 03/01/2014    Hypothyroidism 03/01/2014   OA (osteoarthritis) of knee 04/25/2010    REFERRING PROVIDER: Ollen Gross MD  REFERRING DIAG: Left total knee arthroplasty.  THERAPY DIAG:  Chronic pain of left knee  Stiffness of left knee, not elsewhere classified  Localized edema  Muscle weakness (generalized)  Rationale for Evaluation and Treatment: Rehabilitation  ONSET DATE: 03/29/23 (surgery).  SUBJECTIVE:   SUBJECTIVE STATEMENT: Patient reports that she feels good today.    PERTINENT HISTORY: Right total knee replacement. PAIN:  Are you having pain? Yes: NPRS scale: 0/10 Pain location: Left knee Pain description: Ache, sore, throbbing, sharp, numb. Aggravating factors: Moving. Relieving factors: Ice and medication.  PRECAUTIONS: Other: No ultrasound.  WEIGHT BEARING RESTRICTIONS: No  FALLS:  Has patient fallen in last 6 months? No  LIVING ENVIRONMENT: Lives in: House/apartment Stairs: One step. Has following equipment at home: Dan Humphreys - 2 wheeled  OCCUPATION: Retired.  PLOF: Independent with basic ADLs  PATIENT GOALS: decrease pain and do more.  OBJECTIVE:  Note: Objective measures were completed at Evaluation unless otherwise noted.  PATIENT SURVEYS:  FOTO 41.57 on 05/13/23  EDEMA:  Circumferential: Left 5 cms > right.  PALPATION: Diffuse anterior left knee pain.  LOWER EXTREMITY ROM:     Active  Left 04/14/23  Left 05/13/23 Right 05/13/23  Hip flexion     Hip extension     Hip abduction     Hip adduction     Hip internal rotation     Hip external rotation     Knee flexion 110 degrees 106 105  Knee extension  7 17  Ankle dorsiflexion     Ankle plantarflexion     Ankle inversion     Ankle eversion      (Blank rows = not tested)   LOWER EXTREMITY MMT:    MMT Right 05/13/23 Left 05/13/23  Hip flexion    Hip extension    Hip abduction    Hip adduction    Hip internal rotation    Hip external rotation    Knee flexion 4/5 4/5  Knee  extension 4/5; sore 4-/5  Ankle dorsiflexion    Ankle plantarflexion    Ankle inversion    Ankle eversion     (Blank rows = not tested)   GAIT: Safe ambulation with a FWW with decreased step length.   TODAY'S TREATMENT:                                                                                                                              DATE:                                    06/11/23 EXERCISE LOG  Exercise Repetitions and Resistance Comments  Recumbent bike  L4 x 16 minutes   Cybex leg press 2 plates x 4 minutes; seat 8   Cybex knee extension  20# x 3 minutes   Cybex knee flexion  40# x 3 minutes        Blank cell = exercise not performed today  Modalities: no redness or adverse reaction to today's modalities  Date:  Unattended Estim: left IT band, pre mod @ 80-150 Hz, 15 mins, soreness Vaso: Knee, 34 degrees; low pressure, 15 mins, soreness  06/02/23: EXERCISE LOG  Exercise Repetitions and Resistance Comments  Nustep Level 4 x 16 minutes.   Knee ext 10# x 3 minutes   Ham curls 30# x 3 minutes   Leg press 2 plates x 3 minutes.         Seated with IFC at 80-150 Hz on 40% scan x 20 minutes to patient's left knee.  Normal modality response following removal of modality.      05/26/23:  EXERCISE LOG  Exercise Repetitions and Resistance Comments  Nustep  Level 4 x 17 minutes   Knee ext 10# x 3 minutes   Ham curls 30# x 3 minutes   Leg press         Seated with IFC at 80-150 Hz on 40% scan x 20 minutes to patient's left knee.  Normal modality response following removal of modality.    PATIENT EDUCATION:  Education details: objective measures, knee mobility, and plan of care Person educated: Patient Education method: Explanation Education comprehension: verbalized understanding  HOME EXERCISE PROGRAM: Patient to perform HEP from hospital discharge.  ASSESSMENT:  CLINICAL IMPRESSION: Patient was progressed with familiar interventions for improved lower  extremity strength. She required minimal cueing for improved eccentric quadriceps control with the weighted leg press. She experienced a mild increase in soreness with today's interventions, but this was able to be relieved with modalities upon the conclusion of today's active interventions. She reported that her knee felt good upon the conclusion of treatment. She will be discharged at her next appointment with an updated HEP, as able.  OBJECTIVE IMPAIRMENTS: Abnormal gait, decreased activity tolerance, decreased ROM, decreased strength, increased edema, and pain.   ACTIVITY LIMITATIONS: carrying, lifting, bending, stairs, and locomotion level  PARTICIPATION LIMITATIONS: meal prep, cleaning, and laundry  PERSONAL FACTORS: Time since onset of injury/illness/exacerbation are also affecting patient's functional outcome.   REHAB POTENTIAL: Excellent  CLINICAL DECISION MAKING: Stable/uncomplicated  EVALUATION COMPLEXITY: Low   GOALS:  SHORT TERM GOALS: Target date: 04/16/23  Ind with an initial HEP Goal status: MET   LONG TERM GOALS: Target date: 07/01/23.  Ind with an initial HEP.  Goal status: MET  2.  Active left knee extension to -5 degrees.   Baseline: 7 degrees from neutral  Goal status: IN PROGRESS  3.  Active left knee flexion to 115 degrees+ so the patient can perform functional tasks and do so with pain not > 2-3/10.   Baseline: 106 degrees Goal status: IN PROGRESS  4.  Increase left hip and knee strength to a solid 4+/5 to provide good stability for accomplishment of functional activities.  Baseline: see objective Goal status: IN PROGRESS  5.  Perform a reciprocating stair gait with one railing with pain not > 2-3/10.  Goal status: IN PROGRESS  6.  Perform ADL's with pain not > 3/10.   Baseline: 3-4/10 Goal status: IN PROGRESS   PLAN:  PT FREQUENCY:  2-3 times a week  PT DURATION: 4 weeks  PLANNED INTERVENTIONS: 97110-Therapeutic exercises, 97530-  Therapeutic activity, [id number]- Neuromuscular re-education, 97535- Self Care, [id number]- Manual therapy, [id number]- Gait training, 97014- Electrical stimulation (unattended), 97016- Vasopneumatic device, Patient/Family education, Cryotherapy, and Moist heat  PLAN FOR NEXT SESSION: Nustep.  Progress per TKA protocol.  LE elevation and vasopneumatic. Send re certification to include modalities and treatment for the right   Granville Lewis, PT 06/11/2023, 1:09 PM

## 2023-06-28 ENCOUNTER — Ambulatory Visit: Payer: Medicare Other

## 2023-06-28 VITALS — Ht 66.0 in | Wt 207.0 lb

## 2023-06-28 DIAGNOSIS — Z0000 Encounter for general adult medical examination without abnormal findings: Secondary | ICD-10-CM | POA: Diagnosis not present

## 2023-06-28 NOTE — Patient Instructions (Signed)
 Kimberly Walsh , Thank you for taking time to come for your Medicare Wellness Visit. I appreciate your ongoing commitment to your health goals. Please review the following plan we discussed and let me know if I can assist you in the future.   Referrals/Orders/Follow-Ups/Clinician Recommendations: Aim for 30 minutes of exercise or brisk walking, 6-8 glasses of water , and 5 servings of fruits and vegetables each day.  This is a list of the screening recommended for you and due dates:  Health Maintenance  Topic Date Due   COVID-19 Vaccine (3 - Moderna risk series) 07/29/2020   DEXA scan (bone density measurement)  07/28/2022   Zoster (Shingles) Vaccine (2 of 2) 08/04/2023*   Pap Smear  05/03/2024*   Medicare Annual Wellness Visit  06/27/2024   DTaP/Tdap/Td vaccine (3 - Td or Tdap) 09/05/2031   Pneumonia Vaccine  Completed   Flu Shot  Completed   Hepatitis C Screening  Completed   HPV Vaccine  Aged Out  *Topic was postponed. The date shown is not the original due date.    Advanced directives: (ACP Link)Information on Advanced Care Planning can be found at [place]  Secretary of [facility] Advance Health Care Directives Advance Health Care Directives ([email or web address]   Next Medicare Annual Wellness Visit scheduled for next year: Yes

## 2023-06-28 NOTE — Progress Notes (Signed)
 Subjective:   Kimberly Walsh is a 78 y.o. female who presents for Medicare Annual (Subsequent) preventive examination.  Visit Complete: Virtual I connected with  Kimberly Walsh on 06/28/23 by a audio enabled telemedicine application and verified that I am speaking with the correct person using two identifiers.  Patient Location: Home  Provider Location: Home Office  This patient declined Interactive audio and video telecommunications. Therefore the visit was completed with audio only.  I discussed the limitations of evaluation and management by telemedicine. The patient expressed understanding and agreed to proceed.  Vital Signs: Because this visit was a virtual/telehealth visit, some criteria may be missing or patient reported. Any vitals not documented were not able to be obtained and vitals that have been documented are patient reported.  Cardiac Risk Factors include: advanced age (>21men, >65 women);dyslipidemia;hypertension     Objective:    Today's Vitals   06/28/23 1123  Weight: 207 lb (93.9 kg)  Height: 5' 6 (1.676 m)   Body mass index is 33.41 kg/m.     06/28/2023   11:42 AM 04/02/2023    1:44 PM 03/29/2023   10:35 AM 03/29/2023    5:30 AM 03/18/2023   11:21 AM 12/14/2022    5:56 AM 12/07/2022   11:34 AM  Advanced Directives  Does Patient Have a Medical Advance Directive? No Yes Yes Yes Yes Yes Yes  Type of Advance Directive   Living will Healthcare Power of [place];Living will Healthcare Power of [place];Living will Healthcare Power of [place];Living will Healthcare Power of Attorney  Does patient want to make changes to medical advance directive?   No - Patient declined No - Patient declined No - Patient declined No - Patient declined No - Patient declined  Copy of Healthcare Power of Attorney in Chart?   Yes - validated most recent copy scanned in chart (See row information) No - copy requested No - copy requested No - copy requested No - copy requested  Would  patient like information on creating a medical advance directive? Yes (MAU/Ambulatory/Procedural Areas - Information given)          Current Medications (verified) Outpatient Encounter Medications as of 06/28/2023  Medication Sig   acetaminophen  (TYLENOL ) 500 MG tablet Take 1,000 mg by mouth every 8 (eight) hours as needed for moderate pain.   atorvastatin  (LIPITOR) 40 MG tablet Take 1 tablet (40 mg total) by mouth daily.   clopidogrel  (PLAVIX ) 75 MG tablet Take 1 tablet (75 mg total) by mouth daily.   furosemide  (LASIX ) 40 MG tablet Take 1 tablet (40 mg total) by mouth daily.   levothyroxine  (SYNTHROID ) 50 MCG tablet Take 1 tablet (50 mcg total) by mouth daily.   losartan  (COZAAR ) 25 MG tablet Take 1 tablet (25 mg total) by mouth daily.   meclizine  (ANTIVERT ) 25 MG tablet Take 1 tablet by mouth every 6 hours as need for vertigo   methocarbamol  (ROBAXIN ) 500 MG tablet Take 1 tablet (500 mg total) by mouth every 6 (six) hours as needed for muscle spasms.   ondansetron  (ZOFRAN ) 4 MG tablet Take 1 tablet (4 mg total) by mouth every 6 (six) hours as needed for nausea.   oxyCODONE  (OXY IR/ROXICODONE ) 5 MG immediate release tablet Take 1-2 tablets (5-10 mg total) by mouth every 6 (six) hours as needed for severe pain.   traMADol  (ULTRAM ) 50 MG tablet Take 1-2 tablets (50-100 mg total) by mouth every 6 (six) hours as needed for moderate pain.   Facility-Administered Encounter Medications  as of 06/28/2023  Medication   cyanocobalamin  (VITAMIN B12) injection 1,000 mcg    Allergies (verified) Ace inhibitors   History: Past Medical History:  Diagnosis Date   Arthritis    Cancer (HCC) 2016   skin cancer   Hyperlipidemia    Hypertension    Hypothyroidism    PONV (postoperative nausea and vomiting)    Stroke (HCC)    per pt. stroke was ruled out still on plavix    Venous stasis    Vertigo    Past Surgical History:  Procedure Laterality Date   basal carcinoma rt leg     CHOLECYSTECTOMY      FINGER SURGERY Left    Ring finger   MIDDLE EAR SURGERY     Fungus   TOTAL KNEE ARTHROPLASTY Right 12/14/2022   Procedure: TOTAL KNEE ARTHROPLASTY;  Surgeon: Melodi Lerner, MD;  Location: [facility];  Service: Orthopedics;  Laterality: Right;   TOTAL KNEE ARTHROPLASTY Left 03/29/2023   Procedure: LEFT TOTAL KNEE ARTHROPLASTY;  Surgeon: Melodi Lerner, MD;  Location: [facility];  Service: Orthopedics;  Laterality: Left;   TUBAL LIGATION     VEIN SURGERY     Family History  Problem Relation Age of Onset   COPD Father    Social History   Socioeconomic History   Marital status: Widowed    Spouse name: Not on file   Number of children: 3   Years of education: 12   Highest education level: High school graduate  Occupational History    Employer: [employer]    Comment: part time  Tobacco Use   Smoking status: Never   Smokeless tobacco: Never  Vaping Use   Vaping status: Never Used  Substance and Sexual Activity   Alcohol use: No   Drug use: No   Sexual activity: Not Currently  Other Topics Concern   Not on file  Social History Narrative   Retired Photographer   3 children-2 daughters and 1 son   10 grandchildren   Widowed since 04/22/2019.   Social Drivers of Corporate Investment Banker Strain: Low Risk  (06/28/2023)   Overall Financial Resource Strain (CARDIA)    Difficulty of Paying Living Expenses: Not hard at all  Food Insecurity: No Food Insecurity (06/28/2023)   Hunger Vital Sign    Worried About Running Out of Food in the Last Year: Never true    Ran Out of Food in the Last Year: Never true  Transportation Needs: No Transportation Needs (06/28/2023)   PRAPARE - Administrator, Civil Service (Medical): No    Lack of Transportation (Non-Medical): No  Physical Activity: Insufficiently Active (06/28/2023)   Exercise Vital Sign    Days of Exercise per Week: 3 days    Minutes of Exercise per Session: 30 min  Stress: No Stress Concern Present (06/28/2023)   [employer]  of Occupational Health - Occupational Stress Questionnaire    Feeling of Stress : Not at all  Social Connections: Moderately Integrated (06/28/2023)   Social Connection and Isolation Panel [NHANES]    Frequency of Communication with Friends and Family: More than three times a week    Frequency of Social Gatherings with Friends and Family: Three times a week    Attends Religious Services: More than 4 times per year    Active Member of Clubs or Organizations: Yes    Attends Banker Meetings: More than 4 times per year    Marital Status: Widowed    Tobacco  Counseling Counseling given: Not Answered   Clinical Intake:  Pre-visit preparation completed: Yes  Pain : No/denies pain     Diabetes: No  How often do you need to have someone help you when you read instructions, pamphlets, or other written materials from your doctor or pharmacy?: 1 - Never  Interpreter Needed?: No  Information entered by :: Charmaine Bloodgood LPN   Activities of Daily Living    06/28/2023   11:42 AM 03/29/2023   10:35 AM  In your present state of health, do you have any difficulty performing the following activities:  Hearing? 1 1  Vision? 0 0  Difficulty concentrating or making decisions? 0 0  Walking or climbing stairs? 0   Dressing or bathing? 0   Doing errands, shopping? 0 1  Preparing Food and eating ? N   Using the Toilet? N   In the past six months, have you accidently leaked urine? N   Do you have problems with loss of bowel control? N   Managing your Medications? N   Managing your Finances? N   Housekeeping or managing your Housekeeping? N     Patient Care Team: Gladis Mustard, FNP as PCP - General (Family Medicine)  Indicate any recent Medical Services you may have received from other than [facility] providers in the past year (date may be approximate).     Assessment:   This is a routine wellness examination for Laverta.  Hearing/Vision screen Hearing Screening -  Comments:: Hard of hearing; plans to get checked this year  Vision Screening - Comments:: No vision problems; will schedule routine eye exam soon     Goals Addressed             This Visit's Progress    Remain active and independent        Depression Screen    06/28/2023   11:40 AM 11/02/2022   10:09 AM 09/29/2022    9:02 AM 06/24/2022   10:31 AM 03/10/2022   12:20 PM 09/04/2021   12:13 PM 06/02/2021   12:36 PM  PHQ 2/9 Scores  PHQ - 2 Score 0 0 0 0 0 0 0  PHQ- 9 Score  0   0 0     Fall Risk    06/28/2023   11:41 AM 11/02/2022   10:09 AM 09/29/2022    9:02 AM 06/24/2022   10:30 AM 03/10/2022   12:20 PM  Fall Risk   Falls in the past year? 0 0 0 0 0  Number falls in past yr: 0   0   Injury with Fall? 0   0   Risk for fall due to : No Fall Risks   No Fall Risks   Follow up Falls prevention discussed;Education provided;Falls evaluation completed   Falls prevention discussed     MEDICARE RISK AT HOME: Medicare Risk at Home Any stairs in or around the home?: No If so, are there any without handrails?: No Home free of loose throw rugs in walkways, pet beds, electrical cords, etc?: Yes Adequate lighting in your home to reduce risk of falls?: Yes Life alert?: No Use of a cane, walker or w/c?: No Grab bars in the bathroom?: Yes Shower chair or bench in shower?: No Elevated toilet seat or a handicapped toilet?: Yes  TIMED UP AND GO:  Was the test performed?  No    Cognitive Function:        06/28/2023   11:42 AM 06/24/2022   10:33 AM 04/21/2021  10:45 AM 06/09/2019    9:43 AM  6CIT Screen  What Year? 0 points 0 points 0 points 0 points  What month? 0 points 0 points 0 points 0 points  What time? 0 points 0 points 0 points 0 points  Count back from 20 0 points 0 points 0 points 0 points  Months in reverse 0 points 0 points 0 points 0 points  Repeat phrase 0 points 0 points 0 points 2 points  Total Score 0 points 0 points 0 points 2 points     Immunizations Immunization History  Administered Date(s) Administered   Fluad Quad(high Dose 65+) 03/30/2020   Fluad Trivalent(High Dose 65+) 05/04/2023   Hepatitis B 09/25/1998, 10/31/1998, 04/03/1999   Hepatitis B, PED/ADOLESCENT 09/25/1998, 10/31/1998, 04/03/1999   Influenza Whole 03/10/2012   Influenza, High Dose Seasonal PF 04/02/2013, 04/03/2017, 02/22/2018, 03/09/2019   Influenza,inj,Quad PF,6+ Mos 05/05/2016   Influenza-Unspecified 03/31/2014, 03/31/2015, 05/05/2016, 03/09/2019, 03/29/2021   MMR 10/22/2011   Moderna SARS-COV2 Booster Vaccination 07/01/2020   Moderna Sars-Covid-2 Vaccination 07/31/2019, 08/27/2019   Pneumococcal Conjugate-13 05/09/2013   Pneumococcal Polysaccharide-23 09/28/2016, 05/04/2019   Tdap 04/09/2011, 09/04/2021   Zoster Recombinant(Shingrix ) 09/04/2021   Zoster, Live 05/23/2013    TDAP status: Up to date  Flu Vaccine status: Up to date  Pneumococcal vaccine status: Up to date  Covid-19 vaccine status: Information provided on how to obtain vaccines.   Qualifies for Shingles Vaccine? Yes   Zostavax completed Yes   Shingrix  Completed?: No.    Education has been provided regarding the importance of this vaccine. Patient has been advised to call insurance company to determine out of pocket expense if they have not yet received this vaccine. Advised may also receive vaccine at local pharmacy or Health Dept. Verbalized acceptance and understanding.  Screening Tests Health Maintenance  Topic Date Due   COVID-19 Vaccine (3 - Moderna risk series) 07/29/2020   DEXA SCAN  07/28/2022   Zoster Vaccines- Shingrix  (2 of 2) 08/04/2023 (Originally 10/30/2021)   Cervical Cancer Screening (Pap smear)  05/03/2024 (Originally 05/09/2014)   Medicare Annual Wellness (AWV)  06/27/2024   DTaP/Tdap/Td (3 - Td or Tdap) 09/05/2031   Pneumonia Vaccine 61+ Years old  Completed   INFLUENZA VACCINE  Completed   Hepatitis C Screening  Completed   HPV VACCINES  Aged  Out    Health Maintenance  Health Maintenance Due  Topic Date Due   COVID-19 Vaccine (3 - Moderna risk series) 07/29/2020   DEXA SCAN  07/28/2022    Colorectal cancer screening: No longer required.   Mammogram status: No longer required due to age and preference.  Bone Density status: Completed 07/28/17. Results reflect: Bone density results: NORMAL. Repeat every 5 years. (Declines further at this time)   Lung Cancer Screening: (Low Dose CT Chest recommended if Age 46-80 years, 20 pack-year currently smoking OR have quit w/in 15years.) does not qualify.   Lung Cancer Screening Referral: n/a  Additional Screening:  Hepatitis C Screening: does qualify; Completed 02/17/16  Vision Screening: Recommended annual ophthalmology exams for early detection of glaucoma and other disorders of the eye. Is the patient up to date with their annual eye exam?  No  Who is the provider or what is the name of the office in which the patient attends annual eye exams? Dr. Ladora  If pt is not established with a provider, would they like to be referred to a provider to establish care? No .   Dental Screening: Recommended annual dental exams for  proper oral hygiene  Community Resource Referral / Chronic Care Management: CRR required this visit?  No   CCM required this visit?  No     Plan:     I have personally reviewed and noted the following in the patient's chart:   Medical and social history Use of alcohol, tobacco or illicit drugs  Current medications and supplements including opioid prescriptions. Patient is not currently taking opioid prescriptions. Functional ability and status Nutritional status Physical activity Advanced directives List of other physicians Hospitalizations, surgeries, and ER visits in previous 12 months Vitals Screenings to include cognitive, depression, and falls Referrals and appointments  In addition, I have reviewed and discussed with patient certain preventive  protocols, quality metrics, and best practice recommendations. A written personalized care plan for preventive services as well as general preventive health recommendations were provided to patient.     Lavelle Pfeiffer [place], [place]   01/22/7973   After Visit Summary: (MyChart) Due to this being a telephonic visit, the after visit summary with patients personalized plan was offered to patient via MyChart   Nurse Notes: No concerns at this time

## 2023-07-12 ENCOUNTER — Ambulatory Visit: Payer: Medicare Other

## 2023-07-14 ENCOUNTER — Ambulatory Visit: Payer: Medicare Other

## 2023-07-19 ENCOUNTER — Ambulatory Visit (INDEPENDENT_AMBULATORY_CARE_PROVIDER_SITE_OTHER): Payer: Medicare Other

## 2023-07-19 DIAGNOSIS — I83891 Varicose veins of right lower extremities with other complications: Secondary | ICD-10-CM | POA: Diagnosis not present

## 2023-07-19 DIAGNOSIS — E538 Deficiency of other specified B group vitamins: Secondary | ICD-10-CM

## 2023-07-19 DIAGNOSIS — I872 Venous insufficiency (chronic) (peripheral): Secondary | ICD-10-CM | POA: Diagnosis not present

## 2023-07-19 DIAGNOSIS — I83892 Varicose veins of left lower extremities with other complications: Secondary | ICD-10-CM | POA: Diagnosis not present

## 2023-07-19 NOTE — Progress Notes (Signed)
Patient is in office today for a nurse visit for B12 Injection. Patient Injection was given in the  Left deltoid. Patient tolerated injection well.

## 2023-07-23 ENCOUNTER — Ambulatory Visit: Payer: Medicare Other

## 2023-08-25 ENCOUNTER — Ambulatory Visit: Payer: Medicare Other

## 2023-08-25 DIAGNOSIS — E538 Deficiency of other specified B group vitamins: Secondary | ICD-10-CM | POA: Diagnosis not present

## 2023-08-25 NOTE — Progress Notes (Signed)
 Patient is in office today for a nurse visit for B12 Injection. Patient Injection was given in the  Right deltoid. Patient tolerated injection well.

## 2023-09-29 ENCOUNTER — Ambulatory Visit (INDEPENDENT_AMBULATORY_CARE_PROVIDER_SITE_OTHER)

## 2023-09-29 DIAGNOSIS — H25812 Combined forms of age-related cataract, left eye: Secondary | ICD-10-CM | POA: Diagnosis not present

## 2023-09-29 DIAGNOSIS — E538 Deficiency of other specified B group vitamins: Secondary | ICD-10-CM

## 2023-09-29 DIAGNOSIS — Z01818 Encounter for other preprocedural examination: Secondary | ICD-10-CM | POA: Diagnosis not present

## 2023-09-29 DIAGNOSIS — H353132 Nonexudative age-related macular degeneration, bilateral, intermediate dry stage: Secondary | ICD-10-CM | POA: Diagnosis not present

## 2023-09-29 NOTE — Progress Notes (Signed)
 Patient is in office today for a nurse visit for B12 Injection. Patient Injection was given in the  Left deltoid. Patient tolerated injection well.

## 2023-10-22 DIAGNOSIS — I10 Essential (primary) hypertension: Secondary | ICD-10-CM | POA: Diagnosis not present

## 2023-10-22 DIAGNOSIS — H25812 Combined forms of age-related cataract, left eye: Secondary | ICD-10-CM | POA: Diagnosis not present

## 2023-10-22 DIAGNOSIS — E039 Hypothyroidism, unspecified: Secondary | ICD-10-CM | POA: Diagnosis not present

## 2023-11-02 ENCOUNTER — Encounter: Payer: Self-pay | Admitting: Nurse Practitioner

## 2023-11-02 ENCOUNTER — Ambulatory Visit (INDEPENDENT_AMBULATORY_CARE_PROVIDER_SITE_OTHER): Payer: Medicare Other | Admitting: Nurse Practitioner

## 2023-11-02 VITALS — BP 119/63 | HR 66 | Temp 97.7°F | Ht 66.0 in | Wt 220.0 lb

## 2023-11-02 DIAGNOSIS — I129 Hypertensive chronic kidney disease with stage 1 through stage 4 chronic kidney disease, or unspecified chronic kidney disease: Secondary | ICD-10-CM

## 2023-11-02 DIAGNOSIS — Z23 Encounter for immunization: Secondary | ICD-10-CM

## 2023-11-02 DIAGNOSIS — R600 Localized edema: Secondary | ICD-10-CM | POA: Diagnosis not present

## 2023-11-02 DIAGNOSIS — E538 Deficiency of other specified B group vitamins: Secondary | ICD-10-CM | POA: Diagnosis not present

## 2023-11-02 DIAGNOSIS — E782 Mixed hyperlipidemia: Secondary | ICD-10-CM | POA: Diagnosis not present

## 2023-11-02 DIAGNOSIS — N1831 Chronic kidney disease, stage 3a: Secondary | ICD-10-CM

## 2023-11-02 DIAGNOSIS — I6930 Unspecified sequelae of cerebral infarction: Secondary | ICD-10-CM

## 2023-11-02 DIAGNOSIS — E034 Atrophy of thyroid (acquired): Secondary | ICD-10-CM

## 2023-11-02 DIAGNOSIS — I10 Essential (primary) hypertension: Secondary | ICD-10-CM | POA: Diagnosis not present

## 2023-11-02 DIAGNOSIS — Z6837 Body mass index (BMI) 37.0-37.9, adult: Secondary | ICD-10-CM

## 2023-11-02 DIAGNOSIS — Z8673 Personal history of transient ischemic attack (TIA), and cerebral infarction without residual deficits: Secondary | ICD-10-CM

## 2023-11-02 LAB — LIPID PANEL

## 2023-11-02 MED ORDER — FUROSEMIDE 40 MG PO TABS: 40.0000 mg | ORAL_TABLET | Freq: Every day | ORAL | 1 refills | Status: DC

## 2023-11-02 MED ORDER — ATORVASTATIN CALCIUM 40 MG PO TABS: 40.0000 mg | ORAL_TABLET | Freq: Every day | ORAL | 1 refills | Status: DC

## 2023-11-02 MED ORDER — LEVOTHYROXINE SODIUM 50 MCG PO TABS
50.0000 ug | ORAL_TABLET | Freq: Every day | ORAL | 1 refills | Status: DC
Start: 2023-11-02 — End: 2024-04-28

## 2023-11-02 MED ORDER — CLOPIDOGREL BISULFATE 75 MG PO TABS: 75.0000 mg | ORAL_TABLET | Freq: Every day | ORAL | 1 refills | Status: DC

## 2023-11-02 MED ORDER — LOSARTAN POTASSIUM 25 MG PO TABS: 25.0000 mg | ORAL_TABLET | Freq: Every day | ORAL | 1 refills | Status: DC

## 2023-11-02 NOTE — Addendum Note (Signed)
 Addended by: Cherylyn Cos on: 11/02/2023 03:56 PM   Modules accepted: Orders

## 2023-11-02 NOTE — Progress Notes (Signed)
 Subjective:    Patient ID: Kimberly Walsh, female    DOB: 03-29-1946, 78 y.o.   MRN: [id number]   Chief Complaint: medical management of chronic issues     HPI:  Kimberly Walsh is a 78 y.o. who identifies as a female who was assigned female at birth.   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 Essential hypertension, benign No c/o chest pain sob or headache. Doe snot check blood pressure at home. BP Readings from Last 3 Encounters:  05/04/23 135/77  03/30/23 (!) 118/58  03/18/23 (!) 145/69     2. history of cerebrovascular accident (CVA) No permanent effects  3. Hypothyroidism due to acquired atrophy of thyroid  No issues that she is aware of Lab Results  Component Value Date   TSH 2.140 05/04/2023     4. Mixed hyperlipidemia Does not watch diet and does no dedicated exercises Lab Results  Component Value Date   CHOL 115 05/04/2023   HDL 38 (L) 05/04/2023   LDLCALC 54 05/04/2023   TRIG 129 05/04/2023   CHOLHDL 3.0 05/04/2023      5. Stage 3a chronic kidney disease (HCC) No voiding issues Lab Results  Component Value Date   CREATININE 0.99 05/04/2023     6. Peripheral edema Has bil lower ext edema daily  7. BMI 37.0-37.9, adult Weight is up 13lbs  Wt Readings from Last 3 Encounters:  11/02/23 220 lb (99.8 kg)  06/28/23 207 lb (93.9 kg)  05/04/23 207 lb (93.9 kg)   BMI Readings from Last 3 Encounters:  11/02/23 35.51 kg/m  06/28/23 33.41 kg/m  05/04/23 33.41 kg/m       New complaints: None today  Allergies  Allergen Reactions   Ace Inhibitors Cough   Outpatient Encounter Medications as of 11/02/2023  Medication Sig   acetaminophen  (TYLENOL ) 500 MG tablet Take 1,000 mg by mouth every 8 (eight) hours as needed for moderate pain.   atorvastatin  (LIPITOR) 40 MG tablet Take 1 tablet (40 mg total) by mouth daily.    clopidogrel  (PLAVIX ) 75 MG tablet Take 1 tablet (75 mg total) by mouth daily.   furosemide  (LASIX ) 40 MG tablet Take 1 tablet (40 mg total) by mouth daily.   levothyroxine  (SYNTHROID ) 50 MCG tablet Take 1 tablet (50 mcg total) by mouth daily.   losartan  (COZAAR ) 25 MG tablet Take 1 tablet (25 mg total) by mouth daily.   meclizine  (ANTIVERT ) 25 MG tablet Take 1 tablet by mouth every 6 hours as need for vertigo   methocarbamol  (ROBAXIN ) 500 MG tablet Take 1 tablet (500 mg total) by mouth every 6 (six) hours as needed for muscle spasms.   ondansetron  (ZOFRAN ) 4 MG tablet Take 1 tablet (4 mg total) by mouth every 6 (six) hours as needed for nausea.   oxyCODONE  (OXY IR/ROXICODONE ) 5 MG immediate release tablet Take 1-2 tablets (5-10 mg total) by mouth every 6 (six) hours as needed for severe pain.   traMADol  (ULTRAM ) 50 MG tablet Take 1-2 tablets (50-100 mg total) by mouth every 6 (six) hours as needed for moderate pain.   Facility-Administered Encounter Medications as of 11/02/2023  Medication   cyanocobalamin  (VITAMIN B12) injection 1,000 mcg    Past Surgical History:  Procedure Laterality Date   basal carcinoma rt leg     CHOLECYSTECTOMY     FINGER SURGERY Left    Ring finger  MIDDLE EAR SURGERY     Fungus   TOTAL KNEE ARTHROPLASTY Right 12/14/2022   Procedure: TOTAL KNEE ARTHROPLASTY;  Surgeon: Liliane Rei, MD;  Location: [facility];  Service: Orthopedics;  Laterality: Right;   TOTAL KNEE ARTHROPLASTY Left 03/29/2023   Procedure: LEFT TOTAL KNEE ARTHROPLASTY;  Surgeon: Liliane Rei, MD;  Location: [facility];  Service: Orthopedics;  Laterality: Left;   TUBAL LIGATION     VEIN SURGERY      Family History  Problem Relation Age of Onset   COPD Father       Controlled substance contract: n/a     Review of Systems  Constitutional:  Negative for diaphoresis.  Eyes:  Negative for pain.  Respiratory:  Negative for shortness of breath.   Cardiovascular:  Negative for chest pain,  palpitations and leg swelling.  Gastrointestinal:  Negative for abdominal pain.  Endocrine: Negative for polydipsia.  Skin:  Negative for rash.  Neurological:  Negative for dizziness, weakness and headaches.  Hematological:  Does not bruise/bleed easily.  All other systems reviewed and are negative.      Objective:   Physical Exam Vitals and nursing note reviewed.  Constitutional:      General: She is not in acute distress.    Appearance: Normal appearance. She is well-developed.  HENT:     Head: Normocephalic.     Right Ear: Tympanic membrane normal.     Left Ear: Tympanic membrane normal.     Nose: Nose normal.     Mouth/Throat:     Mouth: Mucous membranes are moist.  Eyes:     Pupils: Pupils are equal, round, and reactive to light.  Neck:     Vascular: No carotid bruit or JVD.  Cardiovascular:     Rate and Rhythm: Normal rate and regular rhythm.     Heart sounds: Murmur (1/6 systolic murmur) heard.  Pulmonary:     Effort: Pulmonary effort is normal. No respiratory distress.     Breath sounds: Normal breath sounds. No wheezing or rales.  Chest:     Chest wall: No tenderness.  Abdominal:     General: Bowel sounds are normal. There is no distension or abdominal bruit.     Palpations: Abdomen is soft. There is no hepatomegaly, splenomegaly, mass or pulsatile mass.     Tenderness: There is no abdominal tenderness.  Musculoskeletal:        General: Normal range of motion.     Cervical back: Normal range of motion and neck supple.     Right lower leg: Edema (1+) present.     Left lower leg: No edema.  Lymphadenopathy:     Cervical: No cervical adenopathy.  Skin:    General: Skin is warm and dry.  Neurological:     Mental Status: She is alert and oriented to person, place, and time.     Deep Tendon Reflexes: Reflexes are normal and symmetric.  Psychiatric:        Behavior: Behavior normal.        Thought Content: Thought content normal.        Judgment: Judgment  normal.     BP 119/63   Pulse 66   Temp 97.7 F (36.5 C) (Temporal)   Ht 5\' 6"  (1.676 m)   Wt 220 lb (99.8 kg)   SpO2 98%   BMI 35.51 kg/m         Assessment & Plan:   Kimberly Walsh comes in today with chief complaint of No  chief complaint on file.   Diagnosis and orders addressed:  1. Essential hypertension, benign Low sodium diet - losartan  (COZAAR ) 25 MG tablet; Take 1 tablet (25 mg total) by mouth daily.  Dispense: 90 tablet; Refill: 1 - CBC with Differential/Platelet - CMP14+EGFR  2. Late effect of cerebrovascular accident (CVA) No permanent effects - clopidogrel  (PLAVIX ) 75 MG tablet; Take 1 tablet (75 mg total) by mouth daily.  Dispense: 90 tablet; Refill: 1  3. Hypothyroidism due to acquired atrophy of thyroid  Labs pending - levothyroxine  (SYNTHROID ) 50 MCG tablet; Take 1 tablet (50 mcg total) by mouth daily.  Dispense: 90 tablet; Refill: 1 - Thyroid  Panel With TSH  4. Mixed hyperlipidemia Low fat diet - atorvastatin  (LIPITOR) 40 MG tablet; Take 1 tablet (40 mg total) by mouth daily.  Dispense: 90 tablet; Refill: 1 - Lipid panel  5. Stage 3a chronic kidney disease (HCC) Labs  pending  6. Peripheral edema Elevate legs when sitting - furosemide  (LASIX ) 40 MG tablet; Take 1 tablet (40 mg total) by mouth daily.  Dispense: 90 tablet; Refill: 1  7. BMI 37.0-37.9, adult Discussed diet and exercise for person with BMI >25 Will recheck weight in 3-6 months    Labs pending Health Maintenance reviewed Diet and exercise encouraged  Follow up plan: 6 months   Mary-Margaret Gaylyn Keas, FNP

## 2023-11-02 NOTE — Patient Instructions (Signed)

## 2023-11-03 ENCOUNTER — Ambulatory Visit

## 2023-11-03 LAB — CBC WITH DIFFERENTIAL/PLATELET
Basophils Absolute: 0 10*3/uL (ref 0.0–0.2)
Basos: 1 %
EOS (ABSOLUTE): 0.1 10*3/uL (ref 0.0–0.4)
Eos: 2 %
Hematocrit: 42.9 % (ref 34.0–46.6)
Hemoglobin: 13.5 g/dL (ref 11.1–15.9)
Immature Grans (Abs): 0 10*3/uL (ref 0.0–0.1)
Immature Granulocytes: 0 %
Lymphocytes Absolute: 1.6 10*3/uL (ref 0.7–3.1)
Lymphs: 28 %
MCH: 28.8 pg (ref 26.6–33.0)
MCHC: 31.5 g/dL (ref 31.5–35.7)
MCV: 92 fL (ref 79–97)
Monocytes Absolute: 0.3 10*3/uL (ref 0.1–0.9)
Monocytes: 5 %
Neutrophils Absolute: 3.5 10*3/uL (ref 1.4–7.0)
Neutrophils: 64 %
Platelets: 126 10*3/uL — ABNORMAL LOW (ref 150–450)
RBC: 4.69 x10E6/uL (ref 3.77–5.28)
RDW: 14 % (ref 11.7–15.4)
WBC: 5.5 10*3/uL (ref 3.4–10.8)

## 2023-11-03 LAB — CMP14+EGFR
ALT: 20 IU/L (ref 0–32)
AST: 18 IU/L (ref 0–40)
Albumin: 4.1 g/dL (ref 3.8–4.8)
Alkaline Phosphatase: 151 IU/L — ABNORMAL HIGH (ref 44–121)
BUN/Creatinine Ratio: 16 (ref 12–28)
BUN: 17 mg/dL (ref 8–27)
Bilirubin Total: 0.7 mg/dL (ref 0.0–1.2)
CO2: 22 mmol/L (ref 20–29)
Calcium: 8.9 mg/dL (ref 8.7–10.3)
Chloride: 104 mmol/L (ref 96–106)
Creatinine, Ser: 1.05 mg/dL — ABNORMAL HIGH (ref 0.57–1.00)
Globulin, Total: 2.7 g/dL (ref 1.5–4.5)
Glucose: 89 mg/dL (ref 70–99)
Potassium: 4.5 mmol/L (ref 3.5–5.2)
Sodium: 141 mmol/L (ref 134–144)
Total Protein: 6.8 g/dL (ref 6.0–8.5)
eGFR: 55 mL/min/{1.73_m2} — ABNORMAL LOW (ref >59)

## 2023-11-03 LAB — THYROID PANEL WITH TSH
Free Thyroxine Index: 2 (ref 1.2–4.9)
T3 Uptake Ratio: 28 % (ref 24–39)
T4, Total: 7 ug/dL (ref 4.5–12.0)
TSH: 2.15 u[IU]/mL (ref 0.450–4.500)

## 2023-11-03 LAB — LIPID PANEL
Cholesterol, Total: 107 mg/dL (ref 100–199)
HDL: 38 mg/dL — ABNORMAL LOW (ref >39)
LDL CALC COMMENT:: 2.8 ratio (ref 0.0–4.4)
LDL Chol Calc (NIH): 54 mg/dL (ref 0–99)
Triglycerides: 71 mg/dL (ref 0–149)
VLDL Cholesterol Cal: 15 mg/dL (ref 5–40)

## 2023-11-04 ENCOUNTER — Ambulatory Visit: Payer: Self-pay | Admitting: Nurse Practitioner

## 2023-11-05 DIAGNOSIS — E039 Hypothyroidism, unspecified: Secondary | ICD-10-CM | POA: Diagnosis not present

## 2023-11-05 DIAGNOSIS — H25811 Combined forms of age-related cataract, right eye: Secondary | ICD-10-CM | POA: Diagnosis not present

## 2023-11-30 DIAGNOSIS — R519 Headache, unspecified: Secondary | ICD-10-CM | POA: Diagnosis not present

## 2023-11-30 DIAGNOSIS — M542 Cervicalgia: Secondary | ICD-10-CM | POA: Diagnosis not present

## 2023-11-30 DIAGNOSIS — E538 Deficiency of other specified B group vitamins: Secondary | ICD-10-CM | POA: Diagnosis not present

## 2023-11-30 DIAGNOSIS — M5481 Occipital neuralgia: Secondary | ICD-10-CM | POA: Diagnosis not present

## 2023-12-15 ENCOUNTER — Ambulatory Visit (INDEPENDENT_AMBULATORY_CARE_PROVIDER_SITE_OTHER)

## 2023-12-15 DIAGNOSIS — E538 Deficiency of other specified B group vitamins: Secondary | ICD-10-CM | POA: Diagnosis not present

## 2023-12-15 MED ORDER — CYANOCOBALAMIN 1000 MCG/ML IJ SOLN
1000.0000 ug | INTRAMUSCULAR | Status: AC
  Administered 2023-12-15 – 2024-07-20 (×16): 1000 ug via INTRAMUSCULAR

## 2023-12-15 NOTE — Progress Notes (Signed)
 Patient is in office today for a nurse visit for B12 Injection. Patient Injection was given in the  Left deltoid. Patient tolerated injection well.

## 2023-12-17 DIAGNOSIS — H02831 Dermatochalasis of right upper eyelid: Secondary | ICD-10-CM | POA: Diagnosis not present

## 2023-12-17 DIAGNOSIS — H02413 Mechanical ptosis of bilateral eyelids: Secondary | ICD-10-CM | POA: Diagnosis not present

## 2023-12-17 DIAGNOSIS — H02834 Dermatochalasis of left upper eyelid: Secondary | ICD-10-CM | POA: Diagnosis not present

## 2023-12-26 ENCOUNTER — Other Ambulatory Visit: Payer: Self-pay | Admitting: Nurse Practitioner

## 2023-12-26 DIAGNOSIS — R42 Dizziness and giddiness: Secondary | ICD-10-CM

## 2023-12-29 ENCOUNTER — Ambulatory Visit (INDEPENDENT_AMBULATORY_CARE_PROVIDER_SITE_OTHER): Admitting: *Deleted

## 2023-12-29 DIAGNOSIS — E538 Deficiency of other specified B group vitamins: Secondary | ICD-10-CM

## 2023-12-29 NOTE — Progress Notes (Signed)
B12 injection given right deltoid intramuscular. Patient tolerated well.

## 2024-01-12 ENCOUNTER — Ambulatory Visit (INDEPENDENT_AMBULATORY_CARE_PROVIDER_SITE_OTHER): Admitting: *Deleted

## 2024-01-12 DIAGNOSIS — E538 Deficiency of other specified B group vitamins: Secondary | ICD-10-CM | POA: Diagnosis not present

## 2024-01-12 NOTE — Progress Notes (Signed)
 Patient is in office today for a nurse visit for B12 Injection. Patient Injection was given in the  Left deltoid. Patient tolerated injection well.

## 2024-01-23 DIAGNOSIS — Z8673 Personal history of transient ischemic attack (TIA), and cerebral infarction without residual deficits: Secondary | ICD-10-CM | POA: Diagnosis not present

## 2024-01-26 ENCOUNTER — Ambulatory Visit

## 2024-01-26 DIAGNOSIS — Z79890 Hormone replacement therapy (postmenopausal): Secondary | ICD-10-CM | POA: Diagnosis not present

## 2024-01-26 DIAGNOSIS — I10 Essential (primary) hypertension: Secondary | ICD-10-CM | POA: Diagnosis not present

## 2024-01-26 DIAGNOSIS — H02831 Dermatochalasis of right upper eyelid: Secondary | ICD-10-CM | POA: Diagnosis not present

## 2024-01-26 DIAGNOSIS — E039 Hypothyroidism, unspecified: Secondary | ICD-10-CM | POA: Diagnosis not present

## 2024-01-26 DIAGNOSIS — Z6835 Body mass index (BMI) 35.0-35.9, adult: Secondary | ICD-10-CM | POA: Diagnosis not present

## 2024-01-26 DIAGNOSIS — E669 Obesity, unspecified: Secondary | ICD-10-CM | POA: Diagnosis not present

## 2024-01-26 DIAGNOSIS — H02834 Dermatochalasis of left upper eyelid: Secondary | ICD-10-CM | POA: Diagnosis not present

## 2024-01-26 DIAGNOSIS — Z79899 Other long term (current) drug therapy: Secondary | ICD-10-CM | POA: Diagnosis not present

## 2024-01-26 DIAGNOSIS — E7800 Pure hypercholesterolemia, unspecified: Secondary | ICD-10-CM | POA: Diagnosis not present

## 2024-02-01 ENCOUNTER — Telehealth: Payer: Self-pay | Admitting: Family Medicine

## 2024-02-01 NOTE — Telephone Encounter (Signed)
 Copied from CRM [id number]. Topic: Appointments - Scheduling Inquiry for Clinic >> Feb 01, 2024  4:31 PM Jasmin G wrote: Reason for CRM: Pt needs a call back at [phone] to schedule a b12 shot.

## 2024-02-01 NOTE — Telephone Encounter (Signed)
Appt scheduled and patient notified

## 2024-02-02 ENCOUNTER — Ambulatory Visit (INDEPENDENT_AMBULATORY_CARE_PROVIDER_SITE_OTHER): Admitting: *Deleted

## 2024-02-02 DIAGNOSIS — E538 Deficiency of other specified B group vitamins: Secondary | ICD-10-CM

## 2024-02-02 NOTE — Progress Notes (Signed)
 Patient is in office today for a nurse visit for B12 Injection. Patient Injection was given in the  Right deltoid. Patient tolerated injection well.

## 2024-02-09 ENCOUNTER — Ambulatory Visit

## 2024-02-16 ENCOUNTER — Ambulatory Visit (INDEPENDENT_AMBULATORY_CARE_PROVIDER_SITE_OTHER)

## 2024-02-16 DIAGNOSIS — E538 Deficiency of other specified B group vitamins: Secondary | ICD-10-CM

## 2024-02-16 NOTE — Progress Notes (Signed)
 Patient is in office today for a nurse visit for B12 Injection. Patient Injection was given in the  Left deltoid. Patient tolerated injection well.

## 2024-03-01 ENCOUNTER — Ambulatory Visit

## 2024-03-03 ENCOUNTER — Encounter: Payer: Self-pay | Admitting: Nurse Practitioner

## 2024-03-03 ENCOUNTER — Ambulatory Visit (INDEPENDENT_AMBULATORY_CARE_PROVIDER_SITE_OTHER): Admitting: Nurse Practitioner

## 2024-03-03 VITALS — BP 142/63 | HR 86 | Temp 97.9°F | Ht 66.0 in | Wt 217.0 lb

## 2024-03-03 DIAGNOSIS — Z6835 Body mass index (BMI) 35.0-35.9, adult: Secondary | ICD-10-CM | POA: Diagnosis not present

## 2024-03-03 DIAGNOSIS — E538 Deficiency of other specified B group vitamins: Secondary | ICD-10-CM | POA: Diagnosis not present

## 2024-03-03 DIAGNOSIS — Z23 Encounter for immunization: Secondary | ICD-10-CM

## 2024-03-03 DIAGNOSIS — L570 Actinic keratosis: Secondary | ICD-10-CM

## 2024-03-03 NOTE — Progress Notes (Signed)
 Subjective:    Patient ID: Kimberly Walsh, female    DOB: Jan 13, 1946, 78 y.o.   MRN: [id number]   Chief Complaint: Mole on chest, Places on legs, and Wants to discuss weight loss   HPI  Patient in with several complaints: - Mole on chest- no change in size. - flaky dry lesion on right lower leg. Tiny- patient just keeps picking at it. - Weight gain- wants to try GLP shots Wt Readings from Last 3 Encounters:  03/03/24 217 lb (98.4 kg)  11/02/23 220 lb (99.8 kg)  06/28/23 207 lb (93.9 kg)   BMI Readings from Last 3 Encounters:  03/03/24 35.02 kg/m  11/02/23 35.51 kg/m  06/28/23 33.41 kg/m    Patient Active Problem List   Diagnosis Date Noted   Osteoarthritis of left knee 03/29/2023   Primary osteoarthritis of left knee 12/14/2022   Primary osteoarthritis of right knee 12/14/2022   PVC (premature ventricular contraction) 09/22/2019   History of CVA (cerebrovascular accident) 04/20/2019   CKD (chronic kidney disease) stage 3, GFR 30-59 ml/min (HCC) 03/07/2018   Peripheral edema 09/15/2017   BMI 37.0-37.9, adult 09/28/2016   Mixed hyperlipidemia 05/05/2016   Essential hypertension, benign 03/01/2014   Hypothyroidism 03/01/2014   OA (osteoarthritis) of knee 04/25/2010       Review of Systems  Constitutional:  Negative for diaphoresis.  Eyes:  Negative for pain.  Respiratory:  Negative for shortness of breath.   Cardiovascular:  Negative for chest pain, palpitations and leg swelling.  Gastrointestinal:  Negative for abdominal pain.  Endocrine: Negative for polydipsia.  Skin:  Negative for rash.  Neurological:  Negative for dizziness, weakness and headaches.  Hematological:  Does not bruise/bleed easily.  All other systems reviewed and are negative.      Objective:   Physical Exam Constitutional:      Appearance: Normal appearance. She is obese.  Cardiovascular:     Rate and Rhythm: Normal rate and regular rhythm.     Heart sounds: Normal heart sounds.   Pulmonary:     Breath sounds: Normal breath sounds.  Skin:    General: Skin is warm.     Comments: Flaky 1cm raised lesion anterior chest wall  Crusty 1 cm lesion right lower leg  Neurological:     General: No focal deficit present.     Mental Status: She is alert and oriented to person, place, and time.  Psychiatric:        Mood and Affect: Mood normal.        Behavior: Behavior normal.     BP (!) 142/63   Pulse 86   Temp 97.9 F (36.6 C) (Temporal)   Ht 5' 6 (1.676 m)   Wt 217 lb (98.4 kg)   SpO2 97%   BMI 35.02 kg/m   Cryotherapy of chest wall lesion and right lower leg lesion      Assessment & Plan:   Kimberly Walsh in today with chief complaint of Mole on chest, Places on legs, and Wants to discuss weight loss   1. Keratosis (Primary) Do not pick at areas Watch for signs of infection  2. BMI 35.0-35.9,adult Discuss trizepatide and simuglutide- insurance probably will not pay for meds Will try GLP-pre- OTCpowder in cranberry juice    The above assessment and management plan was discussed with the patient. The patient verbalized understanding of and has agreed to the management plan. Patient is aware to call the clinic if symptoms persist or worsen. Patient is  aware when to return to the clinic for a follow-up visit. Patient educated on when it is appropriate to go to the emergency department.   Mary-Margaret Gladis, FNP

## 2024-03-03 NOTE — Progress Notes (Deleted)
 Subjective:    Patient ID: Kimberly Walsh, female    DOB: July 09, 1945, 78 y.o.   MRN: [id number]   Chief Complaint: medical management of chronic issues     HPI:  Kimberly Walsh is a 78 y.o. who identifies as a female who was assigned female at birth.   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 Essential hypertension, benign No c/o chest pain sob or headache. Does not check blood pressure at home. BP Readings from Last 3 Encounters:  03/03/24 (!) 142/63  11/02/23 119/63  05/04/23 135/77     2. history of cerebrovascular accident (CVA) No permanent effects  3. Hypothyroidism due to acquired atrophy of thyroid  No issues that she is aware of Lab Results  Component Value Date   TSH 2.150 11/02/2023     4. Mixed hyperlipidemia Does not watch diet and does no dedicated exercises Lab Results  Component Value Date   CHOL 107 11/02/2023   HDL 38 (L) 11/02/2023   LDLCALC 54 11/02/2023   TRIG 71 11/02/2023   CHOLHDL 2.8 11/02/2023      5. Stage 3a chronic kidney disease (HCC) No voiding issues Lab Results  Component Value Date   CREATININE 1.05 (H) 11/02/2023     6. Peripheral edema Has bil lower ext edema daily  7. BMI 37.0-37.9, adult Weight is up 13lbs  ***      New complaints: None today  Allergies  Allergen Reactions   Ace Inhibitors Cough   Outpatient Encounter Medications as of 03/03/2024  Medication Sig   acetaminophen  (TYLENOL ) 500 MG tablet Take 1,000 mg by mouth every 8 (eight) hours as needed for moderate pain.   atorvastatin  (LIPITOR) 40 MG tablet Take 1 tablet (40 mg total) by mouth daily.   clopidogrel  (PLAVIX ) 75 MG tablet Take 1 tablet (75 mg total) by mouth daily.   furosemide  (LASIX ) 40 MG tablet Take 1 tablet (40 mg total) by mouth daily.   levothyroxine  (SYNTHROID ) 50 MCG tablet Take 1 tablet (50 mcg total)  by mouth daily.   losartan  (COZAAR ) 25 MG tablet Take 1 tablet (25 mg total) by mouth daily.   meclizine  (ANTIVERT ) 25 MG tablet TAKE 1 TABLET BY MOUTH EVERY 6 HOURS AS NEEDED FOR VERTIGO   methocarbamol  (ROBAXIN ) 500 MG tablet Take 1 tablet (500 mg total) by mouth every 6 (six) hours as needed for muscle spasms.   ondansetron  (ZOFRAN ) 4 MG tablet Take 1 tablet (4 mg total) by mouth every 6 (six) hours as needed for nausea.   oxyCODONE  (OXY IR/ROXICODONE ) 5 MG immediate release tablet Take 1-2 tablets (5-10 mg total) by mouth every 6 (six) hours as needed for severe pain.   traMADol  (ULTRAM ) 50 MG tablet Take 1-2 tablets (50-100 mg total) by mouth every 6 (six) hours as needed for moderate pain.   Facility-Administered Encounter Medications as of 03/03/2024  Medication   cyanocobalamin  (VITAMIN B12) injection 1,000 mcg    Past Surgical History:  Procedure Laterality Date   basal carcinoma rt leg     CHOLECYSTECTOMY     FINGER SURGERY Left    Ring finger   MIDDLE EAR SURGERY     Fungus   TOTAL KNEE ARTHROPLASTY Right 12/14/2022   Procedure: TOTAL KNEE ARTHROPLASTY;  Surgeon: Melodi Lerner, MD;  Location: [facility];  Service: Orthopedics;  Laterality: Right;   TOTAL KNEE ARTHROPLASTY Left  03/29/2023   Procedure: LEFT TOTAL KNEE ARTHROPLASTY;  Surgeon: Melodi Lerner, MD;  Location: [facility];  Service: Orthopedics;  Laterality: Left;   TUBAL LIGATION     VEIN SURGERY      Family History  Problem Relation Age of Onset   COPD Father       Controlled substance contract: n/a     Review of Systems  Constitutional:  Negative for diaphoresis.  Eyes:  Negative for pain.  Respiratory:  Negative for shortness of breath.   Cardiovascular:  Negative for chest pain, palpitations and leg swelling.  Gastrointestinal:  Negative for abdominal pain.  Endocrine: Negative for polydipsia.  Skin:  Negative for rash.  Neurological:  Negative for dizziness, weakness and headaches.  Hematological:   Does not bruise/bleed easily.  All other systems reviewed and are negative.      Objective:   Physical Exam Vitals and nursing note reviewed.  Constitutional:      General: She is not in acute distress.    Appearance: Normal appearance. She is well-developed.  HENT:     Head: Normocephalic.     Right Ear: Tympanic membrane normal.     Left Ear: Tympanic membrane normal.     Nose: Nose normal.     Mouth/Throat:     Mouth: Mucous membranes are moist.  Eyes:     Pupils: Pupils are equal, round, and reactive to light.  Neck:     Vascular: No carotid bruit or JVD.  Cardiovascular:     Rate and Rhythm: Normal rate and regular rhythm.     Heart sounds: Murmur (1/6 systolic murmur) heard.  Pulmonary:     Effort: Pulmonary effort is normal. No respiratory distress.     Breath sounds: Normal breath sounds. No wheezing or rales.  Chest:     Chest wall: No tenderness.  Abdominal:     General: Bowel sounds are normal. There is no distension or abdominal bruit.     Palpations: Abdomen is soft. There is no hepatomegaly, splenomegaly, mass or pulsatile mass.     Tenderness: There is no abdominal tenderness.  Musculoskeletal:        General: Normal range of motion.     Cervical back: Normal range of motion and neck supple.     Right lower leg: Edema (1+) present.     Left lower leg: No edema.  Lymphadenopathy:     Cervical: No cervical adenopathy.  Skin:    General: Skin is warm and dry.  Neurological:     Mental Status: She is alert and oriented to person, place, and time.     Deep Tendon Reflexes: Reflexes are normal and symmetric.  Psychiatric:        Behavior: Behavior normal.        Thought Content: Thought content normal.        Judgment: Judgment normal.     BP (!) 142/63   Pulse 86   Temp 97.9 F (36.6 C) (Temporal)   Ht 5' 6 (1.676 m)   Wt 217 lb (98.4 kg)   SpO2 97%   BMI 35.02 kg/m         Assessment & Plan:   [name] comes in today with chief  complaint of Medical Management of Chronic Issues   Diagnosis and orders addressed:  1. Essential hypertension, benign Low sodium diet - losartan  (COZAAR ) 25 MG tablet; Take 1 tablet (25 mg total) by mouth daily.  Dispense: 90 tablet; Refill: 1 - CBC  with Differential/Platelet - CMP14+EGFR  2. Late effect of cerebrovascular accident (CVA) No permanent effects - clopidogrel  (PLAVIX ) 75 MG tablet; Take 1 tablet (75 mg total) by mouth daily.  Dispense: 90 tablet; Refill: 1  3. Hypothyroidism due to acquired atrophy of thyroid  Labs pending - levothyroxine  (SYNTHROID ) 50 MCG tablet; Take 1 tablet (50 mcg total) by mouth daily.  Dispense: 90 tablet; Refill: 1 - Thyroid  Panel With TSH  4. Mixed hyperlipidemia Low fat diet - atorvastatin  (LIPITOR) 40 MG tablet; Take 1 tablet (40 mg total) by mouth daily.  Dispense: 90 tablet; Refill: 1 - Lipid panel  5. Stage 3a chronic kidney disease (HCC) Labs  pending  6. Peripheral edema Elevate legs when sitting - furosemide  (LASIX ) 40 MG tablet; Take 1 tablet (40 mg total) by mouth daily.  Dispense: 90 tablet; Refill: 1  7. BMI 37.0-37.9, adult Discussed diet and exercise for person with BMI >25 Will recheck weight in 3-6 months    Labs pending Health Maintenance reviewed Diet and exercise encouraged  Follow up plan: 6 months   Mary-Margaret Gladis, FNP

## 2024-03-03 NOTE — Patient Instructions (Signed)
 Actinic Keratosis An actinic keratosis is a precancerous growth on the skin. If there is more than one, the condition is called actinic keratoses. These growths appear most often on parts of the skin that get a lot of sun exposure, including the: Scalp. Face. Ears. Lips. Upper back. Forearms. Backs of the hands. If left untreated, these growths may develop into a skin cancer called squamous cell carcinoma. It is important to have all these growths checked by a health care provider to determine the best treatment. What are the causes? Actinic keratoses are caused by getting too much ultraviolet (UV) radiation from the sun or other UV light sources. What increases the risk? You are more likely to develop this condition if you: Have light-colored skin or blue eyes. Have blond or red hair. Spend a lot of time in the sun. Do not protect your skin from the sun when outdoors. Are an older person. The risk of developing an actinic keratosis increases with age. What are the signs or symptoms? These growths feel like scaly, rough spots of skin. Symptoms of this condition include growths that may: Be as small as a pinhead or as big as a quarter. Itch, hurt, or feel sensitive. Be skin-colored, light tan, dark tan, pink, or a combination of these colors. In most cases, the growths become red. Have a small piece of pink or gray skin (skin tag) growing from them. It may be easier to notice the growths by feeling them rather than seeing them. Sometimes, actinic keratoses disappear but may return a few days to a few weeks later. How is this diagnosed? This condition is usually diagnosed with a physical exam. A tissue sample may be removed from the growth and examined under a microscope (biopsy). How is this treated? This condition may be treated by: Scraping off the actinic keratosis (curettage). Freezing the actinic keratosis with liquid nitrogen (cryosurgery). This causes the growth to eventually  fall off. Applying medicated creams or gels to destroy the cells in the growth. Applying chemicals to the growth to make the outer layers of skin peel off (chemical peel). Using photodynamic therapy. In this procedure, medicated cream is applied to the actinic keratosis. This cream increases your skin's sensitivity to light. Then, a strong light is aimed at the actinic keratosis to destroy cells in the growth. Follow these instructions at home: Skin care Apply cool, wet cloths (coolcompresses) to the affected areas. Do not scratch your skin. Check your skin regularly for any growths, especially ones that: Start to itch or bleed. Change in size, shape, or color. Caring for the treated area Keep the treated area clean and dry as told by your health care provider. Do not apply any medicine, cream, or lotion to the treated area unless your health care provider tells you to do that. Do not pick at blisters or try to break them open. This can cause infection and scarring. If you have red or irritated skin after treatment, follow instructions from your health care provider about how to take care of the treated area. Make sure you: Wash your hands with soap and water for at least 20 seconds before and after you change your bandage (dressing). If soap and water are not available, use hand sanitizer. Change your dressing as told by your health care provider. If you have red or irritated skin after treatment, check the treated area every day for signs of infection. Check for: Redness, swelling, or pain. Fluid or blood. Warmth. Pus or a bad  smell. Lifestyle Do not use any products that contain nicotine or tobacco. These products include cigarettes, chewing tobacco, and vaping devices, such as e-cigarettes. If you need help quitting, ask your health care provider. Take steps to protect your skin from the sun, such as: Avoiding the sun between 10:00 a.m. and 4:00 p.m. This is when the UV light is the  strongest. Using a sunscreen or sunblock with SPF 30 or greater. Applying sunscreen before you are exposed to sunlight and reapplying as often as told by the instructions on the sunscreen container. Wearing protective gear, including: Sunglasses with UV protection. A hat and clothing that protect your skin from sunlight. Avoiding medicines that increase your sensitivity to sunlight when possible. Avoidingtanning beds and other indoor tanning devices. General instructions Take or apply over-the-counter and prescription medicines only as told by your health care provider. Return to your normal activities as told by your health care provider. Ask your health care provider what activities are safe for you. Have a skin exam done every year by a health care provider who is a skin specialist (dermatologist). Keep all follow-up visits. Your health care provider will want to check that the site has healed after treatment. Contact a health care provider if: You notice any changes or new growths on your skin. You have swelling, pain, or redness around your treated area. You have fluid or blood coming from your treated area. Your treated area feels warm to the touch. You have pus or a bad smell coming from your treated area. You have a fever or chills. You have a blister that becomes large and painful. Summary An actinic keratosis is a precancerous growth on the skin.If left untreated, these growths can develop into skin cancer. Check your skin regularly for any growths, especially growths that start to itch or bleed, or change in size, shape, or color. Take steps to protect your skin from the sun. Contact a health care provider if you notice any changes or new growths on your skin. This information is not intended to replace advice given to you by your health care provider. Make sure you discuss any questions you have with your health care provider. Document Revised: 08/21/2021 Document Reviewed:  08/21/2021 Elsevier Patient Education  2024 [employer].

## 2024-03-08 ENCOUNTER — Ambulatory Visit

## 2024-03-15 ENCOUNTER — Ambulatory Visit

## 2024-03-17 ENCOUNTER — Ambulatory Visit (INDEPENDENT_AMBULATORY_CARE_PROVIDER_SITE_OTHER): Admitting: *Deleted

## 2024-03-17 DIAGNOSIS — E538 Deficiency of other specified B group vitamins: Secondary | ICD-10-CM

## 2024-03-17 NOTE — Progress Notes (Signed)
 Patient is in office today for a nurse visit for B12 Injection. Patient Injection was given in the  Left deltoid. Patient tolerated injection well.

## 2024-03-31 ENCOUNTER — Ambulatory Visit

## 2024-04-05 ENCOUNTER — Ambulatory Visit

## 2024-04-06 ENCOUNTER — Ambulatory Visit

## 2024-04-06 DIAGNOSIS — E538 Deficiency of other specified B group vitamins: Secondary | ICD-10-CM | POA: Diagnosis not present

## 2024-04-06 NOTE — Progress Notes (Signed)
 Patient is in office today for a nurse visit for B12 Injection. Patient Injection was given in the  Right deltoid. Patient tolerated injection well.

## 2024-04-24 ENCOUNTER — Ambulatory Visit: Payer: Self-pay | Admitting: *Deleted

## 2024-04-24 DIAGNOSIS — E538 Deficiency of other specified B group vitamins: Secondary | ICD-10-CM

## 2024-04-24 NOTE — Progress Notes (Signed)
 Patient is in office today for a nurse visit for B12 Injection. Patient Injection was given in the  Left deltoid. Patient tolerated injection well.

## 2024-04-26 DIAGNOSIS — H353132 Nonexudative age-related macular degeneration, bilateral, intermediate dry stage: Secondary | ICD-10-CM | POA: Diagnosis not present

## 2024-04-28 ENCOUNTER — Ambulatory Visit: Payer: Self-pay | Admitting: Nurse Practitioner

## 2024-04-28 ENCOUNTER — Encounter: Payer: Self-pay | Admitting: Nurse Practitioner

## 2024-04-28 VITALS — BP 123/73 | HR 72 | Temp 97.0°F | Ht 66.0 in | Wt 214.0 lb

## 2024-04-28 DIAGNOSIS — E034 Atrophy of thyroid (acquired): Secondary | ICD-10-CM | POA: Diagnosis not present

## 2024-04-28 DIAGNOSIS — Z8673 Personal history of transient ischemic attack (TIA), and cerebral infarction without residual deficits: Secondary | ICD-10-CM

## 2024-04-28 DIAGNOSIS — M1711 Unilateral primary osteoarthritis, right knee: Secondary | ICD-10-CM | POA: Diagnosis not present

## 2024-04-28 DIAGNOSIS — R600 Localized edema: Secondary | ICD-10-CM

## 2024-04-28 DIAGNOSIS — M1712 Unilateral primary osteoarthritis, left knee: Secondary | ICD-10-CM

## 2024-04-28 DIAGNOSIS — E782 Mixed hyperlipidemia: Secondary | ICD-10-CM | POA: Diagnosis not present

## 2024-04-28 DIAGNOSIS — Z6837 Body mass index (BMI) 37.0-37.9, adult: Secondary | ICD-10-CM | POA: Diagnosis not present

## 2024-04-28 DIAGNOSIS — N1831 Chronic kidney disease, stage 3a: Secondary | ICD-10-CM | POA: Diagnosis not present

## 2024-04-28 DIAGNOSIS — I10 Essential (primary) hypertension: Secondary | ICD-10-CM | POA: Diagnosis not present

## 2024-04-28 LAB — LIPID PANEL

## 2024-04-28 MED ORDER — ATORVASTATIN CALCIUM 20 MG PO TABS: 20.0000 mg | ORAL_TABLET | Freq: Every day | ORAL | 1 refills | Status: AC

## 2024-04-28 MED ORDER — LEVOTHYROXINE SODIUM 50 MCG PO TABS: 50.0000 ug | ORAL_TABLET | Freq: Every day | ORAL | 1 refills | Status: AC

## 2024-04-28 MED ORDER — LOSARTAN POTASSIUM 25 MG PO TABS: 25.0000 mg | ORAL_TABLET | Freq: Every day | ORAL | 1 refills | Status: AC

## 2024-04-28 MED ORDER — FUROSEMIDE 40 MG PO TABS
40.0000 mg | ORAL_TABLET | Freq: Every day | ORAL | 1 refills | Status: AC
Start: 2024-04-28 — End: ?

## 2024-04-28 MED ORDER — CLOPIDOGREL BISULFATE 75 MG PO TABS: 75.0000 mg | ORAL_TABLET | Freq: Every day | ORAL | 1 refills | Status: AC

## 2024-04-28 NOTE — Patient Instructions (Signed)
 Fall Prevention in the Home, Adult Falls can cause injuries and can happen to people of all ages. There are many things you can do to make your home safer and to help prevent falls. What actions can I take to prevent falls? General information Use good lighting in all rooms. Make sure to: Replace any light bulbs that burn out. Turn on the lights in dark areas and use night-lights. Keep items that you use often in easy-to-reach places. Lower the shelves around your home if needed. Move furniture so that there are clear paths around it. Do not use throw rugs or other things on the floor that can make you trip. If any of your floors are uneven, fix them. Add color or contrast paint or tape to clearly mark and help you see: Grab bars or handrails. First and last steps of staircases. Where the edge of each step is. If you use a ladder or stepladder: Make sure that it is fully opened. Do not climb a closed ladder. Make sure the sides of the ladder are locked in place. Have someone hold the ladder while you use it. Know where your pets are as you move through your home. What can I do in the bathroom?     Keep the floor dry. Clean up any water on the floor right away. Remove soap buildup in the bathtub or shower. Buildup makes bathtubs and showers slippery. Use non-skid mats or decals on the floor of the bathtub or shower. Attach bath mats securely with double-sided, non-slip rug tape. If you need to sit down in the shower, use a non-slip stool. Install grab bars by the toilet and in the bathtub and shower. Do not use towel bars as grab bars. What can I do in the bedroom? Make sure that you have a light by your bed that is easy to reach. Do not use any sheets or blankets on your bed that hang to the floor. Have a firm chair or bench with side arms that you can use for support when you get dressed. What can I do in the kitchen? Clean up any spills right away. If you need to reach something  above you, use a step stool with a grab bar. Keep electrical cords out of the way. Do not use floor polish or wax that makes floors slippery. What can I do with my stairs? Do not leave anything on the stairs. Make sure that you have a light switch at the top and the bottom of the stairs. Make sure that there are handrails on both sides of the stairs. Fix handrails that are broken or loose. Install non-slip stair treads on all your stairs if they do not have carpet. Avoid having throw rugs at the top or bottom of the stairs. Choose a carpet that does not hide the edge of the steps on the stairs. Make sure that the carpet is firmly attached to the stairs. Fix carpet that is loose or worn. What can I do on the outside of my home? Use bright outdoor lighting. Fix the edges of walkways and driveways and fix any cracks. Clear paths of anything that can make you trip, such as tools or rocks. Add color or contrast paint or tape to clearly mark and help you see anything that might make you trip as you walk through a door, such as a raised step or threshold. Trim any bushes or trees on paths to your home. Check to see if handrails are loose  or broken and that both sides of all steps have handrails. Install guardrails along the edges of any raised decks and porches. Have leaves, snow, or ice cleared regularly. Use sand, salt, or ice melter on paths if you live where there is ice and snow during the winter. Clean up any spills in your garage right away. This includes grease or oil spills. What other actions can I take? Review your medicines with your doctor. Some medicines can cause dizziness or changes in blood pressure, which increase your risk of falling. Wear shoes that: Have a low heel. Do not wear high heels. Have rubber bottoms and are closed at the toe. Feel good on your feet and fit well. Use tools that help you move around if needed. These include: Canes. Walkers. Scooters. Crutches. Ask  your doctor what else you can do to help prevent falls. This may include seeing a physical therapist to learn to do exercises to move better and get stronger. Where to find more information Centers for Disease Control and Prevention, [employer]: TonerPromos.no [employer] on Aging: BaseRingTones.pl National Institute on Aging: BaseRingTones.pl Contact a doctor if: You are afraid of falling at home. You feel weak, drowsy, or dizzy at home. You fall at home. Get help right away if you: Lose consciousness or have trouble moving after a fall. Have a fall that causes a head injury. These symptoms may be an emergency. Get help right away. Call 911. Do not wait to see if the symptoms will go away. Do not drive yourself to the hospital. This information is not intended to replace advice given to you by your health care provider. Make sure you discuss any questions you have with your health care provider. Document Revised: 02/09/2022 Document Reviewed: 02/09/2022 Elsevier Patient Education  2024 [employer].

## 2024-04-28 NOTE — Progress Notes (Signed)
 Subjective:    Patient ID: Kimberly Walsh, female    DOB: 1946-06-03, 78 y.o.   MRN: [id number]   Chief Complaint: medical management of chronic issues     HPI:  Kimberly Walsh is a 78 y.o. who identifies as a female who was assigned female at birth.   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 Essential hypertension, benign No c/o chest pain sob or headache. Doe snot check blood pressure at home. BP Readings from Last 3 Encounters:  03/03/24 (!) 142/63  11/02/23 119/63  05/04/23 135/77     2. history of cerebrovascular accident (CVA) No permanent effects  3. Hypothyroidism due to acquired atrophy of thyroid  No issues that she is aware of Lab Results  Component Value Date   TSH 2.150 11/02/2023     4. Mixed hyperlipidemia Does not watch diet and does no dedicated exercises Lab Results  Component Value Date   CHOL 107 11/02/2023   HDL 38 (L) 11/02/2023   LDLCALC 54 11/02/2023   TRIG 71 11/02/2023   CHOLHDL 2.8 11/02/2023      5. Stage 3a chronic kidney disease (HCC) No voiding issues Lab Results  Component Value Date   CREATININE 1.05 (H) 11/02/2023     6. Peripheral edema Has bil lower ext edema daily  7. Osteoarthritis left knee 8. Osteoarthritis right knee Had bil  knee replacements and is doing well.  9. BMI 37.0-37.9, adult Weight is up 13lbs  Wt Readings from Last 3 Encounters:  04/28/24 214 lb (97.1 kg)  03/03/24 217 lb (98.4 kg)  11/02/23 220 lb (99.8 kg)   BMI Readings from Last 3 Encounters:  04/28/24 34.54 kg/m  03/03/24 35.02 kg/m  11/02/23 35.51 kg/m        New complaints: None today  Allergies  Allergen Reactions   Ace Inhibitors Cough   Outpatient Encounter Medications as of 04/28/2024  Medication Sig   acetaminophen  (TYLENOL ) 500 MG tablet Take 1,000 mg by mouth every 8 (eight) hours as  needed for moderate pain.   atorvastatin  (LIPITOR) 20 MG tablet Take 20 mg by mouth daily.   clopidogrel  (PLAVIX ) 75 MG tablet Take 1 tablet (75 mg total) by mouth daily.   furosemide  (LASIX ) 40 MG tablet Take 1 tablet (40 mg total) by mouth daily.   levothyroxine  (SYNTHROID ) 50 MCG tablet Take 1 tablet (50 mcg total) by mouth daily.   losartan  (COZAAR ) 25 MG tablet Take 1 tablet (25 mg total) by mouth daily.   meclizine  (ANTIVERT ) 25 MG tablet TAKE 1 TABLET BY MOUTH EVERY 6 HOURS AS NEEDED FOR VERTIGO   methocarbamol  (ROBAXIN ) 500 MG tablet Take 1 tablet (500 mg total) by mouth every 6 (six) hours as needed for muscle spasms.   ondansetron  (ZOFRAN ) 4 MG tablet Take 1 tablet (4 mg total) by mouth every 6 (six) hours as needed for nausea.   Facility-Administered Encounter Medications as of 04/28/2024  Medication   cyanocobalamin  (VITAMIN B12) injection 1,000 mcg    Past Surgical History:  Procedure Laterality Date   basal carcinoma rt leg     CHOLECYSTECTOMY     FINGER SURGERY Left    Ring finger   MIDDLE EAR SURGERY     Fungus   TOTAL KNEE ARTHROPLASTY Right 12/14/2022   Procedure: TOTAL KNEE ARTHROPLASTY;  Surgeon: Melodi Lerner, MD;  Location: [facility];  Service: Orthopedics;  Laterality: Right;   TOTAL KNEE ARTHROPLASTY Left 03/29/2023   Procedure: LEFT TOTAL KNEE ARTHROPLASTY;  Surgeon: Melodi Lerner, MD;  Location: [facility];  Service: Orthopedics;  Laterality: Left;   TUBAL LIGATION     VEIN SURGERY      Family History  Problem Relation Age of Onset   COPD Father       Controlled substance contract: n/a     Review of Systems  Constitutional:  Negative for diaphoresis.  Eyes:  Negative for pain.  Respiratory:  Negative for shortness of breath.   Cardiovascular:  Negative for chest pain, palpitations and leg swelling.  Gastrointestinal:  Negative for abdominal pain.  Endocrine: Negative for polydipsia.  Skin:  Negative for rash.  Neurological:  Negative for  dizziness, weakness and headaches.  Hematological:  Does not bruise/bleed easily.  All other systems reviewed and are negative.      Objective:   Physical Exam Vitals and nursing note reviewed.  Constitutional:      General: She is not in acute distress.    Appearance: Normal appearance. She is well-developed.  HENT:     Head: Normocephalic.     Right Ear: Tympanic membrane normal.     Left Ear: Tympanic membrane normal.     Nose: Nose normal.     Mouth/Throat:     Mouth: Mucous membranes are moist.  Eyes:     Pupils: Pupils are equal, round, and reactive to light.  Neck:     Vascular: No carotid bruit or JVD.  Cardiovascular:     Rate and Rhythm: Normal rate and regular rhythm.     Heart sounds: Murmur (1/6 systolic murmur) heard.  Pulmonary:     Effort: Pulmonary effort is normal. No respiratory distress.     Breath sounds: Normal breath sounds. No wheezing or rales.  Chest:     Chest wall: No tenderness.  Abdominal:     General: Bowel sounds are normal. There is no distension or abdominal bruit.     Palpations: Abdomen is soft. There is no hepatomegaly, splenomegaly, mass or pulsatile mass.     Tenderness: There is no abdominal tenderness.  Musculoskeletal:        General: Normal range of motion.     Cervical back: Normal range of motion and neck supple.     Right lower leg: Edema (1+) present.     Left lower leg: No edema.  Lymphadenopathy:     Cervical: No cervical adenopathy.  Skin:    General: Skin is warm and dry.  Neurological:     Mental Status: She is alert and oriented to person, place, and time.     Deep Tendon Reflexes: Reflexes are normal and symmetric.  Psychiatric:        Behavior: Behavior normal.        Thought Content: Thought content normal.        Judgment: Judgment normal.     BP 123/73   Pulse 72   Temp (!) 97 F (36.1 C) (Temporal)   Ht 5' 6 (1.676 m)   Wt 214 lb (97.1 kg)   SpO2 98%   BMI 34.54 kg/m          Assessment &  Plan:   Kimberly Walsh comes in today with chief complaint of medical management of chronic issues    Diagnosis and orders addressed:  1. Essential hypertension, benign Low sodium diet - losartan  (COZAAR ) 25 MG tablet; Take 1 tablet (25 mg total) by  mouth daily.  Dispense: 90 tablet; Refill: 1 - CBC with Differential/Platelet - CMP14+EGFR  2. Late effect of cerebrovascular accident (CVA) No permanent effects - clopidogrel  (PLAVIX ) 75 MG tablet; Take 1 tablet (75 mg total) by mouth daily.  Dispense: 90 tablet; Refill: 1  3. Hypothyroidism due to acquired atrophy of thyroid  Labs pending - levothyroxine  (SYNTHROID ) 50 MCG tablet; Take 1 tablet (50 mcg total) by mouth daily.  Dispense: 90 tablet; Refill: 1 - Thyroid  Panel With TSH  4. Mixed hyperlipidemia Low fat diet - atorvastatin  (LIPITOR) 40 MG tablet; Take 1 tablet (40 mg total) by mouth daily.  Dispense: 90 tablet; Refill: 1 - Lipid panel  5. Stage 3a chronic kidney disease (HCC) Labs  pending  6. Peripheral edema Elevate legs when sitting - furosemide  (LASIX ) 40 MG tablet; Take 1 tablet (40 mg total) by mouth daily.  Dispense: 90 tablet; Refill: 1  7. BMI 37.0-37.9, adult Discussed diet and exercise for person with BMI >25 Will recheck weight in 3-6 months  8. Osteoarthritis left knee 9. Osteoarthritis right knee Exercise daily   Labs pending Health Maintenance reviewed Diet and exercise encouraged  Follow up plan: 6 months   Mary-Margaret Gladis, FNP

## 2024-04-29 LAB — CBC WITH DIFFERENTIAL/PLATELET
Basophils Absolute: 0.1 x10E3/uL (ref 0.0–0.2)
Basos: 1 %
EOS (ABSOLUTE): 0.1 x10E3/uL (ref 0.0–0.4)
Eos: 2 %
Hematocrit: 43.1 % (ref 34.0–46.6)
Hemoglobin: 14.1 g/dL (ref 11.1–15.9)
Immature Grans (Abs): 0 x10E3/uL (ref 0.0–0.1)
Immature Granulocytes: 0 %
Lymphocytes Absolute: 1.4 x10E3/uL (ref 0.7–3.1)
Lymphs: 25 %
MCH: 29.6 pg (ref 26.6–33.0)
MCHC: 32.7 g/dL (ref 31.5–35.7)
MCV: 90 fL (ref 79–97)
Monocytes Absolute: 0.3 x10E3/uL (ref 0.1–0.9)
Monocytes: 6 %
Neutrophils Absolute: 3.6 x10E3/uL (ref 1.4–7.0)
Neutrophils: 66 %
Platelets: 132 x10E3/uL — ABNORMAL LOW (ref 150–450)
RBC: 4.77 x10E6/uL (ref 3.77–5.28)
RDW: 13.8 % (ref 11.7–15.4)
WBC: 5.5 x10E3/uL (ref 3.4–10.8)

## 2024-04-29 LAB — LIPID PANEL
Chol/HDL Ratio: 2.7 ratio (ref 0.0–4.4)
Cholesterol, Total: 116 mg/dL (ref 100–199)
HDL: 43 mg/dL (ref >39)
LDL Chol Calc (NIH): 55 mg/dL (ref 0–99)
Triglycerides: 95 mg/dL (ref 0–149)
VLDL Cholesterol Cal: 18 mg/dL (ref 5–40)

## 2024-04-29 LAB — CMP14+EGFR
ALT: 15 IU/L (ref 0–32)
AST: 23 IU/L (ref 0–40)
Albumin: 4.2 g/dL (ref 3.8–4.8)
Alkaline Phosphatase: 131 IU/L (ref 49–135)
BUN/Creatinine Ratio: 16 (ref 12–28)
BUN: 20 mg/dL (ref 8–27)
Bilirubin Total: 0.9 mg/dL (ref 0.0–1.2)
CO2: 25 mmol/L (ref 20–29)
Calcium: 9.5 mg/dL (ref 8.7–10.3)
Chloride: 104 mmol/L (ref 96–106)
Creatinine, Ser: 1.24 mg/dL — ABNORMAL HIGH (ref 0.57–1.00)
Globulin, Total: 2.8 g/dL (ref 1.5–4.5)
Glucose: 89 mg/dL (ref 70–99)
Potassium: 4.4 mmol/L (ref 3.5–5.2)
Sodium: 140 mmol/L (ref 134–144)
Total Protein: 7 g/dL (ref 6.0–8.5)
eGFR: 45 mL/min/1.73 — ABNORMAL LOW (ref >59)

## 2024-04-29 LAB — THYROID PANEL WITH TSH
Free Thyroxine Index: 2.2 (ref 1.2–4.9)
T3 Uptake Ratio: 32 % (ref 24–39)
T4, Total: 6.9 ug/dL (ref 4.5–12.0)
TSH: 3.62 u[IU]/mL (ref 0.450–4.500)

## 2024-05-01 ENCOUNTER — Ambulatory Visit: Payer: Self-pay | Admitting: Nurse Practitioner

## 2024-05-01 DIAGNOSIS — H353132 Nonexudative age-related macular degeneration, bilateral, intermediate dry stage: Secondary | ICD-10-CM | POA: Diagnosis not present

## 2024-05-08 ENCOUNTER — Ambulatory Visit: Payer: Self-pay | Admitting: *Deleted

## 2024-05-08 DIAGNOSIS — E538 Deficiency of other specified B group vitamins: Secondary | ICD-10-CM | POA: Diagnosis not present

## 2024-05-08 NOTE — Progress Notes (Signed)
 Patient is in office today for a nurse visit for B12 Injection. Patient Injection was given in the  Right deltoid. Patient tolerated injection well.

## 2024-05-22 ENCOUNTER — Ambulatory Visit (INDEPENDENT_AMBULATORY_CARE_PROVIDER_SITE_OTHER): Payer: Self-pay | Admitting: *Deleted

## 2024-05-22 DIAGNOSIS — E538 Deficiency of other specified B group vitamins: Secondary | ICD-10-CM

## 2024-05-22 NOTE — Progress Notes (Signed)
 Patient is in office today for a nurse visit for B12 Injection. Injection was given in the  Left deltoid. Patient tolerated injection well.

## 2024-06-01 ENCOUNTER — Other Ambulatory Visit: Payer: Self-pay

## 2024-06-01 DIAGNOSIS — L659 Nonscarring hair loss, unspecified: Secondary | ICD-10-CM

## 2024-06-05 ENCOUNTER — Ambulatory Visit: Payer: Self-pay

## 2024-06-05 DIAGNOSIS — E538 Deficiency of other specified B group vitamins: Secondary | ICD-10-CM | POA: Diagnosis not present

## 2024-06-05 NOTE — Progress Notes (Signed)
 Patient is in office today for a nurse visit for B12 Injection. Patient Injection was given in the  Right deltoid. Patient tolerated injection well.

## 2024-06-19 ENCOUNTER — Ambulatory Visit (INDEPENDENT_AMBULATORY_CARE_PROVIDER_SITE_OTHER): Payer: Self-pay

## 2024-06-19 DIAGNOSIS — E538 Deficiency of other specified B group vitamins: Secondary | ICD-10-CM

## 2024-06-19 NOTE — Progress Notes (Signed)
 Patient is in office today for a nurse visit for B12 Injection. Patient Injection was given in the  Right deltoid. Patient tolerated injection well.

## 2024-07-03 ENCOUNTER — Ambulatory Visit (INDEPENDENT_AMBULATORY_CARE_PROVIDER_SITE_OTHER): Admitting: *Deleted

## 2024-07-03 DIAGNOSIS — E538 Deficiency of other specified B group vitamins: Secondary | ICD-10-CM

## 2024-07-03 NOTE — Progress Notes (Signed)
 Patient is in office today for a nurse visit for B12 Injection. Patient Injection was given in the  Right deltoid. Patient tolerated injection well.

## 2024-07-17 ENCOUNTER — Ambulatory Visit

## 2024-07-18 ENCOUNTER — Ambulatory Visit

## 2024-07-18 ENCOUNTER — Other Ambulatory Visit: Payer: Self-pay | Admitting: Nurse Practitioner

## 2024-07-18 DIAGNOSIS — R42 Dizziness and giddiness: Secondary | ICD-10-CM

## 2024-07-20 ENCOUNTER — Ambulatory Visit

## 2024-07-20 DIAGNOSIS — E538 Deficiency of other specified B group vitamins: Secondary | ICD-10-CM

## 2024-08-03 ENCOUNTER — Ambulatory Visit

## 2024-10-23 ENCOUNTER — Ambulatory Visit: Admitting: Nurse Practitioner
